# Patient Record
Sex: Female | Born: 1995 | Race: Black or African American | Hispanic: No | Smoking: Never smoker
Health system: Southern US, Community
[De-identification: ages and names within clinical notes are randomized; demographics above are authoritative.]

## PROBLEM LIST (undated history)

## (undated) DIAGNOSIS — D649 Anemia, unspecified: Secondary | ICD-10-CM

## (undated) DIAGNOSIS — K5090 Crohn's disease, unspecified, without complications: Secondary | ICD-10-CM

## (undated) DIAGNOSIS — U071 COVID-19: Secondary | ICD-10-CM

## (undated) DIAGNOSIS — E282 Polycystic ovarian syndrome: Secondary | ICD-10-CM

## (undated) HISTORY — PX: COLOSTOMY: SHX63

## (undated) HISTORY — PX: APPENDECTOMY: SHX54

## (undated) HISTORY — PX: CHOLECYSTECTOMY: SHX55

## (undated) HISTORY — PX: COLECTOMY: SHX59

## (undated) HISTORY — PX: COLOSTOMY REVERSAL: SHX5782

---
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
## 2014-09-30 DIAGNOSIS — J45909 Unspecified asthma, uncomplicated: Secondary | ICD-10-CM | POA: Insufficient documentation

## 2015-07-23 ENCOUNTER — Encounter: Payer: Self-pay | Admitting: *Deleted

## 2015-07-23 ENCOUNTER — Emergency Department
Admission: EM | Admit: 2015-07-23 | Discharge: 2015-07-23 | Disposition: A | Discharge status: Home / Self Care | Payer: Self-pay | Attending: Emergency Medicine | Admitting: Emergency Medicine

## 2015-07-23 DIAGNOSIS — K0889 Other specified disorders of teeth and supporting structures: Secondary | ICD-10-CM | POA: Insufficient documentation

## 2015-07-23 DIAGNOSIS — K029 Dental caries, unspecified: Secondary | ICD-10-CM | POA: Insufficient documentation

## 2015-07-23 HISTORY — DX: Crohn's disease, unspecified, without complications: K50.90

## 2015-07-23 MED ORDER — AMOXICILLIN 500 MG PO CAPS
500.0000 mg | ORAL_CAPSULE | Freq: Once | ORAL | Status: AC
  Administered 2015-07-23: 500 mg via ORAL
  Filled 2015-07-23: qty 1

## 2015-07-23 MED ORDER — TRAMADOL HCL 50 MG PO TABS: 50.0000 mg | ORAL_TABLET | Freq: Four times a day (QID) | ORAL | Status: DC | PRN

## 2015-07-23 MED ORDER — HYDROCODONE-ACETAMINOPHEN 5-325 MG PO TABS
1.0000 | ORAL_TABLET | Freq: Once | ORAL | Status: AC
  Administered 2015-07-23: 1 via ORAL
  Filled 2015-07-23: qty 1

## 2015-07-23 MED ORDER — AMOXICILLIN 500 MG PO TABS: 500.0000 mg | ORAL_TABLET | Freq: Three times a day (TID) | ORAL | Status: DC

## 2015-07-23 NOTE — ED Notes (Signed)
Pt reports dental pain x 4 days. Left side.

## 2015-07-23 NOTE — ED Provider Notes (Signed)
[facility] Emergency Department Provider Note  ____________________________________________  Time seen: Approximately 11:12 PM  I have reviewed the triage vital signs and the nursing notes.   HISTORY  Chief Complaint Dental Pain    HPI Sandra Wilkerson is a 19 y.o. female who presents to the emergency department for evaluation of dental pain. She states that she noticed the area becoming painful 3 days ago and it has then progressively worsening. She states that she feels that her lower jaw is swelling.She denies fever.   Past Medical History  Diagnosis Date  . Crohn disease (HCC)     There are no active problems to display for this patient.   Past Surgical History  Procedure Laterality Date  . Colectomy    . Appendectomy    . Colostomy    . Colostomy reversal      Current Outpatient Rx  Name  Route  Sig  Dispense  Refill  . amoxicillin (AMOXIL) 500 MG tablet   Oral   Take 1 tablet (500 mg total) by mouth 3 (three) times daily.   30 tablet   0   . traMADol (ULTRAM) 50 MG tablet   Oral   Take 1 tablet (50 mg total) by mouth every 6 (six) hours as needed.   9 tablet   0     Allergies Review of patient's allergies indicates no known allergies.  No family history on file.  Social History Social History  Substance Use Topics  . Smoking status: Never Smoker   . Smokeless tobacco: None  . Alcohol Use: No    Review of Systems Constitutional: No fever/chills Eyes: No visual changes. ENT: No sore throat. Cardiovascular: Denies chest pain. Respiratory: Denies shortness of breath. Gastrointestinal: No abdominal pain.  No nausea, no vomiting.  Genitourinary: Negative for dysuria. Musculoskeletal: Negative for back pain. Skin: Negative for rash. Neurological: Negative for headaches, focal weakness or numbness. 10-point ROS otherwise negative.  ____________________________________________   PHYSICAL EXAM:  VITAL SIGNS: ED  Triage Vitals  Enc Vitals Group     BP 07/23/15 2220 130/77 mmHg     Pulse Rate 07/23/15 2220 66     Resp 07/23/15 2220 16     Temp 07/23/15 2220 98.7 F (37.1 C)     Temp Source 07/23/15 2220 Oral     SpO2 07/23/15 2220 97 %     Weight 07/23/15 2220 160 lb (72.576 kg)     Height 07/23/15 2220  (1.499 m)     Head Cir --      Peak Flow --      Pain Score 07/23/15 2218 7     Pain Loc --      Pain Edu? --      Excl. in GC? --     Constitutional: Alert and oriented. Well appearing and in no acute distress. Eyes: Conjunctivae are normal. PERRL. EOMI. Head: Atraumatic. Nose: No congestion/rhinnorhea. Mouth/Throat: Mucous membranes are moist.  Oropharynx non-erythematous. Periodontal Exam    Neck: No stridor.  Hematological/Lymphatic/Immunilogical: No cervical lymphadenopathy. Cardiovascular:   Good peripheral circulation. Respiratory: Normal respiratory effort.  No retractions. Musculoskeletal: No lower extremity tenderness nor edema.  No joint effusions. Neurologic:  Normal speech and language. No gross focal neurologic deficits are appreciated. Speech is normal. No gait instability. Skin:  Skin is warm, dry and intact. No rash noted. Psychiatric: Mood and affect are normal. Speech and behavior are normal.  ____________________________________________   LABS (all labs ordered are listed, but  only abnormal results are displayed)  Labs Reviewed - No data to display ____________________________________________   RADIOLOGY   ____________________________________________   PROCEDURES  Procedure(s) performed: None  Critical Care performed: No  ____________________________________________   INITIAL IMPRESSION / ASSESSMENT AND PLAN / ED COURSE  Pertinent labs & imaging results that were available during my care of the patient were reviewed by me and considered in my medical decision making (see chart for details).  Patient was advised to see the dentist  within 14 days. Also advised to take the antibiotic until finished. Instructed to return to the ER for symptoms that change or worsen if you are unable to schedule an appointment. ____________________________________________   FINAL CLINICAL IMPRESSION(S) / ED DIAGNOSES  Final diagnoses:  Pain, dental       Chinita Pester, FNP 07/23/15 1610  Loleta Rose, MD 07/25/15 [phone]

## 2015-07-23 NOTE — ED Notes (Signed)
Pt was taking alieve 200 mg for pain. Lasted for the past 3 days

## 2015-07-23 NOTE — ED Notes (Signed)
pts left rear gum area is swollen for the last 3 days

## 2015-07-23 NOTE — Discharge Instructions (Signed)
OPTIONS FOR DENTAL FOLLOW UP CARE ° °[place] Department of Health and Human Services - Local Safety Net Dental Clinics °http://www.ncdhhs.gov/dph/oralhealth/services/safetynetclinics.htm °  °[facility] (336-562-3123) ° °[facility] [place] (919-933-9087) ° °[facility] (919-663-1744 ext [phone]) ° °[facility] ([phone]) ° °[facility] ([phone]) °This clinic caters to the indigent population and is on a lottery system. °Location: °[facility], [address], [place] °Clinic Hours: °Wednesdays from 6pm - 9pm, patients seen by a lottery system. °For dates, call or go to www.med.[facility].edu/[facility]/patients/Dental-[facility] °Services: °Cleanings, fillings and simple extractions. °Payment Options: °DENTAL WORK IS FREE OF CHARGE. Bring proof of income or support. °Best way to get seen: °Arrive at 5:15 pm - this is a lottery, NOT first come/first serve, so arriving earlier will not increase your chances of being seen. °  °  °[facility] °[phone] °Select option 1 for emergencies °  °Location: °[facility], [address], [place] °Clinic Hours: °No walk-ins accepted - call the day before to schedule an appointment. °Check in times are 9:30 am and 1:30 pm. °Services: °Simple extractions, temporary fillings, pulpectomy/pulp debridement, uncomplicated abscess drainage. °Payment Options: °PAYMENT IS DUE AT THE TIME OF SERVICE.  Fee is usually $100-200, additional surgical procedures (e.g. abscess drainage) may be extra. °Cash, checks, Visa/MasterCard accepted.  Can file Medicaid if patient is covered for dental - patient should call case worker to check. °No discount for [facility] patients. °Best way to get seen: °MUST call the day before and get onto the schedule. Can usually be seen the next 1-2 days. No walk-ins accepted. °  °  °[facility] °[phone] °   °Location: °[facility], [address], [place] °Clinic Hours: °M, W, Th, F 8am or 1:30pm, Tues 9a or 1:30 - first come/first served. °Services: °Simple extractions, temporary fillings, uncomplicated abscess drainage.  You do not need to be an [place] resident. °Payment Options: °PAYMENT IS DUE AT THE TIME OF SERVICE. °Dental insurance, otherwise sliding scale - bring proof of income or support. °Depending on income and treatment needed, cost is usually $50-200. °Best way to get seen: °Arrive early as it is first come/first served. °  °  °[facility] °[phone] °  °Location: °[address] °Clinic Hours: °Mon-Thu 8a-5p °Services: °Most basic dental services including extractions and fillings. °Payment Options: °PAYMENT IS DUE AT THE TIME OF SERVICE. °Sliding scale, up to 50% off - bring proof if income or support. °Medicaid with dental option accepted. °Best way to get seen: °Call to schedule an appointment, can usually be seen within 2 weeks OR they will try to see walk-ins - show up at 8a or 2p (you may have to wait). °  °  °[facility] °919-245-2435 °[place] RESIDENTS ONLY °  °Location: °[facility], [address], [place], [place] [address] °Clinic Hours: By appointment only. °Monday - Thursday 8am-5pm, Friday 8am-12pm °Services: Cleanings, fillings, extractions. °Payment Options: °PAYMENT IS DUE AT THE TIME OF SERVICE. °Cash, Visa or MasterCard. Sliding scale - $30 minimum per service. °Best way to get seen: °Come in to office, complete packet and make an appointment - need proof of income °or support monies for each household member and proof of [place] residence. °Usually takes about a month to get in. °  °  °[facility] °[phone] °  °Location: °[address]   [place] °Clinic Hours: Walk-in Urgent Care Dental Services are offered Monday-Friday  mornings only. °The numbers of emergencies accepted daily is limited to the number of °providers available. °Maximum 15 - Mondays, Wednesdays & Thursdays °Maximum 10 - Tuesdays & Fridays °Services: °You do not need to be a [place] resident to be seen for a dental emergency. °Emergencies are defined as pain, swelling, abnormal bleeding, or dental trauma. Walkins will receive x-rays if needed. °NOTE: Dental cleaning is not an emergency. °Payment Options: °PAYMENT IS DUE AT THE TIME OF SERVICE. °Minimum co-pay is $40.00 for uninsured patients. °Minimum co-pay is $3.00 for Medicaid with dental coverage. °Dental Insurance is accepted and must be presented at time of visit. °Medicare does not cover dental. °Forms of payment: Cash, credit card, checks. °Best way to get seen: °If not previously registered with the clinic, walk-in dental registration begins at 7:15 am and is on a first come/first serve basis. °If previously registered with the clinic, call to make an appointment. °  °  °[facility] °[phone] °[place] RESIDENTS ONLY °  °Location: °[address], [place], [place] °Clinic Hours: °Mon-Thu 10a-2p °Services: Extractions only! °Payment Options: °FREE (donations accepted) - bring proof of income or support °Best way to get seen: °Call and schedule an appointment OR come at 8am on the 1st Monday of every month (except for holidays) when it is first come/first served. °  °  °[facility] °[phone] °  °Location: °[address], [place] °Clinic Hours: °Friday mornings °Services, Payment Options, Best way to get seen: °Call for info °

## 2017-03-26 DIAGNOSIS — G4489 Other headache syndrome: Secondary | ICD-10-CM | POA: Insufficient documentation

## 2017-03-26 DIAGNOSIS — K219 Gastro-esophageal reflux disease without esophagitis: Secondary | ICD-10-CM | POA: Insufficient documentation

## 2017-09-12 DIAGNOSIS — K5010 Crohn's disease of large intestine without complications: Secondary | ICD-10-CM | POA: Insufficient documentation

## 2017-11-24 ENCOUNTER — Encounter: Payer: Self-pay | Admitting: Gynecology

## 2017-11-24 ENCOUNTER — Ambulatory Visit
Admission: EM | Admit: 2017-11-24 | Discharge: 2017-11-24 | Disposition: A | Discharge status: Home / Self Care | Payer: BLUE CROSS/BLUE SHIELD | Attending: Emergency Medicine | Admitting: Emergency Medicine

## 2017-11-24 ENCOUNTER — Other Ambulatory Visit: Payer: Self-pay

## 2017-11-24 DIAGNOSIS — J029 Acute pharyngitis, unspecified: Secondary | ICD-10-CM

## 2017-11-24 LAB — RAPID INFLUENZA A&B ANTIGENS
Influenza A (ARMC): NEGATIVE
Influenza B (ARMC): NEGATIVE

## 2017-11-24 LAB — RAPID STREP SCREEN (MED CTR MEBANE ONLY): Streptococcus, Group A Screen (Direct): NEGATIVE

## 2017-11-24 MED ORDER — AMOXICILLIN-POT CLAVULANATE 875-125 MG PO TABS: 1.0000 | ORAL_TABLET | Freq: Two times a day (BID) | ORAL | 0 refills | Status: DC

## 2017-11-24 MED ORDER — BENZONATATE 200 MG PO CAPS: ORAL_CAPSULE | ORAL | 0 refills | Status: DC

## 2017-11-24 NOTE — ED Triage Notes (Signed)
Patient c/o sore throat x 4 days. Per patient her sister at home diagnose with the flu and strep throat.

## 2017-11-24 NOTE — ED Provider Notes (Signed)
[facility]    CSN: [id number] Arrival date & time: 11/24/17  4098     History   Chief Complaint Chief Complaint  Patient presents with  . Sore Throat    HPI Sandra Wilkerson is a 22 y.o. female.   HPI  22 year old female who presents with a sore throat that she has had for 4 days.  Has a cough is nonproductive.  Prior to her present symptoms she has had nausea and vomiting that has since abated.  She lives with her sister and her children and states that they have all been diagnosed with flu or strep throat.  She was concerned that she may have also caught illness.  She did have her flu shot earlier this year.  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  Has had some difficulty in swallowing mostly last night.        Past Medical History:  Diagnosis Date  . Crohn disease (HCC)     There are no active problems to display for this patient.   Past Surgical History:  Procedure Laterality Date  . APPENDECTOMY    . COLECTOMY    . COLOSTOMY    . COLOSTOMY REVERSAL      OB History    No data available       Home Medications    Prior to Admission medications   Medication Sig Start Date End Date Taking? Authorizing Provider  abciximab 7.2 mg in sodium chloride 0.9 % 250 mL Inject into the vein continuous.   Yes [provider]  amoxicillin-clavulanate (AUGMENTIN) 875-125 MG tablet Take 1 tablet by mouth every 12 (twelve) hours. 11/24/17   Lutricia Feil, PA-C  benzonatate (TESSALON) 200 MG capsule Take one cap TID PRN cough 11/24/17   Lutricia Feil, PA-C    Family History Family History  Problem Relation Age of Onset  . Asthma Father     Social History Social History   Tobacco Use  . Smoking status: Never Smoker  . Smokeless tobacco: Never Used  Substance Use Topics  . Alcohol use: No  . Drug use: No     Allergies   Patient has no known allergies.   Review of Systems Review of Systems    Constitutional: Positive for activity change and appetite change. Negative for chills, fatigue and fever.  HENT: Positive for congestion and sore throat.   Respiratory: Positive for cough.      Physical Exam Triage Vital Signs ED Triage Vitals  Enc Vitals Group     BP 11/24/17 0927 115/75     Pulse Rate 11/24/17 0927 72     Resp 11/24/17 0927 16     Temp 11/24/17 0927 98.4 F (36.9 C)     Temp Source 11/24/17 0927 Oral     SpO2 11/24/17 0927 100 %     Weight 11/24/17 0929 179 lb (81.2 kg)     Height 11/24/17 0929 4\' 11"  (1.499 m)     Head Circumference --      Peak Flow --      Pain Score 11/24/17 0928 6     Pain Loc --      Pain Edu? --      Excl. in GC? --    No data found.  Updated Vital Signs BP 115/75 (BP Location: Left Arm)   Pulse 72   Temp 98.4 F (36.9 C) (Oral)   Resp 16   Ht 4'  11" (1.499 m)   Wt 179 lb (81.2 kg)   LMP 10/31/2017   SpO2 100%   BMI 36.15 kg/m   Visual Acuity Right Eye Distance:   Left Eye Distance:   Bilateral Distance:    Right Eye Near:   Left Eye Near:    Bilateral Near:     Physical Exam  Constitutional: She is oriented to person, place, and time. She appears well-developed and well-nourished.  Non-toxic appearance. She does not appear ill. No distress.  HENT:  Head: Normocephalic.  Right Ear: Tympanic membrane normal. No drainage, swelling or tenderness.  Left Ear: Tympanic membrane normal. No drainage, swelling or tenderness.  Mouth/Throat: Uvula is midline and mucous membranes are normal. No uvula swelling. Oropharyngeal exudate and posterior oropharyngeal edema present. No posterior oropharyngeal erythema or tonsillar abscesses. Tonsils are 1+ on the right. Tonsils are 3+ on the left. Tonsillar exudate.  Eyes: Pupils are equal, round, and reactive to light.  Neck: Normal range of motion.  Pulmonary/Chest: Effort normal and breath sounds normal.  Neurological: She is alert and oriented to person, place, and time.  Skin:  Skin is warm and dry.  Psychiatric: She has a normal mood and affect. Her behavior is normal.  Nursing note and vitals reviewed.    UC Treatments / Results  Labs (all labs ordered are listed, but only abnormal results are displayed) Labs Reviewed  RAPID STREP SCREEN (NOT AT [facility])  RAPID INFLUENZA A&B ANTIGENS ([facility] ONLY)  CULTURE, GROUP A STREP [facility])    EKG  EKG Interpretation None       Radiology No results found.  Procedures Procedures (including critical care time)  Medications Ordered in UC Medications - No data to display   Initial Impression / Assessment and Plan / UC Course  I have reviewed the triage vital signs and the nursing notes.  Pertinent labs & imaging results that were available during my care of the patient were reviewed by me and considered in my medical decision making (see chart for details).     Plan: 1. Test/x-ray results and diagnosis reviewed with patient 2. rx as per orders; risks, benefits, potential side effects reviewed with patient 3. Recommend supportive treatment with emperic use of Augmentin for the tonsils swelling and posterior to strep.  There is and sensitivities will be available in 48 hours.  She worsens I recommended that she go to the emergency room. 4. F/u prn if symptoms worsen or don't improve   Final Clinical Impressions(s) / UC Diagnoses   Final diagnoses:  Acute pharyngitis, unspecified etiology    ED Discharge Orders        Ordered    amoxicillin-clavulanate (AUGMENTIN) 875-125 MG tablet  Every 12 hours     11/24/17 1010    benzonatate (TESSALON) 200 MG capsule     11/24/17 1010       Controlled Substance Prescriptions [place] Controlled Substance Registry consulted? Not Applicable   Lutricia Feil, PA-C 11/24/17 1018

## 2017-11-27 LAB — CULTURE, GROUP A STREP (THRC)

## 2018-02-26 DIAGNOSIS — N62 Hypertrophy of breast: Secondary | ICD-10-CM | POA: Insufficient documentation

## 2018-02-26 DIAGNOSIS — M546 Pain in thoracic spine: Secondary | ICD-10-CM | POA: Insufficient documentation

## 2018-02-26 DIAGNOSIS — G8929 Other chronic pain: Secondary | ICD-10-CM | POA: Insufficient documentation

## 2018-02-26 DIAGNOSIS — M542 Cervicalgia: Secondary | ICD-10-CM | POA: Insufficient documentation

## 2018-03-04 ENCOUNTER — Other Ambulatory Visit: Payer: Self-pay

## 2018-03-04 ENCOUNTER — Emergency Department: Payer: BLUE CROSS/BLUE SHIELD

## 2018-03-04 ENCOUNTER — Emergency Department
Admission: EM | Admit: 2018-03-04 | Discharge: 2018-03-04 | Disposition: A | Discharge status: Home / Self Care | Payer: BLUE CROSS/BLUE SHIELD | Attending: Emergency Medicine | Admitting: Emergency Medicine

## 2018-03-04 ENCOUNTER — Encounter: Payer: Self-pay | Admitting: Emergency Medicine

## 2018-03-04 DIAGNOSIS — Z79899 Other long term (current) drug therapy: Secondary | ICD-10-CM | POA: Diagnosis not present

## 2018-03-04 DIAGNOSIS — J9801 Acute bronchospasm: Secondary | ICD-10-CM | POA: Insufficient documentation

## 2018-03-04 DIAGNOSIS — R0602 Shortness of breath: Secondary | ICD-10-CM | POA: Diagnosis present

## 2018-03-04 DIAGNOSIS — J069 Acute upper respiratory infection, unspecified: Secondary | ICD-10-CM | POA: Diagnosis not present

## 2018-03-04 LAB — BASIC METABOLIC PANEL
Anion gap: 5 (ref 5–15)
BUN: 13 mg/dL (ref 6–20)
CO2: 23 mmol/L (ref 22–32)
Calcium: 8.6 mg/dL — ABNORMAL LOW (ref 8.9–10.3)
Chloride: 108 mmol/L (ref 101–111)
Creatinine, Ser: 0.71 mg/dL (ref 0.44–1.00)
GFR calc non Af Amer: >60 mL/min (ref >60)
Glucose, Bld: 102 mg/dL — ABNORMAL HIGH (ref 65–99)
Potassium: 3.8 mmol/L (ref 3.5–5.1)
Sodium: 136 mmol/L (ref 135–145)

## 2018-03-04 LAB — POC URINE PREG, ED: Preg Test, Ur: NEGATIVE

## 2018-03-04 LAB — CBC
HCT: 32 % — ABNORMAL LOW (ref 35.0–47.0)
Hemoglobin: 10.2 g/dL — ABNORMAL LOW (ref 12.0–16.0)
MCH: 22.1 pg — ABNORMAL LOW (ref 26.0–34.0)
MCHC: 31.9 g/dL — ABNORMAL LOW (ref 32.0–36.0)
MCV: 69.3 fL — AB (ref 80.0–100.0)
Platelets: 367 10*3/uL (ref 150–440)
RBC: 4.63 MIL/uL (ref 3.80–5.20)
RDW: 15.7 % — AB (ref 11.5–14.5)
WBC: 9.5 10*3/uL (ref 3.6–11.0)

## 2018-03-04 LAB — TROPONIN I: Troponin I: <0.03 ng/mL (ref <0.03)

## 2018-03-04 MED ORDER — ALBUTEROL SULFATE HFA 108 (90 BASE) MCG/ACT IN AERS: 2.0000 | INHALATION_SPRAY | Freq: Four times a day (QID) | RESPIRATORY_TRACT | 2 refills | Status: DC | PRN

## 2018-03-04 MED ORDER — IPRATROPIUM-ALBUTEROL 0.5-2.5 (3) MG/3ML IN SOLN
3.0000 mL | Freq: Once | RESPIRATORY_TRACT | Status: AC
  Administered 2018-03-04: 3 mL via RESPIRATORY_TRACT
  Filled 2018-03-04: qty 3

## 2018-03-04 NOTE — ED Notes (Signed)
ED Provider at bedside. 

## 2018-03-04 NOTE — ED Triage Notes (Signed)
Pt comes into the ED via POV c/o chest pain x 2 days and increased shortness of breath that started last night.  Patient also has a cough present.  Patient has even and unlabored respirations at this time.  Denies any N/V, dizziness, or radiating pain.

## 2018-03-04 NOTE — ED Provider Notes (Signed)
[facility] Emergency Department Provider Note   ____________________________________________    I have reviewed the triage vital signs and the nursing notes.   HISTORY  Chief Complaint Cough, mild shortness of breath    HPI Sandra Wilkerson is a 22 y.o. female who presents with complaints of cough and mild chest tightness with some shortness of breath as well.  Patient reports she has had a cough for several days.  Over the last 24 hours has developed mild chest tightness and has felt somewhat short of breath.  She does have a history of asthma but has not had an attack since she was a teenager.  Denies fevers or chills.  No recent travel.  No calf pain or swelling.  No history of blood clots.  No other medical conditions   Past Medical History:  Diagnosis Date  . Crohn disease (HCC)     There are no active problems to display for this patient.   Past Surgical History:  Procedure Laterality Date  . APPENDECTOMY    . COLECTOMY    . COLOSTOMY    . COLOSTOMY REVERSAL      Prior to Admission medications   Medication Sig Start Date End Date Taking? Authorizing Provider  abciximab 7.2 mg in sodium chloride 0.9 % 250 mL Inject into the vein continuous.    [provider]  albuterol (PROVENTIL HFA;VENTOLIN HFA) 108 (90 Base) MCG/ACT inhaler Inhale 2 puffs into the lungs every 6 (six) hours as needed for wheezing or shortness of breath. 03/04/18   Jene Every, MD  amoxicillin-clavulanate (AUGMENTIN) 875-125 MG tablet Take 1 tablet by mouth every 12 (twelve) hours. 11/24/17   Lutricia Feil, PA-C  benzonatate (TESSALON) 200 MG capsule Take one cap TID PRN cough 11/24/17   Lutricia Feil, PA-C     Allergies Patient has no known allergies.  Family History  Problem Relation Age of Onset  . Asthma Father     Social History Social History   Tobacco Use  . Smoking status: Never Smoker  . Smokeless tobacco: Never Used  Substance  Use Topics  . Alcohol use: No  . Drug use: No    Review of Systems  Constitutional: No fever/chills Eyes: No visual changes.  ENT: No sore throat. Cardiovascular: As above Respiratory: As above Gastrointestinal: No abdominal pain.  No nausea, no vomiting.   Genitourinary: Negative for dysuria. Musculoskeletal: Negative for back pain. Skin: Negative for rash. Neurological: Negative for headache   ____________________________________________   PHYSICAL EXAM:  VITAL SIGNS: ED Triage Vitals  Enc Vitals Group     BP 03/04/18 1113 (!) 96/55     Pulse Rate 03/04/18 1113 82     Resp 03/04/18 1113 16     Temp 03/04/18 1113 98.8 F (37.1 C)     Temp Source 03/04/18 1113 Oral     SpO2 03/04/18 1113 97 %     Weight 03/04/18 1110 81.2 kg (179 lb)     Height 03/04/18 1110 1.499 m (4\' 11" )     Head Circumference --      Peak Flow --      Pain Score 03/04/18 1110 10     Pain Loc --      Pain Edu? --      Excl. in GC? --     Constitutional: Alert and oriented. No acute distress. Pleasant and interactive Eyes: Conjunctivae are normal.   Nose: No congestion/rhinnorhea. Mouth/Throat: Mucous membranes are moist.  Neck:  Painless ROM Cardiovascular: Normal rate, regular rhythm. Grossly normal heart sounds.  Good peripheral circulation. Respiratory: Normal respiratory effort.  No retractions.  Scattered mild wheezes Gastrointestinal: Soft and nontender. No distention.  No CVA tenderness. Genitourinary: deferred Musculoskeletal: No lower extremity tenderness nor edema.  Warm and well perfused Neurologic:  Normal speech and language. No gross focal neurologic deficits are appreciated.  Skin:  Skin is warm, dry and intact. No rash noted. Psychiatric: Mood and affect are normal. Speech and behavior are normal.  ____________________________________________   LABS (all labs ordered are listed, but only abnormal results are displayed)  Labs Reviewed  BASIC METABOLIC PANEL -  Abnormal; Notable for the following components:      Result Value   Glucose, Bld 102 (*)    Calcium 8.6 (*)    All other components within normal limits  CBC - Abnormal; Notable for the following components:   Hemoglobin 10.2 (*)    HCT 32.0 (*)    MCV 69.3 (*)    MCH 22.1 (*)    MCHC 31.9 (*)    RDW 15.7 (*)    All other components within normal limits  TROPONIN I  POC URINE PREG, ED   ____________________________________________  EKG  ED ECG REPORT I, Jene Every, the attending physician, personally viewed and interpreted this ECG.  Date: 03/04/2018  Rhythm: normal sinus rhythm QRS Axis: normal Intervals: normal ST/T Wave abnormalities: normal Narrative Interpretation: no evidence of acute ischemia  ____________________________________________  RADIOLOGY  Chest x-ray normal, no pneumonia ____________________________________________   PROCEDURES  Procedure(s) performed: No  Procedures   Critical Care performed: No ____________________________________________   INITIAL IMPRESSION / ASSESSMENT AND PLAN / ED COURSE  Pertinent labs & imaging results that were available during my care of the patient were reviewed by me and considered in my medical decision making (see chart for details).  Patient well-appearing in no acute distress.  Symptoms most consistent with viral upper respiratory infection likely with mild bronchospasm given wheezing on exam.  Treated with DuoNeb with complete resolution of chest tightness and shortness of breath.  Will Rx albuterol inhaler have the patient follow-up closely with PCP to ensure complete resolution of symptoms.  Return precautions discussed    ____________________________________________   FINAL CLINICAL IMPRESSION(S) / ED DIAGNOSES  Final diagnoses:  Viral upper respiratory tract infection  Bronchospasm        Note:  This document was prepared using Dragon voice recognition software and may include  unintentional dictation errors.    Jene Every, MD 03/04/18 [phone]

## 2018-03-13 DIAGNOSIS — E611 Iron deficiency: Secondary | ICD-10-CM | POA: Insufficient documentation

## 2018-03-20 ENCOUNTER — Other Ambulatory Visit: Payer: Self-pay

## 2018-03-20 ENCOUNTER — Emergency Department
Admission: EM | Admit: 2018-03-20 | Discharge: 2018-03-20 | Disposition: A | Discharge status: Home / Self Care | Payer: BLUE CROSS/BLUE SHIELD | Attending: Emergency Medicine | Admitting: Emergency Medicine

## 2018-03-20 ENCOUNTER — Emergency Department: Payer: BLUE CROSS/BLUE SHIELD

## 2018-03-20 ENCOUNTER — Encounter: Payer: Self-pay | Admitting: Emergency Medicine

## 2018-03-20 DIAGNOSIS — Y939 Activity, unspecified: Secondary | ICD-10-CM | POA: Insufficient documentation

## 2018-03-20 DIAGNOSIS — Y999 Unspecified external cause status: Secondary | ICD-10-CM | POA: Diagnosis not present

## 2018-03-20 DIAGNOSIS — W500XXA Accidental hit or strike by another person, initial encounter: Secondary | ICD-10-CM | POA: Diagnosis not present

## 2018-03-20 DIAGNOSIS — S0512XA Contusion of eyeball and orbital tissues, left eye, initial encounter: Secondary | ICD-10-CM

## 2018-03-20 DIAGNOSIS — H5712 Ocular pain, left eye: Secondary | ICD-10-CM

## 2018-03-20 DIAGNOSIS — Y929 Unspecified place or not applicable: Secondary | ICD-10-CM | POA: Diagnosis not present

## 2018-03-20 DIAGNOSIS — S0012XA Contusion of left eyelid and periocular area, initial encounter: Secondary | ICD-10-CM | POA: Insufficient documentation

## 2018-03-20 DIAGNOSIS — S058X2A Other injuries of left eye and orbit, initial encounter: Secondary | ICD-10-CM | POA: Diagnosis present

## 2018-03-20 MED ORDER — IBUPROFEN 600 MG PO TABS: 600.0000 mg | ORAL_TABLET | Freq: Three times a day (TID) | ORAL | 0 refills | Status: DC | PRN

## 2018-03-20 MED ORDER — OXYCODONE-ACETAMINOPHEN 5-325 MG PO TABS: 1.0000 | ORAL_TABLET | ORAL | 0 refills | Status: DC | PRN

## 2018-03-20 MED ORDER — TETRACAINE HCL 0.5 % OP SOLN
2.0000 [drp] | Freq: Once | OPHTHALMIC | Status: AC
  Administered 2018-03-20: 2 [drp] via OPHTHALMIC
  Filled 2018-03-20: qty 4

## 2018-03-20 MED ORDER — FLUORESCEIN SODIUM 1 MG OP STRP
1.0000 | ORAL_STRIP | Freq: Once | OPHTHALMIC | Status: AC
  Administered 2018-03-20: 1 via OPHTHALMIC
  Filled 2018-03-20: qty 1

## 2018-03-20 MED ORDER — OXYCODONE-ACETAMINOPHEN 5-325 MG PO TABS
1.0000 | ORAL_TABLET | Freq: Once | ORAL | Status: AC
  Administered 2018-03-20: 1 via ORAL
  Filled 2018-03-20: qty 1

## 2018-03-20 MED ORDER — ONDANSETRON 4 MG PO TBDP
4.0000 mg | ORAL_TABLET | Freq: Once | ORAL | Status: AC
  Administered 2018-03-20: 4 mg via ORAL
  Filled 2018-03-20: qty 1

## 2018-03-20 NOTE — ED Notes (Signed)
Pt reports that she was playing with her kids this weekend when her child's head hit her in her left eye. Pt alert and oriented x 4. Family at the bedside.

## 2018-03-20 NOTE — ED Provider Notes (Signed)
[facility] Emergency Department Provider Note   ____________________________________________   First MD Initiated Contact with Patient 03/20/18 [phone]     (approximate)  I have reviewed the triage vital signs and the nursing notes.   HISTORY  Chief Complaint Eye Problem    HPI Sandra Wilkerson is a 22 y.o. female who presents to the ED from home with a chief complaint of left eye pain.  States 4 days ago she was accidentally struck in her left eye by the back of her child's head.  Denies LOC.  Complains of increasing pain and light sensitivity since.  Does not wear corrective lenses.  Denies blurry or double vision.  Denies associated dizziness, nausea or vomiting.  Denies fever, chills, chest pain, shortness of breath.   Past Medical History:  Diagnosis Date  . Crohn disease (HCC)     There are no active problems to display for this patient.   Past Surgical History:  Procedure Laterality Date  . APPENDECTOMY    . COLECTOMY    . COLOSTOMY    . COLOSTOMY REVERSAL      Prior to Admission medications   Medication Sig Start Date End Date Taking? Authorizing Provider  abciximab 7.2 mg in sodium chloride 0.9 % 250 mL Inject into the vein continuous.    [provider]  albuterol (PROVENTIL HFA;VENTOLIN HFA) 108 (90 Base) MCG/ACT inhaler Inhale 2 puffs into the lungs every 6 (six) hours as needed for wheezing or shortness of breath. 03/04/18   Jene Every, MD  amoxicillin-clavulanate (AUGMENTIN) 875-125 MG tablet Take 1 tablet by mouth every 12 (twelve) hours. 11/24/17   Lutricia Feil, PA-C  benzonatate (TESSALON) 200 MG capsule Take one cap TID PRN cough 11/24/17   Lutricia Feil, PA-C    Allergies Patient has no known allergies.  Family History  Problem Relation Age of Onset  . Asthma Father     Social History Social History   Tobacco Use  . Smoking status: Never Smoker  . Smokeless tobacco: Never Used  Substance Use  Topics  . Alcohol use: No  . Drug use: No    Review of Systems  Constitutional: No fever/chills Eyes: Positive for left eye pain and light sensitivity.  No visual changes. ENT: No sore throat. Cardiovascular: Denies chest pain. Respiratory: Denies shortness of breath. Gastrointestinal: No abdominal pain.  No nausea, no vomiting.  No diarrhea.  No constipation. Genitourinary: Negative for dysuria. Musculoskeletal: Negative for back pain. Skin: Negative for rash. Neurological: Negative for headaches, focal weakness or numbness.   ____________________________________________   PHYSICAL EXAM:  VITAL SIGNS: ED Triage Vitals  Enc Vitals Group     BP 03/20/18 0423 (!) 140/96     Pulse Rate 03/20/18 0423 88     Resp 03/20/18 0423 20     Temp 03/20/18 0423 97.9 F (36.6 C)     Temp Source 03/20/18 0423 Oral     SpO2 03/20/18 0423 100 %     Weight 03/20/18 0420 179 lb (81.2 kg)     Height 03/20/18 0420 4\' 11"  (1.499 m)     Head Circumference --      Peak Flow --      Pain Score 03/20/18 0420 10     Pain Loc --      Pain Edu? --      Excl. in GC? --     Constitutional: Alert and oriented. Well appearing and in moderate acute distress.  Tearful. Eyes:  Left conjunctivae reddened.  Patient refused to open left eye for visual acuity in triage.  PERRL. EOMI. Tetracaine drops applied and to left eye was stained with floor seen and examined under Woods lamp.  No corneal abrasion seen. Head: Atraumatic.  Left periorbital and cheek area tender to palpation without crepitus. Nose: No injury noted. Mouth/Throat: Mucous membranes are moist.  No dental malocclusion. Neck: No stridor.  No cervical spine tenderness to palpation. Cardiovascular: Normal rate, regular rhythm. Grossly normal heart sounds.  Good peripheral circulation. Respiratory: Normal respiratory effort.  No retractions. Lungs CTAB. Gastrointestinal: Soft and nontender. No distention. No abdominal bruits. No CVA  tenderness. Musculoskeletal: No lower extremity tenderness nor edema.  No joint effusions. Neurologic:  Normal speech and language. No gross focal neurologic deficits are appreciated. No gait instability. Skin:  Skin is warm, dry and intact. No rash noted. Psychiatric: Mood and affect are normal. Speech and behavior are normal.  ____________________________________________   LABS (all labs ordered are listed, but only abnormal results are displayed)  Labs Reviewed - No data to display ____________________________________________  EKG  None ____________________________________________  RADIOLOGY  ED MD interpretation: Unremarkable CT scan  Official radiology report(s): Ct Maxillofacial Wo Cm  Result Date: 03/20/2018 CLINICAL DATA:  Recent left eye injury.  Photophobia. EXAM: CT MAXILLOFACIAL WITHOUT CONTRAST TECHNIQUE: Multidetector CT imaging of the maxillofacial structures was performed. Multiplanar CT image reconstructions were also generated. COMPARISON:  None. FINDINGS: Osseous: --Complex facial fracture types: No LeFort, zygomaticomaxillary complex or nasoorbitoethmoidal fracture. --Simple fracture types: None. --Mandible, hard palate and teeth: No acute abnormality. Orbits: The globes are intact. Normal appearance of the intra- and extraconal fat. Symmetric extraocular muscles. Sinuses: No fluid levels or advanced mucosal thickening. Soft tissues: Normal visualized extracranial soft tissues. Limited intracranial: Normal. IMPRESSION: Normal appearance of the face and orbits. Electronically Signed   By: Deatra Robinson M.D.   On: 03/20/2018 06:04    ____________________________________________   PROCEDURES  Procedure(s) performed: None  Procedures  Critical Care performed: No  ____________________________________________   INITIAL IMPRESSION / ASSESSMENT AND PLAN / ED COURSE  As part of my medical decision making, I reviewed the following data within the electronic  MEDICAL RECORD NUMBER History obtained from family, Nursing notes reviewed and incorporated, Radiograph reviewed and Notes from prior ED visits   23 year old female who presents with left eye pain and light sensitivity 4 days after being accidentally struck.  Even with tetracaine drops, patient proved to be a difficult exam because she is uncooperative and unwilling to open her left eye.  There is minimal light in the room due to light sensitivity.  Unable to visualize presence or absence of hyphema.  Globe appears to be intact.  Will administer Percocet for pain and reexamine left eye once patient is more comfortable.  Will obtain CT maxillofacial to evaluate for globin facial injuries.  Will reattempt visual acuity once patient is more comfortable.   Clinical Course as of Mar 21 703  Wed Mar 20, 2018  0702 Resting in no acute distress.  No hyphema noted on reexamination.  CT is unremarkable.  Repeat visual acuity equal in both eyes.  Will discharge home on Motrin and Percocet for pain with close ophthalmology follow-up.  Strict return precautions given.  Patient verbalizes understanding and agrees with plan of care.   [JS]    Clinical Course User Index [JS] Irean Hong, MD     ____________________________________________   FINAL CLINICAL IMPRESSION(S) / ED DIAGNOSES  Final diagnoses:  Left  eye pain  Eye contusion, left, initial encounter     ED Discharge Orders    None       Note:  This document was prepared using Dragon voice recognition software and may include unintentional dictation errors.    Irean Hong, MD 03/20/18 [phone]

## 2018-03-20 NOTE — Discharge Instructions (Addendum)
1.  You may take pain medicines as needed (Motrin/Percocet #15). 2.  Please call the number provided to schedule a close follow-up appointment with the eye doctor. 3.  Return to the ER for worsening symptoms, persistent vomiting, vision changes or other concerns.

## 2018-03-20 NOTE — ED Notes (Signed)
Pt ambulatory upon discharge; declined wheel chair. Verbalized understanding of discharge instructions, follow-up care and prescriptions. VSS. Skin warm and dry. A&O x4.  

## 2018-03-20 NOTE — ED Triage Notes (Signed)
Patient ambulatory to triage with steady gait, without difficulty or distress noted; pt st on Saturday child hit her left eye; c/o increasing pain & light sensitivity

## 2019-05-27 ENCOUNTER — Telehealth: Payer: Self-pay | Admitting: Obstetrics and Gynecology

## 2019-05-27 NOTE — Telephone Encounter (Signed)
The patient called and stated that a referral was sent over by [facility], and she has not heard anything from our office to schedule. She was advised to call office. Pt left preferred contact [phone]. Please advise.

## 2019-05-27 NOTE — Telephone Encounter (Signed)
Spoke with patient and scheduled appointment.

## 2019-06-04 ENCOUNTER — Ambulatory Visit (INDEPENDENT_AMBULATORY_CARE_PROVIDER_SITE_OTHER): Payer: BC Managed Care – PPO | Admitting: Obstetrics and Gynecology

## 2019-06-04 ENCOUNTER — Other Ambulatory Visit: Payer: Self-pay

## 2019-06-04 ENCOUNTER — Encounter: Payer: Self-pay | Admitting: Obstetrics and Gynecology

## 2019-06-04 VITALS — BP 134/87 | HR 102 | Ht 59.0 in | Wt 183.4 lb

## 2019-06-04 DIAGNOSIS — N914 Secondary oligomenorrhea: Secondary | ICD-10-CM

## 2019-06-04 NOTE — Progress Notes (Signed)
HPI:      [name] is a 23 y.o. No obstetric history on file. who LMP was Patient's last menstrual period was 05/19/2019.  Subjective:   G0.  She presents today with complaint of irregular menstrual cycles.  She states her cycles have been every 2 to 3 months often skipping cycles.  In addition she has been having unprotected intercourse for greater than 3 years without resultant pregnancy.  She has been actively trying to get pregnant over the last year. Patient had normal cycles starting at age 36 until age 40.  She began OCPs at that time over the next 2 to 3 years took OCPs without incident.  After discontinuation of OCPs her cycles became irregular as described above. Of significant note her partner has an 46-year-old child.    Hx: The following portions of the patient's history were reviewed and updated as appropriate:             She  has a past medical history of Crohn disease ([place]). She does not have a problem list on file. She  has a past surgical history that includes Colectomy; Appendectomy; Colostomy; and Colostomy reversal. Her family history includes Asthma in her father. She  reports that she has never smoked. She has never used smokeless tobacco. She reports that she does not drink alcohol or use drugs. She has a current medication list which includes the following prescription(s): infliximab. She has No Known Allergies.       Review of Systems:  Review of Systems  Constitutional: Denied constitutional symptoms, night sweats, recent illness, fatigue, fever, insomnia and weight loss.  Eyes: Denied eye symptoms, eye pain, photophobia, vision change and visual disturbance.  Ears/Nose/Throat/Neck: Denied ear, nose, throat or neck symptoms, hearing loss, nasal discharge, sinus congestion and sore throat.  Cardiovascular: Denied cardiovascular symptoms, arrhythmia, chest pain/pressure, edema, exercise intolerance, orthopnea and palpitations.  Respiratory: Denied  pulmonary symptoms, asthma, pleuritic pain, productive sputum, cough, dyspnea and wheezing.  Gastrointestinal:  Patient has Crohn's disease currently controlled with medication.  Genitourinary: See HPI for additional information.  Musculoskeletal: Denied musculoskeletal symptoms, stiffness, swelling, muscle weakness and myalgia.  Dermatologic: Denied dermatology symptoms, rash and scar.  Neurologic: Denied neurology symptoms, dizziness, headache, neck pain and syncope.  Psychiatric: Denied psychiatric symptoms, anxiety and depression.  Endocrine: Denied endocrine symptoms including hot flashes and night sweats.   Meds:   Current Outpatient Medications on File Prior to Visit  Medication Sig Dispense Refill  . inFLIXimab (REMICADE) 100 MG injection Inject into the vein.     No current facility-administered medications on file prior to visit.     Objective:     Vitals:   06/04/19 0928  BP: 134/87  Pulse: (!) 102                Assessment:    No obstetric history on file. There are no active problems to display for this patient.    1. Secondary oligomenorrhea     Patient likely not ovulating each month resulting in irregular cycles.  Clinical diagnosis of PCO without lab work at this time.  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   Plan:            1.  PCO labs -we will base management on PCO labs when they return. Orders Orders Placed This Encounter  Procedures  . DHEA-sulfate  . FSH/LH  . Glucose, fasting  . Insulin, random  .  Prolactin  . Testosterone, Free, Total, SHBG  . TSH    No orders of the defined types were placed in this encounter.     F/U  Return for We will contact her with any abnormal test results. I spent 32 minutes involved in the care of this patient of which greater than 50% was spent discussing irregular menses, ovulation, history of menstrual cycles and OCPs, diagnosis of PCO versus  other oligo ovulatory causes, female versus female infertility, timing of intercourse, ovulation prediction using testing.  All questions answered  Elonda Huskyavid J. Japji Kok, M.D. 06/04/2019 9:57 AM

## 2019-06-04 NOTE — Progress Notes (Signed)
Patient comes in today for new patient appointment. She is having heavy bleeding during her cycle and irregular cycles. She would also like to discuss her getting pregnant.

## 2019-06-06 LAB — DHEA-SULFATE: DHEA-SO4: 223 ug/dL (ref 84.8–378.0)

## 2019-06-06 LAB — TESTOSTERONE, FREE, TOTAL, SHBG
Sex Hormone Binding: 27.8 nmol/L (ref 24.6–122.0)
Testosterone, Free: 2.6 pg/mL (ref 0.0–4.2)
Testosterone: 44 ng/dL (ref 8–48)

## 2019-06-06 LAB — PROLACTIN: Prolactin: 11.6 ng/mL (ref 4.8–23.3)

## 2019-06-06 LAB — FSH/LH
FSH: 2.8 m[IU]/mL
LH: 24 m[IU]/mL

## 2019-06-06 LAB — TSH: TSH: 0.95 u[IU]/mL (ref 0.450–4.500)

## 2019-06-06 LAB — INSULIN, RANDOM: INSULIN: 24 u[IU]/mL (ref 2.6–24.9)

## 2019-06-06 LAB — GLUCOSE, FASTING: Glucose, Plasma: 86 mg/dL (ref 65–99)

## 2019-06-19 ENCOUNTER — Telehealth: Payer: Self-pay | Admitting: Obstetrics and Gynecology

## 2019-06-19 NOTE — Telephone Encounter (Signed)
The patient called and stated that she is concerned bc she has not heard anything back yet from her provider or nurse in regards to her results. The patient is requesting a call back today if possible. Please advise.

## 2019-06-19 NOTE — Telephone Encounter (Signed)
Please advise on results

## 2019-06-20 NOTE — Telephone Encounter (Signed)
Scheduled patient to come in.

## 2019-06-24 ENCOUNTER — Encounter: Payer: Self-pay | Admitting: Obstetrics and Gynecology

## 2019-06-24 ENCOUNTER — Ambulatory Visit (INDEPENDENT_AMBULATORY_CARE_PROVIDER_SITE_OTHER): Payer: BC Managed Care – PPO | Admitting: Obstetrics and Gynecology

## 2019-06-24 ENCOUNTER — Other Ambulatory Visit: Payer: Self-pay

## 2019-06-24 VITALS — BP 120/82 | HR 85 | Wt 184.3 lb

## 2019-06-24 DIAGNOSIS — N914 Secondary oligomenorrhea: Secondary | ICD-10-CM | POA: Diagnosis not present

## 2019-06-24 DIAGNOSIS — E282 Polycystic ovarian syndrome: Secondary | ICD-10-CM

## 2019-06-24 DIAGNOSIS — E8881 Metabolic syndrome: Secondary | ICD-10-CM

## 2019-06-24 MED ORDER — METFORMIN HCL 500 MG PO TABS: ORAL_TABLET | ORAL | 5 refills | Status: DC

## 2019-06-24 MED ORDER — DESOGESTREL-ETHINYL ESTRADIOL 0.15-0.02/0.01 MG (21/5) PO TABS: 1.0000 | ORAL_TABLET | Freq: Every day | ORAL | 3 refills | Status: DC

## 2019-06-24 NOTE — Progress Notes (Signed)
HPI:      [name] is a 23 y.o. No obstetric history on file. who LMP was No LMP recorded.  Subjective:   Hx from last visit: G0.  She presents today with complaint of irregular menstrual cycles.  She states her cycles have been every 2 to 3 months often skipping cycles.  In addition she has been having unprotected intercourse for greater than 3 years without resultant pregnancy.  She has been actively trying to get pregnant over the last year. Patient had normal cycles starting at age 19 until age 54.  She began OCPs at that time over the next 2 to 3 years took OCPs without incident.  After discontinuation of OCPs her cycles became irregular as described above. Of significant note her partner has an 41-year-old child.  She presents today for follow-up of her PCO lab work.  She is currently on her menstrual period.    Hx: The following portions of the patient's history were reviewed and updated as appropriate:             She  has a past medical history of Crohn disease ([place]). She does not have a problem list on file. She  has a past surgical history that includes Colectomy; Appendectomy; Colostomy; and Colostomy reversal. Her family history includes Asthma in her father. She  reports that she has never smoked. She has never used smokeless tobacco. She reports that she does not drink alcohol or use drugs. She has a current medication list which includes the following prescription(s): infliximab. She has No Known Allergies.       Review of Systems:  Review of Systems  Constitutional: Denied constitutional symptoms, night sweats, recent illness, fatigue, fever, insomnia and weight loss.  Eyes: Denied eye symptoms, eye pain, photophobia, vision change and visual disturbance.  Ears/Nose/Throat/Neck: Denied ear, nose, throat or neck symptoms, hearing loss, nasal discharge, sinus congestion and sore throat.  Cardiovascular: Denied cardiovascular symptoms, arrhythmia, chest  pain/pressure, edema, exercise intolerance, orthopnea and palpitations.  Respiratory: Denied pulmonary symptoms, asthma, pleuritic pain, productive sputum, cough, dyspnea and wheezing.  Gastrointestinal: Denied, gastro-esophageal reflux, melena, nausea and vomiting.  Genitourinary: Denied genitourinary symptoms including symptomatic vaginal discharge, pelvic relaxation issues, and urinary complaints.  Musculoskeletal: Denied musculoskeletal symptoms, stiffness, swelling, muscle weakness and myalgia.  Dermatologic: Denied dermatology symptoms, rash and scar.  Neurologic: Denied neurology symptoms, dizziness, headache, neck pain and syncope.  Psychiatric: Denied psychiatric symptoms, anxiety and depression.  Endocrine: Denied endocrine symptoms including hot flashes and night sweats.   Meds:   Current Outpatient Medications on File Prior to Visit  Medication Sig Dispense Refill  . inFLIXimab (REMICADE) 100 MG injection Inject into the vein.     No current facility-administered medications on file prior to visit.     Objective:     Vitals:   06/24/19 1107  BP: 120/82  Pulse: 85              PCO labs reviewed individually with the patient.  Assessment:    No obstetric history on file. There are no active problems to display for this patient.    1. Secondary oligomenorrhea   2. PCO (polycystic ovaries)   3. Insulin resistance     PCO based on FSH to LH ratio and insulin resistance as well as clinical findings of oligomenorrhea and 1 year of infertility.   Plan:            1.  We have discussed PCO in detail.  The multi-system/organ nature of PCO discussed and reviewed.  2.  We will begin metformin daily to correct insulin resistance issue  3.  Begin OCPs and will continue for 3 months.  Expect ovulation after 3 months use.  Ovulation predictor kit use discussed prenatal vitamins discussed.  Timing of intercourse.  All questions answered Orders No orders of the defined  types were placed in this encounter.   No orders of the defined types were placed in this encounter.     F/U  Return in about 6 months (around 12/22/2019). I spent 18 minutes involved in the care of this patient of which greater than 50% was spent discussing PCO, pregnancy strategies with PCO, use of OCPs, insulin resistance, use of insulin sensitizer.  All questions answered.  Finis Bud, M.D. 06/24/2019 11:31 AM

## 2019-07-01 ENCOUNTER — Other Ambulatory Visit: Payer: Self-pay

## 2019-07-01 ENCOUNTER — Other Ambulatory Visit: Payer: Self-pay | Admitting: *Deleted

## 2019-07-01 DIAGNOSIS — Z20822 Contact with and (suspected) exposure to covid-19: Secondary | ICD-10-CM

## 2019-07-02 LAB — NOVEL CORONAVIRUS, NAA: SARS-CoV-2, NAA: NOT DETECTED

## 2019-08-14 ENCOUNTER — Other Ambulatory Visit: Payer: Self-pay | Admitting: *Deleted

## 2019-08-14 DIAGNOSIS — Z20822 Contact with and (suspected) exposure to covid-19: Secondary | ICD-10-CM

## 2019-08-16 LAB — NOVEL CORONAVIRUS, NAA: SARS-CoV-2, NAA: NOT DETECTED

## 2019-11-10 DIAGNOSIS — J309 Allergic rhinitis, unspecified: Secondary | ICD-10-CM | POA: Diagnosis not present

## 2019-11-10 DIAGNOSIS — K50919 Crohn's disease, unspecified, with unspecified complications: Secondary | ICD-10-CM | POA: Diagnosis not present

## 2019-11-10 DIAGNOSIS — U071 COVID-19: Secondary | ICD-10-CM | POA: Diagnosis not present

## 2019-11-10 DIAGNOSIS — J45998 Other asthma: Secondary | ICD-10-CM | POA: Diagnosis not present

## 2019-11-17 DIAGNOSIS — J309 Allergic rhinitis, unspecified: Secondary | ICD-10-CM | POA: Diagnosis not present

## 2019-11-17 DIAGNOSIS — U071 COVID-19: Secondary | ICD-10-CM | POA: Diagnosis not present

## 2019-11-17 DIAGNOSIS — K50919 Crohn's disease, unspecified, with unspecified complications: Secondary | ICD-10-CM | POA: Diagnosis not present

## 2019-11-17 DIAGNOSIS — J45998 Other asthma: Secondary | ICD-10-CM | POA: Diagnosis not present

## 2019-11-26 ENCOUNTER — Other Ambulatory Visit: Payer: Self-pay

## 2019-11-26 ENCOUNTER — Ambulatory Visit (INDEPENDENT_AMBULATORY_CARE_PROVIDER_SITE_OTHER): Payer: BC Managed Care – PPO | Admitting: Obstetrics and Gynecology

## 2019-11-26 ENCOUNTER — Encounter: Payer: Self-pay | Admitting: Obstetrics and Gynecology

## 2019-11-26 VITALS — BP 138/76 | HR 86 | Ht 59.0 in | Wt 187.5 lb

## 2019-11-26 DIAGNOSIS — K50918 Crohn's disease, unspecified, with other complication: Secondary | ICD-10-CM | POA: Diagnosis not present

## 2019-11-26 DIAGNOSIS — E282 Polycystic ovarian syndrome: Secondary | ICD-10-CM

## 2019-11-26 NOTE — Progress Notes (Signed)
HPI:      [name] is a 24 y.o. No obstetric history on file. who LMP was Patient's last menstrual period was 11/18/2019.  Subjective:   She presents today for follow-up of infertility because of her PCO.  She took 3 months of OCPs and had regular cycles.  In December she stopped taking OCPs.  She had a menstrual period in December January and February. (This is different for her because prior to this she was having menses every 2 to 3 months).  She has intermittently tried ovulation predictor kits but I am not sure that she has a good kit or is using it correctly.  She says that it may not be showing ovulation. Timing of intercourse reviewed and again it is difficult to tell if intercourse is happening appropriately during her fertility period. She continues to take Metformin.    Hx: The following portions of the patient's history were reviewed and updated as appropriate:             She  has a past medical history of Crohn disease ([place]). She does not have a problem list on file. She  has a past surgical history that includes Colectomy; Appendectomy; Colostomy; and Colostomy reversal. Her family history includes Asthma in her father. She  reports that she has never smoked. She has never used smokeless tobacco. She reports that she does not drink alcohol or use drugs. She has a current medication list which includes the following prescription(s): infliximab, desogestrel-ethinyl estradiol, and metformin. She has No Known Allergies.       Review of Systems:  Review of Systems  Constitutional: Denied constitutional symptoms, night sweats, recent illness, fatigue, fever, insomnia and weight loss.  Eyes: Denied eye symptoms, eye pain, photophobia, vision change and visual disturbance.  Ears/Nose/Throat/Neck: Denied ear, nose, throat or neck symptoms, hearing loss, nasal discharge, sinus congestion and sore throat.  Cardiovascular: Denied cardiovascular symptoms, arrhythmia, chest  pain/pressure, edema, exercise intolerance, orthopnea and palpitations.  Respiratory: Denied pulmonary symptoms, asthma, pleuritic pain, productive sputum, cough, dyspnea and wheezing.  Gastrointestinal: Denied, gastro-esophageal reflux, melena, nausea and vomiting.  Genitourinary: Denied genitourinary symptoms including symptomatic vaginal discharge, pelvic relaxation issues, and urinary complaints.  Musculoskeletal: Denied musculoskeletal symptoms, stiffness, swelling, muscle weakness and myalgia.  Dermatologic: Denied dermatology symptoms, rash and scar.  Neurologic: Denied neurology symptoms, dizziness, headache, neck pain and syncope.  Psychiatric: Denied psychiatric symptoms, anxiety and depression.  Endocrine: Denied endocrine symptoms including hot flashes and night sweats.   Meds:   Current Outpatient Medications on File Prior to Visit  Medication Sig Dispense Refill  . inFLIXimab (REMICADE) 100 MG injection Inject into the vein.    Marland Kitchen desogestrel-ethinyl estradiol (MIRCETTE) 0.15-0.02/0.01 MG (21/5) tablet Take 1 tablet by mouth at bedtime. (Patient not taking: Reported on 11/26/2019) 1 Package 3  . metFORMIN (GLUCOPHAGE) 500 MG tablet Take one tablet by mouth daily for one week. Then increase to one tablet twice a day. (Patient not taking: Reported on 11/26/2019) 60 tablet 5   No current facility-administered medications on file prior to visit.    Objective:     Vitals:   11/26/19 0956  BP: 138/76  Pulse: 86                Assessment:    No obstetric history on file. There are no problems to display for this patient.    1. PCO (polycystic ovaries)     Because of her irregular menses I believe she is  likely ovulatory despite questionable ovulation predictor kits.   Plan:            1.  As long as she has regular monthly cycles I recommended she continue to try to become pregnant.  Timing of intercourse discussed.  Use of different ovulation predictor kits  discussed.  Continue Metformin. If her periods become irregular and no ovulation is noted on ovulation predictor kits I would then consider another 56-monthcourse of OCPs, partner to have a semen analysis during this time, and then follow-up with Serophene use after discontinuing OCPs. I discussed all the above in detail with her all questions were answered.  She [place] become [place]. Orders No orders of the defined types were placed in this encounter.   No orders of the defined types were placed in this encounter.     F/U  Return in about 4 months (around 03/25/2020). I spent 23 minutes involved in the care of this patient preparing to see the patient by obtaining and reviewing her medical history (including labs, imaging tests and prior procedures), documenting clinical information in the electronic health record (EHR), counseling and coordinating care plans, writing and sending prescriptions, ordering tests or procedures and directly communicating with the patient by discussing pertinent items from her history and physical exam as well as detailing my assessment and plan as noted above so that she has an informed understanding.  All of her questions were answered. DFinis Bud M.D. 11/26/2019 10:39 AM

## 2020-01-06 DIAGNOSIS — K50918 Crohn's disease, unspecified, with other complication: Secondary | ICD-10-CM | POA: Diagnosis not present

## 2020-01-06 DIAGNOSIS — Z5112 Encounter for antineoplastic immunotherapy: Secondary | ICD-10-CM | POA: Diagnosis not present

## 2020-01-21 ENCOUNTER — Other Ambulatory Visit: Payer: Self-pay | Admitting: Obstetrics and Gynecology

## 2020-01-21 DIAGNOSIS — E282 Polycystic ovarian syndrome: Secondary | ICD-10-CM

## 2020-01-21 DIAGNOSIS — E8881 Metabolic syndrome: Secondary | ICD-10-CM

## 2020-02-03 DIAGNOSIS — K50918 Crohn's disease, unspecified, with other complication: Secondary | ICD-10-CM | POA: Diagnosis not present

## 2020-02-04 ENCOUNTER — Other Ambulatory Visit: Payer: Self-pay | Admitting: Obstetrics and Gynecology

## 2020-02-04 DIAGNOSIS — E282 Polycystic ovarian syndrome: Secondary | ICD-10-CM

## 2020-02-04 DIAGNOSIS — E8881 Metabolic syndrome: Secondary | ICD-10-CM

## 2020-02-04 NOTE — Telephone Encounter (Signed)
Please advise on refill.

## 2020-03-09 ENCOUNTER — Encounter: Payer: BC Managed Care – PPO | Admitting: Obstetrics and Gynecology

## 2020-03-17 ENCOUNTER — Encounter: Payer: BC Managed Care – PPO | Admitting: Obstetrics and Gynecology

## 2020-03-30 ENCOUNTER — Ambulatory Visit (INDEPENDENT_AMBULATORY_CARE_PROVIDER_SITE_OTHER): Payer: BC Managed Care – PPO | Admitting: Obstetrics and Gynecology

## 2020-03-30 ENCOUNTER — Telehealth: Payer: Self-pay | Admitting: Obstetrics and Gynecology

## 2020-03-30 ENCOUNTER — Other Ambulatory Visit: Payer: Self-pay

## 2020-03-30 ENCOUNTER — Encounter: Payer: Self-pay | Admitting: Obstetrics and Gynecology

## 2020-03-30 VITALS — BP 117/78 | HR 93 | Ht 59.0 in | Wt 183.2 lb

## 2020-03-30 DIAGNOSIS — N914 Secondary oligomenorrhea: Secondary | ICD-10-CM

## 2020-03-30 DIAGNOSIS — K50918 Crohn's disease, unspecified, with other complication: Secondary | ICD-10-CM | POA: Diagnosis not present

## 2020-03-30 DIAGNOSIS — E282 Polycystic ovarian syndrome: Secondary | ICD-10-CM | POA: Diagnosis not present

## 2020-03-30 DIAGNOSIS — Z319 Encounter for procreative management, unspecified: Secondary | ICD-10-CM

## 2020-03-30 MED ORDER — METFORMIN HCL 500 MG PO TABS: 500.0000 mg | ORAL_TABLET | Freq: Every day | ORAL | 1 refills | Status: DC

## 2020-03-30 MED ORDER — MEDROXYPROGESTERONE ACETATE 10 MG PO TABS: 10.0000 mg | ORAL_TABLET | Freq: Every day | ORAL | 3 refills | Status: DC

## 2020-03-30 MED ORDER — CLOMIPHENE CITRATE 50 MG PO TABS: 50.0000 mg | ORAL_TABLET | Freq: Every day | ORAL | 3 refills | Status: DC

## 2020-03-30 NOTE — Patient Instructions (Addendum)
CLOMID PATIENT INSTRUCTIONS  WHY USE IT? Clomid helps your ovaries to release eggs (ovulate).  HOW TO USE IT? Clomid is taken as a pill usually on days 5,6,7,8, & 9 of your cycle.  Day 1 is the first day of your period. The dose or duration may be changed to achieve ovulation.  Provera (progesterone) may first be used to bring on a period for some patients.  If you do not get pregnant this cycle, for your next cycles, take on days 1, 2, 3, 4 and 5.  If you do not get a period, take Provera 10 mg daily for 10 days to bring on a period; the first day you get bleeding is Day 1 of your cycle. The day of ovulation on Clomid is usually between cycle day 14 and 17.  Having sexual intercourse at least every other day between cycle day 12 and 18 will improve your chances of becoming pregnant during the Clomid cycle.  You may monitor your ovulation using basal body temperature charts or with ovulation kits.  If using the ovulation predictor kits, having intercourse the day of the surge and the two days following is recommended. If you get your period, call when it starts for an appointment with your doctor, so that an exam may be done, and another Clomid cycle can be considered if appropriate. If you do not get a period by day 35 of the cycle, please get a blood pregnancy test.  If it is negative, speak to your doctor for instructions to bring on another period and to plan a follow-up appointment.  THINGS TO KNOW: If you get pregnant while using Clomid, your chance of twins is 7% and triplets is less than 1%. Some studies have suggested the use of "fertility drugs" may increase your risk of ovarian cancers in the future.  It is unclear if these drugs increase the risk, or people who have problems with fertility are prone for these cancers.  If there is an actual risk, it is very low.  If you have a history of liver problems or ovarian cancer, it may be wise to avoid this medication.  SIDE EFFECTS:  The most  common side effect is hot flashes (20%).  Breast tenderness, headaches, nausea, bloating may also occur at different times.  Less than 3/1,000 people have dryness or loss of hair.  Persistent ovarian cysts may form from the use of this medication.  Ovarian hyperstimulation syndrome is a rare side effect at low doses.  Visual changes like flashes of light or blurring.     Female Infertility  Female infertility refers to a woman's inability to get pregnant (conceive) after a year of having sex regularly (or after 6 months in women over age 59) without using birth control. Infertility can also mean that a woman is not able to carry a pregnancy to full term. Both women and men can have fertility problems. What are the causes? This condition may be caused by:  Problems with reproductive organs. Infertility can result if a woman: ? Has an abnormally short cervix or a cervix that does not remain closed during a pregnancy. ? Has a blockage or scarring in the fallopian tubes. ? Has an abnormally shaped uterus. ? Has uterine fibroids. This is a benign mass of tissue or muscle (tumor) that can develop in the uterus. ? Is not ovulating in a regular way.  Certain medical conditions. These may include: ? Polycystic ovary syndrome (PCOS). This is a hormonal disorder  that can cause small cysts to grow on the ovaries. This is the most common cause of infertility in women. ? Endometriosis. This is a condition in which the tissue that lines the uterus (endometrium) grows outside of its normal location. ? Cancer and cancer treatments, such as chemotherapy or radiation. ? Premature ovarian failure. This is when ovaries stop producing eggs and hormones before age 28. ? Sexually transmitted diseases, such as chlamydia or gonorrhea. ? Autoimmune disorders. These are disorders in which the body's defense system (immune system) attacks normal, healthy cells. Infertility can be linked to more than one  cause. For some women, the cause of infertility is not known (unexplained infertility). What increases the risk?  Age. A woman's fertility declines with age, especially after her mid-30s.  Being underweight or overweight.  Drinking too much alcohol.  Using drugs such as anabolic steroids, cocaine, and marijuana.  Exercising excessively.  Being exposed to environmental toxins, such as radiation, pesticides, and certain chemicals. What are the signs or symptoms? The main sign of infertility in women is the inability to get pregnant or carry a pregnancy to full term. How is this diagnosed? This condition may be diagnosed by:  Checking whether you are ovulating each month. The tests may include: ? Blood tests to check hormone levels. ? An ultrasound of the ovaries. ? Taking a small tissue that lines the uterus and checking it under a microscope (endometrial biopsy).  Doing additional tests. This is done if ovulation is normal. Tests may include: ? Hysterosalpingography. This X-ray test can show the shape of the uterus and whether the fallopian tubes are open. ? Laparoscopy. This test uses a lighted tube (laparoscope) to look for problems in the fallopian tubes and other organs. ? Transvaginal ultrasound. This imaging test is used to check for abnormalities in the uterus and ovaries. ? Hysteroscopy. This test uses a lighted tube to check for problems in the cervix and the uterus. To be diagnosed with infertility, both partners will have a physical exam. Both partners will also have an extensive medical and sexual history taken. Additional tests may be done. How is this treated? Treatment depends on the cause of infertility. Most cases of infertility in women are treated with medicine or surgery.  Women may take medicine to: ? Correct ovulation problems. ? Treat other health conditions.  Surgery may be done to: ? Repair damage to the ovaries, fallopian tubes, cervix, or  uterus. ? Remove growths from the uterus. ? Remove scar tissue from the uterus, pelvis, or other organs. Assisted reproductive technology (ART) Assisted reproductive technology (ART) refers to all treatments and procedures that combine eggs and sperm outside the body to try to help a couple conceive. ART is often combined with fertility drugs to stimulate ovulation. Sometimes ART is done using eggs retrieved from another woman's body (donor eggs) or from previously frozen fertilized eggs (embryos). There are different types of ART. These include:  Intrauterine insemination (IUI). A long, thin tube is used to place sperm directly into a woman's uterus. This procedure: ? Is effective for infertility caused by sperm problems, including low sperm count and low motility. ? Can be used in combination with fertility drugs.  In vitro fertilization (IVF). This is done when a woman's fallopian tubes are blocked or when a man has low sperm count. In this procedure: ? Fertility drugs are used to stimulate the ovaries to produce multiple eggs. ? Once mature, these eggs are removed from the body and combined with  the sperm to be fertilized. ? The fertilized eggs are then placed into the woman's uterus. Follow these instructions at home:  Take over-the-counter and prescription medicines only as told by your health care provider.  Do not use any products that contain nicotine or tobacco, such as cigarettes and e-cigarettes. If you need help quitting, ask your health care provider.  If you drink alcohol, limit how much you have to 1 drink a day.  Make dietary changes to lose weight or maintain a healthy weight. Work with your health care provider and a dietitian to set a weight-loss goal that is healthy and reasonable for you.  Seek support from a counselor or support group to talk about your concerns related to infertility. Couples counseling may be helpful for you and your partner.  Practice stress  reduction techniques that work well for you, such as regular physical activity, meditation, or deep breathing.  Keep all follow-up visits as told by your health care provider. This is important. Contact a health care provider if you:  Feel that stress is interfering with your life and relationships.  Have side effects from treatments for infertility. Summary  Female infertility refers to a woman's inability to get pregnant (conceive) after a year of having sex regularly (or after 6 months in women over age 62) without using birth control.  To be diagnosed with infertility, both partners will have a physical exam. Both partners will also have an extensive medical and sexual history taken.  Seek support from a counselor or support group to talk about your concerns related to infertility. Couples counseling may be helpful for you and your partner. This information is not intended to replace advice given to you by your health care provider. Make sure you discuss any questions you have with your health care provider. Document Revised: 01/16/2019 Document Reviewed: 08/27/2017 Elsevier Patient Education  2020 [employer].

## 2020-03-30 NOTE — Progress Notes (Signed)
GYNECOLOGY PROGRESS NOTE  Subjective:    Patient ID: Sandra Wilkerson, female    DOB: 24-Mar-1996, 24 y.o.   MRN: [id number]  HPI  Patient is a 24 y.o. G0P0000 female who presents for second opinion regarding infertility.  Previously seen by Dr. Brennan Bailey of [facility].  Patient notes that she has been trying to conceive for over a year without success.  She reports that she was diagnosed with PCOS last year during her workup.  Her cycles are slightly irregular, occurring anywhere from q 30-42 days. She was previously tried on a trial of OCPs x 3 months to regulate her menstrual cycles, however stopping the OCPs her cycles returned to the q 30-42 days. She was also placed on Metformin, but could not tolerate the higher doses so she discontinued use. Sandra Wilkerson is using ovulation kits, but notes that she never has a positive test noting ovulation.   States that she tries to have intercourse at least once or twice weekly.  Her partner is slightly older, age 68, has a 45 year old son.  She denies any history of pelvic infections, surgeries.  She is not a smoker. Her partner does smoke (marijuana, cigars). He also has 1 child from a previous relationship, age 49.    The following portions of the patient's history were reviewed and updated as appropriate: allergies, current medications, past family history, past medical history, past social history, past surgical history and problem list.  Review of Systems Pertinent items noted in HPI and remainder of comprehensive ROS otherwise negative.   Objective:   Blood pressure 117/78, pulse 93, height 4\' 11"  (1.499 m), weight 183 lb 3.2 oz (83.1 kg), last menstrual period 03/05/2020. General appearance: alert and no distress Exam deferred.   Labs:  Results for Sandra Wilkerson, Sandra Wilkerson (MRN Sandra Wilkerson) as of 03/31/2020 20:55  Ref. Range 06/04/2019 10:01  DHEA-SO4 Latest Ref Range: 84.8 - 378.0 ug/dL 06/06/2019  LH Latest Units: mIU/mL 24.0  FSH Latest Units: mIU/mL 2.8    Prolactin Latest Ref Range: 4.8 - 23.3 ng/mL 11.6  Glucose, Plasma Latest Ref Range: 65 - 99 mg/dL 86  Sex Horm Binding Glob, Serum Latest Ref Range: 24.6 - 122.0 nmol/L 27.8  Testosterone Latest Ref Range: 8 - 48 ng/dL 44  Testosterone Free Latest Ref Range: 0.0 - 4.2 pg/mL 2.6  INSULIN Latest Ref Range: 2.6 - 24.9 uIU/mL 24.0  TSH Latest Ref Range: 0.450 - 4.500 uIU/mL 0.950     Assessment:   PCOS Irregular menses Primary infertility  Plan:   - Given that patient has anovulatory cycles, discussed Clomid challenge/therapy.  She was given Rx for Provera 10mg  po qd x 10 days.   She was told to watch for withdrawal bleeding after the course, the first day of bleeding will be Day 1 for her next cycle.  She was also given a Rx for Clomid 50mg  po qd to take on days 5-9 of her cycle.  The risks of Clomid including ovarian hyperstimulation with possible risk of ovarian cancer as well as multiple gestation were discussed with patient.  Patient also advised to continue frequent intercourse especially around Day 14 of her upcoming cycle (qod intercourse around days 9 - 18).  By Day 35, patient was told to call if she had her period.  If patient has bleeding at end of cycle, will increase Clomid by 50mg  for the following cycle; she can have a total of 6 cycles.  However if patient does not have  bleeding, she was told to do a pregnancy test/come in for evaluation. - If no pregnancy after first 3 rounds of COVID, may need to proceed with workup including ultrasound.  - Advised to encourage partner on smoking cessation as substances can affect sperm motility.  - Will have patient resume Metformin to enhance effects of Clomid in light of PCOS diagnosis.  Will remain at one tab daily as increased dosing was intolerableFolow up in 3 months.    A total of20 minutes were spent face-to-face with the patient during this encounter and over half of that time dealt with counseling, review of chart, and  coordination of care.   Rubie Maid, MD [facility] 03/31/2020 9:05 PM

## 2020-03-30 NOTE — Telephone Encounter (Signed)
Pt was seen today, didn't check out. I called the pt lmtrc if pt would like to schedule a f/u.

## 2020-03-30 NOTE — Progress Notes (Signed)
Pt present for fertility issues. Pt stated that her cycles are abnormal and is unable to keep track for pregnancy.

## 2020-03-31 ENCOUNTER — Encounter: Payer: Self-pay | Admitting: Obstetrics and Gynecology

## 2020-04-27 DIAGNOSIS — K50918 Crohn's disease, unspecified, with other complication: Secondary | ICD-10-CM | POA: Diagnosis not present

## 2020-06-01 DIAGNOSIS — K50119 Crohn's disease of large intestine with unspecified complications: Secondary | ICD-10-CM | POA: Diagnosis not present

## 2020-06-01 DIAGNOSIS — K5090 Crohn's disease, unspecified, without complications: Secondary | ICD-10-CM | POA: Diagnosis not present

## 2020-06-01 DIAGNOSIS — K648 Other hemorrhoids: Secondary | ICD-10-CM | POA: Diagnosis not present

## 2020-06-01 DIAGNOSIS — J45909 Unspecified asthma, uncomplicated: Secondary | ICD-10-CM | POA: Diagnosis not present

## 2020-06-01 DIAGNOSIS — Z7982 Long term (current) use of aspirin: Secondary | ICD-10-CM | POA: Diagnosis not present

## 2020-06-01 DIAGNOSIS — R21 Rash and other nonspecific skin eruption: Secondary | ICD-10-CM | POA: Diagnosis not present

## 2020-06-01 DIAGNOSIS — Z8616 Personal history of COVID-19: Secondary | ICD-10-CM | POA: Diagnosis not present

## 2020-06-01 DIAGNOSIS — Z6837 Body mass index (BMI) 37.0-37.9, adult: Secondary | ICD-10-CM | POA: Diagnosis not present

## 2020-06-01 DIAGNOSIS — Z79899 Other long term (current) drug therapy: Secondary | ICD-10-CM | POA: Diagnosis not present

## 2020-06-21 DIAGNOSIS — R1013 Epigastric pain: Secondary | ICD-10-CM | POA: Diagnosis not present

## 2020-06-21 DIAGNOSIS — Z79899 Other long term (current) drug therapy: Secondary | ICD-10-CM | POA: Diagnosis not present

## 2020-06-21 DIAGNOSIS — D649 Anemia, unspecified: Secondary | ICD-10-CM | POA: Diagnosis not present

## 2020-06-21 DIAGNOSIS — R109 Unspecified abdominal pain: Secondary | ICD-10-CM | POA: Diagnosis not present

## 2020-06-21 DIAGNOSIS — J45909 Unspecified asthma, uncomplicated: Secondary | ICD-10-CM | POA: Diagnosis not present

## 2020-06-21 DIAGNOSIS — K5090 Crohn's disease, unspecified, without complications: Secondary | ICD-10-CM | POA: Diagnosis not present

## 2020-06-21 DIAGNOSIS — Z825 Family history of asthma and other chronic lower respiratory diseases: Secondary | ICD-10-CM | POA: Diagnosis not present

## 2020-06-21 DIAGNOSIS — Z6837 Body mass index (BMI) 37.0-37.9, adult: Secondary | ICD-10-CM | POA: Diagnosis not present

## 2020-06-21 DIAGNOSIS — E282 Polycystic ovarian syndrome: Secondary | ICD-10-CM | POA: Diagnosis not present

## 2020-06-29 DIAGNOSIS — K50918 Crohn's disease, unspecified, with other complication: Secondary | ICD-10-CM | POA: Diagnosis not present

## 2020-06-29 NOTE — Progress Notes (Deleted)
Pt present for follow up for secondary oligo, PCO and infertility management.

## 2020-06-30 ENCOUNTER — Encounter: Payer: BC Managed Care – PPO | Admitting: Obstetrics and Gynecology

## 2020-07-27 DIAGNOSIS — K50918 Crohn's disease, unspecified, with other complication: Secondary | ICD-10-CM | POA: Diagnosis not present

## 2020-07-28 DIAGNOSIS — K3189 Other diseases of stomach and duodenum: Secondary | ICD-10-CM | POA: Diagnosis not present

## 2020-07-28 DIAGNOSIS — K5090 Crohn's disease, unspecified, without complications: Secondary | ICD-10-CM | POA: Diagnosis not present

## 2020-07-28 DIAGNOSIS — K529 Noninfective gastroenteritis and colitis, unspecified: Secondary | ICD-10-CM | POA: Diagnosis not present

## 2020-07-28 DIAGNOSIS — J45909 Unspecified asthma, uncomplicated: Secondary | ICD-10-CM | POA: Diagnosis not present

## 2020-07-28 DIAGNOSIS — Z980 Intestinal bypass and anastomosis status: Secondary | ICD-10-CM | POA: Diagnosis not present

## 2020-07-28 DIAGNOSIS — K5080 Crohn's disease of both small and large intestine without complications: Secondary | ICD-10-CM | POA: Diagnosis not present

## 2020-07-28 DIAGNOSIS — K5140 Inflammatory polyps of colon without complications: Secondary | ICD-10-CM | POA: Diagnosis not present

## 2020-08-10 ENCOUNTER — Other Ambulatory Visit: Payer: Self-pay

## 2020-08-10 ENCOUNTER — Ambulatory Visit (INDEPENDENT_AMBULATORY_CARE_PROVIDER_SITE_OTHER): Payer: BC Managed Care – PPO | Admitting: Obstetrics and Gynecology

## 2020-08-10 ENCOUNTER — Encounter: Payer: Self-pay | Admitting: Obstetrics and Gynecology

## 2020-08-10 VITALS — BP 123/82 | HR 86 | Ht 59.0 in | Wt 189.3 lb

## 2020-08-10 DIAGNOSIS — Z319 Encounter for procreative management, unspecified: Secondary | ICD-10-CM

## 2020-08-10 DIAGNOSIS — Z8742 Personal history of other diseases of the female genital tract: Secondary | ICD-10-CM | POA: Diagnosis not present

## 2020-08-10 MED ORDER — CLOMIPHENE CITRATE 50 MG PO TABS: 100.0000 mg | ORAL_TABLET | Freq: Every day | ORAL | 2 refills | Status: DC

## 2020-08-10 NOTE — Progress Notes (Signed)
Pt present for follow up for infertility issues. Pt stated that her cycles are starting to be regular since starting clomid.

## 2020-08-10 NOTE — Progress Notes (Signed)
    GYNECOLOGY PROGRESS NOTE  Subjective:    Patient ID: Sandra Wilkerson, female    DOB: Jun 30, 1996, 24 y.o.   MRN: [id number]  HPI  Patient is a 24 y.o. G0P0000 female who presents for 4 month f/u of fertility.  Reports taking the Clomid as directed for 3 months. Did not have any side effects. Did not take Clomid last month as she had run out and could not get an appointment until this month. Is tracking her cycles with a phone app, and using ovulation kits. Reports that her cycles have become regularly occurring with use of the Clomid.  Has not had to use the Provera.    The following portions of the patient's history were reviewed and updated as appropriate: allergies, current medications, past family history, past medical history, past social history, past surgical history and problem list.  Review of Systems Pertinent items noted in HPI and remainder of comprehensive ROS otherwise negative.   Objective:   Blood pressure 123/82, pulse 86, height 4\' 11"  (1.499 m), weight 189 lb 4.8 oz (85.9 kg), last menstrual period 07/20/2020. General appearance: alert and no distress Abdomen: soft, non-tender; bowel sounds normal; no masses,  no organomegaly Remainder of the exam deferred.    Assessment:   Infertility management  Plan:   - Advised to continue Clomid for ovulation induction.  Will increase dose to 100 mg x 3 months.  Discussed warning sides of ovarian hyperstimulation, side effects of higher dosing of medication. Continue intercourse during fertile period.  - Will also order pelvic ultrasound to rule out other causes of infertility, f/u on h/o possible PCOS.  - Follow up in 3 months for reassessment.    09/19/2020, MD [facility]

## 2020-08-10 NOTE — Patient Instructions (Signed)
CLOMID CHALLENGE  THINGS TO KNOW: If you get pregnant while using Clomid, your chance of twins is 7% and triplets is less than 1%. Some studies have suggested the use of "fertility drugs" may increase your risk of ovarian cancers in the future.  It is unclear if these drugs increase the risk, or people who have problems with fertility are prone for these cancers.  If there is an actual risk, it is very low.  If you have a history of liver problems or ovarian cancer, it may be wise to avoid this medication.  SIDE EFFECTS:  The most common side effect is hot flashes (20%).  Breast tenderness, headaches, nausea, bloating may also occur at different times.  Less than 3/1,000 people have dryness or loss of hair.  Persistent ovarian cysts may form from the use of this medication.  Ovarian hyperstimulation syndrome is a rare side effect at low doses.  Visual changes like flashes of light or blurring.

## 2020-08-11 ENCOUNTER — Telehealth: Payer: Self-pay

## 2020-08-11 NOTE — Telephone Encounter (Signed)
Called patient to get her scheduled for her visit's, was unable to reach patient, LVM informing patient of these appointments that were made for her, and to call the office if she had any questions or concerns. Phone number was provided.

## 2020-08-18 NOTE — Addendum Note (Signed)
Addended by: Fabian November on: 08/18/2020 05:27 PM   Modules accepted: Level of Service

## 2020-08-24 DIAGNOSIS — Z5181 Encounter for therapeutic drug level monitoring: Secondary | ICD-10-CM | POA: Diagnosis not present

## 2020-08-24 DIAGNOSIS — K50918 Crohn's disease, unspecified, with other complication: Secondary | ICD-10-CM | POA: Diagnosis not present

## 2020-09-14 ENCOUNTER — Ambulatory Visit (INDEPENDENT_AMBULATORY_CARE_PROVIDER_SITE_OTHER): Payer: BC Managed Care – PPO

## 2020-09-14 ENCOUNTER — Encounter: Payer: Self-pay | Admitting: Obstetrics and Gynecology

## 2020-09-14 ENCOUNTER — Other Ambulatory Visit: Payer: Self-pay

## 2020-09-14 ENCOUNTER — Ambulatory Visit (INDEPENDENT_AMBULATORY_CARE_PROVIDER_SITE_OTHER): Payer: BC Managed Care – PPO | Admitting: Obstetrics and Gynecology

## 2020-09-14 VITALS — BP 108/73 | HR 77 | Ht 59.0 in | Wt 189.5 lb

## 2020-09-14 DIAGNOSIS — E282 Polycystic ovarian syndrome: Secondary | ICD-10-CM | POA: Diagnosis not present

## 2020-09-14 DIAGNOSIS — Z319 Encounter for procreative management, unspecified: Secondary | ICD-10-CM | POA: Diagnosis not present

## 2020-09-14 DIAGNOSIS — N970 Female infertility associated with anovulation: Secondary | ICD-10-CM

## 2020-09-14 NOTE — Progress Notes (Signed)
Pt present for follow up and ultrasound for infertility issues. Pt stated that she was doing well no problems.

## 2020-09-14 NOTE — Progress Notes (Signed)
    GYNECOLOGY PROGRESS NOTE  Subjective:    Patient ID: Sandra Wilkerson, female    DOB: January 08, 1996, 24 y.o.   MRN: [id number]  HPI  Patient is a 24 y.o. G0P0000 female who presents for follow up of ultrasound results. Has been on Clomid for infertility for 3 months.  Was instructed to start on 100 mg dosing last month, but notes that she did not start the new dosing as she never had a cycle in November. Took a pregnancy test which was negative.  The following portions of the patient's history were reviewed and updated as appropriate: allergies, current medications, past family history, past medical history, past social history, past surgical history and problem list.  Review of Systems Pertinent items noted in HPI and remainder of comprehensive ROS otherwise negative.   Objective:   Blood pressure 108/73, pulse 77, height 4\' 11"  (1.499 m), weight 189 lb 8 oz (86 kg), last menstrual period 07/19/2020. General appearance: alert and no distress Remainder of exam deferred.   Imaging:  Patient Name: Sandra Wilkerson DOB: [birth year] MRN: 1995 ULTRASOUND REPORT  Location: [facility]  Date of Service: 09/14/2020     Indications:PCOS  Findings:  The uterus is anteverted and measures 6.6 x 4.4 x 4.3 cm.. Echo texture is homogenous without evidence of focal masses.  The Endometrium measures 5 mm.  Right Ovary measures 3.3 x 2.4 x 3.1 cm. It is normal in appearance. Multiple small cystic structures throughout ovary measuring 2-3 mm  Left Ovary measures 3.0 x 1.8 x 2.3 cm. It is normal in appearance.Multiple small cystic structures throughout ovary measuring 2-3 mm  Survey of the adnexa demonstrates no adnexal masses. There is no free fluid in the cul de sac.  Impression: 1. Small uniform follicles throughout both ovaries. Suggest PCOS.  Recommendations: 1.Clinical correlation with the patient's History and Physical Exam.   Jenine M. 14/04/2020     RDMS  Assessment:   1. Primary anovulatory infertility   2. PCOS (polycystic ovarian syndrome)     Plan:  1. Reviewed ultrasound results with patient.  Confirmed presence of PCOS.   2. Advised that patient needs to resume Provera as she still has not had a cycle, then start Clomid at higher dosing as prescribed. Continue Metformin.  3. To f/u in 3 months after use of high dose Clomid.      Marciano Sequin, MD [facility]

## 2020-09-14 NOTE — Patient Instructions (Signed)
Polycystic Ovarian Syndrome  Polycystic ovarian syndrome (PCOS) is a common hormonal disorder among women of reproductive age. In most women with PCOS, many small fluid-filled sacs (cysts) grow on the ovaries, and the cysts are not part of a normal menstrual cycle. PCOS can cause problems with your menstrual periods and make it difficult to get pregnant. It can also cause an increased risk of miscarriage with pregnancy. If it is not treated, PCOS can lead to serious health problems, such as diabetes and heart disease. What are the causes? The cause of PCOS is not known, but it may be the result of a combination of certain factors, such as:  Irregular menstrual cycle.  High levels of certain hormones (androgens).  Problems with the hormone that helps to control blood sugar (insulin resistance).  Certain genes. What increases the risk? This condition is more likely to develop in women who have a family history of PCOS. What are the signs or symptoms? Symptoms of PCOS may include:  Multiple ovarian cysts.  Infrequent periods or no periods.  Periods that are too frequent or too heavy.  Unpredictable periods.  Inability to get pregnant (infertility) because of not ovulating.  Increased growth of hair on the face, chest, stomach, back, thumbs, thighs, or toes.  Acne or oily skin. Acne may develop during adulthood, and it may not respond to treatment.  Pelvic pain.  Weight gain or obesity.  Patches of thickened and dark brown or black skin on the neck, arms, breasts, or thighs (acanthosis nigricans).  Excess hair growth on the face, chest, abdomen, or upper thighs (hirsutism). How is this diagnosed? This condition is diagnosed based on:  Your medical history.  A physical exam, including a pelvic exam. Your health care provider may look for areas of increased hair growth on your skin.  Tests, such as: ? Ultrasound. This may be used to examine the ovaries and the lining of the  uterus (endometrium) for cysts. ? Blood tests. These may be used to check levels of sugar (glucose), female hormone (testosterone), and female hormones (estrogen and progesterone) in your blood. How is this treated? There is no cure for PCOS, but treatment can help to manage symptoms and prevent more health problems from developing. Treatment varies depending on:  Your symptoms.  Whether you want to have a baby or whether you need birth control (contraception). Treatment may include nutrition and lifestyle changes along with:  Progesterone hormone to start a menstrual period.  Birth control pills to help you have regular menstrual periods.  Medicines to make you ovulate, if you want to get pregnant.  Medicine to reduce excessive hair growth.  Surgery, in severe cases. This may involve making small holes in one or both of your ovaries. This decreases the amount of testosterone that your body produces. Follow these instructions at home:  Take over-the-counter and prescription medicines only as told by your health care provider.  Follow a healthy meal plan. This can help you reduce the effects of PCOS. ? Eat a healthy diet that includes lean proteins, complex carbohydrates, fresh fruits and vegetables, low-fat dairy products, and healthy fats. Make sure to eat enough fiber.  If you are overweight, lose weight as told by your health care provider. ? Losing 10% of your body weight may improve symptoms. ? Your health care provider can determine how much weight loss is best for you and can help you lose weight safely.  Keep all follow-up visits as told by your health care provider.   This is important. Contact a health care provider if:  Your symptoms do not get better with medicine.  You develop new symptoms. This information is not intended to replace advice given to you by your health care provider. Make sure you discuss any questions you have with your health care provider. Document  Revised: 09/07/2017 Document Reviewed: 03/12/2016 Elsevier Patient Education  2020 [employer]  

## 2020-09-21 DIAGNOSIS — K50918 Crohn's disease, unspecified, with other complication: Secondary | ICD-10-CM | POA: Diagnosis not present

## 2020-11-10 DIAGNOSIS — K50919 Crohn's disease, unspecified, with unspecified complications: Secondary | ICD-10-CM | POA: Diagnosis not present

## 2020-11-11 ENCOUNTER — Encounter: Payer: BC Managed Care – PPO | Admitting: Obstetrics and Gynecology

## 2020-11-11 ENCOUNTER — Other Ambulatory Visit: Payer: BC Managed Care – PPO

## 2020-11-23 ENCOUNTER — Other Ambulatory Visit: Payer: Self-pay

## 2020-11-23 ENCOUNTER — Encounter: Payer: Self-pay | Admitting: Obstetrics and Gynecology

## 2020-11-23 ENCOUNTER — Ambulatory Visit (INDEPENDENT_AMBULATORY_CARE_PROVIDER_SITE_OTHER): Payer: BC Managed Care – PPO | Admitting: Obstetrics and Gynecology

## 2020-11-23 VITALS — BP 105/72 | HR 105 | Ht 59.0 in | Wt 187.2 lb

## 2020-11-23 DIAGNOSIS — K50119 Crohn's disease of large intestine with unspecified complications: Secondary | ICD-10-CM

## 2020-11-23 DIAGNOSIS — N970 Female infertility associated with anovulation: Secondary | ICD-10-CM

## 2020-11-23 DIAGNOSIS — N926 Irregular menstruation, unspecified: Secondary | ICD-10-CM | POA: Diagnosis not present

## 2020-11-23 DIAGNOSIS — E282 Polycystic ovarian syndrome: Secondary | ICD-10-CM | POA: Diagnosis not present

## 2020-11-23 LAB — POCT URINE PREGNANCY: Preg Test, Ur: POSITIVE — AB

## 2020-11-23 NOTE — Patient Instructions (Addendum)
Morning Sickness  Morning sickness is when you feel like you may vomit (feel nauseous) during pregnancy. Sometimes, you may vomit. Morning sickness most often happens in the morning, but it can also happen at any time of the day. Some women may have morning sickness that makes them vomit all the time. This is a more serious problem that needs treatment. What are the causes? The cause of this condition is not known. What increases the risk?  You had vomiting or a feeling like you may vomit before your pregnancy.  You had morning sickness in another pregnancy.  You are pregnant with more than one baby, such as twins. What are the signs or symptoms?  Feeling like you may vomit.  Vomiting. How is this treated? Treatment is usually not needed for this condition. You may only need to change what you eat. In some cases, your doctor may give you some things to take for your condition. These include:  Vitamin B6 supplements.  Medicines to treat the feeling that you may vomit.  Ginger. Follow these instructions at home: Medicines  Take over-the-counter and prescription medicines only as told by your doctor. Do not take any medicines until you talk with your doctor about them first.  Take multivitamins before you get pregnant. These can stop or lessen the symptoms of morning sickness. Eating and drinking  Eat dry toast or crackers before getting out of bed.  Eat 5 or 6 small meals a day.  Eat dry and bland foods like rice and baked potatoes.  Do not eat greasy, fatty, or spicy foods.  Have someone cook for you if the smell of food causes you to vomit or to feel like you may vomit.  If you feel like you may vomit after taking prenatal vitamins, take them at night or with a snack.  Eat protein foods when you need a snack. Nuts, yogurt, and cheese are good choices.  Drink fluids throughout the day.  Try ginger ale made with real ginger, ginger tea made from fresh grated ginger, or  ginger candies. General instructions  Do not smoke or use any products that contain nicotine or tobacco. If you need help quitting, ask your doctor.  Use an air purifier to keep the air in your house free of smells.  Get lots of fresh air.  Try to avoid smells that make you feel sick.  Try wearing an acupressure wristband. This is a wristband that is used to treat seasickness.  Try a treatment called acupuncture. In this treatment, a doctor puts needles into certain areas of your body to make you feel better. Contact a doctor if:  You need medicine to feel better.  You feel dizzy or light-headed.  You are losing weight. Get help right away if:  The feeling that you may vomit will not go away, or you cannot stop vomiting.  You faint.  You have very bad pain in your belly. Summary  Morning sickness is when you feel like you may vomit (feel nauseous) during pregnancy.  You may feel sick in the morning, but you can feel this way at any time of the day.  Making some changes to what you eat may help your symptoms go away. This information is not intended to replace advice given to you by your health care provider. Make sure you discuss any questions you have with your health care provider. Document Revised: 05/10/2020 Document Reviewed: 04/19/2020 Elsevier Patient Education  2021 [employer]. [email or web address].[email or web address]">  First Trimester  of Pregnancy  The first trimester of pregnancy starts on the first day of your last menstrual period until the end of week 12. This is also called months 1 through 3 of pregnancy. Body changes during your first trimester Your body goes through many changes during pregnancy. The changes usually return to normal after your baby is born. Physical changes  You may gain or lose weight.  Your breasts may grow larger and hurt. The area around your nipples may get darker.  Dark spots or blotches may develop on your  face.  You may have changes in your hair. Health changes  You may feel like you might vomit (nauseous), and you may vomit.  You may have heartburn.  You may have headaches.  You may have trouble pooping (constipation).  Your gums may bleed. Other changes  You may get tired easily.  You may pee (urinate) more often.  Your menstrual periods will stop.  You may not feel hungry.  You may want to eat certain kinds of food.  You may have changes in your emotions from day to day.  You may have more dreams. Follow these instructions at home: Medicines  Take over-the-counter and prescription medicines only as told by your doctor. Some medicines are not safe during pregnancy.  Take a prenatal vitamin that contains at least 600 micrograms (mcg) of folic acid. Eating and drinking  Eat healthy meals that include: ? Fresh fruits and vegetables. ? Whole grains. ? Good sources of protein, such as meat, eggs, or tofu. ? Low-fat dairy products.  Avoid raw meat and unpasteurized juice, milk, and cheese.  If you feel like you may vomit, or you vomit: ? Eat 4 or 5 small meals a day instead of 3 large meals. ? Try eating a few soda crackers. ? Drink liquids between meals instead of during meals.  You may need to take these actions to prevent or treat trouble pooping: ? Drink enough fluids to keep your pee (urine) pale yellow. ? Eat foods that are high in fiber. These include beans, whole grains, and fresh fruits and vegetables. ? Limit foods that are high in fat and sugar. These include fried or sweet foods. Activity  Exercise only as told by your doctor. Most people can do their usual exercise routine during pregnancy.  Stop exercising if you have cramps or pain in your lower belly (abdomen) or low back.  Do not exercise if it is too hot or too humid, or if you are in a place of great height (high altitude).  Avoid heavy lifting.  If you choose to, you may have sex unless  your doctor tells you not to. Relieving pain and discomfort  Wear a good support bra if your breasts are sore.  Rest with your legs raised (elevated) if you have leg cramps or low back pain.  If you have bulging veins (varicose veins) in your legs: ? Wear support hose as told by your doctor. ? Raise your feet for 15 minutes, 3-4 times a day. ? Limit salt in your food. Safety  Wear your seat belt at all times when you are in a car.  Talk with your doctor if someone is hurting you or yelling at you.  Talk with your doctor if you are feeling sad or have thoughts of hurting yourself. Lifestyle  Do not use hot tubs, steam rooms, or saunas.  Do not douche. Do not use tampons or scented sanitary pads.  Do not use herbal medicines,  illegal drugs, or medicines that are not approved by your doctor. Do not drink alcohol.  Do not smoke or use any products that contain nicotine or tobacco. If you need help quitting, ask your doctor.  Avoid cat litter boxes and soil that is used by cats. These carry germs that can cause harm to the baby and can cause a loss of your baby by miscarriage or stillbirth. General instructions  Keep all follow-up visits. This is important.  Ask for help if you need counseling or if you need help with nutrition. Your doctor can give you advice or tell you where to go for help.  Visit your dentist. At home, brush your teeth with a soft toothbrush. Floss gently.  Write down your questions. Take them to your prenatal visits. Where to find more information  American Pregnancy Association: americanpregnancy.org  [employer]: www.acog.org  Office on Women's Health: KeywordPortfolios.com.br Contact a doctor if:  You are dizzy.  You have a fever.  You have mild cramps or pressure in your lower belly.  You have a nagging pain in your belly area.  You continue to feel like you may vomit, you vomit, or you have watery  poop (diarrhea) for 24 hours or longer.  You have a bad-smelling fluid coming from your vagina.  You have pain when you pee.  You are exposed to a disease that spreads from person to person, such as chickenpox, measles, Zika virus, HIV, or hepatitis. Get help right away if:  You have spotting or bleeding from your vagina.  You have very bad belly cramping or pain.  You have shortness of breath or chest pain.  You have any kind of injury, such as from a fall or a car crash.  You have new or increased pain, swelling, or redness in an arm or leg. Summary  The first trimester of pregnancy starts on the first day of your last menstrual period until the end of week 12 (months 1 through 3).  Eat 4 or 5 small meals a day instead of 3 large meals.  Do not smoke or use any products that contain nicotine or tobacco. If you need help quitting, ask your doctor.  Keep all follow-up visits. This information is not intended to replace advice given to you by your health care provider. Make sure you discuss any questions you have with your health care provider. Document Revised: 03/03/2020 Document Reviewed: 01/08/2020 Elsevier Patient Education  2021 [employer]. Commonly Asked Questions During Pregnancy  Cats: A parasite can be excreted in cat feces.  To avoid exposure you need to have another person empty the little box.  If you must empty the litter box you will need to wear gloves.  Wash your hands after handling your cat.  This parasite can also be found in raw or undercooked meat so this should also be avoided.  Colds, Sore Throats, Flu: Please check your medication sheet to see what you can take for symptoms.  If your symptoms are unrelieved by these medications please call the office.  Dental Work: Most any dental work Investment banker, corporate recommends is permitted.  X-rays should only be taken during the first trimester if absolutely necessary.  Your abdomen should be shielded with a lead apron  during all x-rays.  Please notify your provider prior to receiving any x-rays.  Novocaine is fine; gas is not recommended.  If your dentist requires a note from [place] prior to dental work please call the  office and we will provide one for you.  Exercise: Exercise is an important part of staying healthy during your pregnancy.  You may continue most exercises you were accustomed to prior to pregnancy.  Later in your pregnancy you will most likely notice you have difficulty with activities requiring balance like riding a bicycle.  It is important that you listen to your body and avoid activities that put you at a higher risk of falling.  Adequate rest and staying well hydrated are a must!  If you have questions about the safety of specific activities ask your provider.    Exposure to Children with illness: Try to avoid obvious exposure; report any symptoms to [place] when noted,  If you have chicken pos, red measles or mumps, you should be immune to these diseases.   Please do not take any vaccines while pregnant unless you have checked with your OB provider.  Fetal Movement: After 28 weeks we recommend you do "kick counts" twice daily.  Lie or sit down in a calm quiet environment and count your baby movements "kicks".  You should feel your baby at least 10 times per hour.  If you have not felt 10 kicks within the first hour get up, walk around and have something sweet to eat or drink then repeat for an additional hour.  If count remains less than 10 per hour notify your provider.  Fumigating: Follow your pest control agent's advice as to how long to stay out of your home.  Ventilate the area well before re-entering.  Hemorrhoids:   Most over-the-counter preparations can be used during pregnancy.  Check your medication to see what is safe to use.  It is important to use a stool softener or fiber in your diet and to drink lots of liquids.  If hemorrhoids seem to be getting worse please call the office.   Hot Tubs:   Hot tubs Jacuzzis and saunas are not recommended while pregnant.  These increase your internal body temperature and should be avoided.  Intercourse:  Sexual intercourse is safe during pregnancy as long as you are comfortable, unless otherwise advised by your provider.  Spotting may occur after intercourse; report any bright red bleeding that is heavier than spotting.  Labor:  If you know that you are in labor, please go to the hospital.  If you are unsure, please call the office and let us help you decide what to do.  Lifting, straining, etc:  If your job requires heavy lifting or straining please check with your provider for any limitations.  Generally, you should not lift items heavier than that you can lift simply with your hands and arms (no back muscles)  Painting:  Paint fumes do not harm your pregnancy, but may make you ill and should be avoided if possible.  Latex or water based paints have less odor than oils.  Use adequate ventilation while painting.  Permanents & Hair Color:  Chemicals in hair dyes are not recommended as they cause increase hair dryness which can increase hair loss during pregnancy.  " Highlighting" and permanents are allowed.  Dye may be absorbed differently and permanents may not hold as well during pregnancy.  Sunbathing:  Use a sunscreen, as skin burns easily during pregnancy.  Drink plenty of fluids; avoid over heating.  Tanning Beds:  Because their possible side effects are still unknown, tanning beds are not recommended.  Ultrasound Scans:  Routine ultrasounds are performed at approximately 20 weeks.  You will be  able to see your baby's general anatomy an if you would like to know the gender this can usually be determined as well.  If it is questionable when you conceived you may also receive an ultrasound early in your pregnancy for dating purposes.  Otherwise ultrasound exams are not routinely performed unless there is a medical necessity.  Although you can  request a scan we ask that you pay for it when conducted because insurance does not cover " patient request" scans.  Work: If your pregnancy proceeds without complications you may work until your due date, unless your physician or employer advises otherwise.  Round Ligament Pain/Pelvic Discomfort:  Sharp, shooting pains not associated with bleeding are fairly common, usually occurring in the second trimester of pregnancy.  They tend to be worse when standing up or when you remain standing for long periods of time.  These are the result of pressure of certain pelvic ligaments called "round ligaments".  Rest, Tylenol and heat seem to be the most effective relief.  As the womb and fetus grow, they rise out of the pelvis and the discomfort improves.  Please notify the office if your pain seems different than that described.  It may represent a more serious condition.  Common Medications Safe in Pregnancy  Acne:      Constipation:  Benzoyl Peroxide     Colace  Clindamycin      Dulcolax Suppository  Topica Erythromycin     Fibercon  Salicylic Acid      Metamucil         Miralax AVOID:        Senakot   Accutane    Cough:  Retin-A       Cough Drops  Tetracycline      Phenergan w/ Codeine if Rx  Minocycline      Robitussin (Plain & DM)  Antibiotics:     Crabs/Lice:  Ceclor       RID  Cephalosporins    AVOID:  E-Mycins      Kwell  Keflex  Macrobid/Macrodantin   Diarrhea:  Penicillin      Kao-Pectate  Zithromax      Imodium AD         PUSH FLUIDS AVOID:       Cipro     Fever:  Tetracycline      Tylenol (Regular or Extra  Minocycline       Strength)  Levaquin      Extra Strength-Do not          Exceed 8 tabs/24 hrs Caffeine:        <2107m/day (equiv. To 1 cup of coffee or  approx. 3 12 oz sodas)         Gas: Cold/Hayfever:       Gas-X  Benadryl      Mylicon  Claritin       Phazyme  **Claritin-D        Chlor-Trimeton    Headaches:  Dimetapp      ASA-Free  Excedrin  Drixoral-Non-Drowsy     Cold Compress  Mucinex (Guaifenasin)     Tylenol (Regular or Extra  Sudafed/Sudafed-12 Hour     Strength)  **Sudafed PE Pseudoephedrine   Tylenol Cold & Sinus     Vicks Vapor Rub  Zyrtec  **AVOID if Problems With Blood Pressure         Heartburn: Avoid lying down for at least 1 hour after meals  Aciphex      Maalox  Rash:  Milk of Magnesia     Benadryl    Mylanta       1% Hydrocortisone Cream  Pepcid  Pepcid Complete   Sleep Aids:  Prevacid      Ambien   Prilosec       Benadryl  Rolaids       Chamomile Tea  Tums (Limit 4/day)     Unisom         Tylenol PM         Warm milk-add vanilla or  Hemorrhoids:       Sugar for taste  Anusol/Anusol H.C.  (RX: Analapram 2.5%)  Sugar Substitutes:  Hydrocortisone OTC     Ok in moderation  Preparation H      Tucks        Vaseline lotion applied to tissue with wiping    Herpes:     Throat:  Acyclovir      Oragel  Famvir  Valtrex     Vaccines:         Flu Shot Leg Cramps:       *Gardasil  Benadryl      Hepatitis A         Hepatitis B Nasal Spray:       Pneumovax  Saline Nasal Spray     Polio Booster         Tetanus Nausea:       Tuberculosis test or PPD  Vitamin B6 25 mg TID   AVOID:    Dramamine      *Gardasil  Emetrol       Live Poliovirus  Ginger Root 250 mg QID    MMR (measles, mumps &  High Complex Carbs @ Bedtime    rebella)  Sea Bands-Accupressure    Varicella (Chickenpox)  Unisom 1/2 tab TID     *No known complications           If received before Pain:         Known pregnancy;   Darvocet       Resume series after  Lortab        Delivery  Percocet    Yeast:   Tramadol      Femstat  Tylenol 3      Gyne-lotrimin  Ultram       Monistat  Vicodin           MISC:         All Sunscreens           Hair Coloring/highlights          Insect Repellant's          (Including DEET)         Mystic Tans  Tests and Screening During Pregnancy Having certain tests and screenings  during pregnancy is an important part of your prenatal care. These tests help your health care provider find problems that might affect your pregnancy. Some tests must be done for all pregnant women, and some are optional. Most of the tests and screenings do not pose any risks for you or your baby. You may need additional testing if any routine tests indicate a problem. Tests and screenings done early in pregnancy Some tests and screenings you can expect to have in early pregnancy include:  Blood tests, such as: ? Complete blood count (CBC). This test is done to check your red and white blood cells. It can help identify a risk for anemia, infection, or bleeding. ? Blood typing. This  test shows your blood type. It also shows whether you have a certain protein in your red blood cells called the Rh factor. It can be dangerous for your baby if you do not have this protein (Rh negative) and your baby has it (Rh positive). ? Tests to check for diseases that can cause birth defects or can be passed to your baby, such as:  [place] measles (rubella) and chicken pox. The test indicates whether you are immune to these diseases.  Hepatitis B and C.  Human Immunodeficiency Virus (HIV).  Syphilis.  Zika virus, if you or your partner has traveled to an area where the virus occurs.  Urine testing. This checks for sugar in your urine and for signs of infection.  Blood pressure. This is to check for high blood pressure and preeclampsia.  Testing for sexually transmitted infections (STIs), such as chlamydia or gonorrhea.  Testing for tuberculosis. You may have this skin test if you are at risk for tuberculosis.  Fetal ultrasound. This is an imaging study of your growing baby. It uses sound waves to create pictures of your baby. This test may be done to help determine your due date and to ensure you do not have an ectopic pregnancy. An ectopic pregnancy is a pregnancy that grows outside of the uterus.   Tests  and screenings done later in pregnancy Certain tests are done for the first time later in the pregnancy. Some of the tests that were done in early pregnancy are repeated at this time. Some common tests you can expect to have later in pregnancy include:  Rh antibody testing. If you are Rh negative, you will have a blood test at about 28 weeks of pregnancy to see if you are producing Rh antibodies. If you have not started to make antibodies, you will be given an injection to prevent you from making antibodies for the rest of your pregnancy.  Glucose screening. This checks your blood sugar. It will show whether you are developing the type of diabetes that occurs during pregnancy (gestational diabetes). You may have this screening earlier if you have risk factors for diabetes.  Screening for group B streptococcus (GBS). GBS is a type of bacteria that may live in your rectum or vagina. GBS can spread to your baby during birth. This is done at 35-37 weeks of pregnancy. If testing is positive for GBS, you may be treated with antibiotic medicine.  Urine and blood tests to monitor for other pregnancy problems, such as preeclampsia or anemia.  Blood pressure to monitor for high blood pressure and preeclampsia.  Fetal ultrasound. This may be repeated at 16-20 weeks to check how your baby is growing and developing.  Non-stress test. This test is done later in pregnancy to check your baby's heart rate. This may be repeated weekly if your pregnancy is high risk.  Biophysical profile. This test includes ultrasound imaging and a non-stress test to ensure your baby is healthy. This test may help decide when your baby should be born.   Screening for birth defects Some birth defects are caused by abnormal genes passed down through families. Early in your pregnancy, tests can be done to find out if your baby is at risk for a genetic disorder. This testing is optional. The type of testing recommended for you will  depend on your family and medical history, your ethnicity, and your age. Testing may include:  Screening tests. These tests may include an ultrasound, blood tests, or a combination of  both. The blood tests are used to check for abnormal genes and the ultrasound is done to look for early birth defects.  Carrier screening. This test involves checking the blood or saliva of both parents to see if they carry abnormal genes that could be passed down to a baby. If genetic screening shows that your baby is at risk for a genetic defect, additional diagnostic testing may be recommended, such as:  Amniocentesis. This involves testing a sample of fluid from your womb (amniotic fluid).  Chorionic villus sampling. In this test, a sample of cells from your placenta is checked for abnormal cells. Unlike other tests done during pregnancy, diagnostic testing does have some risk for your pregnancy. Talk to your health care provider about the risks and benefits of genetic testing. Questions to ask your health care provider  What routine tests are recommended for me?  When and how will these tests be done?  When will I get the results of routine tests?  What do the results of these tests mean for me or my baby?  Do you recommend any genetic screening tests? Which ones?  Should I see a genetic counselor before having genetic screening? Where to find more information  American Pregnancy Association: americanpregnancy.org/prenatal-testing  [employer]ians and Gynecologists: JewelryExec.com.pt  Office on [employer]: KeywordPortfolios.com.br  March of Dimes: marchofdimes.org/pregnancy Summary  Having certain tests and screenings during pregnancy is an important part of your prenatal care. Talk to your health care provider about what tests are right for you and your baby.  In early pregnancy, testing may be done to check your risks for various conditions that can affect you and your  baby.  Later in pregnancy, tests may be done to ensure that your baby is growing normally and that you and your baby are staying healthy during the pregnancy.  Genetic testing is optional. Talk to your health care provider about the risks and benefits of genetic testing. This information is not intended to replace advice given to you by your health care provider. Make sure you discuss any questions you have with your health care provider. Document Revised: 06/15/2020 Document Reviewed: 06/15/2020 Elsevier Patient Education  [place]. WHAT OB PATIENTS CAN EXPECT   Confirmation of pregnancy and ultrasound ordered if medically indicated-[redacted] weeks gestation  New OB (NOB) intake with nurse and New OB (NOB) labs- [redacted] weeks gestation  New OB (NOB) physical examination with provider- 11/[redacted] weeks gestation  Flu vaccine-[redacted] weeks gestation  Anatomy scan-[redacted] weeks gestation  Glucose tolerance test, blood work to test for anemia, T-dap vaccine-[redacted] weeks gestation  Vaginal swabs/cultures-STD/Group B strep-[redacted] weeks gestation  Appointments every 4 weeks until 28 weeks  Every 2 weeks from 28 weeks until 36 weeks  Weekly visits from 36 weeks until delivery

## 2020-11-23 NOTE — Progress Notes (Signed)
   GYNECOLOGY CLINIC PROGRESS NOTE Subjective:    Sandra Wilkerson is a 25 y.o. G21P0000 female who presents for evaluation of amenorrhea. She believes she could be pregnant. Pregnancy is desired. She has been undergoing hormonal fertility treatment with Clomid for the past several months (recently started 100 mg dosing) for history of primary anovulatory infertility and PCOS. Sexual Activity: single partner, contraception: none. Current symptoms also include: breast tenderness, fatigue, morning sickness and positive home pregnancy test. Last period was normal.  Patient's last menstrual period was 10/06/2020.    The following portions of the patient's history were reviewed and updated as appropriate: allergies, current medications, past family history, past medical history, past social history, past surgical history and problem list.  Review of Systems Pertinent items noted in HPI and remainder of comprehensive ROS otherwise negative.     Objective:    BP 105/72   Pulse (!) 105   Ht 4\' 11"  (1.499 m)   Wt 187 lb 3.2 oz (84.9 kg)   LMP 10/06/2020   BMI 37.81 kg/m  General: alert, no distress and no acute distress    Lab Review Urine HCG: positive    Assessment:   Absence of menstruation. History of infertility PCOS H/o Chron's disease   Plan:   - Pregnancy Test: Positive: EDC: . Briefly discussed pre-natal care options. Pregnancy, Childbirth and the Newborn book given. Encouraged well-balanced diet, plenty of rest when needed, pre-natal vitamins daily and walking for exercise. Discussed self-help for nausea, avoiding OTC medications until consulting provider or pharmacist, other than Tylenol as needed, minimal caffeine (1-2 cups daily) and avoiding alcohol. She will schedule her initial OB intake next week along with viability scan, and her NOB physical in 4-5 weeks with her PCP or OB provider. Feel free to call with any questions.  - H/o Chron's disease, currently on Remicade  (Infliximab) therapy (monthly injections). Has had a history of peritoneal abscess and colostomy (with reversal). Reviewed medication, Infliximab has not been well studied for use in pregnancy, but the available data does not suggest a higher chance for birth defects. It apparently is compatible with use during conception and the first two trimesters of pregnancy. Many pregnant women will cease the drug about two months before giving birth to minimize transferring it to the baby. Patient will need to discuss with her GI provider.  - Can discontinue metformin along with her Provera and Comid at this time (used for ovulation induction).   10/08/2020, MD [facility]

## 2020-11-23 NOTE — Progress Notes (Signed)
PT is present today for confirmation of pregnancy. Pt LMP 10/06/2020. UPT done today results were positive. Pt stated that she is doing well no complaints. Safe medication list in pregnancy, first trimester information, and morning sickness information given to pt. Pt is aware that the information is in her mychart and can be printed off during check out.

## 2020-11-30 ENCOUNTER — Other Ambulatory Visit: Payer: BC Managed Care – PPO

## 2020-12-11 DIAGNOSIS — K50919 Crohn's disease, unspecified, with unspecified complications: Secondary | ICD-10-CM | POA: Diagnosis not present

## 2020-12-16 ENCOUNTER — Other Ambulatory Visit: Payer: BC Managed Care – PPO

## 2020-12-17 ENCOUNTER — Ambulatory Visit: Admission: RE | Admit: 2020-12-17 | Payer: BC Managed Care – PPO | Source: Ambulatory Visit

## 2020-12-20 ENCOUNTER — Ambulatory Visit
Admission: RE | Admit: 2020-12-20 | Discharge: 2020-12-20 | Disposition: A | Discharge status: Home / Self Care | Payer: BC Managed Care – PPO | Source: Ambulatory Visit | Attending: Obstetrics and Gynecology | Admitting: Obstetrics and Gynecology

## 2020-12-20 ENCOUNTER — Other Ambulatory Visit: Payer: Self-pay | Admitting: Obstetrics and Gynecology

## 2020-12-20 ENCOUNTER — Other Ambulatory Visit: Payer: Self-pay

## 2020-12-20 DIAGNOSIS — N926 Irregular menstruation, unspecified: Secondary | ICD-10-CM

## 2020-12-20 DIAGNOSIS — Z3A10 10 weeks gestation of pregnancy: Secondary | ICD-10-CM | POA: Diagnosis not present

## 2020-12-20 DIAGNOSIS — O208 Other hemorrhage in early pregnancy: Secondary | ICD-10-CM | POA: Diagnosis not present

## 2020-12-20 DIAGNOSIS — N970 Female infertility associated with anovulation: Secondary | ICD-10-CM

## 2020-12-21 ENCOUNTER — Ambulatory Visit (INDEPENDENT_AMBULATORY_CARE_PROVIDER_SITE_OTHER): Payer: BC Managed Care – PPO

## 2020-12-21 ENCOUNTER — Ambulatory Visit (INDEPENDENT_AMBULATORY_CARE_PROVIDER_SITE_OTHER): Payer: BC Managed Care – PPO | Admitting: Obstetrics and Gynecology

## 2020-12-21 ENCOUNTER — Other Ambulatory Visit: Payer: Self-pay

## 2020-12-21 ENCOUNTER — Encounter: Payer: Self-pay | Admitting: Obstetrics and Gynecology

## 2020-12-21 VITALS — BP 129/83 | HR 90 | Wt 183.6 lb

## 2020-12-21 VITALS — BP 118/77 | HR 103 | Ht 59.0 in | Wt 184.2 lb

## 2020-12-21 DIAGNOSIS — R638 Other symptoms and signs concerning food and fluid intake: Secondary | ICD-10-CM | POA: Diagnosis not present

## 2020-12-21 DIAGNOSIS — Z3401 Encounter for supervision of normal first pregnancy, first trimester: Secondary | ICD-10-CM

## 2020-12-21 DIAGNOSIS — R102 Pelvic and perineal pain: Secondary | ICD-10-CM

## 2020-12-21 DIAGNOSIS — Z113 Encounter for screening for infections with a predominantly sexual mode of transmission: Secondary | ICD-10-CM

## 2020-12-21 DIAGNOSIS — Z0283 Encounter for blood-alcohol and blood-drug test: Secondary | ICD-10-CM | POA: Diagnosis not present

## 2020-12-21 LAB — OB RESULTS CONSOLE VARICELLA ZOSTER ANTIBODY, IGG: Varicella: IMMUNE

## 2020-12-21 MED ORDER — PRENATE PIXIE 10-0.6-0.4-200 MG PO CAPS: 1.0000 | ORAL_CAPSULE | Freq: Every day | ORAL | 11 refills | Status: DC

## 2020-12-21 NOTE — Patient Instructions (Addendum)
WHAT OB PATIENTS CAN EXPECT ° °· Confirmation of pregnancy and ultrasound ordered if medically indicated-[redacted] weeks gestation °· New OB (NOB) intake with nurse and New OB (NOB) labs- [redacted] weeks gestation °· New OB (NOB) physical examination with provider- 11/[redacted] weeks gestation °· Flu vaccine-[redacted] weeks gestation °· Anatomy scan-[redacted] weeks gestation °· Glucose tolerance test, blood work to test for anemia, T-dap vaccine-[redacted] weeks gestation °· Vaginal swabs/cultures-STD/Group B strep-[redacted] weeks gestation °· Appointments every 4 weeks until 28 weeks °· Every 2 weeks from 28 weeks until 36 weeks °· Weekly visits from 36 weeks until delivery ° °Morning Sickness ° °Morning sickness is when you feel like you may vomit (feel nauseous) during pregnancy. Sometimes, you may vomit. Morning sickness most often happens in the morning, but it can also happen at any time of the day. Some women may have morning sickness that makes them vomit all the time. This is a more serious problem that needs treatment. °What are the causes? °The cause of this condition is not known. °What increases the risk? °· You had vomiting or a feeling like you may vomit before your pregnancy. °· You had morning sickness in another pregnancy. °· You are pregnant with more than one baby, such as twins. °What are the signs or symptoms? °· Feeling like you may vomit. °· Vomiting. °How is this treated? °Treatment is usually not needed for this condition. You may only need to change what you eat. In some cases, your doctor may give you some things to take for your condition. These include: °· Vitamin B6 supplements. °· Medicines to treat the feeling that you may vomit. °· Ginger. °Follow these instructions at home: °Medicines °· Take over-the-counter and prescription medicines only as told by your doctor. Do not take any medicines until you talk with your doctor about them first. °· Take multivitamins before you get pregnant. These can stop or lessen the symptoms of morning  sickness. °Eating and drinking °· Eat dry toast or crackers before getting out of bed. °· Eat 5 or 6 small meals a day. °· Eat dry and bland foods like rice and baked potatoes. °· Do not eat greasy, fatty, or spicy foods. °· Have someone cook for you if the smell of food causes you to vomit or to feel like you may vomit. °· If you feel like you may vomit after taking prenatal vitamins, take them at night or with a snack. °· Eat protein foods when you need a snack. Nuts, yogurt, and cheese are good choices. °· Drink fluids throughout the day. °· Try ginger ale made with real ginger, ginger tea made from fresh grated ginger, or ginger candies. °General instructions °· Do not smoke or use any products that contain nicotine or tobacco. If you need help quitting, ask your doctor. °· Use an air purifier to keep the air in your house free of smells. °· Get lots of fresh air. °· Try to avoid smells that make you feel sick. °· Try wearing an acupressure wristband. This is a wristband that is used to treat seasickness. °· Try a treatment called acupuncture. In this treatment, a doctor puts needles into certain areas of your body to make you feel better. °Contact a doctor if: °· You need medicine to feel better. °· You feel dizzy or light-headed. °· You are losing weight. °Get help right away if: °· The feeling that you may vomit will not go away, or you cannot stop vomiting. °· You faint. °· You have   You have very bad pain in your belly. Summary  Morning sickness is when you feel like you may vomit (feel nauseous) during pregnancy.  You may feel sick in the morning, but you can feel this way at any time of the day.  Making some changes to what you eat may help your symptoms go away. This information is not intended to replace advice given to you by your health care provider. Make sure you discuss any questions you have with your health care provider. Document Revised: 05/10/2020 Document Reviewed: 04/19/2020 Elsevier  Patient Education  2021 [place].  https://www.acog.org/womens-health/faqs/prenatal-genetic-screening-tests">  Prenatal Care Prenatal care is health care during pregnancy. It helps you and your unborn baby (fetus) stay as healthy as possible. Prenatal care may be provided by a midwife, a family practice doctor, a IT consultant (nurse practitioner or physician assistant), or a childbirth and pregnancy doctor (obstetrician). How does this affect me? During pregnancy, you will be closely monitored for any new conditions that might develop. To lower your risk of pregnancy complications, you and your health care provider will talk about any underlying conditions you have. How does this affect my baby? Early and consistent prenatal care increases the chance that your baby will be healthy during pregnancy. Prenatal care lowers the risk that your baby will be:  Born early (prematurely).  Smaller than expected at birth (small for gestational age). What can I expect at the first prenatal care visit? Your first prenatal care visit will likely be the longest. You should schedule your first prenatal care visit as soon as you know that you are pregnant. Your first visit is a good time to talk about any questions or concerns you have about pregnancy. Medical history At your visit, you and your health care provider will talk about your medical history, including:  Any past pregnancies.  Your family's medical history.  Medical history of the baby's father.  Any long-term (chronic) health conditions you have and how you manage them.  Any surgeries or procedures you have had.  Any current over-the-counter or prescription medicines, herbs, or supplements that you are taking.  Other factors that could pose a risk to your baby, including: ? Exposure to harmful chemicals or radiation at work or at home. ? Any substance use, including tobacco, alcohol, and drug use.  Your home setting and  your stress levels, including: ? Exposure to abuse or violence. ? Household financial strain.  Your daily health habits, including diet and exercise. Tests and screenings Your health care provider will:  Measure your weight, height, and blood pressure.  Do a physical exam, including a pelvic and breast exam.  Perform blood tests and urine tests to check for: ? Urinary tract infection. ? Sexually transmitted infections (STIs). ? Low iron levels in your blood (anemia). ? Blood type and certain proteins on red blood cells (Rh antibodies). ? Infections and immunity to viruses, such as hepatitis B and rubella. ? HIV (human immunodeficiency virus).  Discuss your options for genetic screening. Tips about staying healthy Your health care provider will also give you information about how to keep yourself and your baby healthy, including:  Nutrition and taking vitamins.  Physical activity.  How to manage pregnancy symptoms such as nausea and vomiting (morning sickness).  Infections and substances that may be harmful to your baby and how to avoid them.  Food safety.  Dental care.  Working.  Travel.  Warning signs to watch for and when to call your health care provider.  How often will I have prenatal care visits? After your first prenatal care visit, you will have regular visits throughout your pregnancy. The visit schedule is often as follows:  Up to week 28 of pregnancy: once every 4 weeks.  28-36 weeks: once every 2 weeks.  After 36 weeks: every week until delivery. Some women may have visits more or less often depending on any underlying health conditions and the health of the baby. Keep all follow-up and prenatal care visits. This is important. What happens during routine prenatal care visits? Your health care provider will:  Measure your weight and blood pressure.  Check for fetal heart sounds.  Measure the height of your uterus in your abdomen (fundal height).  This may be measured starting around week 20 of pregnancy.  Check the position of your baby inside your uterus.  Ask questions about your diet, sleeping patterns, and whether you can feel the baby move.  Review warning signs to watch for and signs of labor.  Ask about any pregnancy symptoms you are having and how you are dealing with them. Symptoms may include: ? Headaches. ? Nausea and vomiting. ? Vaginal discharge. ? Swelling. ? Fatigue. ? Constipation. ? Changes in your vision. ? Feeling persistently sad or anxious. ? Any discomfort, including back or pelvic pain. ? Bleeding or spotting. Make a list of questions to ask your health care provider at your routine visits.   What tests might I have during prenatal care visits? You may have blood, urine, and imaging tests throughout your pregnancy, such as:  Urine tests to check for glucose, protein, or signs of infection.  Glucose tests to check for a form of diabetes that can develop during pregnancy (gestational diabetes mellitus). This is usually done around week 24 of pregnancy.  Ultrasounds to check your baby's growth and development, to check for birth defects, and to check your baby's well-being. These can also help to decide when you should deliver your baby.  A test to check for group B strep (GBS) infection. This is usually done around week 36 of pregnancy.  Genetic testing. This may include blood, fluid, or tissue sampling, or imaging tests, such as an ultrasound. Some genetic tests are done during the first trimester and some are done during the second trimester. What else can I expect during prenatal care visits? Your health care provider may recommend getting certain vaccines during pregnancy. These may include:  A yearly flu shot (annual influenza vaccine). This is especially important if you will be pregnant during flu season.  Tdap (tetanus, diphtheria, pertussis) vaccine. Getting this vaccine during pregnancy can  protect your baby from whooping cough (pertussis) after birth. This vaccine may be recommended between weeks 27 and 36 of pregnancy.  A COVID-19 vaccine. Later in your pregnancy, your health care provider may give you information about:  Childbirth and breastfeeding classes.  Choosing a health care provider for your baby.  Umbilical cord banking.  Breastfeeding.  Birth control after your baby is born.  The hospital labor and delivery unit and how to set up a tour.  Registering at the hospital before you go into labor. Where to find more information  Office on Women's Health: LegalWarrants.gl  American Pregnancy Association: americanpregnancy.org  March of Dimes: marchofdimes.org Summary  Prenatal care helps you and your baby stay as healthy as possible during pregnancy.  Your first prenatal care visit will most likely be the longest.  You will have visits and tests throughout your pregnancy to monitor  your health and your baby's health.  Bring a list of questions to your visits to ask your health care provider.  Make sure to keep all follow-up and prenatal care visits. This information is not intended to replace advice given to you by your health care provider. Make sure you discuss any questions you have with your health care provider. Document Revised: 07/08/2020 Document Reviewed: 07/08/2020 Elsevier Patient Education  2021 [employer]. AboveDiscount.com.cy.html">  First Trimester of Pregnancy  The first trimester of pregnancy starts on the first day of your last menstrual period until the end of week 12. This is also called months 1 through 3 of pregnancy. Body changes during your first trimester Your body goes through many changes during pregnancy. The changes usually return to normal after your baby is born. Physical changes  You may gain or lose weight.  Your breasts may grow larger and hurt. The area around your nipples may get  darker.  Dark spots or blotches may develop on your face.  You may have changes in your hair. Health changes  You may feel like you might vomit (nauseous), and you may vomit.  You may have heartburn.  You may have headaches.  You may have trouble pooping (constipation).  Your gums may bleed. Other changes  You may get tired easily.  You may pee (urinate) more often.  Your menstrual periods will stop.  You may not feel hungry.  You may want to eat certain kinds of food.  You may have changes in your emotions from day to day.  You may have more dreams. Follow these instructions at home: Medicines  Take over-the-counter and prescription medicines only as told by your doctor. Some medicines are not safe during pregnancy.  Take a prenatal vitamin that contains at least 600 micrograms (mcg) of folic acid. Eating and drinking  Eat healthy meals that include: ? Fresh fruits and vegetables. ? Whole grains. ? Good sources of protein, such as meat, eggs, or tofu. ? Low-fat dairy products.  Avoid raw meat and unpasteurized juice, milk, and cheese.  If you feel like you may vomit, or you vomit: ? Eat 4 or 5 small meals a day instead of 3 large meals. ? Try eating a few soda crackers. ? Drink liquids between meals instead of during meals.  You may need to take these actions to prevent or treat trouble pooping: ? Drink enough fluids to keep your pee (urine) pale yellow. ? Eat foods that are high in fiber. These include beans, whole grains, and fresh fruits and vegetables. ? Limit foods that are high in fat and sugar. These include fried or sweet foods. Activity  Exercise only as told by your doctor. Most people can do their usual exercise routine during pregnancy.  Stop exercising if you have cramps or pain in your lower belly (abdomen) or low back.  Do not exercise if it is too hot or too humid, or if you are in a place of great height (high altitude).  Avoid heavy  lifting.  If you choose to, you may have sex unless your doctor tells you not to. Relieving pain and discomfort  Wear a good support bra if your breasts are sore.  Rest with your legs raised (elevated) if you have leg cramps or low back pain.  If you have bulging veins (varicose veins) in your legs: ? Wear support hose as told by your doctor. ? Raise your feet for 15 minutes, 3-4 times a day. ? Limit  salt in your food. Safety  Wear your seat belt at all times when you are in a car.  Talk with your doctor if someone is hurting you or yelling at you.  Talk with your doctor if you are feeling sad or have thoughts of hurting yourself. Lifestyle  Do not use hot tubs, steam rooms, or saunas.  Do not douche. Do not use tampons or scented sanitary pads.  Do not use herbal medicines, illegal drugs, or medicines that are not approved by your doctor. Do not drink alcohol.  Do not smoke or use any products that contain nicotine or tobacco. If you need help quitting, ask your doctor.  Avoid cat litter boxes and soil that is used by cats. These carry germs that can cause harm to the baby and can cause a loss of your baby by miscarriage or stillbirth. General instructions  Keep all follow-up visits. This is important.  Ask for help if you need counseling or if you need help with nutrition. Your doctor can give you advice or tell you where to go for help.  Visit your dentist. At home, brush your teeth with a soft toothbrush. Floss gently.  Write down your questions. Take them to your prenatal visits. Where to find more information  American Pregnancy Association: americanpregnancy.org  [employer] of Obstetricians and Gynecologists: www.acog.org  Office on Women's Health: KeywordPortfolios.com.br Contact a doctor if:  You are dizzy.  You have a fever.  You have mild cramps or pressure in your lower belly.  You have a nagging pain in your belly area.  You continue to  feel like you may vomit, you vomit, or you have watery poop (diarrhea) for 24 hours or longer.  You have a bad-smelling fluid coming from your vagina.  You have pain when you pee.  You are exposed to a disease that spreads from person to person, such as chickenpox, measles, Zika virus, HIV, or hepatitis. Get help right away if:  You have spotting or bleeding from your vagina.  You have very bad belly cramping or pain.  You have shortness of breath or chest pain.  You have any kind of injury, such as from a fall or a car crash.  You have new or increased pain, swelling, or redness in an arm or leg. Summary  The first trimester of pregnancy starts on the first day of your last menstrual period until the end of week 12 (months 1 through 3).  Eat 4 or 5 small meals a day instead of 3 large meals.  Do not smoke or use any products that contain nicotine or tobacco. If you need help quitting, ask your doctor.  Keep all follow-up visits. This information is not intended to replace advice given to you by your health care provider. Make sure you discuss any questions you have with your health care provider. Document Revised: 03/03/2020 Document Reviewed: 01/08/2020 Elsevier Patient Education  2021 [place]. Commonly Asked Questions During Pregnancy  Cats: A parasite can be excreted in cat feces.  To avoid exposure you need to have another person empty the little box.  If you must empty the litter box you will need to wear gloves.  Wash your hands after handling your cat.  This parasite can also be found in raw or undercooked meat so this should also be avoided.  Colds, Sore Throats, Flu: Please check your medication sheet to see what you can take for symptoms.  If your symptoms are unrelieved by these  medications please call the office.  Dental Work: Most any dental work Investment banker, corporate recommends is permitted.  X-rays should only be taken during the first trimester if absolutely  necessary.  Your abdomen should be shielded with a lead apron during all x-rays.  Please notify your provider prior to receiving any x-rays.  Novocaine is fine; gas is not recommended.  If your dentist requires a note from [place] prior to dental work please call the office and we will provide one for you.  Exercise: Exercise is an important part of staying healthy during your pregnancy.  You may continue most exercises you were accustomed to prior to pregnancy.  Later in your pregnancy you will most likely notice you have difficulty with activities requiring balance like riding a bicycle.  It is important that you listen to your body and avoid activities that put you at a higher risk of falling.  Adequate rest and staying well hydrated are a must!  If you have questions about the safety of specific activities ask your provider.    Exposure to Children with illness: Try to avoid obvious exposure; report any symptoms to [place] when noted,  If you have chicken pos, red measles or mumps, you should be immune to these diseases.   Please do not take any vaccines while pregnant unless you have checked with your OB provider.  Fetal Movement: After 28 weeks we recommend you do "kick counts" twice daily.  Lie or sit down in a calm quiet environment and count your baby movements "kicks".  You should feel your baby at least 10 times per hour.  If you have not felt 10 kicks within the first hour get up, walk around and have something sweet to eat or drink then repeat for an additional hour.  If count remains less than 10 per hour notify your provider.  Fumigating: Follow your pest control agent's advice as to how long to stay out of your home.  Ventilate the area well before re-entering.  Hemorrhoids:   Most over-the-counter preparations can be used during pregnancy.  Check your medication to see what is safe to use.  It is important to use a stool softener or fiber in your diet and to drink lots of liquids.  If hemorrhoids  seem to be getting worse please call the office.   Hot Tubs:  Hot tubs Jacuzzis and saunas are not recommended while pregnant.  These increase your internal body temperature and should be avoided.  Intercourse:  Sexual intercourse is safe during pregnancy as long as you are comfortable, unless otherwise advised by your provider.  Spotting may occur after intercourse; report any bright red bleeding that is heavier than spotting.  Labor:  If you know that you are in labor, please go to the hospital.  If you are unsure, please call the office and let us help you decide what to do.  Lifting, straining, etc:  If your job requires heavy lifting or straining please check with your provider for any limitations.  Generally, you should not lift items heavier than that you can lift simply with your hands and arms (no back muscles)  Painting:  Paint fumes do not harm your pregnancy, but may make you ill and should be avoided if possible.  Latex or water based paints have less odor than oils.  Use adequate ventilation while painting.  Permanents & Hair Color:  Chemicals in hair dyes are not recommended as they cause increase hair dryness which can increase hair  loss during pregnancy.  " Highlighting" and permanents are allowed.  Dye may be absorbed differently and permanents may not hold as well during pregnancy.  Sunbathing:  Use a sunscreen, as skin burns easily during pregnancy.  Drink plenty of fluids; avoid over heating.  Tanning Beds:  Because their possible side effects are still unknown, tanning beds are not recommended.  Ultrasound Scans:  Routine ultrasounds are performed at approximately 20 weeks.  You will be able to see your baby's general anatomy an if you would like to know the gender this can usually be determined as well.  If it is questionable when you conceived you may also receive an ultrasound early in your pregnancy for dating purposes.  Otherwise ultrasound exams are not routinely  performed unless there is a medical necessity.  Although you can request a scan we ask that you pay for it when conducted because insurance does not cover " patient request" scans.  Work: If your pregnancy proceeds without complications you may work until your due date, unless your physician or employer advises otherwise.  Round Ligament Pain/Pelvic Discomfort:  Sharp, shooting pains not associated with bleeding are fairly common, usually occurring in the second trimester of pregnancy.  They tend to be worse when standing up or when you remain standing for long periods of time.  These are the result of pressure of certain pelvic ligaments called "round ligaments".  Rest, Tylenol and heat seem to be the most effective relief.  As the womb and fetus grow, they rise out of the pelvis and the discomfort improves.  Please notify the office if your pain seems different than that described.  It may represent a more serious condition.  Common Medications Safe in Pregnancy  Acne:      Constipation:  Benzoyl Peroxide     Colace  Clindamycin      Dulcolax Suppository  Topica Erythromycin     Fibercon  Salicylic Acid      Metamucil         Miralax AVOID:        Senakot   Accutane    Cough:  Retin-A       Cough Drops  Tetracycline      Phenergan w/ Codeine if Rx  Minocycline      Robitussin (Plain & DM)  Antibiotics:     Crabs/Lice:  Ceclor       RID  Cephalosporins    AVOID:  E-Mycins      Kwell  Keflex  Macrobid/Macrodantin   Diarrhea:  Penicillin      Kao-Pectate  Zithromax      Imodium AD         PUSH FLUIDS AVOID:       Cipro     Fever:  Tetracycline      Tylenol (Regular or Extra  Minocycline       Strength)  Levaquin      Extra Strength-Do not          Exceed 8 tabs/24 hrs Caffeine:        '200mg'$ /day (equiv. To 1 cup of coffee or  approx. 3 12 oz  sodas)         Gas: Cold/Hayfever:       Gas-X  Benadryl      Mylicon  Claritin       Phazyme  **Claritin-D        Chlor-Trimeton    Headaches:  Dimetapp      ASA-Free Excedrin  Drixoral-Non-Drowsy  Mucinex (Guaifenasin)     Tylenol (Regular or Extra  Sudafed/Sudafed-12 Hour     Strength)  **Sudafed PE Pseudoephedrine   Tylenol Cold & Sinus     Vicks Vapor Rub  Zyrtec  **AVOID if Problems With Blood Pressure         Heartburn: Avoid lying down for at least 1 hour after meals  Aciphex      Maalox     Rash:  Milk of Magnesia     Benadryl    Mylanta       1% Hydrocortisone Cream  Pepcid  Pepcid Complete   Sleep Aids:  Prevacid      Ambien   Prilosec       Benadryl  Rolaids       Chamomile Tea  Tums (Limit 4/day)     Unisom         Tylenol PM         Warm milk-add vanilla or  Hemorrhoids:       Sugar for taste  Anusol/Anusol H.C.  (RX: Analapram 2.5%)  Sugar Substitutes:  Hydrocortisone OTC     Ok in moderation  Preparation H      Tucks        Vaseline lotion applied to tissue with wiping    Herpes:     Throat:  Acyclovir      Oragel  Famvir  Valtrex     Vaccines:         Flu Shot Leg Cramps:       *Gardasil  Benadryl      Hepatitis A         Hepatitis B Nasal Spray:       Pneumovax  Saline Nasal Spray     Polio Booster         Tetanus Nausea:       Tuberculosis test or PPD  Vitamin B6 25 mg TID   AVOID:    Dramamine      *Gardasil  Emetrol       Live Poliovirus  Ginger Root 250 mg QID    MMR (measles, mumps &  High Complex Carbs @ Bedtime    rebella)  Sea Bands-Accupressure    Varicella (Chickenpox)  Unisom 1/2 tab TID     *No known complications           If received before Pain:         Known pregnancy;   Darvocet       Resume series after  Lortab        Delivery  Percocet    Yeast:   Tramadol      Femstat  Tylenol 3      Gyne-lotrimin  Ultram       Monistat  Vicodin           MISC:         All Sunscreens           Hair  Coloring/highlights          Insect Repellant's          (Including DEET)         Mystic Tans  

## 2020-12-21 NOTE — Progress Notes (Signed)
ROB: Patient called today stating she was having pelvic cramping.  Since her call she has taken some Tums and her cramping has returned resolved.  She had an ultrasound yesterday which reveals a 10-week 5-day intrauterine pregnancy without issue.  Small subchorionic hemorrhage was noted. (Patient has evidence no vaginal bleeding).  We spoke at some length today and she is reassured by her ultrasound and the fact that her cramping has now resolved.  She is scheduled for a nurse visit later today.  I spent 12 minutes involved in the care of this patient preparing to see the patient by obtaining and reviewing her medical history (including labs, imaging tests and prior procedures), documenting clinical information in the electronic health record (EHR), counseling and coordinating care plans, writing and sending prescriptions, ordering tests or procedures and directly communicating with the patient by discussing pertinent items from her history and physical exam as well as detailing my assessment and plan as noted above so that she has an informed understanding.  All of her questions were answered.

## 2020-12-21 NOTE — Progress Notes (Signed)
      Sandra Wilkerson presents for NOB nurse intake visit. Pregnancy confirmation done at [facility], 11/23/2020, with Dr. Hildred Laser, MD.  G 1.  P 0.  LMP 10/06/2020 .  EDD .  [place]. Pregnancy education material explained and given.  0 cats in the home.  NOB labs ordered. BMI greater than 30. TSH/HbgA1c. Sickle cell order due to race. HIV and drug screen explained and ordered. Genetic screening discussed. Genetic testing; Unsure. Pt to discuss genetic testing with provider. PNV encouraged. Pt to follow up with provider in 2 .weeks for NOB physical. FMLA, HIV/Drug Screening form and [facility] Financial Policy reviewed and signed by pt.

## 2020-12-22 LAB — RUBELLA SCREEN: Rubella Antibodies, IGG: 14.4 index (ref >0.99)

## 2020-12-22 LAB — URINALYSIS, ROUTINE W REFLEX MICROSCOPIC
Bilirubin, UA: NEGATIVE
Glucose, UA: NEGATIVE
Leukocytes,UA: NEGATIVE
Nitrite, UA: NEGATIVE
RBC, UA: NEGATIVE
Specific Gravity, UA: 1.029 (ref 1.005–1.030)
Urobilinogen, Ur: 1 mg/dL (ref 0.2–1.0)
pH, UA: 6.5 (ref 5.0–7.5)

## 2020-12-22 LAB — MICROSCOPIC EXAMINATION
Casts: NONE SEEN /lpf
Epithelial Cells (non renal): >10 /hpf — AB (ref 0–10)

## 2020-12-22 LAB — DRUG PROFILE, UR, 9 DRUGS (LABCORP)
Amphetamines, Urine: NEGATIVE ng/mL
Barbiturate Quant, Ur: NEGATIVE ng/mL
Benzodiazepine Quant, Ur: NEGATIVE ng/mL
Cannabinoid Quant, Ur: NEGATIVE ng/mL
Cocaine (Metab.): NEGATIVE ng/mL
Methadone Screen, Urine: NEGATIVE ng/mL
Opiate Quant, Ur: NEGATIVE ng/mL
PCP Quant, Ur: NEGATIVE ng/mL
Propoxyphene: NEGATIVE ng/mL

## 2020-12-22 LAB — HEMOGLOBIN A1C
Est. average glucose Bld gHb Est-mCnc: 108 mg/dL
Hgb A1c MFr Bld: 5.4 % (ref 4.8–5.6)

## 2020-12-22 LAB — TSH: TSH: 0.319 u[IU]/mL — ABNORMAL LOW (ref 0.450–4.500)

## 2020-12-22 LAB — ABO AND RH: Rh Factor: NEGATIVE

## 2020-12-22 LAB — ANTIBODY SCREEN: Antibody Screen: NEGATIVE

## 2020-12-22 LAB — NICOTINE SCREEN, URINE: Cotinine Ql Scrn, Ur: NEGATIVE ng/mL

## 2020-12-22 LAB — VIRAL HEPATITIS HBV, HCV
HCV Ab: <0.1 s/co ratio (ref 0.0–0.9)
Hep B Core Total Ab: NEGATIVE
Hep B Surface Ab, Qual: NONREACTIVE
Hepatitis B Surface Ag: NEGATIVE

## 2020-12-22 LAB — HGB SOLU + RFLX FRAC: Sickle Solubility Test - HGBRFX: NEGATIVE

## 2020-12-22 LAB — RPR: RPR Ser Ql: NONREACTIVE

## 2020-12-22 LAB — HIV ANTIBODY (ROUTINE TESTING W REFLEX): HIV Screen 4th Generation wRfx: NONREACTIVE

## 2020-12-22 LAB — HCV INTERPRETATION

## 2020-12-22 LAB — VARICELLA ZOSTER ANTIBODY, IGG: Varicella zoster IgG: 494 index (ref >165)

## 2020-12-23 LAB — CULTURE, OB URINE

## 2020-12-23 LAB — GC/CHLAMYDIA PROBE AMP
Chlamydia trachomatis, NAA: NEGATIVE
Neisseria Gonorrhoeae by PCR: NEGATIVE

## 2020-12-23 LAB — URINE CULTURE, OB REFLEX

## 2020-12-29 ENCOUNTER — Other Ambulatory Visit (HOSPITAL_COMMUNITY)
Admission: RE | Admit: 2020-12-29 | Discharge: 2020-12-29 | Disposition: A | Discharge status: Home / Self Care | Payer: BC Managed Care – PPO | Source: Ambulatory Visit | Attending: Obstetrics and Gynecology | Admitting: Obstetrics and Gynecology

## 2020-12-29 ENCOUNTER — Other Ambulatory Visit: Payer: Self-pay

## 2020-12-29 ENCOUNTER — Encounter: Payer: Self-pay | Admitting: Obstetrics and Gynecology

## 2020-12-29 ENCOUNTER — Ambulatory Visit (INDEPENDENT_AMBULATORY_CARE_PROVIDER_SITE_OTHER): Payer: BC Managed Care – PPO | Admitting: Obstetrics and Gynecology

## 2020-12-29 VITALS — BP 118/78 | HR 73 | Ht 59.0 in | Wt 184.9 lb

## 2020-12-29 DIAGNOSIS — Z124 Encounter for screening for malignant neoplasm of cervix: Secondary | ICD-10-CM | POA: Diagnosis not present

## 2020-12-29 DIAGNOSIS — O468X1 Other antepartum hemorrhage, first trimester: Secondary | ICD-10-CM

## 2020-12-29 DIAGNOSIS — Z8742 Personal history of other diseases of the female genital tract: Secondary | ICD-10-CM | POA: Insufficient documentation

## 2020-12-29 DIAGNOSIS — E669 Obesity, unspecified: Secondary | ICD-10-CM | POA: Insufficient documentation

## 2020-12-29 DIAGNOSIS — Z6791 Unspecified blood type, Rh negative: Secondary | ICD-10-CM

## 2020-12-29 DIAGNOSIS — Z3401 Encounter for supervision of normal first pregnancy, first trimester: Secondary | ICD-10-CM | POA: Diagnosis not present

## 2020-12-29 DIAGNOSIS — K50119 Crohn's disease of large intestine with unspecified complications: Secondary | ICD-10-CM

## 2020-12-29 DIAGNOSIS — Z3A12 12 weeks gestation of pregnancy: Secondary | ICD-10-CM

## 2020-12-29 DIAGNOSIS — O26899 Other specified pregnancy related conditions, unspecified trimester: Secondary | ICD-10-CM

## 2020-12-29 DIAGNOSIS — Z9049 Acquired absence of other specified parts of digestive tract: Secondary | ICD-10-CM

## 2020-12-29 DIAGNOSIS — O418X10 Other specified disorders of amniotic fluid and membranes, first trimester, not applicable or unspecified: Secondary | ICD-10-CM | POA: Insufficient documentation

## 2020-12-29 DIAGNOSIS — Z3481 Encounter for supervision of other normal pregnancy, first trimester: Secondary | ICD-10-CM | POA: Diagnosis not present

## 2020-12-29 DIAGNOSIS — E282 Polycystic ovarian syndrome: Secondary | ICD-10-CM | POA: Insufficient documentation

## 2020-12-29 DIAGNOSIS — Z87898 Personal history of other specified conditions: Secondary | ICD-10-CM | POA: Insufficient documentation

## 2020-12-29 NOTE — Patient Instructions (Signed)
WHAT OB PATIENTS CAN EXPECT   Confirmation of pregnancy and ultrasound ordered if medically indicated-[redacted] weeks gestation  New OB (NOB) intake with nurse and New OB (NOB) labs- [redacted] weeks gestation  New OB (NOB) physical examination with provider- 11/[redacted] weeks gestation  Flu vaccine-[redacted] weeks gestation  Anatomy scan-[redacted] weeks gestation  Glucose tolerance test, blood work to test for anemia, T-dap vaccine-[redacted] weeks gestation  Vaginal swabs/cultures-STD/Group B strep-[redacted] weeks gestation  Appointments every 4 weeks until 28 weeks  Every 2 weeks from 28 weeks until 36 weeks  Weekly visits from 36 weeks until delivery  Common Medications Safe in Pregnancy  Acne:      Constipation:  Benzoyl Peroxide     Colace  Clindamycin      Dulcolax Suppository  Topica Erythromycin     Fibercon  Salicylic Acid      Metamucil         Miralax AVOID:        Senakot   Accutane    Cough:  Retin-A       Cough Drops  Tetracycline      Phenergan w/ Codeine if Rx  Minocycline      Robitussin (Plain & DM)  Antibiotics:     Crabs/Lice:  Ceclor       RID  Cephalosporins    AVOID:  E-Mycins      Kwell  Keflex  Macrobid/Macrodantin   Diarrhea:  Penicillin      Kao-Pectate  Zithromax      Imodium AD         PUSH FLUIDS AVOID:       Cipro     Fever:  Tetracycline      Tylenol (Regular or Extra  Minocycline       Strength)  Levaquin      Extra Strength-Do not          Exceed 8 tabs/24 hrs Caffeine:        <256m/day (equiv. To 1 cup of coffee or  approx. 3 12 oz sodas)         Gas: Cold/Hayfever:       Gas-X  Benadryl      Mylicon  Claritin       Phazyme  **Claritin-D        Chlor-Trimeton    Headaches:  Dimetapp      ASA-Free Excedrin  Drixoral-Non-Drowsy     Cold Compress  Mucinex (Guaifenasin)     Tylenol (Regular or Extra  Sudafed/Sudafed-12 Hour     Strength)  **Sudafed PE Pseudoephedrine   Tylenol Cold & Sinus     Vicks Vapor Rub  Zyrtec  **AVOID if Problems With Blood  Pressure         Heartburn: Avoid lying down for at least 1 hour after meals  Aciphex      Maalox     Rash:  Milk of Magnesia     Benadryl    Mylanta       1% Hydrocortisone Cream  Pepcid  Pepcid Complete   Sleep Aids:  Prevacid      Ambien   Prilosec       Benadryl  Rolaids       Chamomile Tea  Tums (Limit 4/day)     Unisom         Tylenol PM         Warm milk-add vanilla or  Hemorrhoids:       Sugar for taste  Anusol/Anusol H.C.  (RX: Analapram 2.5%)  Analapram 2.5%)  Sugar Substitutes:  Hydrocortisone OTC     Ok in moderation  Preparation H      Tucks        Vaseline lotion applied to tissue with wiping    Herpes:     Throat:  Acyclovir      Oragel  Famvir  Valtrex     Vaccines:         Flu Shot Leg Cramps:       *Gardasil  Benadryl      Hepatitis A         Hepatitis B Nasal Spray:       Pneumovax  Saline Nasal Spray     Polio Booster         Tetanus Nausea:       Tuberculosis test or PPD  Vitamin B6 25 mg TID   AVOID:    Dramamine      *Gardasil  Emetrol       Live Poliovirus  Ginger Root 250 mg QID    MMR (measles, mumps &  High Complex Carbs @ Bedtime    rebella)  Sea Bands-Accupressure    Varicella (Chickenpox)  Unisom 1/2 tab TID     *No known complications           If received before Pain:         Known pregnancy;   Darvocet       Resume series after  Lortab        Delivery  Percocet    Yeast:   Tramadol      Femstat  Tylenol 3      Gyne-lotrimin  Ultram       Monistat  Vicodin           MISC:         All Sunscreens           Hair Coloring/highlights          Insect Repellant's          (Including DEET)         Mystic Tans   Second Trimester of Pregnancy  The second trimester of pregnancy is from week 13 through week 27. This is also called months 4 through 6 of pregnancy. This is often the time when you feel your best. During the second trimester:  Morning sickness is less or has stopped.  You may have more energy.  You may feel hungry more  often. At this time, your unborn baby (fetus) is growing very fast. At the end of the sixth month, the unborn baby may be up to 12 inches long and weigh about 1 pounds. You will likely start to feel the baby move between 16 and 20 weeks of pregnancy. Body changes during your second trimester Your body continues to go through many changes during this time. The changes vary and generally return to normal after the baby is born. Physical changes  You will gain more weight.  You may start to get stretch marks on your hips, belly (abdomen), and breasts.  Your breasts will grow and may hurt.  Dark spots or blotches may develop on your face.  A dark line from your belly button to the pubic area (linea nigra) may appear.  You may have changes in your hair. Health changes  You may have headaches.  You may have heartburn.  You may have trouble pooping (constipation).  You may have hemorrhoids or swollen, bulging veins (varicose veins).  Your   gums may bleed.  You may pee (urinate) more often.  You may have back pain. Follow these instructions at home: Medicines  Take over-the-counter and prescription medicines only as told by your doctor. Some medicines are not safe during pregnancy.  Take a prenatal vitamin that contains at least 600 micrograms (mcg) of folic acid. Eating and drinking  Eat healthy meals that include: ? Fresh fruits and vegetables. ? Whole grains. ? Good sources of protein, such as meat, eggs, or tofu. ? Low-fat dairy products.  Avoid raw meat and unpasteurized juice, milk, and cheese.  You may need to take these actions to prevent or treat trouble pooping: ? Drink enough fluids to keep your pee (urine) pale yellow. ? Eat foods that are high in fiber. These include beans, whole grains, and fresh fruits and vegetables. ? Limit foods that are high in fat and sugar. These include fried or sweet foods. Activity  Exercise only as told by your doctor. Most  people can do their usual exercise during pregnancy. Try to exercise for 30 minutes at least 5 days a week.  Stop exercising if you have pain or cramps in your belly or lower back.  Do not exercise if it is too hot or too humid, or if you are in a place of great height (high altitude).  Avoid heavy lifting.  If you choose to, you may have sex unless your doctor tells you not to. Relieving pain and discomfort  Wear a good support bra if your breasts are sore.  Take warm water baths (sitz baths) to soothe pain or discomfort caused by hemorrhoids. Use hemorrhoid cream if your doctor approves.  Rest with your legs raised (elevated) if you have leg cramps or low back pain.  If you develop bulging veins in your legs: ? Wear support hose as told by your doctor. ? Raise your feet for 15 minutes, 3-4 times a day. ? Limit salt in your food. Safety  Wear your seat belt at all times when you are in a car.  Talk with your doctor if someone is hurting you or yelling at you a lot. Lifestyle  Do not use hot tubs, steam rooms, or saunas.  Do not douche. Do not use tampons or scented sanitary pads.  Avoid cat litter boxes and soil used by cats. These carry germs that can harm your baby and can cause a loss of your baby by miscarriage or stillbirth.  Do not use herbal medicines, illegal drugs, or medicines that are not approved by your doctor. Do not drink alcohol.  Do not smoke or use any products that contain nicotine or tobacco. If you need help quitting, ask your doctor. General instructions  Keep all follow-up visits. This is important.  Ask your doctor about local prenatal classes.  Ask your doctor about the right foods to eat or for help finding a counselor. Where to find more information  American Pregnancy Association: americanpregnancy.org  American College of Obstetricians and Gynecologists: www.acog.org  Office on Women's Health: womenshealth.gov/pregnancy Contact a doctor  if:  You have a headache that does not go away when you take medicine.  You have changes in how you see, or you see spots in front of your eyes.  You have mild cramps, pressure, or pain in your lower belly.  You continue to feel like you may vomit (nauseous), you vomit, or you have watery poop (diarrhea).  You have bad-smelling fluid coming from your vagina.  You have pain when you   pee or your pee smells bad.  You have very bad swelling of your face, hands, ankles, feet, or legs.  You have a fever. Get help right away if:  You are leaking fluid from your vagina.  You have spotting or bleeding from your vagina.  You have very bad belly cramping or pain.  You have trouble breathing.  You have chest pain.  You faint.  You have not felt your baby move for the time period told by your doctor.  You have new or increased pain, swelling, or redness in an arm or leg. Summary  The second trimester of pregnancy is from week 13 through week 27 (months 4 through 6).  Eat healthy meals.  Exercise as told by your doctor. Most people can do their usual exercise during pregnancy.  Do not use herbal medicines, illegal drugs, or medicines that are not approved by your doctor. Do not drink alcohol.  Call your doctor if you get sick or if you notice anything unusual about your pregnancy. This information is not intended to replace advice given to you by your health care provider. Make sure you discuss any questions you have with your health care provider. Document Revised: 03/03/2020 Document Reviewed: 01/08/2020 Elsevier Patient Education  2021 Elsevier Inc.  

## 2020-12-29 NOTE — Progress Notes (Signed)
OBSTETRIC INITIAL PRENATAL VISIT  Subjective:    Sandra Wilkerson is being seen today for her first obstetrical visit.  This is a planned pregnancy.  She is a 25 y.o. G1P0000 female at [redacted]w[redacted]d gestation, Estimated Date of Delivery: 07/13/21 with Patient's last menstrual period was 10/06/2020.,  consistent with 10 week sono. She has a previous h/o PCOS, primary infertility, conceived this pregnancy with Clomid. She has has a past medical history of obesity and Chron's disease, s/p partial colectomy with appendectomy in 2012.  Daffney is also Rh negative.  Relationship with FOB: significant other, living together. Patient is unsure if she intends to breast feed. Pregnancy history fully reviewed.    OB History  Gravida Para Term Preterm AB Living  1 0 0 0 0 0  SAB IAB Ectopic Multiple Live Births  0 0 0 0 0    # Outcome Date GA Lbr Len/2nd Weight Sex Delivery Anes PTL Lv  1 Current             Gynecologic History:  Last pap smear was: patient cannot recall (possibly 2 or 3 years ago).  Results were normal per patient.  Denies h/o abnormal pap smears in the past.  Denies history of STIs.  Contraception: none   Past Medical History:  Diagnosis Date  . Crohn disease (HCC)     Family History  Problem Relation Age of Onset  . Healthy Mother   . Asthma Father   . Diabetes Maternal Grandmother     Past Surgical History:  Procedure Laterality Date  . APPENDECTOMY    . COLECTOMY    . COLOSTOMY    . COLOSTOMY REVERSAL      Social History   Socioeconomic History  . Marital status: Single    Spouse name: Not on file  . Number of children: Not on file  . Years of education: Not on file  . Highest education level: Not on file  Occupational History  . Not on file  Tobacco Use  . Smoking status: Never Smoker  . Smokeless tobacco: Never Used  Vaping Use  . Vaping Use: Never used  Substance and Sexual Activity  . Alcohol use: No  . Drug use: No  . Sexual activity: Yes     Birth control/protection: None  Other Topics Concern  . Not on file  Social History Narrative  . Not on file   Social Determinants of Health   Financial Resource Strain: Not on file  Food Insecurity: Not on file  Transportation Needs: Not on file  Physical Activity: Not on file  Stress: Not on file  Social Connections: Not on file  Intimate Partner Violence: Not on file    Current Outpatient Medications on File Prior to Visit  Medication Sig Dispense Refill  . inFLIXimab (REMICADE) 100 MG injection Inject into the vein.    . Prenat-FeAsp-Meth-FA-DHA w/o A (PRENATE PIXIE) 10-0.6-0.4-200 MG CAPS Take 1 tablet by mouth daily. 30 capsule 11   No current facility-administered medications on file prior to visit.    No Known Allergies   Review of Systems General: Not Present- Fever, Weight Loss and Weight Gain. Skin: Not Present- Rash. HEENT: Not Present- Blurred Vision, Headache and Bleeding Gums. Respiratory: Not Present- Difficulty Breathing. Breast: Not Present- Breast Mass. Cardiovascular: Not Present- Chest Pain, Elevated Blood Pressure, Fainting / Blacking Out and Shortness of Breath. Gastrointestinal: Not Present- Abdominal Pain, Constipation, Nausea and Vomiting (resolving). Female Genitourinary: Not Present- Frequency, Painful Urination, Pelvic Pain, Vaginal  Bleeding, Vaginal Discharge, Contractions, regular, Fetal Movements Decreased, Urinary Complaints and Vaginal Fluid. Musculoskeletal: Not Present- Back Pain and Leg Cramps. Neurological: Not Present- Dizziness. Psychiatric: Not Present- Depression.     Objective:   Blood pressure 118/78, pulse 73, height 4\' 11"  (1.499 m), weight 184 lb 14.4 oz (83.9 kg), last menstrual period 10/06/2020.  Body mass index is 37.35 kg/m.  General Appearance:    Alert, cooperative, no distress, appears stated age, moderate obesity  Head:    Normocephalic, without obvious abnormality, atraumatic  Eyes:    PERRL, conjunctiva/corneas  clear, EOM's intact, both eyes  Ears:    Normal external ear canals, both ears  Nose:   Nares normal, septum midline, mucosa normal, no drainage or sinus tenderness  Throat:   Lips, mucosa, and tongue normal; teeth and gums normal  Neck:   Supple, symmetrical, trachea midline, no adenopathy; thyroid: no enlargement/tenderness/nodules; no carotid bruit or JVD  Back:     Symmetric, no curvature, ROM normal, no CVA tenderness  Lungs:     Clear to auscultation bilaterally, respirations unlabored  Chest Wall:    No tenderness or deformity   Heart:    Regular rate and rhythm, S1 and S2 normal, no murmur, rub or gallop  Breast Exam:    No tenderness, masses, or nipple abnormality  Abdomen:     Soft, non-tender, bowel sounds active all four quadrants, no masses, no organomegaly.  FHT 160 bpm.  Genitalia:    Pelvic:external genitalia normal, vagina without lesions, discharge, or tenderness, rectovaginal septum  normal. Cervix normal in appearance, no cervical motion tenderness, no adnexal masses or tenderness.  Pregnancy positive findings: uterine enlargement: 12 wk size, nontender.   Rectal:    Normal external sphincter.  No hemorrhoids appreciated. Internal exam not done.   Extremities:   Extremities normal, atraumatic, no cyanosis or edema  Pulses:   2+ and symmetric all extremities  Skin:   Skin color, texture, turgor normal, no rashes or lesions. Well healed (keloid) supraumbilical midline abdominal incision.   Lymph nodes:   Cervical, supraclavicular, and axillary nodes normal  Neurologic:   CNII-XII intact, normal strength, sensation and reflexes throughout      Assessment:   1. Encounter for supervision of normal first pregnancy in first trimester   2. [redacted] weeks gestation of pregnancy   3. Pap smear for cervical cancer screening   4. History of infertility   5. PCO (polycystic ovaries)   6. Crohn's disease of colon with complication (HCC)   7. History of partial colectomy   8.  Subchorionic hematoma in first trimester, single or unspecified fetus   9. Obesity (BMI 30-39.9)   10. Rh negative state in antepartum period     Plan:   1. Supervision of normal pregnancy  - Initial labs reviewed.  - Prenatal vitamins encouraged. - Problem list reviewed and updated. - New OB counseling:  The patient has been given an overview regarding routine prenatal care.  - Prenatal testing, optional genetic testing, and ultrasound use in pregnancy were reviewed.  Cell-free DNA testing desired, ordered Panorama genetic screening. Will perform Horizon carrier screening when new kits arrive.  - Benefits of Breast Feeding were discussed. The patient is encouraged to consider nursing her baby post partum.   2. Cervical cancer screening - Pap smear performed today for cervical cancer screening. Continue routine screens.   3. PCO (polycystic ovaries) with h/o primary infertility - Conceived current pregnancy with ovulation induction medication Clomid.   4. Crohn's  disease of colon with complication [facility]) - History of partial colectomy in 2012 (peformed at [facility], unable to view records from this time in Care Everywhere). No further issues since that time.  Due to nature of surgery, will schedule patient for consultation with General Surgery during pregnancy to establish care, in case operative mode of delivery is indicated in the future.  - Currently receiving q 2 month Remicade infusions, has regular f/u with GI (also through [facility]). This is an FDA Pregnancy Category B medication, no proven risks in humans and not associated with adverse pregnancy outcomes, but does cross the placenta.  However, on literature review, it is recommended of a 6 month waiting period following birth before any live vaccine is administered. To inform Pediatricians of information.   5. Subchorionic hematoma in first trimester, single or unspecified fetus - Answered questions regarding findings of subchorionic  hemorrhage on ultrasound. Given reassurance that this usually resolves by the second trimester. Patient has not had any bleeding during the pregnancy.   6. Obesity (BMI 30-39.9) - Recommendations regarding diet, weight gain, and exercise in pregnancy were given. Advised to gain no more than 15-20 lbs in current pregnancy.   7. Rh negative state in antepartum period - Patient has O negative blood type. Will need Rhogam at [redacted] weeks gestation. To report any episodes of bleeding prior to this time for earlier administration.    Follow up in 4 weeks.  50% of 30 min visit spent on counseling and coordination of care.  The patient has Medicaid.  [facility] Medicaid Risk Screening Form completed today    Hildred Laser, MD [facility]

## 2020-12-29 NOTE — Progress Notes (Signed)
NOB-PE present for initial prenatal care. Pt stated that she was doing well no problems.

## 2020-12-30 LAB — CBC
Hematocrit: 39.2 % (ref 34.0–46.6)
Hemoglobin: 12.8 g/dL (ref 11.1–15.9)
MCH: 26.8 pg (ref 26.6–33.0)
MCHC: 32.7 g/dL (ref 31.5–35.7)
MCV: 82 fL (ref 79–97)
Platelets: 318 10*3/uL (ref 150–450)
RBC: 4.78 x10E6/uL (ref 3.77–5.28)
RDW: 13.7 % (ref 11.7–15.4)
WBC: 10.7 10*3/uL (ref 3.4–10.8)

## 2020-12-30 LAB — CYTOLOGY - PAP: Diagnosis: NEGATIVE

## 2021-01-06 ENCOUNTER — Encounter: Payer: Self-pay | Admitting: Surgery

## 2021-01-06 ENCOUNTER — Other Ambulatory Visit: Payer: Self-pay

## 2021-01-06 ENCOUNTER — Ambulatory Visit: Payer: BC Managed Care – PPO | Admitting: Surgery

## 2021-01-06 VITALS — BP 104/69 | HR 83 | Temp 98.5°F | Ht 59.0 in | Wt 179.0 lb

## 2021-01-06 DIAGNOSIS — O0001 Abdominal pregnancy with intrauterine pregnancy: Secondary | ICD-10-CM | POA: Insufficient documentation

## 2021-01-06 DIAGNOSIS — Z933 Colostomy status: Secondary | ICD-10-CM

## 2021-01-06 DIAGNOSIS — K50119 Crohn's disease of large intestine with unspecified complications: Secondary | ICD-10-CM

## 2021-01-06 NOTE — Progress Notes (Signed)
Patient ID: MARCELYN RUPPE, female   DOB: [birth year], 25 y.o.   MRN: [id number]  Chief Complaint: History of intestinal surgery with ostomy, secondary to Crohn's.  History of Present Illness IRISHA GRANDMAISON is a 25 y.o. female with an intrauterine pregnancy entering the second trimester.  She presents with more recent her fianc.  Dr. Valentino Saxon referred her, anticipating a possibility of a C-section.  Anticipating potential scarring/bowel involvement with approach to the uterus.  At present she appears to have her Crohn's disease well controlled with Remicade, her gastroenterologist is Dr. Jacqulyn Bath in [place].  She had a colostomy in 2012 with an appendectomy, a colostomy takedown with partial colectomy in 2013.  Past Medical History Past Medical History:  Diagnosis Date  . Crohn disease [facility])       Past Surgical History:  Procedure Laterality Date  . APPENDECTOMY    . COLECTOMY    . COLOSTOMY    . COLOSTOMY REVERSAL      No Known Allergies  Current Outpatient Medications  Medication Sig Dispense Refill  . inFLIXimab (REMICADE) 100 MG injection Inject into the vein.    . Prenat-FeAsp-Meth-FA-DHA w/o A (PRENATE PIXIE) 10-0.6-0.4-200 MG CAPS Take 1 tablet by mouth daily. 30 capsule 11   No current facility-administered medications for this visit.    Family History Family History  Problem Relation Age of Onset  . Healthy Mother   . Asthma Father   . Diabetes Maternal Grandmother       Social History Social History   Tobacco Use  . Smoking status: Never Smoker  . Smokeless tobacco: Never Used  Vaping Use  . Vaping Use: Never used  Substance Use Topics  . Alcohol use: No  . Drug use: No        Review of Systems  Constitutional: Negative.   HENT: Negative.   Eyes: Negative.   Cardiovascular: Negative.   Gastrointestinal: Positive for heartburn, nausea and vomiting (Gestational).  Genitourinary: Negative.   Skin: Negative.   Neurological: Negative.    Psychiatric/Behavioral: Negative.       Physical Exam Blood pressure 104/69, pulse 83, temperature 98.5 F (36.9 C), height 4\' 11"  (1.499 m), weight 179 lb (81.2 kg), last menstrual period 10/06/2020, SpO2 98 %. Last Weight  Most recent update: 01/06/2021 11:22 AM   Weight  81.2 kg (179 lb)            CONSTITUTIONAL: Well developed, and nourished, appropriately responsive and aware without distress.   EYES: Sclera non-icteric.   EARS, NOSE, MOUTH AND THROAT: Mask worn.   Hearing is intact to voice.  NECK: Trachea is midline, and there is no jugular venous distension.  LYMPH NODES:  Lymph nodes in the neck are not enlarged. RESPIRATORY:  Lungs are clear, and breath sounds are equal bilaterally. Normal respiratory effort without pathologic use of accessory muscles. CARDIOVASCULAR: Heart is regular in rate and rhythm. GI: The abdomen is gravid, with midline scar from xiphoid to suprapubic area.  There is a right sided ostomy site, no appreciable masses or fascial defects present.  Patient reports never having any particular incisional issues prior to this pregnancy or since her surgery.  Soft, nontender, and nondistended. There were no palpable masses. I did not appreciate hepatosplenomegaly. There were normal bowel sounds. MUSCULOSKELETAL:  Symmetrical muscle tone appreciated in all four extremities.    SKIN: Skin turgor is normal. No pathologic skin lesions appreciated.  NEUROLOGIC:  Motor and sensation appear grossly normal.  Cranial nerves are grossly  without defect. PSYCH:  Alert and oriented to person, place and time. Affect is appropriate for situation.  Data Reviewed I have personally reviewed what is currently available of the patient's imaging, recent labs and medical records.   Labs:  CBC Latest Ref Rng & Units 12/29/2020 03/04/2018  WBC 3.4 - 10.8 x10E3/uL 10.7 9.5  Hemoglobin 11.1 - 15.9 g/dL 31.5 10.2(L)  Hematocrit 34.0 - 46.6 % 39.2 32.0(L)  Platelets 150 - 450 x10E3/uL  318 367   CMP Latest Ref Rng & Units 03/04/2018  Glucose 65 - 99 mg/dL 400(Q)  BUN 6 - 20 mg/dL 13  Creatinine 6.76 - 1.95 mg/dL 0.93  Sodium 267 - 124 mmol/L 136  Potassium 3.5 - 5.1 mmol/L 3.8  Chloride 101 - 111 mmol/L 108  CO2 22 - 32 mmol/L 23  Calcium 8.9 - 10.3 mg/dL 5.8(K)      Imaging:  Within last 24 hrs: No results found.  Assessment    Intrauterine pregnancy, currently entering second trimester. Surgical history as noted, Crohn's disease currently treated with Remicade. Patient Active Problem List   Diagnosis Date Noted  . Subchorionic hematoma in first trimester 12/29/2020  . History of infertility 12/29/2020  . Obesity (BMI 30-39.9) 12/29/2020  . PCO (polycystic ovaries) 12/29/2020  . Iron deficiency 03/13/2018  . 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  . Crohn's colitis (HCC) 09/12/2017  . Other headache syndrome 03/26/2017  . Asthma 09/30/2014  . Keloid scar 09/19/2011  . 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 Peritoneal abscess (HCC) 03/13/2010    Plan    Discussed anticipated potential of C-section, timing of the same and the challenges there with.  We discussed the anticipated possibility of scarring that may potentially complicate and require a general surgeon to assist with dissection/mobilization of bowel or potential bowel repair.  If I myself I am not immediately available, I understand one of my general surgical associates will be gladly available.  Face-to-face time spent with the patient and accompanying care providers(if present) was 30 minutes, with more than 50% of the time spent counseling, educating, and coordinating care of the patient.      Campbell Lerner M.D., FACS 01/06/2021, 11:35 AM

## 2021-01-06 NOTE — Patient Instructions (Signed)
   Follow-up with our office as needed.  Please call and ask to speak with a nurse if you develop questions or concerns.  

## 2021-01-08 DIAGNOSIS — K50919 Crohn's disease, unspecified, with unspecified complications: Secondary | ICD-10-CM | POA: Diagnosis not present

## 2021-01-13 ENCOUNTER — Other Ambulatory Visit: Payer: Self-pay

## 2021-01-13 MED ORDER — ONDANSETRON HCL 4 MG PO TABS: 4.0000 mg | ORAL_TABLET | Freq: Three times a day (TID) | ORAL | 0 refills | Status: DC | PRN

## 2021-01-13 MED ORDER — DOXYLAMINE-PYRIDOXINE 10-10 MG PO TBEC: DELAYED_RELEASE_TABLET | ORAL | 0 refills | Status: DC

## 2021-01-18 DIAGNOSIS — Z6836 Body mass index (BMI) 36.0-36.9, adult: Secondary | ICD-10-CM | POA: Diagnosis not present

## 2021-01-18 DIAGNOSIS — K50918 Crohn's disease, unspecified, with other complication: Secondary | ICD-10-CM | POA: Diagnosis not present

## 2021-01-26 ENCOUNTER — Ambulatory Visit (INDEPENDENT_AMBULATORY_CARE_PROVIDER_SITE_OTHER): Payer: BC Managed Care – PPO | Admitting: Obstetrics and Gynecology

## 2021-01-26 ENCOUNTER — Other Ambulatory Visit: Payer: Self-pay

## 2021-01-26 VITALS — BP 109/77 | HR 99 | Wt 181.8 lb

## 2021-01-26 DIAGNOSIS — Z3A16 16 weeks gestation of pregnancy: Secondary | ICD-10-CM | POA: Diagnosis not present

## 2021-01-26 DIAGNOSIS — Z3402 Encounter for supervision of normal first pregnancy, second trimester: Secondary | ICD-10-CM

## 2021-01-26 LAB — POCT URINALYSIS DIPSTICK OB
Bilirubin, UA: NEGATIVE
Blood, UA: NEGATIVE
Glucose, UA: NEGATIVE
Ketones, UA: NEGATIVE
Leukocytes, UA: NEGATIVE
Nitrite, UA: NEGATIVE
Spec Grav, UA: 1.02 (ref 1.010–1.025)
Urobilinogen, UA: 0.2 E.U./dL
pH, UA: 6 (ref 5.0–8.0)

## 2021-01-26 NOTE — Progress Notes (Signed)
ROB: doing well, no concerns today.

## 2021-01-26 NOTE — Progress Notes (Signed)
ROB: Patient doing well.  Nausea and vomiting has become much better.  aFP today.  Ultrasound for 20 weeks scheduled.

## 2021-01-28 LAB — AFP, SERUM, OPEN SPINA BIFIDA
AFP MoM: 2.16
AFP Value: 73.6 ng/mL
Gest. Age on Collection Date: 16 weeks
Maternal Age At EDD: 27.7 yr
OSBR Risk 1 IN: 1139
Test Results:: NEGATIVE
Weight: 181 [lb_av]

## 2021-02-05 DIAGNOSIS — K50919 Crohn's disease, unspecified, with unspecified complications: Secondary | ICD-10-CM | POA: Diagnosis not present

## 2021-02-15 DIAGNOSIS — K50918 Crohn's disease, unspecified, with other complication: Secondary | ICD-10-CM | POA: Diagnosis not present

## 2021-02-22 ENCOUNTER — Other Ambulatory Visit: Payer: Self-pay

## 2021-02-22 MED ORDER — ONDANSETRON HCL 4 MG PO TABS: 4.0000 mg | ORAL_TABLET | Freq: Three times a day (TID) | ORAL | 0 refills | Status: DC | PRN

## 2021-02-24 ENCOUNTER — Ambulatory Visit (INDEPENDENT_AMBULATORY_CARE_PROVIDER_SITE_OTHER): Payer: BC Managed Care – PPO | Admitting: Obstetrics and Gynecology

## 2021-02-24 ENCOUNTER — Encounter: Payer: Self-pay | Admitting: Obstetrics and Gynecology

## 2021-02-24 ENCOUNTER — Ambulatory Visit
Admission: RE | Admit: 2021-02-24 | Discharge: 2021-02-24 | Disposition: A | Discharge status: Home / Self Care | Payer: BC Managed Care – PPO | Source: Ambulatory Visit | Attending: Obstetrics and Gynecology | Admitting: Obstetrics and Gynecology

## 2021-02-24 ENCOUNTER — Other Ambulatory Visit: Payer: Self-pay

## 2021-02-24 VITALS — BP 124/84 | HR 103 | Wt 183.8 lb

## 2021-02-24 DIAGNOSIS — Z3492 Encounter for supervision of normal pregnancy, unspecified, second trimester: Secondary | ICD-10-CM | POA: Diagnosis not present

## 2021-02-24 DIAGNOSIS — Z3A20 20 weeks gestation of pregnancy: Secondary | ICD-10-CM

## 2021-02-24 DIAGNOSIS — E669 Obesity, unspecified: Secondary | ICD-10-CM

## 2021-02-24 DIAGNOSIS — Z3402 Encounter for supervision of normal first pregnancy, second trimester: Secondary | ICD-10-CM

## 2021-02-24 DIAGNOSIS — K50119 Crohn's disease of large intestine with unspecified complications: Secondary | ICD-10-CM

## 2021-02-24 LAB — POCT URINALYSIS DIPSTICK OB
Bilirubin, UA: NEGATIVE
Blood, UA: NEGATIVE
Glucose, UA: NEGATIVE
Ketones, UA: NEGATIVE
Leukocytes, UA: NEGATIVE
Nitrite, UA: NEGATIVE
POC,PROTEIN,UA: NEGATIVE
Spec Grav, UA: 1.01 (ref 1.010–1.025)
Urobilinogen, UA: 0.2 E.U./dL
pH, UA: 7.5 (ref 5.0–8.0)

## 2021-02-24 NOTE — Progress Notes (Signed)
OB-Pt present for routine prenatal care. Pt stated having pelvic pain.

## 2021-02-24 NOTE — Patient Instructions (Signed)

## 2021-02-24 NOTE — Progress Notes (Signed)
ROB: Doing well, no major complaints.  Did have some issues with stomach pain, discussed concerns with her GI provider due to Crohn's, had lab testing done which was normal. Notes it resolved a few days later. Thinks it may have been stress/anxiety related.  Discussed managing stressors. Scheduled for anatomy scan today. RTC in 4 weeks.   The following were addressed during this visit:  Breastfeeding Education - The importance of exclusive breastfeeding    Comments: Provides antibodies, Lower risk of breast and ovarian cancers, and type-2 diabetes,Helps your body recover, Reduced chance of SIDS.   - Effective positioning and attachment    Comments: Helps my baby to get enough breast milk, helps to produce an adequate milk supply, and helps prevent nipple pain and damage   - Exclusive breastfeeding for the first 6 months    Comments: Builds a healthy milk supply and keeps it up, protects baby from sickness and disease, and breastmilk has everything your baby needs for the first 6 months.

## 2021-03-05 DIAGNOSIS — K50919 Crohn's disease, unspecified, with unspecified complications: Secondary | ICD-10-CM | POA: Diagnosis not present

## 2021-03-08 DIAGNOSIS — R609 Edema, unspecified: Secondary | ICD-10-CM | POA: Diagnosis not present

## 2021-03-24 ENCOUNTER — Other Ambulatory Visit: Payer: Self-pay

## 2021-03-24 ENCOUNTER — Ambulatory Visit (INDEPENDENT_AMBULATORY_CARE_PROVIDER_SITE_OTHER): Payer: BC Managed Care – PPO | Admitting: Obstetrics and Gynecology

## 2021-03-24 VITALS — BP 118/76 | HR 108 | Wt 186.5 lb

## 2021-03-24 DIAGNOSIS — Z3A24 24 weeks gestation of pregnancy: Secondary | ICD-10-CM

## 2021-03-24 DIAGNOSIS — Z3402 Encounter for supervision of normal first pregnancy, second trimester: Secondary | ICD-10-CM

## 2021-03-24 LAB — POCT URINALYSIS DIPSTICK
Bilirubin, UA: NEGATIVE
Blood, UA: NEGATIVE
Glucose, UA: NEGATIVE
Ketones, UA: NEGATIVE
Leukocytes, UA: NEGATIVE
Nitrite, UA: NEGATIVE
Protein, UA: NEGATIVE
Spec Grav, UA: 1.01 (ref 1.010–1.025)
Urobilinogen, UA: 0.2 E.U./dL
pH, UA: 7 (ref 5.0–8.0)

## 2021-03-24 NOTE — Progress Notes (Signed)
ROB: Occasional left thigh numbness-not disabling.  Feels daily fetal movement.  Ultrasound results reviewed.  1 hour GCT next visit.

## 2021-04-02 DIAGNOSIS — K50919 Crohn's disease, unspecified, with unspecified complications: Secondary | ICD-10-CM | POA: Diagnosis not present

## 2021-04-02 DIAGNOSIS — K51919 Ulcerative colitis, unspecified with unspecified complications: Secondary | ICD-10-CM | POA: Diagnosis not present

## 2021-04-19 IMAGING — US US OB COMP LESS 14 WK
1 series · 14 of 28 positions shown · non-contrast
Comparison: None.

CLINICAL DATA: Dating, viability

EXAM:
OBSTETRIC <14 WK ULTRASOUND
TECHNIQUE: Transabdominal ultrasound was performed for evaluation of the
gestation as well as the maternal uterus and adnexal regions.

[Series 1: us ob comp less 14 wk · 0.14mm/px · 14 of 50 slices shown]
[im 2/50]
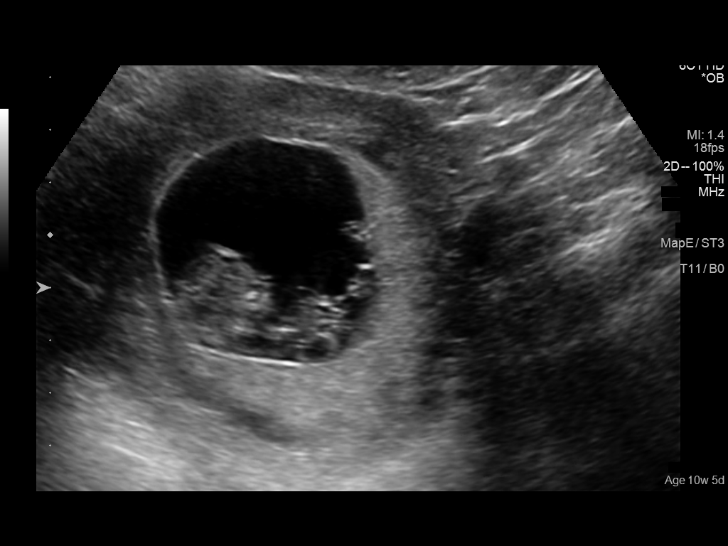
[im 6/50]
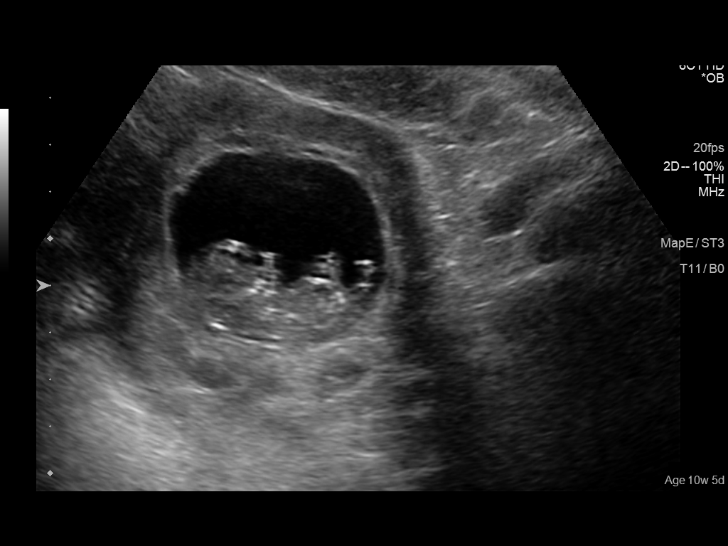
[im 10/50]
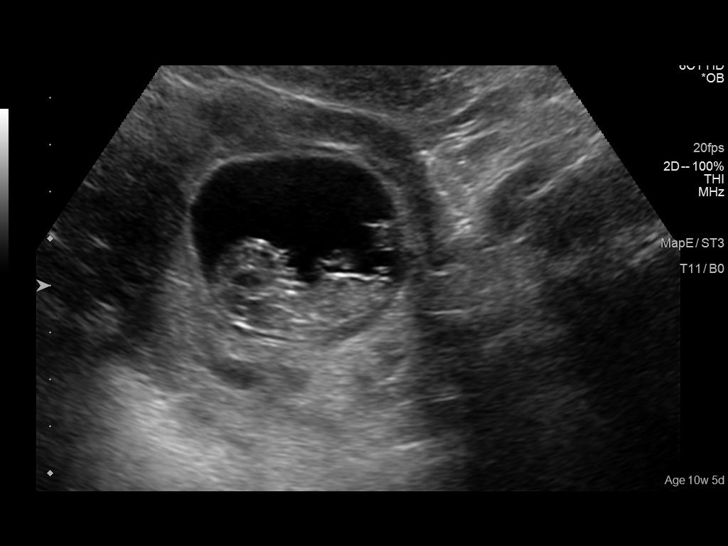
[im 13/50]
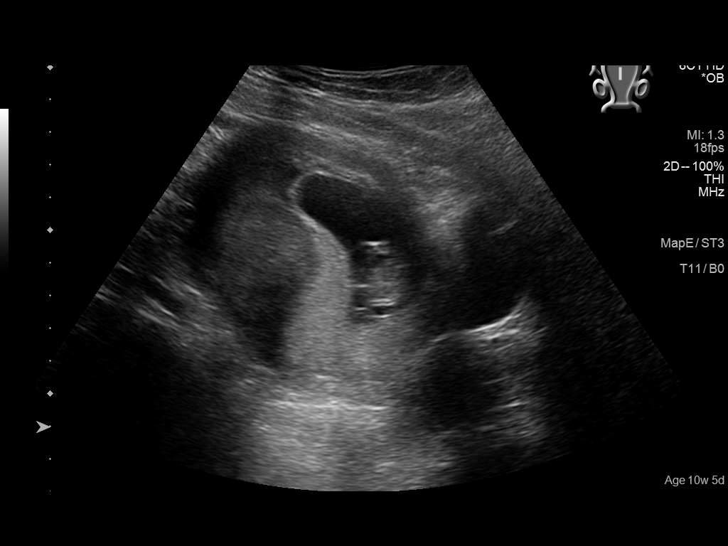
[im 17/50]
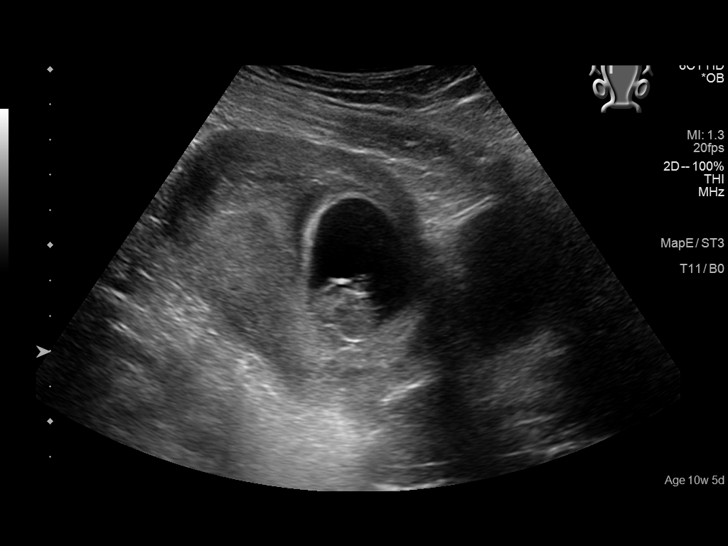
[im 20/50]
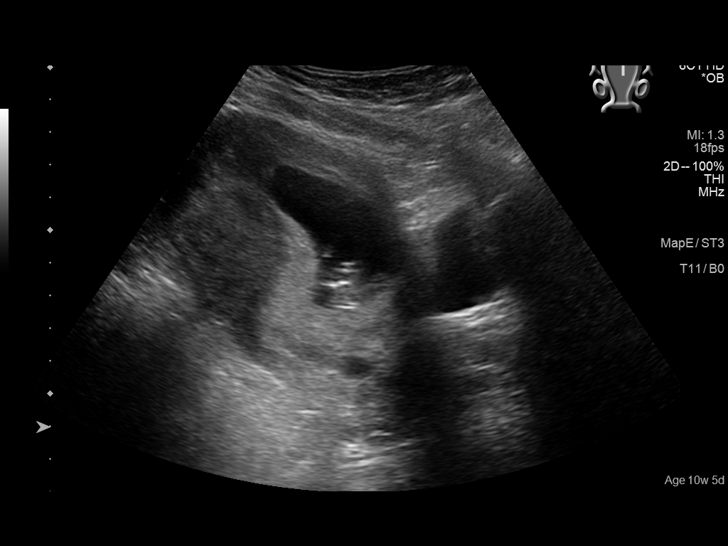
[im 24/50]
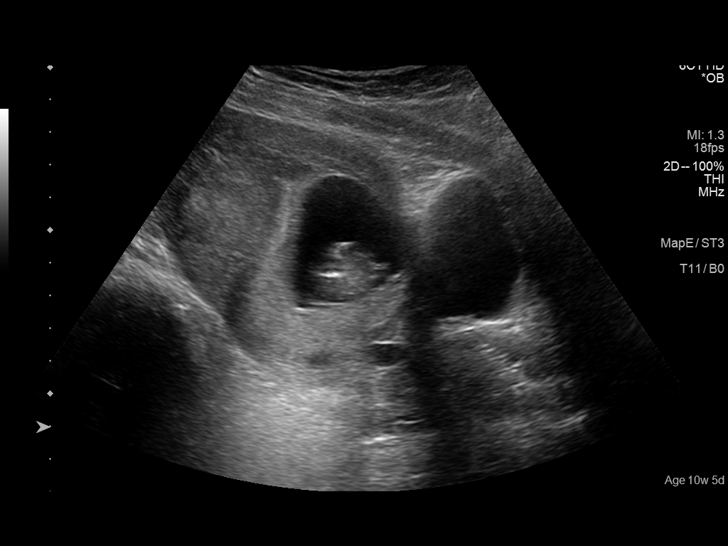
[im 28/50]
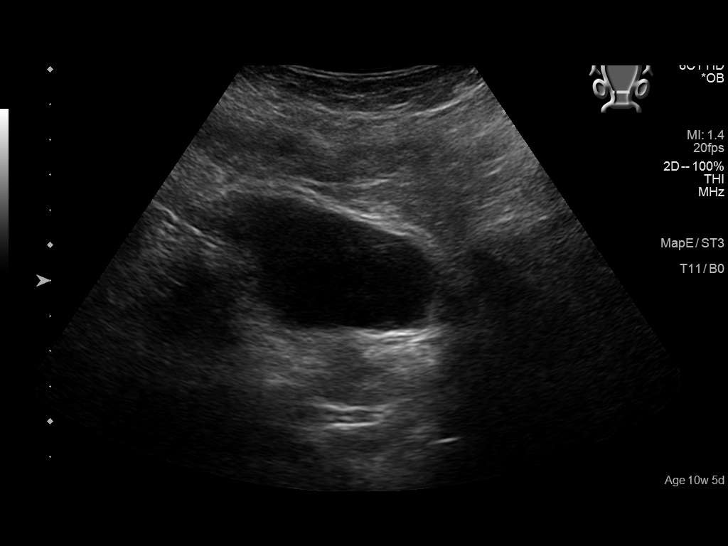
[im 31/50]
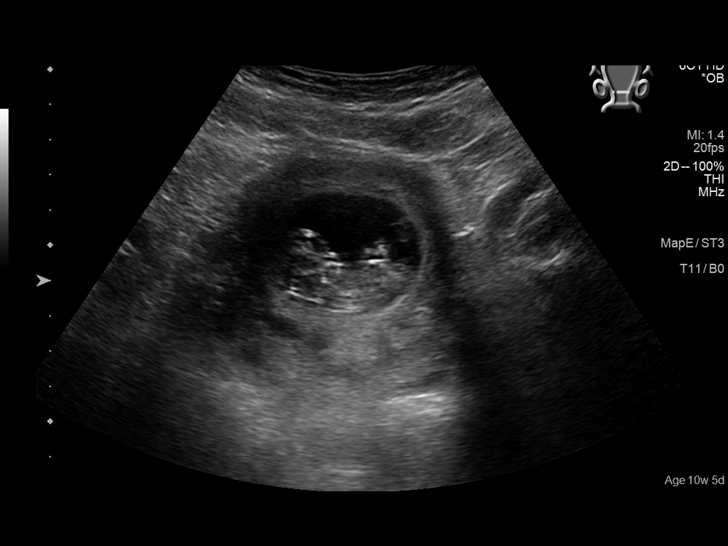
[im 35/50]
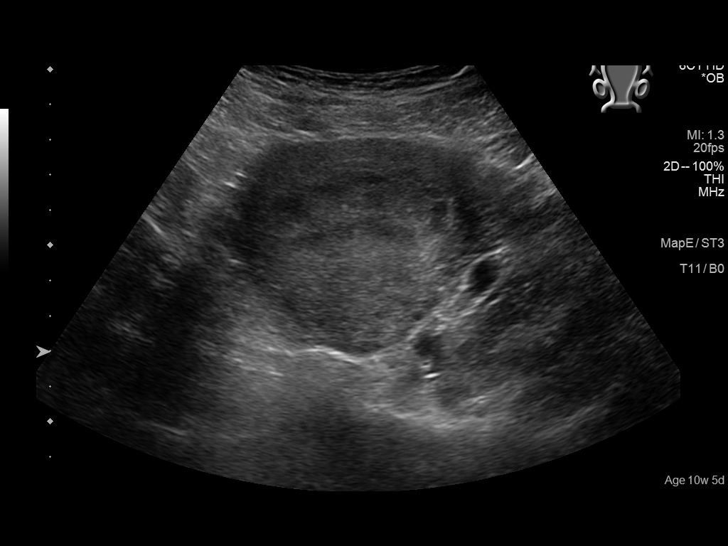
[im 39/50]
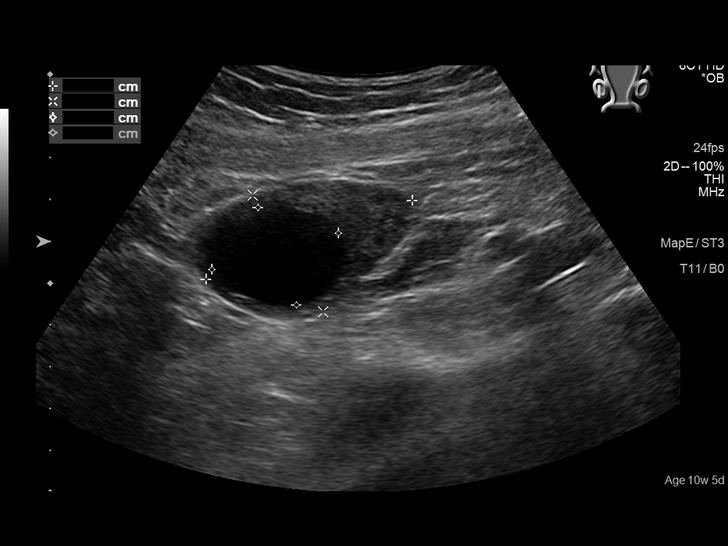
[im 42/50]
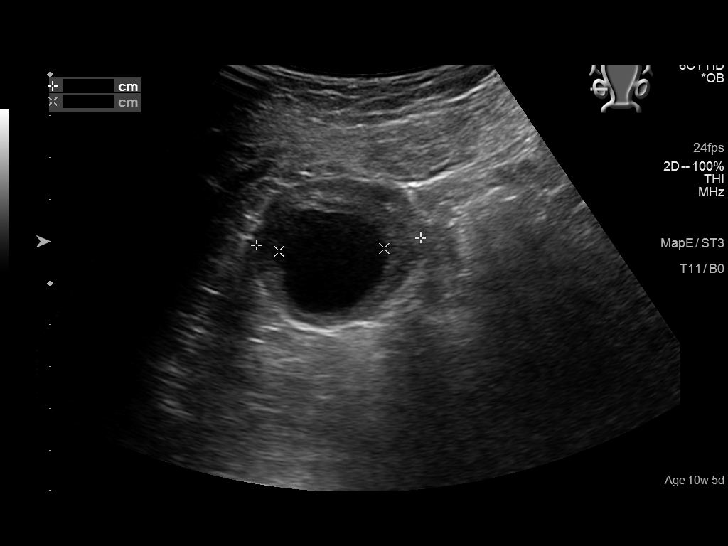
[im 46/50]
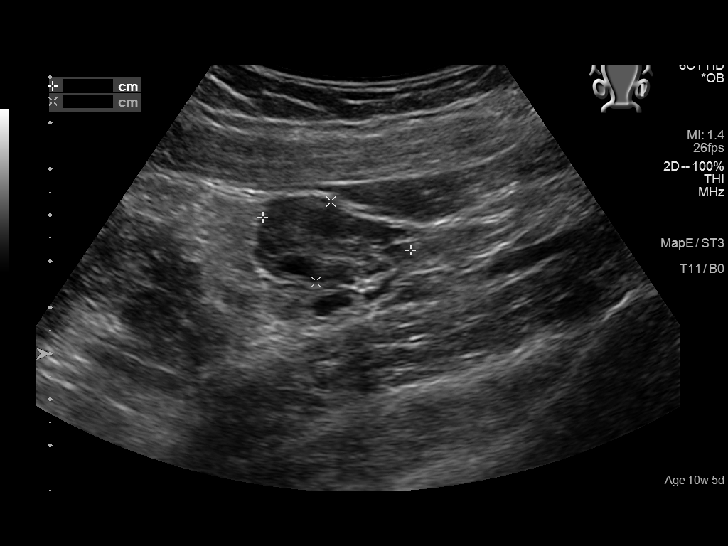
[im 50/50]
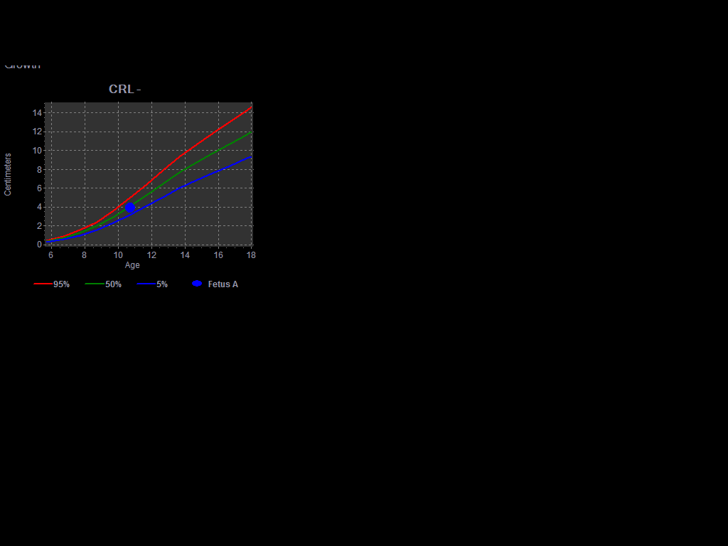

[14 of 28 positions shown; findings below may reference images not displayed]

FINDINGS: Intrauterine gestational sac: Single

Yolk sac:  Visualized

Embryo:  Visualized

Cardiac Activity: Visualized

Heart Rate: 173 bpm

MSD:    mm    w     d

CRL:   38.9 mm   10 w 5 d                  US EDC: 07/23/2021

Subchorionic hemorrhage:  Small subchorionic hemorrhage

Maternal uterus/adnexae: No adnexal mass or free fluid.
IMPRESSION: Ten week 5 day intrauterine pregnancy. Fetal heart rate 173 beats
per minute. Small subchorionic hemorrhage.

## 2021-04-20 ENCOUNTER — Encounter: Payer: Self-pay | Admitting: Obstetrics and Gynecology

## 2021-04-20 ENCOUNTER — Other Ambulatory Visit: Payer: BC Managed Care – PPO

## 2021-04-20 ENCOUNTER — Other Ambulatory Visit: Payer: Self-pay

## 2021-04-20 ENCOUNTER — Ambulatory Visit (INDEPENDENT_AMBULATORY_CARE_PROVIDER_SITE_OTHER): Payer: BC Managed Care – PPO | Admitting: Obstetrics and Gynecology

## 2021-04-20 VITALS — BP 123/85 | HR 101 | Wt 189.4 lb

## 2021-04-20 DIAGNOSIS — Z23 Encounter for immunization: Secondary | ICD-10-CM | POA: Diagnosis not present

## 2021-04-20 DIAGNOSIS — Z3403 Encounter for supervision of normal first pregnancy, third trimester: Secondary | ICD-10-CM | POA: Diagnosis not present

## 2021-04-20 DIAGNOSIS — Z3A24 24 weeks gestation of pregnancy: Secondary | ICD-10-CM | POA: Diagnosis not present

## 2021-04-20 DIAGNOSIS — Z3A28 28 weeks gestation of pregnancy: Secondary | ICD-10-CM

## 2021-04-20 MED ORDER — TETANUS-DIPHTH-ACELL PERTUSSIS 5-2.5-18.5 LF-MCG/0.5 IM SUSY
0.5000 mL | PREFILLED_SYRINGE | Freq: Once | INTRAMUSCULAR | Status: AC
  Administered 2021-04-20: 0.5 mL via INTRAMUSCULAR

## 2021-04-20 NOTE — Progress Notes (Signed)
ROB: Notes concern about recurrence of yeast infections (has had 2 in the past 2 months). Discussed natural remedies to help (probiotics, yogurt, tea-tree oil). For 28 week labs today.  Desires to breastfeed, given handout. Desires unsure method for contraception, given handouts.  Also given info on doula program. For Tdap today, signed blood consent. RTC in 2 weeks.

## 2021-04-20 NOTE — Progress Notes (Signed)
OB-Pt present for routine prenatal care and 28 week labs. BTC and Tdap completed. Pt stated that she was doing good.

## 2021-04-20 NOTE — Patient Instructions (Signed)
Third Trimester of Pregnancy  The third trimester of pregnancy is from week 28 through week 59. This is also called months 7 through 9. This trimester is when your unborn baby (fetus) is growing very fast. At the end of the ninth month, the unborn baby is about20 inches long. It weighs about 6-10 pounds. Body changes during your third trimester Your body continues to go through many changes during this time. The changesvary and generally return to normal after the baby is born. Physical changes Your weight will continue to increase. You may gain 25-35 pounds (11-16 kg) by the end of the pregnancy. If you are underweight, you may gain 28-40 lb (about 13-18 kg). If you are overweight, you may gain 15-25 lb (about 7-11 kg). You may start to get stretch marks on your hips, belly (abdomen), and breasts. Your breasts will continue to grow and may hurt. A yellow fluid (colostrum) may leak from your breasts. This is the first milk you are making for your baby. You may have changes in your hair. Your belly button may stick out. You may have more swelling in your hands, face, or ankles. Health changes You may have heartburn. You may have trouble pooping (constipation). You may get hemorrhoids. These are swollen veins in the butt that can itch or get painful. You may have swollen veins (varicose veins) in your legs. You may have more body aches in the pelvis, back, or thighs. You may have more tingling or numbness in your hands, arms, and legs. The skin on your belly may also feel numb. You may feel short of breath as your womb (uterus) gets bigger. Other changes You may pee (urinate) more often. You may have more problems sleeping. You may notice the unborn baby "dropping," or moving lower in your belly. You may have more discharge coming from your vagina. Your joints may feel loose, and you may have pain around your pelvic bone. Follow these instructions at home: Medicines Take over-the-counter  and prescription medicines only as told by your doctor. Some medicines are not safe during pregnancy. Take a prenatal vitamin that contains at least 600 micrograms (mcg) of folic acid. Eating and drinking Eat healthy meals that include: Fresh fruits and vegetables. Whole grains. Good sources of protein, such as meat, eggs, or tofu. Low-fat dairy products. Avoid raw meat and unpasteurized juice, milk, and cheese. These carry germs that can harm you and your baby. Eat 4 or 5 small meals rather than 3 large meals a day. You may need to take these actions to prevent or treat trouble pooping: Drink enough fluids to keep your pee (urine) pale yellow. Eat foods that are high in fiber. These include beans, whole grains, and fresh fruits and vegetables. Limit foods that are high in fat and sugar. These include fried or sweet foods. Activity Exercise only as told by your doctor. Stop exercising if you start to have cramps in your womb. Avoid heavy lifting. Do not exercise if it is too hot or too humid, or if you are in a place of great height (high altitude). If you choose to, you may have sex unless your doctor tells you not to. Relieving pain and discomfort Take breaks often, and rest with your legs raised (elevated) if you have leg cramps or low back pain. Take warm water baths (sitz baths) to soothe pain or discomfort caused by hemorrhoids. Use hemorrhoid cream if your doctor approves. Wear a good support bra if your breasts are tender. If  you develop bulging, swollen veins in your legs: Wear support hose as told by your doctor. Raise your feet for 15 minutes, 3-4 times a day. Limit salt in your food. Safety Talk to your doctor before traveling far distances. Do not use hot tubs, steam rooms, or saunas. Wear your seat belt at all times when you are in a car. Talk with your doctor if someone is hurting you or yelling at you a lot. Preparing for your baby's arrival To prepare for the arrival  of your baby: Take prenatal classes. Visit the hospital and tour the maternity area. Buy a rear-facing car seat. Learn how to install it in your car. Prepare the baby's room. Take out all pillows and stuffed animals from the baby's crib. General instructions Avoid cat litter boxes and soil used by cats. These carry germs that can cause harm to the baby and can cause a loss of your baby by miscarriage or stillbirth. Do not douche or use tampons. Do not use scented sanitary pads. Do not smoke or use any products that contain nicotine or tobacco. If you need help quitting, ask your doctor. Do not drink alcohol. Do not use herbal medicines, illegal drugs, or medicines that were not approved by your doctor. Chemicals in these products can affect your baby. Keep all follow-up visits. This is important. Where to find more information American Pregnancy Association: americanpregnancy.org [employer]: www.acog.org Office on Women's Health: KeywordPortfolios.com.br Contact a doctor if: You have a fever. You have mild cramps or pressure in your lower belly. You have a nagging pain in your belly area. You vomit, or you have watery poop (diarrhea). You have bad-smelling fluid coming from your vagina. You have pain when you pee, or your pee smells bad. You have a headache that does not go away when you take medicine. You have changes in how you see, or you see spots in front of your eyes. Get help right away if: Your water breaks. You have regular contractions that are less than 5 minutes apart. You are spotting or bleeding from your vagina. You have very bad belly cramps or pain. You have trouble breathing. You have chest pain. You faint. You have not felt the baby move for the amount of time told by your doctor. You have new or increased pain, swelling, or redness in an arm or leg. Summary The third trimester is from week 28 through week 40 (months 7  through 9). This is the time when your unborn baby is growing very fast. During this time, your discomfort may increase as you gain weight and as your baby grows. Get ready for your baby to arrive by taking prenatal classes, buying a rear-facing car seat, and preparing the baby's room. Get help right away if you are bleeding from your vagina, you have chest pain and trouble breathing, or you have not felt the baby move for the amount of time told by your doctor. This information is not intended to replace advice given to you by your health care provider. Make sure you discuss any questions you have with your healthcare provider. Document Revised: 03/03/2020 Document Reviewed: 01/08/2020 Elsevier Patient Education  [place]. Tdap (Tetanus, Diphtheria, Pertussis) Vaccine: What You Need to Know 1. Why get vaccinated? Tdap vaccine can prevent tetanus, diphtheria, and pertussis. Diphtheria and pertussis spread from person to person. Tetanus enters the body through cuts or wounds. TETANUS (T) causes painful stiffening of the muscles. Tetanus can lead to  serious health problems, including being unable to open the mouth, having trouble swallowing and breathing, or death. DIPHTHERIA (D) can lead to difficulty breathing, heart failure, paralysis, or death. PERTUSSIS (aP), also known as "whooping cough," can cause uncontrollable, violent coughing that makes it hard to breathe, eat, or drink. Pertussis can be extremely serious especially in babies and young children, causing pneumonia, convulsions, brain damage, or death. In teens and adults, it can cause weight loss, loss of bladder control, passing out, and rib fractures from severe coughing. 2. Tdap vaccine Tdap is only for children 7 years and older, adolescents, and adults.  Adolescents should receive a single dose of Tdap, preferably at age 27 or 47 years. Pregnant people should get a dose of Tdap during every pregnancy, preferably during the  early part of the third trimester, to help protect the newborn from pertussis. Infants are most at risk for severe, life-threatening complications frompertussis. Adults who have never received Tdap should get a dose of Tdap. Also, adults should receive a booster dose of either Tdap or Td (a different vaccine that protects against tetanus and diphtheria but not pertussis) every 10 years, or after 5 years in the case of a severe or dirty wound or burn. Tdap may be given at the same time as other vaccines. 3. Talk with your health care provider Tell your vaccine provider if the person getting the vaccine: Has had an allergic reaction after a previous dose of any vaccine that protects against tetanus, diphtheria, or pertussis, or has any severe, life-threatening allergies Has had a coma, decreased level of consciousness, or prolonged seizures within 7 days after a previous dose of any pertussis vaccine (DTP, DTaP, or Tdap) Has seizures or another nervous system problem Has ever had Guillain-Barr Syndrome (also called "GBS") Has had severe pain or swelling after a previous dose of any vaccine that protects against tetanus or diphtheria In some cases, your health care provider may decide to postpone Tdapvaccination until a future visit. People with minor illnesses, such as a cold, may be vaccinated. People who are moderately or severely ill should usually wait until they recover beforegetting Tdap vaccine.  Your health care provider can give you more information. 4. Risks of a vaccine reaction Pain, redness, or swelling where the shot was given, mild fever, headache, feeling tired, and nausea, vomiting, diarrhea, or stomachache sometimes happen after Tdap vaccination. People sometimes faint after medical procedures, including vaccination. Tellyour provider if you feel dizzy or have vision changes or ringing in the ears.  As with any medicine, there is a very remote chance of a vaccine causing asevere  allergic reaction, other serious injury, or death. 5. What if there is a serious problem? An allergic reaction could occur after the vaccinated person leaves the clinic. If you see signs of a severe allergic reaction (hives, swelling of the face and throat, difficulty breathing, a fast heartbeat, dizziness, or weakness), call [id number] get the person to the nearest hospital. For other signs that concern you, call your health care provider.  Adverse reactions should be reported to the Vaccine Adverse Event Reporting System (VAERS). Your health care provider will usually file this report, or you can do it yourself. Visit the VAERS website at www.vaers.SamedayNews.es or call (215)648-6266. VAERS is only for reporting reactions, and VAERS staff members do not give medical advice. 6. The National Vaccine Injury Compensation Program The National Vaccine Injury Compensation Program (VICP) is a federal program that was created to compensate people who may have  been injured by certain vaccines. Claims regarding alleged injury or death due to vaccination have a time limit for filing, which may be as short as two years. Visit the VICP website at GoldCloset.com.ee or call 240-182-0513to learn about the program and about filing a claim. 7. How can I learn more? Ask your health care provider. Call your local or state health department. Visit the website of the Food and Drug Administration (FDA) for vaccine package inserts and additional information at TraderRating.uy. Contact the Centers for Disease Control and Prevention (CDC): Call 605-705-3122 (1-800-CDC-INFO) or Visit CDC's website at http://hunter.com/. Vaccine Information Statement Tdap (Tetanus, Diphtheria, Pertussis) Vaccine(05/14/2020) This information is not intended to replace advice given to you by your health care provider. Make sure you discuss any questions you have with your healthcare provider. Document  Revised: 06/09/2020 Document Reviewed: 06/09/2020 Elsevier Patient Education  2022 [place]. Breastfeeding and Breast Care It is normal to have some problems when you start to breastfeed your new baby. But there are things that you can do to take care of yourself and help prevent problems. This includes keeping your breasts healthy and making sure that your baby's mouth attaches (latches) properly to your nipple for feedings. Work with your doctor or breastfeeding specialist to find what works best foryou. How does self-care benefit me? If you keep your breasts healthy and you let your baby attach to your nipples in the right way, you will avoid these problems: Cracked or sore nipples. Breasts becoming overfilled with milk. Plugged milk ducts. Low milk supply. Breast swelling or infection. How does self-care benefit my baby? By preventing problems with your breasts, you will ensure that your baby willfeed well and will gain the right amount of weight. What actions can I take to care for myself during breastfeeding? Best ways to breastfeed Always make sure that your baby latches properly to breastfeed. Make sure that your baby is in a proper position. Try different breastfeeding positions to find one that works best for you and your baby. Breastfeed when you feel like you need to make your breasts less full or when your baby shows signs of hunger. This is called "breastfeeding on demand." Do not delay feedings. Try to relax when it is time to feed your baby. This helps your body release milk from your breast. To help increase milk flow, do these things before feeding: Remove a small amount of milk from your breast. Use a pump or squeeze with your hand. Apply warm, moist heat to your breast. Do this in the shower or use hand towels soaked with warm water. Massage your breasts. Do this when you are breastfeeding as well. Caring for your breasts     To help your breasts stay healthy and  keep them from getting too dry: Avoid using soap on your nipples. Let your nipples air-dry for 3-4 minutes after each feeding. Do not use things like a hair dryer to dry your breasts. This can make the skin dry and will cause irritation and pain. Use only cotton bra pads to soak up breast milk that leaks. Change the pads if they become soaked with milk. If you use bra pads that can be thrown away, change them often. Put some lanolin on your nipples after breastfeeding. Pure lanolin does not need to be washed off your nipple before you feed your baby again. Pure lanolin is not harmful to your baby. Rub some breast milk into your nipples: Use your hand to squeeze out a few  drops of breast milk. Gently massage the milk into your nipples. Let your nipples air-dry. Wear a supportive nursing bra. Avoid wearing: Tight clothing. Underwire bras or bras that put pressure on your breasts. Use ice to help relieve pain or swelling of your breasts: Put ice in a plastic bag. Place a towel between your skin and the bag. Leave the ice on for 20 minutes, 2-3 times a day. Follow these instructions at home: Drink enough fluid to keep your pee (urine) pale yellow. Get plenty of rest. Sleep when your baby sleeps. Talk to your doctor or breastfeeding specialist before taking any herbal supplements. Eat a balanced diet. This includes fruits, vegetables, whole grains, lean proteins, and dairy or dairy alternatives Contact a health care provider if: You have nipple pain. You have cracking or soreness in your nipples that lasts longer than 1 week. Your breasts are overfilled with milk, and this lasts longer than 48 hours. You have a fever. You have pus-like fluid coming from your nipple. You have redness, a rash, swelling, itching, or burning on your breast. Your baby does not gain weight. Your baby loses weight. Your baby is not feeding regularly or is very sleepy and lacks energy. Summary There are things  that you can do to take care of yourself and help prevent many common breastfeeding problems. Always make sure that your baby's mouth attaches (latches) to your nipple properly to breastfeed. Keep your nipples from getting too dry, drink plenty of fluid, and get plenty of rest. Feed on demand. Do not delay feedings. This information is not intended to replace advice given to you by your health care provider. Make sure you discuss any questions you have with your healthcare provider. Document Revised: 03/16/2020 Document Reviewed: 03/16/2020 Elsevier Patient Education  [place]. Common Medications Safe in Pregnancy  Acne:      Constipation:  Benzoyl Peroxide     Colace  Clindamycin      Dulcolax Suppository  Topica Erythromycin     Fibercon  Salicylic Acid      Metamucil         Miralax AVOID:        Senakot   Accutane    Cough:  Retin-A       Cough Drops  Tetracycline      Phenergan w/ Codeine if Rx  Minocycline      Robitussin (Plain & DM)  Antibiotics:     Crabs/Lice:  Ceclor       RID  Cephalosporins    AVOID:  E-Mycins      Kwell  Keflex  Macrobid/Macrodantin   Diarrhea:  Penicillin      Kao-Pectate  Zithromax      Imodium AD         PUSH FLUIDS AVOID:       Cipro     Fever:  Tetracycline      Tylenol (Regular or Extra  Minocycline       Strength)  Levaquin      Extra Strength-Do not          Exceed 8 tabs/24 hrs Caffeine:        <253m/day (equiv. To 1 cup of coffee or  approx. 3 12 oz sodas)         Gas: Cold/Hayfever:       Gas-X  Benadryl      Mylicon  Claritin       Phazyme  **Claritin-D        Chlor-Trimeton  Headaches:  Dimetapp      ASA-Free Excedrin  Drixoral-Non-Drowsy     Cold Compress  Mucinex (Guaifenasin)     Tylenol (Regular or Extra  Sudafed/Sudafed-12 Hour     Strength)  **Sudafed PE Pseudoephedrine   Tylenol Cold & Sinus     Vicks Vapor Rub  Zyrtec  **AVOID if Problems With Blood Pressure         Heartburn: Avoid lying  down for at least 1 hour after meals  Aciphex      Maalox     Rash:  Milk of Magnesia     Benadryl    Mylanta       1% Hydrocortisone Cream  Pepcid  Pepcid Complete   Sleep Aids:  Prevacid      Ambien   Prilosec       Benadryl  Rolaids       Chamomile Tea  Tums (Limit 4/day)     Unisom         Tylenol PM         Warm milk-add vanilla or  Hemorrhoids:       Sugar for taste  Anusol/Anusol H.C.  (RX: Analapram 2.5%)  Sugar Substitutes:  Hydrocortisone OTC     Ok in moderation  Preparation H      Tucks        Vaseline lotion applied to tissue with wiping    Herpes:     Throat:  Acyclovir      Oragel  Famvir  Valtrex     Vaccines:         Flu Shot Leg Cramps:       *Gardasil  Benadryl      Hepatitis A         Hepatitis B Nasal Spray:       Pneumovax  Saline Nasal Spray     Polio Booster         Tetanus Nausea:       Tuberculosis test or PPD  Vitamin B6 25 mg TID   AVOID:    Dramamine      *Gardasil  Emetrol       Live Poliovirus  Ginger Root 250 mg QID    MMR (measles, mumps &  High Complex Carbs @ Bedtime    rebella)  Sea Bands-Accupressure    Varicella (Chickenpox)  Unisom 1/2 tab TID     *No known complications           If received before Pain:         Known pregnancy;   Darvocet       Resume series after  Lortab        Delivery  Percocet    Yeast:   Tramadol      Femstat  Tylenol 3      Gyne-lotrimin  Ultram       Monistat  Vicodin           MISC:         All Sunscreens           Hair Coloring/highlights          Insect Repellant's          (Including DEET)         Mystic Tans

## 2021-04-21 LAB — CBC WITH DIFF/PLATELET
Basophils Absolute: 0.1 10*3/uL (ref 0.0–0.2)
Basos: 0 %
EOS (ABSOLUTE): 0.3 10*3/uL (ref 0.0–0.4)
Eos: 2 %
Hematocrit: 33.1 % — ABNORMAL LOW (ref 34.0–46.6)
Hemoglobin: 10.7 g/dL — ABNORMAL LOW (ref 11.1–15.9)
Immature Grans (Abs): 0 10*3/uL (ref 0.0–0.1)
Immature Granulocytes: 0 %
Lymphocytes Absolute: 2.7 10*3/uL (ref 0.7–3.1)
Lymphs: 23 %
MCH: 25.7 pg — ABNORMAL LOW (ref 26.6–33.0)
MCHC: 32.3 g/dL (ref 31.5–35.7)
MCV: 79 fL (ref 79–97)
Monocytes Absolute: 0.7 10*3/uL (ref 0.1–0.9)
Monocytes: 6 %
Neutrophils Absolute: 7.7 10*3/uL — ABNORMAL HIGH (ref 1.4–7.0)
Neutrophils: 69 %
Platelets: 324 10*3/uL (ref 150–450)
RBC: 4.17 x10E6/uL (ref 3.77–5.28)
RDW: 13 % (ref 11.7–15.4)
WBC: 11.4 10*3/uL — ABNORMAL HIGH (ref 3.4–10.8)

## 2021-04-21 LAB — RPR: RPR Ser Ql: NONREACTIVE

## 2021-04-21 LAB — GLUCOSE TOLERANCE, 1 HOUR: Glucose, 1Hr PP: 100 mg/dL (ref 65–199)

## 2021-04-30 DIAGNOSIS — K50919 Crohn's disease, unspecified, with unspecified complications: Secondary | ICD-10-CM | POA: Diagnosis not present

## 2021-05-04 ENCOUNTER — Encounter: Payer: BC Managed Care – PPO | Admitting: Obstetrics and Gynecology

## 2021-05-05 ENCOUNTER — Encounter: Payer: Self-pay | Admitting: Obstetrics and Gynecology

## 2021-05-05 ENCOUNTER — Ambulatory Visit (INDEPENDENT_AMBULATORY_CARE_PROVIDER_SITE_OTHER): Payer: BC Managed Care – PPO | Admitting: Obstetrics and Gynecology

## 2021-05-05 ENCOUNTER — Other Ambulatory Visit: Payer: Self-pay

## 2021-05-05 VITALS — BP 121/79 | HR 91 | Wt 192.5 lb

## 2021-05-05 DIAGNOSIS — Z3A30 30 weeks gestation of pregnancy: Secondary | ICD-10-CM

## 2021-05-05 DIAGNOSIS — Z3403 Encounter for supervision of normal first pregnancy, third trimester: Secondary | ICD-10-CM

## 2021-05-05 LAB — POCT URINALYSIS DIPSTICK OB
Bilirubin, UA: NEGATIVE
Blood, UA: NEGATIVE
Glucose, UA: NEGATIVE
Ketones, UA: NEGATIVE
Nitrite, UA: NEGATIVE
POC,PROTEIN,UA: NEGATIVE
Spec Grav, UA: 1.01
Urobilinogen, UA: 0.2 U/dL
pH, UA: 8

## 2021-05-05 NOTE — Progress Notes (Signed)
ROB: No complaints.  Reports baby very active.  Reviewed 28-week labs.

## 2021-05-16 ENCOUNTER — Observation Stay
Admission: EM | Admit: 2021-05-16 | Discharge: 2021-05-16 | Disposition: A | Discharge status: Home / Self Care | Payer: BC Managed Care – PPO | Attending: Obstetrics and Gynecology | Admitting: Obstetrics and Gynecology

## 2021-05-16 ENCOUNTER — Encounter: Payer: Self-pay | Admitting: Obstetrics and Gynecology

## 2021-05-16 ENCOUNTER — Other Ambulatory Visit: Payer: Self-pay

## 2021-05-16 DIAGNOSIS — R1084 Generalized abdominal pain: Secondary | ICD-10-CM

## 2021-05-16 DIAGNOSIS — O26893 Other specified pregnancy related conditions, third trimester: Principal | ICD-10-CM | POA: Insufficient documentation

## 2021-05-16 DIAGNOSIS — Z3A31 31 weeks gestation of pregnancy: Secondary | ICD-10-CM | POA: Insufficient documentation

## 2021-05-16 DIAGNOSIS — R1010 Upper abdominal pain, unspecified: Secondary | ICD-10-CM | POA: Diagnosis not present

## 2021-05-16 NOTE — OB Triage Note (Signed)
Upper abdominal pain starting @ about 0630 this am. Reports taking Tylenol with no relief. Currently pain has gone away as of about 0830. Last BM this am, normal. Denies vaginal bleeding. Reports + fetal movement. Elaina Hoops

## 2021-05-16 NOTE — Final Progress Note (Signed)
L&D OB Triage Note  Sandra Wilkerson is a 25 y.o. G1P0000 female at [redacted]w[redacted]d, EDD Estimated Date of Delivery: 07/13/21 who presented to triage for complaints of upper abdominal pain x 1 episode. Denied nausea/vomiting. 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 Patient took Tylenol at home. She was evaluated by the nurses with no significant findings.  Pain had resolved by the time patient arrived to triage. Vital signs stable. An NST was performed and has been reviewed by MD.   NST INTERPRETATION: Indications: rule out uterine contractions  Mode: External Baseline Rate (A): 130 bpm Variability: Moderate Accelerations: 15 x 15 Decelerations: None     Contraction Frequency (min): ctx x 1  Impression: reactive   Plan: NST performed was reviewed and was found to be reactive. She was discharged home. Continue routine prenatal care. Follow up with OB/GYN as previously scheduled.     Hildred Laser, MD [facility]

## 2021-05-16 NOTE — Progress Notes (Signed)
Discharge home. Labor precautions reviewed. Questions answered. Left floor ambulatory with mother. Elaina Hoops

## 2021-05-19 ENCOUNTER — Encounter: Payer: Self-pay | Admitting: Obstetrics and Gynecology

## 2021-05-19 ENCOUNTER — Ambulatory Visit (INDEPENDENT_AMBULATORY_CARE_PROVIDER_SITE_OTHER): Payer: BC Managed Care – PPO | Admitting: Obstetrics and Gynecology

## 2021-05-19 ENCOUNTER — Other Ambulatory Visit: Payer: Self-pay

## 2021-05-19 VITALS — BP 122/81 | HR 99 | Wt 184.8 lb

## 2021-05-19 DIAGNOSIS — O26893 Other specified pregnancy related conditions, third trimester: Secondary | ICD-10-CM | POA: Diagnosis not present

## 2021-05-19 DIAGNOSIS — Z6791 Unspecified blood type, Rh negative: Secondary | ICD-10-CM | POA: Diagnosis not present

## 2021-05-19 DIAGNOSIS — Z3A32 32 weeks gestation of pregnancy: Secondary | ICD-10-CM

## 2021-05-19 DIAGNOSIS — Z3403 Encounter for supervision of normal first pregnancy, third trimester: Secondary | ICD-10-CM

## 2021-05-19 LAB — POCT URINALYSIS DIPSTICK OB
Bilirubin, UA: NEGATIVE
Blood, UA: NEGATIVE
Glucose, UA: NEGATIVE
Ketones, UA: NEGATIVE
Leukocytes, UA: NEGATIVE
Nitrite, UA: 0.2
Spec Grav, UA: 1.015 (ref 1.010–1.025)
Urobilinogen, UA: NEGATIVE E.U./dL — AB
pH, UA: 7.5 (ref 5.0–8.0)

## 2021-05-19 MED ORDER — RHO D IMMUNE GLOBULIN 1500 UNITS IM SOSY
1500.0000 [IU] | PREFILLED_SYRINGE | Freq: Once | INTRAMUSCULAR | Status: AC
  Administered 2021-05-19: 1500 [IU] via INTRAMUSCULAR

## 2021-05-19 NOTE — Progress Notes (Signed)
ROB: No complaints.  No further issues with upper abdominal pain.  RhoGAM given today.

## 2021-05-28 DIAGNOSIS — K50919 Crohn's disease, unspecified, with unspecified complications: Secondary | ICD-10-CM | POA: Diagnosis not present

## 2021-06-06 ENCOUNTER — Ambulatory Visit (INDEPENDENT_AMBULATORY_CARE_PROVIDER_SITE_OTHER): Payer: BC Managed Care – PPO | Admitting: Obstetrics and Gynecology

## 2021-06-06 ENCOUNTER — Other Ambulatory Visit: Payer: Self-pay

## 2021-06-06 ENCOUNTER — Encounter: Payer: Self-pay | Admitting: Obstetrics and Gynecology

## 2021-06-06 VITALS — BP 127/84 | HR 101 | Wt 193.1 lb

## 2021-06-06 DIAGNOSIS — R002 Palpitations: Secondary | ICD-10-CM

## 2021-06-06 DIAGNOSIS — Z3403 Encounter for supervision of normal first pregnancy, third trimester: Secondary | ICD-10-CM

## 2021-06-06 DIAGNOSIS — E669 Obesity, unspecified: Secondary | ICD-10-CM

## 2021-06-06 DIAGNOSIS — Z3A34 34 weeks gestation of pregnancy: Secondary | ICD-10-CM

## 2021-06-06 LAB — POCT URINALYSIS DIPSTICK OB
Bilirubin, UA: NEGATIVE
Blood, UA: NEGATIVE
Glucose, UA: NEGATIVE
Ketones, UA: NEGATIVE
Leukocytes, UA: NEGATIVE
Nitrite, UA: NEGATIVE
Spec Grav, UA: 1.02 (ref 1.010–1.025)
Urobilinogen, UA: 0.2 E.U./dL
pH, UA: 6.5 (ref 5.0–8.0)

## 2021-06-06 NOTE — Progress Notes (Signed)
ROB: She is doing well today, she has no new concerns. 

## 2021-06-06 NOTE — Progress Notes (Signed)
ROB: Notes mild swelling in feet. Also complains of occasional feelings of "heart racing". Discussed occasional episodes of tachycardia normal, more concern if persistent. Discussed local pediatricians.  Discussed pain management in labor, desires epidural.  Breastfeeding discussed. RTC in 2 weeks.   The following were addressed during this visit:  Breastfeeding Education - Early initiation of breastfeeding    Comments: Keeps milk supply adequate, helps contract uterus and slow bleeding, and early milk is the perfect first food and is easy to digest.   - Risks of giving your baby anything other than breast milk if you are breastfeeding    Comments: Make the baby less content with breastfeeds, may make my baby more susceptible to illness, and may reduce my milk supply.   - Nonpharmacological pain relief methods for labor    Comments: Deep breathing, focusing on pleasant things, movement and walking, heating pads or cold compress, massage and relaxation, continuous support from someone you trust, and Doulas   - The importance of early skin-to-skin contact    Comments:  Keeps baby warm and secure, helps keep baby's blood sugar up and breathing steady, easier to bond and breastfeed, and helps calm baby.  - Rooming-in on a 24-hour basis    Comments: Easier to learn baby's feeding cues, easier to bond and get to know each other, and encourages milk production.   - Feeding on demand or baby-led feeding    Comments: Helps prevent breastfeeding complications, helps bring in good milk supply, prevents under or overfeeding, and helps baby feel content and satisfied   - Frequent feeding to help assure optimal milk production    Comments: Making a full supply of milk requires frequent removal of milk from breasts, infant will eat 8-12 times in 24 hours, if separated from infant use breast massage, hand expression and/ or pumping to remove milk from breasts.   - Individualized Education     Comments: Contraindications to breastfeeding and other special medical conditions

## 2021-06-06 NOTE — Patient Instructions (Signed)

## 2021-06-08 ENCOUNTER — Encounter: Payer: BC Managed Care – PPO | Admitting: Obstetrics and Gynecology

## 2021-06-20 NOTE — Patient Instructions (Addendum)
Influenza (Flu) Vaccine (Inactivated or Recombinant): What You Need to Know 1. Why get vaccinated? Influenza vaccine can prevent influenza (flu). Flu is a contagious disease that spreads around the [place] every year, usually between October and May. Anyone can get the flu, but it is more dangerous for some people. Infants and young children, people 42 years and older, pregnant people, and people with certain health conditions or a weakened immune system are at greatest risk of flu complications. Pneumonia, bronchitis, sinus infections, and ear infections are examples of flu-related complications. If you have a medical condition, such as heart disease, cancer, or diabetes, flu can make it worse. Flu can cause fever and chills, sore throat, muscle aches, fatigue, cough, headache, and runny or stuffy nose. Some people may have vomiting and diarrhea, though this is more common in children than adults. In an average year, thousands of people in the [place] die from flu, and many more are hospitalized. Flu vaccine prevents millions of illnesses and flu-related visits to the doctor each year. 2. Influenza vaccines CDC recommends everyone 6 months and older get vaccinated every flu season. Children 6 months through 44 years of age may need 2 doses during a single flu season. Everyone else needs only 1 dose each flu season. It takes about 2 weeks for protection to develop after vaccination. There are many flu viruses, and they are always changing. Each year a new flu vaccine is made to protect against the influenza viruses believed to be likely to cause disease in the upcoming flu season. Even when the vaccine doesn't exactly match these viruses, it may still provide some protection. Influenza vaccine does not cause flu. Influenza vaccine may be given at the same time as other vaccines. 3. Talk with your health care provider Tell your vaccination provider if the person getting the vaccine: Has had  an allergic reaction after a previous dose of influenza vaccine, or has any severe, life-threatening allergies Has ever had Guillain-Barr Syndrome (also called "GBS") In some cases, your health care provider may decide to postpone influenza vaccination until a future visit. Influenza vaccine can be administered at any time during pregnancy. People who are or will be pregnant during influenza season should receive inactivated influenza vaccine. People with minor illnesses, such as a cold, may be vaccinated. People who are moderately or severely ill should usually wait until they recover before getting influenza vaccine. Your health care provider can give you more information. 4. Risks of a vaccine reaction Soreness, redness, and swelling where the shot is given, fever, muscle aches, and headache can happen after influenza vaccination. There may be a very small increased risk of Guillain-Barr Syndrome (GBS) after inactivated influenza vaccine (the flu shot). Young children who get the flu shot along with pneumococcal vaccine (PCV13) and/or DTaP vaccine at the same time might be slightly more likely to have a seizure caused by fever. Tell your health care provider if a child who is getting flu vaccine has ever had a seizure. People sometimes faint after medical procedures, including vaccination. Tell your provider if you feel dizzy or have vision changes or ringing in the ears. As with any medicine, there is a very remote chance of a vaccine causing a severe allergic reaction, other serious injury, or death. 5. What if there is a serious problem? An allergic reaction could occur after the vaccinated person leaves the clinic. If you see signs of a severe allergic reaction (hives, swelling of the face and throat, difficulty breathing,  a fast heartbeat, dizziness, or weakness), call 9-1-1 and get the person to the nearest hospital. For other signs that concern you, call your health care provider. Adverse  reactions should be reported to the Vaccine Adverse Event Reporting System (VAERS). Your health care provider will usually file this report, or you can do it yourself. Visit the VAERS website at www.vaers.SamedayNews.es or call (641)404-1902. VAERS is only for reporting reactions, and VAERS staff members do not give medical advice. 6. The National Vaccine Injury Compensation Program The [employer] Vaccine Injury Compensation Program (VICP) is a federal program that was created to compensate people who may have been injured by certain vaccines. Claims regarding alleged injury or death due to vaccination have a time limit for filing, which may be as short as two years. Visit the VICP website at GoldCloset.com.ee or call (248) 879-5676 to learn about the program and about filing a claim. 7. How can I learn more? Ask your health care provider. Call your local or state health department. Visit the website of the Food and Drug Administration (FDA) for vaccine package inserts and additional information at TraderRating.uy. Contact the Centers for Disease Control and Prevention (CDC): Call 305-723-4239 (1-800-CDC-INFO) or Visit CDC's website at https://gibson.com/. Vaccine Information Statement Inactivated Influenza Vaccine (05/14/2020) This information is not intended to replace advice given to you by your health care provider. Make sure you discuss any questions you have with your health care provider. Document Revised: 07/01/2020 Document Reviewed: 07/01/2020 Elsevier Patient Education  2022 [place] of Pregnancy The third trimester of pregnancy is from week 28 through week 62. This is also called months 7 through 9. This trimester is when your unborn baby (fetus) is growing very fast. At the end of the ninth month, the unborn baby is about 20 inches long. It weighs about 6-10 pounds. Body changes during your third trimester Your body continues to go  through many changes during this time. The changes vary and generally return to normal after the baby is born. Physical changes Your weight will continue to increase. You may gain 25-35 pounds (11-16 kg) by the end of the pregnancy. If you are underweight, you may gain 28-40 lb (about 13-18 kg). If you are overweight, you may gain 15-25 lb (about 7-11 kg). You may start to get stretch marks on your hips, belly (abdomen), and breasts. Your breasts will continue to grow and may hurt. A yellow fluid (colostrum) may leak from your breasts. This is the first milk you are making for your baby. You may have changes in your hair. Your belly button may stick out. You may have more swelling in your hands, face, or ankles. Health changes You may have heartburn. You may have trouble pooping (constipation). You may get hemorrhoids. These are swollen veins in the butt that can itch or get painful. You may have swollen veins (varicose veins) in your legs. You may have more body aches in the pelvis, back, or thighs. You may have more tingling or numbness in your hands, arms, and legs. The skin on your belly may also feel numb. You may feel short of breath as your womb (uterus) gets bigger. Other changes You may pee (urinate) more often. You may have more problems sleeping. You may notice the unborn baby "dropping," or moving lower in your belly. You may have more discharge coming from your vagina. Your joints may feel loose, and you may have pain around your pelvic bone. Follow these instructions at home: Medicines Take over-the-counter  and prescription medicines only as told by your doctor. Some medicines are not safe during pregnancy. Take a prenatal vitamin that contains at least 600 micrograms (mcg) of folic acid. Eating and drinking Eat healthy meals that include: Fresh fruits and vegetables. Whole grains. Good sources of protein, such as meat, eggs, or tofu. Low-fat dairy products. Avoid raw  meat and unpasteurized juice, milk, and cheese. These carry germs that can harm you and your baby. Eat 4 or 5 small meals rather than 3 large meals a day. You may need to take these actions to prevent or treat trouble pooping: Drink enough fluids to keep your pee (urine) pale yellow. Eat foods that are high in fiber. These include beans, whole grains, and fresh fruits and vegetables. Limit foods that are high in fat and sugar. These include fried or sweet foods. Activity Exercise only as told by your doctor. Stop exercising if you start to have cramps in your womb. Avoid heavy lifting. Do not exercise if it is too hot or too humid, or if you are in a place of great height (high altitude). If you choose to, you may have sex unless your doctor tells you not to. Relieving pain and discomfort Take breaks often, and rest with your legs raised (elevated) if you have leg cramps or low back pain. Take warm water baths (sitz baths) to soothe pain or discomfort caused by hemorrhoids. Use hemorrhoid cream if your doctor approves. Wear a good support bra if your breasts are tender. If you develop bulging, swollen veins in your legs: Wear support hose as told by your doctor. Raise your feet for 15 minutes, 3-4 times a day. Limit salt in your food. Safety Talk to your doctor before traveling far distances. Do not use hot tubs, steam rooms, or saunas. Wear your seat belt at all times when you are in a car. Talk with your doctor if someone is hurting you or yelling at you a lot. Preparing for your baby's arrival To prepare for the arrival of your baby: Take prenatal classes. Visit the hospital and tour the maternity area. Buy a rear-facing car seat. Learn how to install it in your car. Prepare the baby's room. Take out all pillows and stuffed animals from the baby's crib. General instructions Avoid cat litter boxes and soil used by cats. These carry germs that can cause harm to the baby and can cause  a loss of your baby by miscarriage or stillbirth. Do not douche or use tampons. Do not use scented sanitary pads. Do not smoke or use any products that contain nicotine or tobacco. If you need help quitting, ask your doctor. Do not drink alcohol. Do not use herbal medicines, illegal drugs, or medicines that were not approved by your doctor. Chemicals in these products can affect your baby. Keep all follow-up visits. This is important. Where to find more information American Pregnancy Association: americanpregnancy.org [employer]: www.acog.org Office on Women's Health: KeywordPortfolios.com.br Contact a doctor if: You have a fever. You have mild cramps or pressure in your lower belly. You have a nagging pain in your belly area. You vomit, or you have watery poop (diarrhea). You have bad-smelling fluid coming from your vagina. You have pain when you pee, or your pee smells bad. You have a headache that does not go away when you take medicine. You have changes in how you see, or you see spots in front of your eyes. Get help right away if: Your  water breaks. You have regular contractions that are less than 5 minutes apart. You are spotting or bleeding from your vagina. You have very bad belly cramps or pain. You have trouble breathing. You have chest pain. You faint. You have not felt the baby move for the amount of time told by your doctor. You have new or increased pain, swelling, or redness in an arm or leg. Summary The third trimester is from week 28 through week 40 (months 7 through 9). This is the time when your unborn baby is growing very fast. During this time, your discomfort may increase as you gain weight and as your baby grows. Get ready for your baby to arrive by taking prenatal classes, buying a rear-facing car seat, and preparing the baby's room. Get help right away if you are bleeding from your vagina, you have chest pain and  trouble breathing, or you have not felt the baby move for the amount of time told by your doctor. This information is not intended to replace advice given to you by your health care provider. Make sure you discuss any questions you have with your health care provider. Document Revised: 03/03/2020 Document Reviewed: 01/08/2020 Elsevier Patient Education  [place]. Common Medications Safe in Pregnancy  Acne:      Constipation:  Benzoyl Peroxide     Colace  Clindamycin      Dulcolax Suppository  Topica Erythromycin     Fibercon  Salicylic Acid      Metamucil         Miralax AVOID:        Senakot   Accutane    Cough:  Retin-A       Cough Drops  Tetracycline      Phenergan w/ Codeine if Rx  Minocycline      Robitussin (Plain & DM)  Antibiotics:     Crabs/Lice:  Ceclor       RID  Cephalosporins    AVOID:  E-Mycins      Kwell  Keflex  Macrobid/Macrodantin   Diarrhea:  Penicillin      Kao-Pectate  Zithromax      Imodium AD         PUSH FLUIDS AVOID:       Cipro     Fever:  Tetracycline      Tylenol (Regular or Extra  Minocycline       Strength)  Levaquin      Extra Strength-Do not          Exceed 8 tabs/24 hrs Caffeine:        <238m/day (equiv. To 1 cup of coffee or  approx. 3 12 oz sodas)         Gas: Cold/Hayfever:       Gas-X  Benadryl      Mylicon  Claritin       Phazyme  **Claritin-D        Chlor-Trimeton    Headaches:  Dimetapp      ASA-Free Excedrin  Drixoral-Non-Drowsy     Cold Compress  Mucinex (Guaifenasin)     Tylenol (Regular or Extra  Sudafed/Sudafed-12 Hour     Strength)  **Sudafed PE Pseudoephedrine   Tylenol Cold & Sinus     Vicks Vapor Rub  Zyrtec  **AVOID if Problems With Blood Pressure         Heartburn: Avoid lying down for at least 1 hour after meals  Aciphex      Maalox     Rash:  Milk of Magnesia     Benadryl    Mylanta       1% Hydrocortisone Cream  Pepcid  Pepcid Complete   Sleep  Aids:  Prevacid      Ambien   Prilosec       Benadryl  Rolaids       Chamomile Tea  Tums (Limit 4/day)     Unisom         Tylenol PM         Warm milk-add vanilla or  Hemorrhoids:       Sugar for taste  Anusol/Anusol H.C.  (RX: Analapram 2.5%)  Sugar Substitutes:  Hydrocortisone OTC     Ok in moderation  Preparation H      Tucks        Vaseline lotion applied to tissue with wiping    Herpes:     Throat:  Acyclovir      Oragel  Famvir  Valtrex     Vaccines:         Flu Shot Leg Cramps:       *Gardasil  Benadryl      Hepatitis A         Hepatitis B Nasal Spray:       Pneumovax  Saline Nasal Spray     Polio Booster         Tetanus Nausea:       Tuberculosis test or PPD  Vitamin B6 25 mg TID   AVOID:    Dramamine      *Gardasil  Emetrol       Live Poliovirus  Ginger Root 250 mg QID    MMR (measles, mumps &  High Complex Carbs @ Bedtime    rebella)  Sea Bands-Accupressure    Varicella (Chickenpox)  Unisom 1/2 tab TID     *No known complications           If received before Pain:         Known pregnancy;   Darvocet       Resume series after  Lortab        Delivery  Percocet    Yeast:   Tramadol      Femstat  Tylenol 3      Gyne-lotrimin  Ultram       Monistat  Vicodin           MISC:         All Sunscreens           Hair Coloring/highlights          Insect Repellant's          (Including DEET)         Mystic Tans

## 2021-06-21 ENCOUNTER — Other Ambulatory Visit: Payer: Self-pay

## 2021-06-21 ENCOUNTER — Encounter: Payer: Self-pay | Admitting: Obstetrics and Gynecology

## 2021-06-21 ENCOUNTER — Ambulatory Visit (INDEPENDENT_AMBULATORY_CARE_PROVIDER_SITE_OTHER): Payer: BC Managed Care – PPO | Admitting: Obstetrics and Gynecology

## 2021-06-21 VITALS — BP 113/82 | HR 101 | Wt 192.5 lb

## 2021-06-21 DIAGNOSIS — Z3403 Encounter for supervision of normal first pregnancy, third trimester: Secondary | ICD-10-CM

## 2021-06-21 DIAGNOSIS — Z3A36 36 weeks gestation of pregnancy: Secondary | ICD-10-CM

## 2021-06-21 LAB — POCT URINALYSIS DIPSTICK OB
Bilirubin, UA: NEGATIVE
Glucose, UA: NEGATIVE
Ketones, UA: NEGATIVE
Nitrite, UA: NEGATIVE
POC,PROTEIN,UA: NEGATIVE
Spec Grav, UA: 1.015 (ref 1.010–1.025)
Urobilinogen, UA: 0.2 E.U./dL
pH, UA: 6.5 (ref 5.0–8.0)

## 2021-06-21 NOTE — Progress Notes (Signed)
ROB: Patient complains of recurrence of yeast infection.  She reports that she has used Monistat during this pregnancy and her symptoms have resolved for several weeks but seem to come back.  Encouraged use of Monistat again.  Patient also complains of some midline pelvic pain and right flank pain.  Urine sent for C&S.  Cultures performed today GC/CT-GBS.  GBS discussed in detail with the patient.  All questions answered.

## 2021-06-21 NOTE — Progress Notes (Signed)
   OB-Pt present for routine prenatal care. Pt stated fetal movement present; no contractions present; no vaginal bleeding and no changes in vaginal discharge.     Pt c/o lower back and abd pain. Pt declined flu vaccine at this visit will get at next visit.

## 2021-06-23 LAB — STREP GP B NAA: Strep Gp B NAA: POSITIVE — AB

## 2021-06-24 LAB — GC/CHLAMYDIA PROBE AMP
Chlamydia trachomatis, NAA: NEGATIVE
Neisseria Gonorrhoeae by PCR: NEGATIVE

## 2021-06-24 IMAGING — US US OB COMP +14 WK
1 series · 15 of 28 positions shown · non-contrast
Comparison: none

CLINICAL DATA: Second trimester pregnancy for fetal anatomy survey.

EXAM:
OBSTETRICAL ULTRASOUND >14 WKS

[Series 1: us ob comp + 14 wk · 15 of 85 slices shown]
[im 1/85]
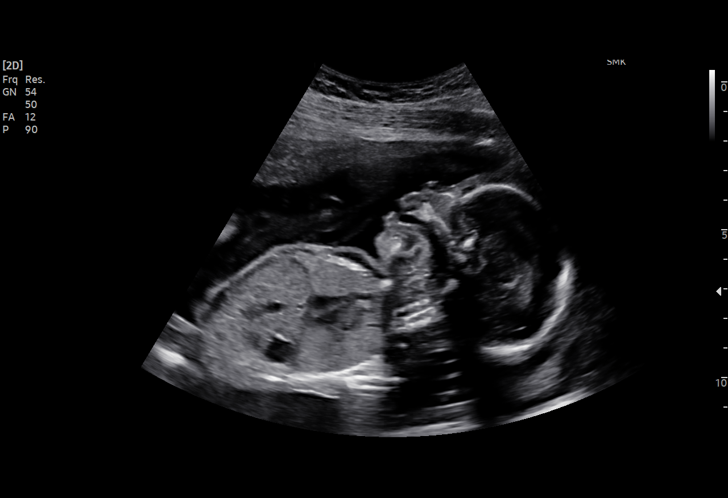
[im 7/85]
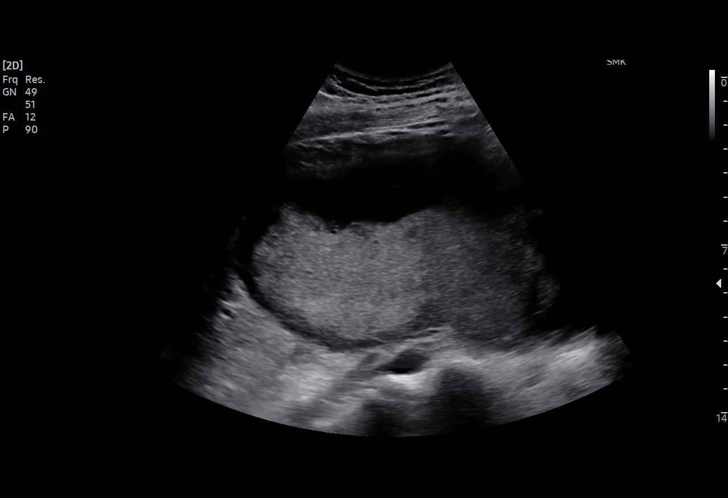
[im 13/85]
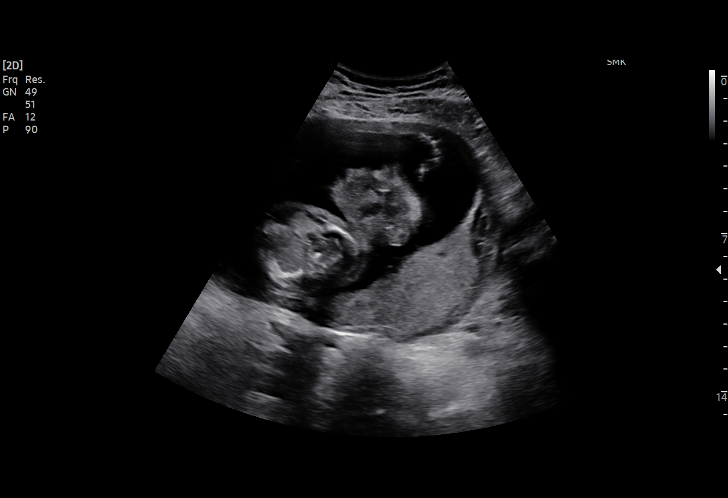
[im 19/85]
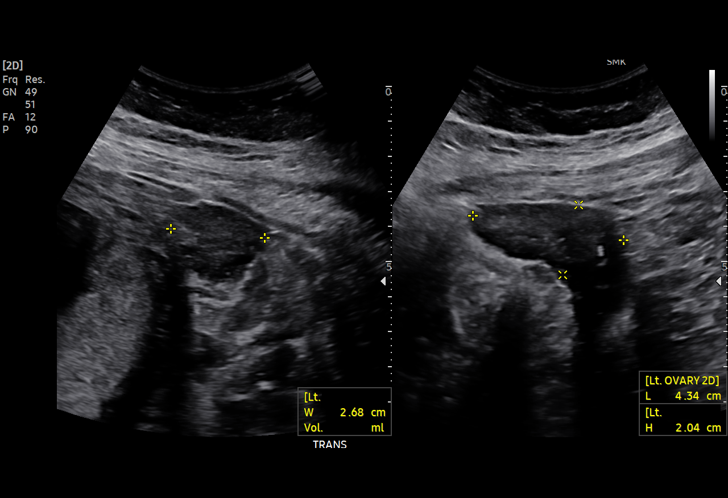
[im 25/85]
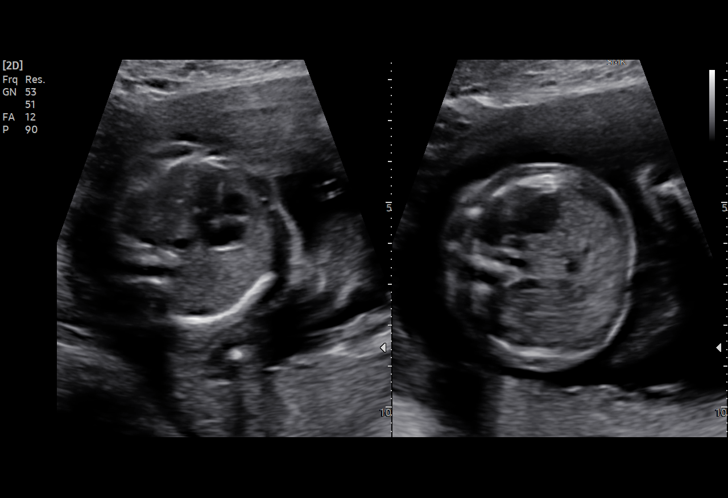
[im 32/85]
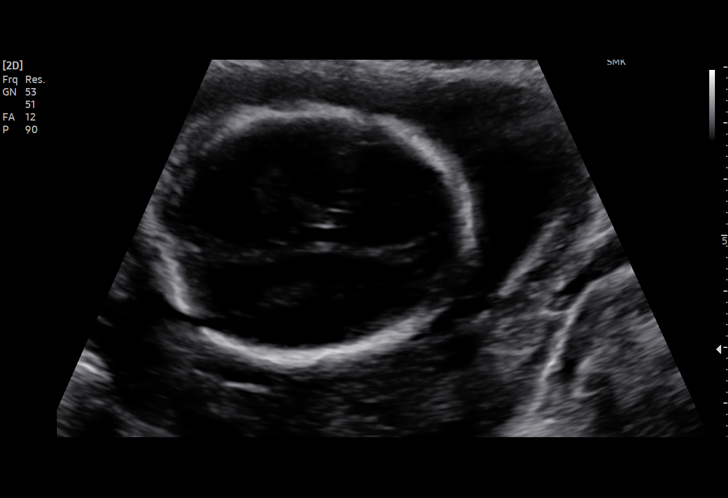
[im 38/85]
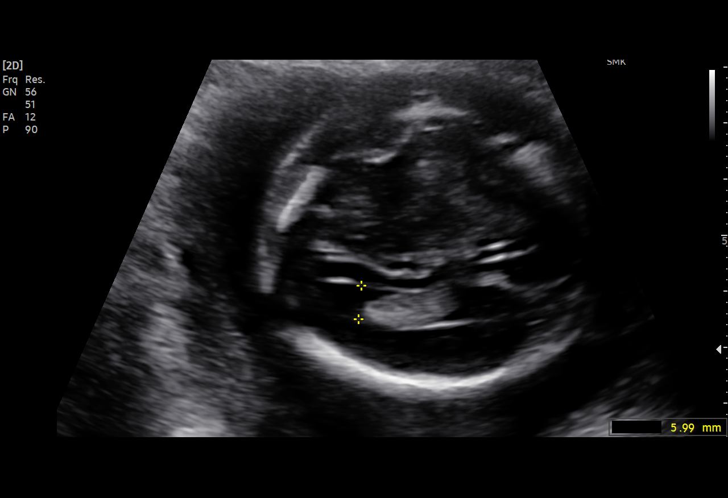
[im 44/85]
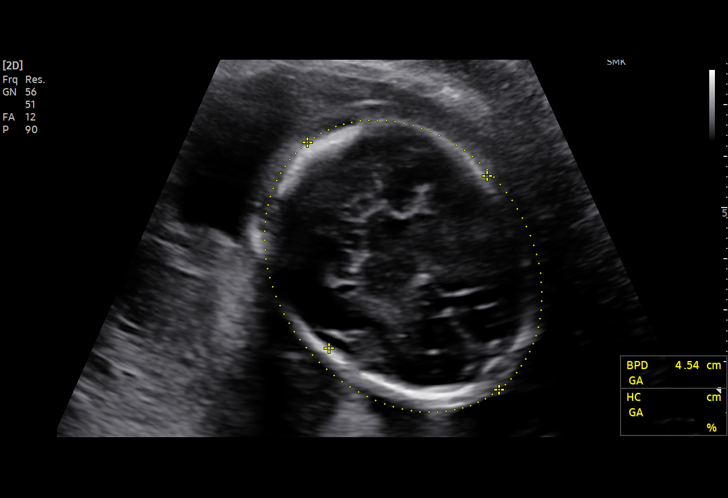
[im 47/85]
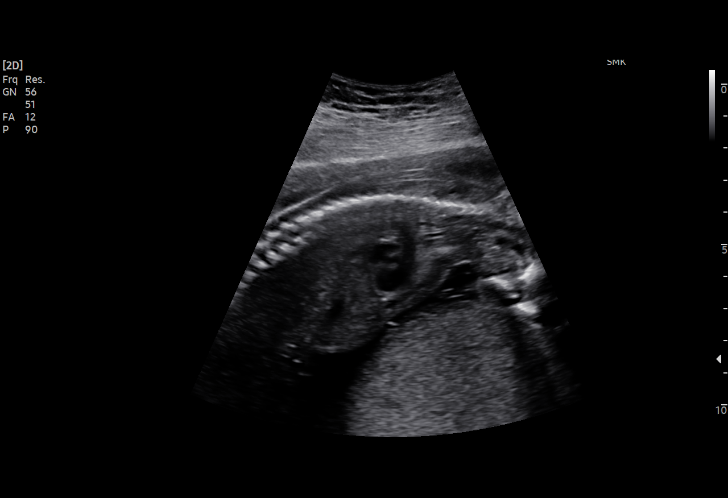
[im 53/85]
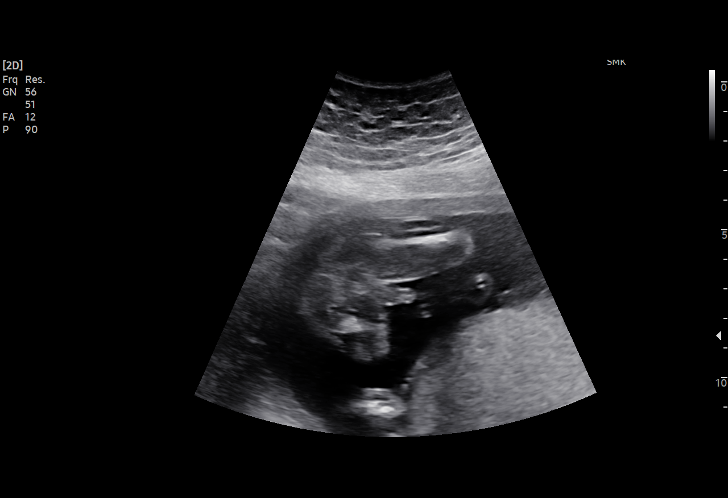
[im 60/85]
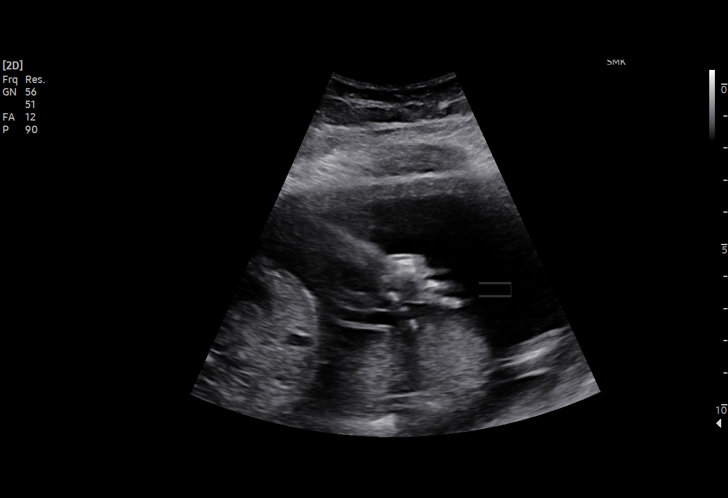
[im 66/85]
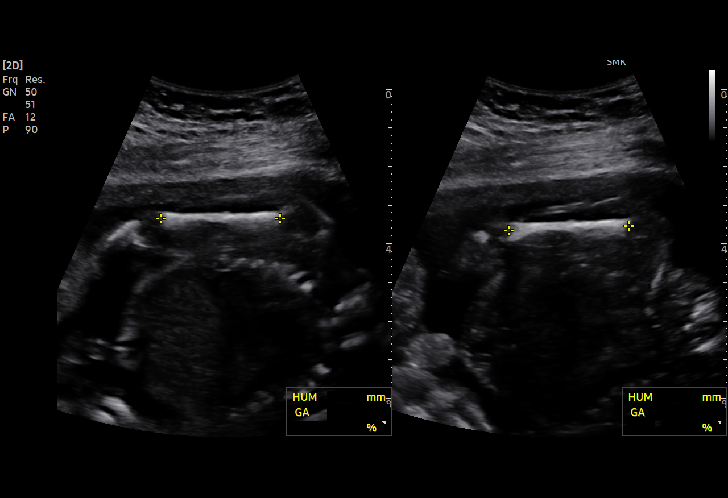
[im 72/85]
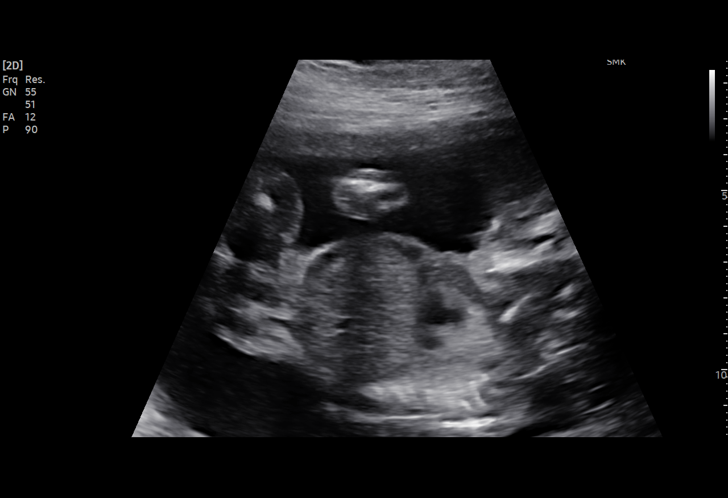
[im 78/85]
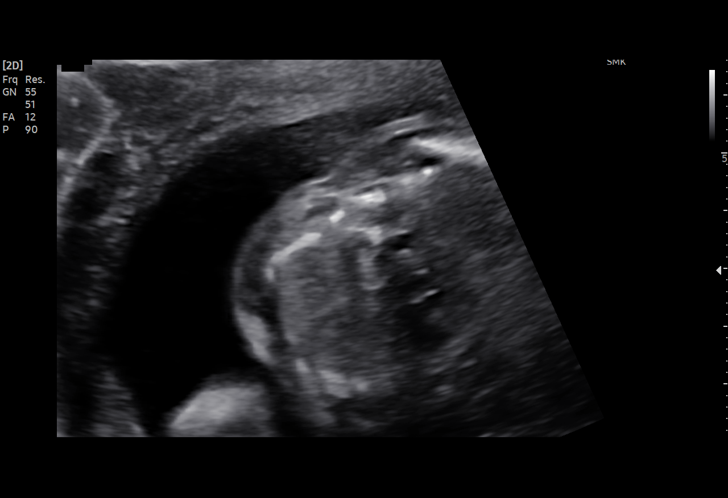
[im 85/85]
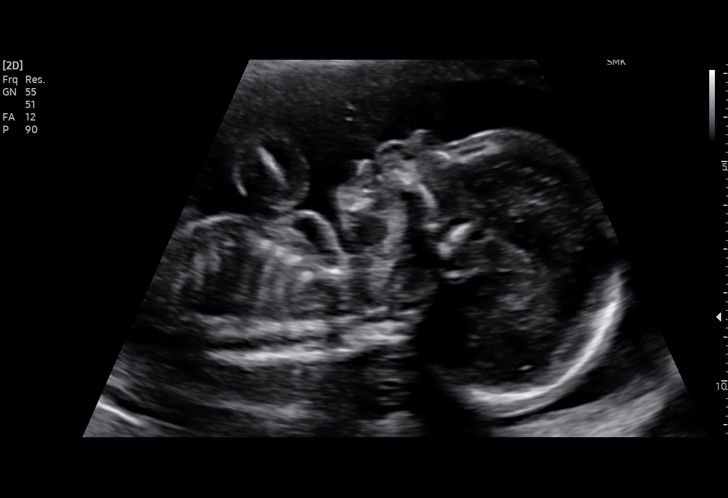

[15 of 28 positions shown; findings below may reference images not displayed]

FINDINGS: Number of Fetuses: 1

Heart Rate: Cardiac activity seen, however heart rate not recorded
by sonographer

Movement: Yes

Presentation: Cephalic

Previa: No

Placental Location: Posterior

Amniotic Fluid (Subjective): Within normal limits

Amniotic Fluid (Objective):

Vertical pocket = 6.5cm

FETAL BIOMETRY

BPD: 4.7cm 20w 2d

HC:   17.7cm 20w 1d

AC:   16.3cm 21w 3d

FL:   3.6cm 21w 3d

Current Mean GA: 20w 5d US EDC: 07/09/2021

Assigned GA:  20w 1d Assigned EDC: 07/13/2021

FETAL ANATOMY

Lateral Ventricles: Appears normal

Thalami/CSP: Appears normal

Posterior Fossa:  Appears normal

Nuchal Region: Appears normal   NFT= N/A > 20 WKS

Upper Lip: Appears normal

Spine: Appears normal

4 Chamber Heart on Left: Appears normal

LVOT: Appears normal

RVOT: Appears normal

Stomach on Left: Appears normal

3 Vessel Cord: Appears normal

Cord Insertion site: Appears normal

Kidneys: Appears normal

Bladder: Appears normal

Extremities: Appears normal

Sex: Male

Technically difficult due to: Fetal position

Maternal Findings:

Cervix:  3.5 cm TA
IMPRESSION: Assigned GA currently 20 weeks 1 day.  Appropriate fetal growth.

Unremarkable fetal anatomic survey.  No fetal anomalies identified.

## 2021-06-25 DIAGNOSIS — K50919 Crohn's disease, unspecified, with unspecified complications: Secondary | ICD-10-CM | POA: Diagnosis not present

## 2021-06-29 ENCOUNTER — Other Ambulatory Visit: Payer: Self-pay

## 2021-06-29 ENCOUNTER — Ambulatory Visit (INDEPENDENT_AMBULATORY_CARE_PROVIDER_SITE_OTHER): Payer: BC Managed Care – PPO | Admitting: Obstetrics and Gynecology

## 2021-06-29 ENCOUNTER — Encounter: Payer: Self-pay | Admitting: Obstetrics and Gynecology

## 2021-06-29 VITALS — BP 114/80 | HR 109 | Wt 195.7 lb

## 2021-06-29 DIAGNOSIS — O99820 Streptococcus B carrier state complicating pregnancy: Secondary | ICD-10-CM | POA: Insufficient documentation

## 2021-06-29 DIAGNOSIS — Z3403 Encounter for supervision of normal first pregnancy, third trimester: Secondary | ICD-10-CM

## 2021-06-29 DIAGNOSIS — Z3A38 38 weeks gestation of pregnancy: Secondary | ICD-10-CM

## 2021-06-29 LAB — POCT URINALYSIS DIPSTICK OB
Bilirubin, UA: NEGATIVE
Blood, UA: NEGATIVE
Glucose, UA: NEGATIVE
Ketones, UA: NEGATIVE
Nitrite, UA: NEGATIVE
Spec Grav, UA: 1.015 (ref 1.010–1.025)
Urobilinogen, UA: 0.2 E.U./dL
pH, UA: 6.5 (ref 5.0–8.0)

## 2021-06-29 NOTE — Patient Instructions (Signed)
Group B Streptococcus Infection During Pregnancy Group B Streptococcus (GBS) is a type of bacteria that is often found in healthy people. It is commonly found in the rectum, vagina, and intestines. In people who are healthy and not pregnant, the bacteria rarely cause serious illness or complications. However, women who test positive for GBS during pregnancy can pass the bacteria to the baby during childbirth. This can cause serious infection in the baby after birth. Women with GBS may also have infections during their pregnancy or soon after childbirth. The infections include urinary tract infections (UTIs) or infections of the uterus. GBS also increases a woman's risk of complications during pregnancy, such as early labor or delivery, miscarriage, or stillbirth. Routine testing for GBS is recommended for all pregnant women. What are the causes? This condition is caused by bacteria called Streptococcus agalactiae. What increases the risk? You may have a higher risk for GBS infection during pregnancy if you had one during a past pregnancy. What are the signs or symptoms? In most cases, GBS infection does not cause symptoms in pregnant women. If symptoms exist, they may include: Labor that starts before the 37th week of pregnancy. A UTI or bladder infection. This may cause a fever, frequent urination, or pain and burning during urination. Fever during labor. There can also be a rapid heartbeat in the mother or baby. Rare but serious symptoms of a GBS infection in women include: Blood infection (septicemia). This may cause fever, chills, or confusion. Lung infection (pneumonia). This may cause fever, chills, cough, rapid breathing, chest pain, or difficulty breathing. Bone, joint, skin, or soft tissue infection. How is this diagnosed? You may be screened for GBS between week 35 and week 37 of pregnancy. If you have symptoms of preterm labor, you may be screened earlier. This condition is diagnosed  based on lab test results from: A swab of fluid from the vagina and rectum. A urine sample. How is this treated? This condition is treated with antibiotic medicine. Antibiotic medicine may be given: To you when you go into labor, or as soon as your water breaks. The medicines will continue until after you give birth. If you are having a cesarean delivery, you do not need antibiotics unless your water has broken. To your baby, if he or she requires treatment. Your health care provider will check your baby to decide if he or she needs antibiotics to prevent a serious infection. Follow these instructions at home: Take over-the-counter and prescription medicines only as told by your health care provider. Take your antibiotic medicine as told by your health care provider. Do not stop taking the antibiotic even if you start to feel better. Keep all pre-birth (prenatal) visits and follow-up visits as told by your health care provider. This is important. Contact a health care provider if: You have pain or burning when you urinate. You have to urinate more often than usual. You have a fever or chills. You develop a bad-smelling vaginal discharge. Get help right away if: Your water breaks. You go into labor. You have severe pain in your abdomen. You have difficulty breathing. You have chest pain. These symptoms may represent a serious problem that is an emergency. Do not wait to see if the symptoms will go away. Get medical help right away. Call your local emergency services (911 in the [place] Do not drive yourself to the hospital. Summary GBS is a type of bacteria that is common in healthy people. During pregnancy, colonization with GBS can cause   serious complications for you or your baby. Your health care provider will screen you between 35 and 37 weeks of pregnancy to determine if you are colonized with GBS. If you are colonized with GBS during pregnancy, your health care provider will recommend  antibiotics through an IV during labor. After delivery, your baby will be evaluated for complications related to potential GBS infection and may require antibiotics to prevent a serious infection. This information is not intended to replace advice given to you by your health care provider. Make sure you discuss any questions you have with your health care provider. Document Revised: 07/27/2020 Document Reviewed: 04/21/2019 Elsevier Patient Education  2022 [employer]    Signs and Symptoms of Labor Labor is the body's natural process of moving the baby and the placenta out of the uterus. The process of labor usually starts when the baby is full-term, between 69 and 40 weeks of pregnancy. Signs and symptoms that you are close to going into labor As your body prepares for labor and the birth of your baby, you may notice the following symptoms in the weeks and days before true labor starts: Passing a small amount of thick, bloody mucus from your vagina. This is called normal bloody show or losing your mucus plug. This may happen more than a week before labor begins, or right before labor begins, as the opening of the cervix starts to widen (dilate). For some women, the entire mucus plug passes at once. For others, pieces of the mucus plug may gradually pass over several days. Your baby moving (dropping) lower in your pelvis to get into position for birth (lightening). When this happens, you may feel more pressure on your bladder and pelvic bone and less pressure on your ribs. This may make it easier to breathe. It may also cause you to need to urinate more often and have problems with bowel movements. Having "practice contractions," also called Braxton Hicks contractions or false labor. These occur at irregular (unevenly spaced) intervals that are more than 10 minutes apart. False labor contractions are common after exercise or sexual activity. They will stop if you change position, rest, or drink  fluids. These contractions are usually mild and do not get stronger over time. They may feel like: A backache or back pain. Mild cramps, similar to menstrual cramps. Tightening or pressure in your abdomen. Other early symptoms include: Nausea or loss of appetite. Diarrhea. Having a sudden burst of energy, or feeling very tired. Mood changes. Having trouble sleeping. Signs and symptoms that labor has begun Signs that you are in labor may include: Having contractions that come at regular (evenly spaced) intervals and increase in intensity. This may feel like more intense tightening or pressure in your abdomen that moves to your back. Contractions may also feel like rhythmic pain in your upper thighs or back that comes and goes at regular intervals. For first-time mothers, this change in intensity of contractions often occurs at a more gradual pace. Women who have given birth before may notice a more rapid progression of contraction changes. Feeling pressure in the vaginal area. Your water breaking (rupture of membranes). This is when the sac of fluid that surrounds your baby breaks. Fluid leaking from your vagina may be clear or blood-tinged. Labor usually starts within 24 hours of your water breaking, but it may take longer to begin. Some women may feel a sudden gush of fluid. Others notice that their underwear repeatedly becomes damp. Follow these instructions at home:  When labor starts, or if your water breaks, call your health care provider or nurse care line. Based on your situation, they will determine when you should go in for an exam. During early labor, you may be able to rest and manage symptoms at home. Some strategies to try at home include: Breathing and relaxation techniques. Taking a warm bath or shower. Listening to music. Using a heating pad on the lower back for pain. If you are directed to use heat: Place a towel between your skin and the heat source. Leave the heat on  for 20-30 minutes. Remove the heat if your skin turns bright red. This is especially important if you are unable to feel pain, heat, or cold. You may have a greater risk of getting burned. Contact a health care provider if: Your labor has started. Your water breaks. Get help right away if: You have painful, regular contractions that are 5 minutes apart or less. Labor starts before you are [redacted] weeks along in your pregnancy. You have a fever. You have bright red blood coming from your vagina. You do not feel your baby moving. You have a severe headache with or without vision problems. You have severe nausea, vomiting, or diarrhea. You have chest pain or shortness of breath. These symptoms may represent a serious problem that is an emergency. Do not wait to see if the symptoms will go away. Get medical help right away. Call your local emergency services (911 in the [place] Do not drive yourself to the hospital. Summary Labor is your body's natural process of moving your baby and the placenta out of your uterus. The process of labor usually starts when your baby is full-term, between 30 and 40 weeks of pregnancy. When labor starts, or if your water breaks, call your health care provider or nurse care line. Based on your situation, they will determine when you should go in for an exam. This information is not intended to replace advice given to you by your health care provider. Make sure you discuss any questions you have with your health care provider. Document Revised: 07/17/2020 Document Reviewed: 07/17/2020 Elsevier Patient Education  2022 [employer].

## 2021-06-29 NOTE — Progress Notes (Signed)
ROROB: Patient doing well, no issues. Discussed GBS status again, answered all questions. Discussed s/s of labor. Declines flu vaccine, may consider at next visit. RTC in 1 week.

## 2021-06-29 NOTE — Progress Notes (Signed)
ROB: She is doing well today, no new concerns. 

## 2021-07-07 ENCOUNTER — Other Ambulatory Visit: Payer: Self-pay

## 2021-07-07 ENCOUNTER — Ambulatory Visit (INDEPENDENT_AMBULATORY_CARE_PROVIDER_SITE_OTHER): Payer: BC Managed Care – PPO | Admitting: Obstetrics and Gynecology

## 2021-07-07 VITALS — BP 115/80 | HR 109 | Wt 194.8 lb

## 2021-07-07 DIAGNOSIS — Z3403 Encounter for supervision of normal first pregnancy, third trimester: Secondary | ICD-10-CM

## 2021-07-07 DIAGNOSIS — Z3A39 39 weeks gestation of pregnancy: Secondary | ICD-10-CM

## 2021-07-07 LAB — POCT URINALYSIS DIPSTICK OB
Bilirubin, UA: NEGATIVE
Blood, UA: NEGATIVE
Glucose, UA: NEGATIVE
Ketones, UA: NEGATIVE
Leukocytes, UA: NEGATIVE
Nitrite, UA: NEGATIVE
POC,PROTEIN,UA: NEGATIVE
Spec Grav, UA: 1.015 (ref 1.010–1.025)
Urobilinogen, UA: 0.2 E.U./dL
pH, UA: 6.5 (ref 5.0–8.0)

## 2021-07-07 NOTE — Progress Notes (Signed)
ROB: Patient denies significant contractions.  She is otherwise well.  Signs and symptoms of labor discussed.  BPP with next visit.  Possible induction approximately 1 week postdates.

## 2021-07-10 ENCOUNTER — Observation Stay
Admission: EM | Admit: 2021-07-10 | Discharge: 2021-07-10 | Disposition: A | Discharge status: Home / Self Care | Payer: BC Managed Care – PPO | Attending: Obstetrics and Gynecology | Admitting: Obstetrics and Gynecology

## 2021-07-10 ENCOUNTER — Encounter: Payer: Self-pay | Admitting: Obstetrics and Gynecology

## 2021-07-10 ENCOUNTER — Other Ambulatory Visit: Payer: Self-pay

## 2021-07-10 DIAGNOSIS — O471 False labor at or after 37 completed weeks of gestation: Secondary | ICD-10-CM | POA: Diagnosis not present

## 2021-07-10 DIAGNOSIS — Z3A39 39 weeks gestation of pregnancy: Secondary | ICD-10-CM | POA: Insufficient documentation

## 2021-07-10 NOTE — Progress Notes (Signed)
Discharged home. Labor precautions reviewed. Left floor ambulatory with fiance'. Elaina Hoops

## 2021-07-10 NOTE — Progress Notes (Signed)
Changed OBS rooms from OBS4 to OBS3 due to difficulty with fetal monitor. Monitor halving FHR. RN was holding monitor on patient's abdomen hearing an audible FHR in the 120s with monitor showing FHR as 60 BPM. Cesar Alf, [place] Bene

## 2021-07-10 NOTE — Final Progress Note (Signed)
L&D OB Triage Note  Sandra Wilkerson is a 25 y.o. G1P0000 female at [redacted]w[redacted]d, EDD Estimated Date of Delivery: 07/13/21 who presented to triage for complaints of contractions, q 3-5 minutes apart since 0345 this morning, pain is between 5-9/10.  Denies recent intercourse, notes good fetal movement  and denies leakage of fluids. She was evaluated by the nurses with no findings significant for abnormal fetal heart tracing. Vital signs stable. An NST was performed and has been reviewed by MD. She was treated with PO hydration.    Physical Exam:  Blood pressure 130/84, pulse 91, temperature 98 F (36.7 C), temperature source Oral, resp. rate 18, height 4\' 11"  (1.499 m), weight 88 kg, last menstrual period 10/06/2020. Dilation: Fingertip Effacement (%): Thick Cervical Position: Middle, Anterior Station: Ballotable Presentation: Vertex Exam by:: LSE  NST INTERPRETATION: Indications: rule out uterine contractions  Mode: External Baseline Rate (A):  (unable to determine baseline) Variability: Moderate Accelerations: 15 x 15 Decelerations: Variable     Contraction Frequency (min): 4-5  Impression: reactive   Plan: Extended monitoring performed, was reviewed and was found to be reactive. No further cervical change noted. She was discharged home with bleeding/labor precautions.  Continue routine prenatal care. Follow up with OB/GYN as previously scheduled.     [phone], MD [facility]

## 2021-07-10 NOTE — OB Triage Note (Signed)
Pt is a G1P0 and [redacted]w[redacted]d presenting to L&D with c/o ctx that started at 0345. Pt reports pain 5/10-9/10 and states it cramping in her lower abdomen. Pt denies recent intercourse, LOF or VB and confirms positive fetal movement. VSS. Monitors applied and assessing. Cherry MD made aware of pts arrival.

## 2021-07-12 ENCOUNTER — Other Ambulatory Visit: Payer: Self-pay

## 2021-07-12 ENCOUNTER — Encounter: Payer: Self-pay | Admitting: Obstetrics and Gynecology

## 2021-07-12 ENCOUNTER — Observation Stay (HOSPITAL_BASED_OUTPATIENT_CLINIC_OR_DEPARTMENT_OTHER)
Admission: EM | Admit: 2021-07-12 | Discharge: 2021-07-12 | Disposition: A | Discharge status: Home / Self Care | Payer: BC Managed Care – PPO | Source: Home / Self Care | Admitting: Obstetrics and Gynecology

## 2021-07-12 DIAGNOSIS — O99824 Streptococcus B carrier state complicating childbirth: Secondary | ICD-10-CM | POA: Diagnosis not present

## 2021-07-12 DIAGNOSIS — Z3A39 39 weeks gestation of pregnancy: Secondary | ICD-10-CM | POA: Diagnosis not present

## 2021-07-12 DIAGNOSIS — O98813 Other maternal infectious and parasitic diseases complicating pregnancy, third trimester: Secondary | ICD-10-CM | POA: Diagnosis not present

## 2021-07-12 DIAGNOSIS — G8918 Other acute postprocedural pain: Secondary | ICD-10-CM | POA: Diagnosis not present

## 2021-07-12 DIAGNOSIS — O480 Post-term pregnancy: Secondary | ICD-10-CM | POA: Diagnosis not present

## 2021-07-12 DIAGNOSIS — O339 Maternal care for disproportion, unspecified: Secondary | ICD-10-CM | POA: Diagnosis not present

## 2021-07-12 DIAGNOSIS — Z3490 Encounter for supervision of normal pregnancy, unspecified, unspecified trimester: Secondary | ICD-10-CM

## 2021-07-12 DIAGNOSIS — Z3A40 40 weeks gestation of pregnancy: Secondary | ICD-10-CM | POA: Diagnosis not present

## 2021-07-12 DIAGNOSIS — O9952 Diseases of the respiratory system complicating childbirth: Secondary | ICD-10-CM | POA: Diagnosis not present

## 2021-07-12 DIAGNOSIS — Z23 Encounter for immunization: Secondary | ICD-10-CM | POA: Diagnosis not present

## 2021-07-12 DIAGNOSIS — O2693 Pregnancy related conditions, unspecified, third trimester: Secondary | ICD-10-CM

## 2021-07-12 DIAGNOSIS — J45909 Unspecified asthma, uncomplicated: Secondary | ICD-10-CM | POA: Diagnosis not present

## 2021-07-12 DIAGNOSIS — Z3483 Encounter for supervision of other normal pregnancy, third trimester: Secondary | ICD-10-CM | POA: Diagnosis not present

## 2021-07-12 DIAGNOSIS — B951 Streptococcus, group B, as the cause of diseases classified elsewhere: Secondary | ICD-10-CM | POA: Diagnosis not present

## 2021-07-12 DIAGNOSIS — O26893 Other specified pregnancy related conditions, third trimester: Secondary | ICD-10-CM | POA: Insufficient documentation

## 2021-07-12 DIAGNOSIS — O99214 Obesity complicating childbirth: Secondary | ICD-10-CM | POA: Diagnosis not present

## 2021-07-12 DIAGNOSIS — R1084 Generalized abdominal pain: Secondary | ICD-10-CM | POA: Diagnosis not present

## 2021-07-12 DIAGNOSIS — K5010 Crohn's disease of large intestine without complications: Secondary | ICD-10-CM | POA: Diagnosis not present

## 2021-07-12 DIAGNOSIS — R109 Unspecified abdominal pain: Secondary | ICD-10-CM | POA: Diagnosis not present

## 2021-07-12 DIAGNOSIS — Z20822 Contact with and (suspected) exposure to covid-19: Secondary | ICD-10-CM | POA: Diagnosis not present

## 2021-07-12 DIAGNOSIS — O9962 Diseases of the digestive system complicating childbirth: Secondary | ICD-10-CM | POA: Diagnosis not present

## 2021-07-12 DIAGNOSIS — Z79899 Other long term (current) drug therapy: Secondary | ICD-10-CM | POA: Diagnosis not present

## 2021-07-12 DIAGNOSIS — E282 Polycystic ovarian syndrome: Secondary | ICD-10-CM | POA: Diagnosis not present

## 2021-07-12 DIAGNOSIS — D509 Iron deficiency anemia, unspecified: Secondary | ICD-10-CM | POA: Diagnosis not present

## 2021-07-12 DIAGNOSIS — E669 Obesity, unspecified: Secondary | ICD-10-CM | POA: Diagnosis not present

## 2021-07-12 DIAGNOSIS — O9902 Anemia complicating childbirth: Secondary | ICD-10-CM | POA: Diagnosis not present

## 2021-07-12 NOTE — OB Triage Note (Signed)
Pt presented to L/D triage with reported contractions that began at 0830 last night. Pt reports pain is intermittent, varying from mild to moderated and at times 10/10. Pt reports no bleeding or LOF and positive fetal movement. Pt reports no urinary pain or changes in discharge. Pt past intercourse was last night.  Monitors applied and assessing- VSS.

## 2021-07-12 NOTE — Discharge Summary (Signed)
    L&D OB Triage Note  SUBJECTIVE Sandra Wilkerson is a 26 y.o. G1P0000 female at [redacted]w[redacted]d, EDD Estimated Date of Delivery: 07/13/21 who presented to triage with complaints of abdominal pains consistent with contractions.  Reports normal fetal movement.  Denies leakage of fluid  OB History  Gravida Para Term Preterm AB Living  1 0 0 0 0 0  SAB IAB Ectopic Multiple Live Births  0 0 0 0 0    # Outcome Date GA Lbr Len/2nd Weight Sex Delivery Anes PTL Lv  1 Current             No medications prior to admission.     OBJECTIVE  Nursing Evaluation:   BP 125/77 (BP Location: Right Arm)   Pulse 87   Temp 97.6 F (36.4 C) (Oral)   Resp 20   Ht 4\' 11"  (1.499 m)   Wt 88 kg   LMP 10/06/2020   BMI 39.18 kg/m    Findings:        Patient possibly in latent labor -no cervical change after 2 hours.      NST was performed and has been reviewed by me.  NST INTERPRETATION: Category I  Mode: External Baseline Rate (A): 115 bpm Variability: Moderate, Marked Accelerations: 15 x 15 Decelerations:  (none noted)     Contraction Frequency (min): 3-9, UI  ASSESSMENT Impression:  1.  Pregnancy:  G1P0000 at [redacted]w[redacted]d , EDD Estimated Date of Delivery: 07/13/21 2.  Reassuring fetal and maternal status   PLAN 1. Current condition and above findings reviewed.  Reassuring fetal and maternal condition. 2. Discharge home with standard labor precautions given to return to L&D or call the office for problems. 3. Continue routine prenatal care.

## 2021-07-12 NOTE — Progress Notes (Signed)
at bedside-noted HR sounded intermittently irregular with no audible decels. MD reviewed strip. RN given verbal order for pt to be discharged home. Pt scheduled for appt in OB office tomorrow. Labor precautions given and reviewed-pt has no questions or concerns at this time.  Pt discharged home in stable condition.

## 2021-07-13 ENCOUNTER — Encounter: Payer: Self-pay | Admitting: Obstetrics and Gynecology

## 2021-07-13 ENCOUNTER — Ambulatory Visit (INDEPENDENT_AMBULATORY_CARE_PROVIDER_SITE_OTHER): Payer: BC Managed Care – PPO

## 2021-07-13 ENCOUNTER — Ambulatory Visit (INDEPENDENT_AMBULATORY_CARE_PROVIDER_SITE_OTHER): Payer: BC Managed Care – PPO | Admitting: Obstetrics and Gynecology

## 2021-07-13 VITALS — BP 126/83 | HR 97 | Wt 193.9 lb

## 2021-07-13 DIAGNOSIS — Z3A39 39 weeks gestation of pregnancy: Secondary | ICD-10-CM

## 2021-07-13 DIAGNOSIS — Z3A40 40 weeks gestation of pregnancy: Secondary | ICD-10-CM | POA: Diagnosis not present

## 2021-07-13 DIAGNOSIS — O480 Post-term pregnancy: Secondary | ICD-10-CM

## 2021-07-13 DIAGNOSIS — Z3483 Encounter for supervision of other normal pregnancy, third trimester: Secondary | ICD-10-CM | POA: Diagnosis not present

## 2021-07-13 DIAGNOSIS — Z3403 Encounter for supervision of normal first pregnancy, third trimester: Secondary | ICD-10-CM

## 2021-07-13 LAB — POCT URINALYSIS DIPSTICK OB
Bilirubin, UA: NEGATIVE
Blood, UA: NEGATIVE
Glucose, UA: NEGATIVE
Ketones, UA: NEGATIVE
Leukocytes, UA: NEGATIVE
Nitrite, UA: NEGATIVE
POC,PROTEIN,UA: NEGATIVE
Spec Grav, UA: 1.01 (ref 1.010–1.025)
Urobilinogen, UA: 0.2 E.U./dL
pH, UA: 6.5 (ref 5.0–8.0)

## 2021-07-13 NOTE — Progress Notes (Signed)
ROB: Patient notes irregular contractions, pelvic pressure.  She has been seen in triage twice for contractions earlier this week. Reiterated labor precautions. Scheduled for [place] today for AFI check/BPP. Scheduled for IOL on 01/16/2021.

## 2021-07-13 NOTE — Progress Notes (Signed)
OB-present for routine prenatal care. Pt stated having a lot of pain, pressure and contractions.

## 2021-07-13 NOTE — Patient Instructions (Signed)
COVID 19 Instructions for Scheduled Procedure (Inductions/C-sections and GYN surgeries)   Thank you for choosing [facility] for your services.  You have been scheduled for a procedure called _______Induction of Labor_______________.    Your procedure is scheduled on ___Monday, July 18, 2021 at 5:00 A.M._____ [place] are required to have COVID-19 testing performed 2 days prior to your scheduled procedure date.  Testing is performed between 9 AM and 1 PM Monday through Friday.  Please present for testing on __Friday, Octber 7, 2022____ during this hour. Testing is performed in [facility] (this is next to the [facility]).    Upon your scheduled procedure date, you will need to arrive at the [facility] entrance. (There is a statue at the front of this entrance.)   Please arrive on time if you are scheduled for an induction of labor.   If you are scheduled for a Cesarean delivery or for Gyn Surgery, arrive 2 hours prior to your procedure time.   If you are an Obstetric patient and your arrival time falls between 11 PM and 6 AM call L&D [phone]) when you arrive.  A staff member will meet you at the [facility] entrance.  At this time, patients are allowed 1 support person to accompany them. Face masks are required for you and your support person. Your support person is now allowed to be there with you during the entire time of your admission.   Please contact the office if you have any questions regarding this information.  The [facility] office number is [phone].     Thank you,    Your [facility] Providers      Labor Induction Labor induction is when steps are taken to cause a pregnant woman to begin the labor process. Most women go into labor on their own between 37 weeks and 42 weeks of pregnancy. When this does not happen, or when there is a medical need for labor to begin, steps may be taken to induce, or bring on, labor. Labor induction  causes a pregnant woman's uterus to contract. It also causes the cervix to soften (ripen), open (dilate), and thin out. Usually, labor is not induced before 39 weeks of pregnancy unless there is a medical reason to do so. When is labor induction considered? Labor induction may be right for you if: Your pregnancy lasts longer than 41 to 42 weeks. Your placenta is separating from your uterus (placental abruption). You have a rupture of membranes and your labor does not begin. You have health problems, like diabetes or high blood pressure (preeclampsia) during your pregnancy. Your baby has stopped growing or does not have enough amniotic fluid. Before labor induction begins, your health care provider will consider the following factors: Your medical condition and the baby's condition. How many weeks you have been pregnant. How mature the baby's lungs are. The condition of your cervix. The position of the baby. The size of your birth canal. Tell a health care provider about: Any allergies you have. All medicines you are taking, including vitamins, herbs, eye drops, creams, and over-the-counter medicines. Any problems you or your family members have had with anesthetic medicines. Any surgeries you have had. Any blood disorders you have. Any medical conditions you have. What are the risks? Generally, this is a safe procedure. However, problems may occur, including: Failed induction. Changes in fetal heart rate, such as being too high, too low, or irregular (erratic). Infection in the mother or  the baby. Increased risk of having a cesarean delivery. Breaking off (abruption) of the placenta from the uterus. This is rare. Rupture of the uterus. This is very rare. Your baby could fail to get enough blood flow or oxygen. This can be life-threatening. When induction is needed for medical reasons, the benefits generally outweigh the risks. What happens during the procedure? During the procedure,  your health care provider will use one of these methods to induce labor: Stripping the membranes. In this method, the amniotic sac tissue is gently separated from the cervix. This causes the following to happen: Your cervix stretches, which in turn causes the release of prostaglandins. Prostaglandins induce labor and cause the uterus to contract. This procedure is often done in an office visit. You will be sent home to wait for contractions to begin. Prostaglandin medicine. This medicine starts contractions and causes the cervix to dilate and ripen. This can be taken by mouth (orally) or by being inserted into the vagina (suppository). Inserting a small, thin tube (catheter) with a balloon into the vagina and then expanding the balloon with water to dilate the cervix. Breaking the water. In this method, a small instrument is used to make a small hole in the amniotic sac. This eventually causes the amniotic sac to break. Contractions should begin within a few hours. Medicine to trigger or strengthen contractions. This medicine is given through an IV that is inserted into a vein in your arm. This procedure may vary among health care providers and hospitals. Where to find more information [employer]: www.marchofdimes.org [employer]: www.acog.org Summary Labor induction causes a pregnant woman's uterus to contract. It also causes the cervix to soften (ripen), open (dilate), and thin out. Labor is usually not induced before 39 weeks of pregnancy unless there is a medical reason to do so. When induction is needed for medical reasons, the benefits generally outweigh the risks. Talk with your health care provider about which methods of labor induction are right for you. This information is not intended to replace advice given to you by your health care provider. Make sure you discuss any questions you have with your health care provider. Document Revised:  07/08/2020 Document Reviewed: 07/08/2020 Elsevier Patient Education  2022 [employer].

## 2021-07-15 ENCOUNTER — Encounter
Admission: EM | Disposition: A | Discharge status: Home / Self Care | Payer: Self-pay | Source: Home / Self Care | Attending: Obstetrics and Gynecology

## 2021-07-15 ENCOUNTER — Inpatient Hospital Stay
Admission: EM | Admit: 2021-07-15 | Discharge: 2021-07-15 | DRG: 787 | Disposition: A | Discharge status: Home / Self Care | Payer: BC Managed Care – PPO | Attending: Obstetrics and Gynecology | Admitting: Obstetrics and Gynecology

## 2021-07-15 ENCOUNTER — Inpatient Hospital Stay: Payer: BC Managed Care – PPO | Admitting: Anesthesiology

## 2021-07-15 ENCOUNTER — Encounter: Payer: Self-pay | Admitting: Obstetrics and Gynecology

## 2021-07-15 ENCOUNTER — Inpatient Hospital Stay (HOSPITAL_COMMUNITY)
Admission: EM | Admit: 2021-07-15 | Discharge: 2021-07-18 | Disposition: A | Discharge status: Home / Self Care | Payer: BC Managed Care – PPO | Source: Home / Self Care | Attending: Obstetrics and Gynecology | Admitting: Obstetrics and Gynecology

## 2021-07-15 ENCOUNTER — Other Ambulatory Visit: Payer: Self-pay

## 2021-07-15 DIAGNOSIS — K5010 Crohn's disease of large intestine without complications: Secondary | ICD-10-CM | POA: Diagnosis present

## 2021-07-15 DIAGNOSIS — O339 Maternal care for disproportion, unspecified: Secondary | ICD-10-CM | POA: Diagnosis present

## 2021-07-15 DIAGNOSIS — O99830 Other infection carrier state complicating pregnancy: Secondary | ICD-10-CM

## 2021-07-15 DIAGNOSIS — E282 Polycystic ovarian syndrome: Secondary | ICD-10-CM | POA: Diagnosis present

## 2021-07-15 DIAGNOSIS — O99824 Streptococcus B carrier state complicating childbirth: Secondary | ICD-10-CM | POA: Diagnosis present

## 2021-07-15 DIAGNOSIS — J45909 Unspecified asthma, uncomplicated: Secondary | ICD-10-CM | POA: Diagnosis present

## 2021-07-15 DIAGNOSIS — O480 Post-term pregnancy: Secondary | ICD-10-CM | POA: Diagnosis present

## 2021-07-15 DIAGNOSIS — D509 Iron deficiency anemia, unspecified: Secondary | ICD-10-CM

## 2021-07-15 DIAGNOSIS — E669 Obesity, unspecified: Secondary | ICD-10-CM

## 2021-07-15 DIAGNOSIS — O98813 Other maternal infectious and parasitic diseases complicating pregnancy, third trimester: Secondary | ICD-10-CM | POA: Diagnosis not present

## 2021-07-15 DIAGNOSIS — Z79899 Other long term (current) drug therapy: Secondary | ICD-10-CM

## 2021-07-15 DIAGNOSIS — Z3A40 40 weeks gestation of pregnancy: Secondary | ICD-10-CM

## 2021-07-15 DIAGNOSIS — O9952 Diseases of the respiratory system complicating childbirth: Secondary | ICD-10-CM | POA: Diagnosis present

## 2021-07-15 DIAGNOSIS — O9962 Diseases of the digestive system complicating childbirth: Secondary | ICD-10-CM | POA: Diagnosis present

## 2021-07-15 DIAGNOSIS — O99214 Obesity complicating childbirth: Secondary | ICD-10-CM | POA: Diagnosis not present

## 2021-07-15 DIAGNOSIS — B951 Streptococcus, group B, as the cause of diseases classified elsewhere: Secondary | ICD-10-CM

## 2021-07-15 DIAGNOSIS — O99213 Obesity complicating pregnancy, third trimester: Secondary | ICD-10-CM

## 2021-07-15 DIAGNOSIS — O479 False labor, unspecified: Secondary | ICD-10-CM

## 2021-07-15 DIAGNOSIS — O9902 Anemia complicating childbirth: Secondary | ICD-10-CM | POA: Diagnosis not present

## 2021-07-15 DIAGNOSIS — Z20822 Contact with and (suspected) exposure to covid-19: Secondary | ICD-10-CM | POA: Diagnosis present

## 2021-07-15 LAB — CBC
HCT: 32.7 % — ABNORMAL LOW (ref 36.0–46.0)
HCT: 33.7 % — ABNORMAL LOW (ref 36.0–46.0)
Hemoglobin: 10.2 g/dL — ABNORMAL LOW (ref 12.0–15.0)
Hemoglobin: 10.7 g/dL — ABNORMAL LOW (ref 12.0–15.0)
MCH: 22.2 pg — ABNORMAL LOW (ref 26.0–34.0)
MCH: 23.1 pg — ABNORMAL LOW (ref 26.0–34.0)
MCHC: 31.2 g/dL (ref 30.0–36.0)
MCHC: 31.8 g/dL (ref 30.0–36.0)
MCV: 71.1 fL — ABNORMAL LOW (ref 80.0–100.0)
MCV: 72.8 fL — ABNORMAL LOW (ref 80.0–100.0)
Platelets: 357 10*3/uL (ref 150–400)
Platelets: 334 10*3/uL (ref 150–400)
RBC: 4.6 MIL/uL (ref 3.87–5.11)
RBC: 4.63 MIL/uL (ref 3.87–5.11)
RDW: 15.3 % (ref 11.5–15.5)
RDW: 15.4 % (ref 11.5–15.5)
WBC: 12.6 10*3/uL — ABNORMAL HIGH (ref 4.0–10.5)
WBC: 12.6 10*3/uL — ABNORMAL HIGH (ref 4.0–10.5)
nRBC: 0 % (ref 0.0–0.2)
nRBC: 0 % (ref 0.0–0.2)

## 2021-07-15 LAB — RESP PANEL BY RT-PCR (FLU A&B, COVID) ARPGX2
Influenza A by PCR: NEGATIVE
Influenza B by PCR: NEGATIVE
SARS Coronavirus 2 by RT PCR: NEGATIVE

## 2021-07-15 LAB — TYPE AND SCREEN
ABO/RH(D): O NEG
Antibody Screen: POSITIVE

## 2021-07-15 LAB — ABO/RH: ABO/RH(D): O NEG

## 2021-07-15 LAB — RPR: RPR Ser Ql: NONREACTIVE

## 2021-07-15 SURGERY — Surgical Case
Anesthesia: Epidural

## 2021-07-15 MED ORDER — LIDOCAINE HCL (PF) 1 % IJ SOLN: 30.0000 mL | INTRAMUSCULAR | Status: DC | PRN

## 2021-07-15 MED ORDER — SOD CITRATE-CITRIC ACID 500-334 MG/5ML PO SOLN: 30.0000 mL | ORAL | Status: DC | PRN

## 2021-07-15 MED ORDER — ONDANSETRON HCL 4 MG/2ML IJ SOLN
INTRAMUSCULAR | Status: DC | PRN
  Administered 2021-07-15: 4 mg via INTRAVENOUS

## 2021-07-15 MED ORDER — TERBUTALINE SULFATE 1 MG/ML IJ SOLN: 0.2500 mg | Freq: Once | INTRAMUSCULAR | Status: DC | PRN

## 2021-07-15 MED ORDER — LIDOCAINE 5 % EX PTCH
MEDICATED_PATCH | CUTANEOUS | Status: DC | PRN
  Administered 2021-07-15: 1 via TRANSDERMAL

## 2021-07-15 MED ORDER — FENTANYL CITRATE (PF) 100 MCG/2ML IJ SOLN
INTRAMUSCULAR | Status: DC | PRN
  Administered 2021-07-15: 100 ug via EPIDURAL

## 2021-07-15 MED ORDER — OXYCODONE-ACETAMINOPHEN 5-325 MG PO TABS
1.0000 | ORAL_TABLET | ORAL | Status: DC | PRN
Start: 2021-07-15 — End: 2021-07-15

## 2021-07-15 MED ORDER — LIDOCAINE HCL (PF) 1 % IJ SOLN
INTRAMUSCULAR | Status: AC
  Filled 2021-07-15: qty 30

## 2021-07-15 MED ORDER — PHENYLEPHRINE 40 MCG/ML (10ML) SYRINGE FOR IV PUSH (FOR BLOOD PRESSURE SUPPORT): 80.0000 ug | PREFILLED_SYRINGE | INTRAVENOUS | Status: DC | PRN

## 2021-07-15 MED ORDER — LIDOCAINE HCL (PF) 2 % IJ SOLN
INTRAMUSCULAR | Status: AC
  Filled 2021-07-15: qty 5

## 2021-07-15 MED ORDER — OXYTOCIN 10 UNIT/ML IJ SOLN
INTRAMUSCULAR | Status: AC
  Filled 2021-07-15: qty 2

## 2021-07-15 MED ORDER — OXYTOCIN-SODIUM CHLORIDE 30-0.9 UT/500ML-% IV SOLN
INTRAVENOUS | Status: DC | PRN
  Administered 2021-07-15: 250 mL/h via INTRAVENOUS

## 2021-07-15 MED ORDER — SOD CITRATE-CITRIC ACID 500-334 MG/5ML PO SOLN
30.0000 mL | ORAL | Status: DC | PRN
  Administered 2021-07-15: 30 mL via ORAL

## 2021-07-15 MED ORDER — SOD CITRATE-CITRIC ACID 500-334 MG/5ML PO SOLN
ORAL | Status: AC
  Filled 2021-07-15: qty 15

## 2021-07-15 MED ORDER — ONDANSETRON HCL 4 MG/2ML IJ SOLN: 4.0000 mg | Freq: Four times a day (QID) | INTRAMUSCULAR | Status: DC | PRN

## 2021-07-15 MED ORDER — LIDOCAINE HCL (PF) 1 % IJ SOLN
INTRAMUSCULAR | Status: DC | PRN
  Administered 2021-07-15 (×3): 2 mL

## 2021-07-15 MED ORDER — LACTATED RINGERS IV SOLN: INTRAVENOUS | Status: DC

## 2021-07-15 MED ORDER — LACTATED RINGERS IV SOLN: 500.0000 mL | INTRAVENOUS | Status: DC | PRN

## 2021-07-15 MED ORDER — LACTATED RINGERS IV SOLN
500.0000 mL | Freq: Once | INTRAVENOUS | Status: AC
  Administered 2021-07-15: 500 mL via INTRAVENOUS

## 2021-07-15 MED ORDER — SODIUM CHLORIDE 0.9 % IV SOLN
500.0000 mg | Freq: Once | INTRAVENOUS | Status: AC
  Administered 2021-07-15: 500 mg via INTRAVENOUS

## 2021-07-15 MED ORDER — SODIUM CHLORIDE 0.9 % IV SOLN: 1.0000 g | INTRAVENOUS | Status: DC

## 2021-07-15 MED ORDER — AMPICILLIN SODIUM 2 G IJ SOLR
2.0000 g | Freq: Once | INTRAMUSCULAR | Status: AC
Start: 2021-07-15 — End: 2021-07-15
  Administered 2021-07-15: 2 g via INTRAVENOUS
  Filled 2021-07-15: qty 2000

## 2021-07-15 MED ORDER — OXYTOCIN-SODIUM CHLORIDE 30-0.9 UT/500ML-% IV SOLN
INTRAVENOUS | Status: AC
  Administered 2021-07-15: 2 m[IU]/min via INTRAVENOUS
  Filled 2021-07-15: qty 500

## 2021-07-15 MED ORDER — FENTANYL-BUPIVACAINE-NACL 0.5-0.125-0.9 MG/250ML-% EP SOLN
EPIDURAL | Status: AC
  Filled 2021-07-15: qty 250

## 2021-07-15 MED ORDER — OXYCODONE-ACETAMINOPHEN 5-325 MG PO TABS
2.0000 | ORAL_TABLET | ORAL | Status: DC | PRN
  Administered 2021-07-15: 2 via ORAL
  Filled 2021-07-15: qty 2

## 2021-07-15 MED ORDER — MISOPROSTOL 50MCG HALF TABLET: 50.0000 ug | ORAL_TABLET | ORAL | Status: DC | PRN

## 2021-07-15 MED ORDER — LACTATED RINGERS IV BOLUS
1000.0000 mL | Freq: Once | INTRAVENOUS | Status: AC
  Administered 2021-07-15: 1000 mL via INTRAVENOUS

## 2021-07-15 MED ORDER — SODIUM CHLORIDE 0.9 % IV SOLN
1.0000 g | INTRAVENOUS | Status: DC
  Administered 2021-07-15 (×2): 1 g via INTRAVENOUS
  Filled 2021-07-15 (×2): qty 1000

## 2021-07-15 MED ORDER — FENTANYL CITRATE (PF) 100 MCG/2ML IJ SOLN
INTRAMUSCULAR | Status: AC
  Filled 2021-07-15: qty 2

## 2021-07-15 MED ORDER — ACETAMINOPHEN 325 MG PO TABS: 650.0000 mg | ORAL_TABLET | ORAL | Status: DC | PRN

## 2021-07-15 MED ORDER — AMMONIA AROMATIC IN INHA
RESPIRATORY_TRACT | Status: AC
  Filled 2021-07-15: qty 10

## 2021-07-15 MED ORDER — KETOROLAC TROMETHAMINE 30 MG/ML IJ SOLN
INTRAMUSCULAR | Status: DC | PRN
  Administered 2021-07-15: 30 mg via INTRAVENOUS

## 2021-07-15 MED ORDER — LIDOCAINE HCL (PF) 2 % IJ SOLN
INTRAMUSCULAR | Status: DC | PRN
  Administered 2021-07-15: 100 mg via EPIDURAL
  Administered 2021-07-15: 40 mg via EPIDURAL
  Administered 2021-07-15: 160 mg via EPIDURAL

## 2021-07-15 MED ORDER — DIPHENHYDRAMINE HCL 50 MG/ML IJ SOLN: 12.5000 mg | INTRAMUSCULAR | Status: DC | PRN

## 2021-07-15 MED ORDER — SODIUM CHLORIDE 0.9 % IV SOLN
INTRAVENOUS | Status: AC
  Filled 2021-07-15: qty 500

## 2021-07-15 MED ORDER — LACTATED RINGERS IV SOLN: 125.0000 mL/h | INTRAVENOUS | Status: DC

## 2021-07-15 MED ORDER — SODIUM CHLORIDE 0.9 % IV SOLN: 2.0000 g | Freq: Once | INTRAVENOUS | Status: DC

## 2021-07-15 MED ORDER — OXYTOCIN BOLUS FROM INFUSION: 333.0000 mL | Freq: Once | INTRAVENOUS | Status: DC

## 2021-07-15 MED ORDER — OXYCODONE-ACETAMINOPHEN 5-325 MG PO TABS: 2.0000 | ORAL_TABLET | ORAL | Status: DC | PRN

## 2021-07-15 MED ORDER — OXYTOCIN-SODIUM CHLORIDE 30-0.9 UT/500ML-% IV SOLN
1.0000 m[IU]/min | INTRAVENOUS | Status: DC
  Filled 2021-07-15: qty 500

## 2021-07-15 MED ORDER — KETOROLAC TROMETHAMINE 30 MG/ML IJ SOLN
INTRAMUSCULAR | Status: AC
  Filled 2021-07-15: qty 1

## 2021-07-15 MED ORDER — PHENYLEPHRINE HCL (PRESSORS) 10 MG/ML IV SOLN
INTRAVENOUS | Status: DC | PRN
  Administered 2021-07-15 (×3): 100 ug via INTRAVENOUS

## 2021-07-15 MED ORDER — OXYTOCIN BOLUS FROM INFUSION
333.0000 mL | Freq: Once | INTRAVENOUS | Status: DC
Start: 2021-07-15 — End: 2021-07-15

## 2021-07-15 MED ORDER — MISOPROSTOL 50MCG HALF TABLET
50.0000 ug | ORAL_TABLET | ORAL | Status: DC | PRN
  Administered 2021-07-15: 50 ug via ORAL
  Filled 2021-07-15: qty 1

## 2021-07-15 MED ORDER — OXYTOCIN-SODIUM CHLORIDE 30-0.9 UT/500ML-% IV SOLN: 2.5000 [IU]/h | INTRAVENOUS | Status: DC

## 2021-07-15 MED ORDER — FLEET ENEMA 7-19 GM/118ML RE ENEM: 1.0000 | ENEMA | RECTAL | Status: DC | PRN

## 2021-07-15 MED ORDER — OXYCODONE-ACETAMINOPHEN 5-325 MG PO TABS: 1.0000 | ORAL_TABLET | ORAL | Status: DC | PRN

## 2021-07-15 MED ORDER — LIDOCAINE 5 % EX PTCH
MEDICATED_PATCH | CUTANEOUS | Status: AC
  Filled 2021-07-15: qty 1

## 2021-07-15 MED ORDER — HYDROMORPHONE HCL 1 MG/ML IJ SOLN
INTRAMUSCULAR | Status: AC
  Filled 2021-07-15: qty 1

## 2021-07-15 MED ORDER — CEFAZOLIN SODIUM-DEXTROSE 2-4 GM/100ML-% IV SOLN
2.0000 g | Freq: Once | INTRAVENOUS | Status: AC
  Administered 2021-07-15: 2 g via INTRAVENOUS
  Filled 2021-07-15: qty 100

## 2021-07-15 MED ORDER — ONDANSETRON HCL 4 MG/2ML IJ SOLN
INTRAMUSCULAR | Status: AC
  Filled 2021-07-15: qty 2

## 2021-07-15 MED ORDER — BUPIVACAINE HCL (PF) 0.25 % IJ SOLN
INTRAMUSCULAR | Status: DC | PRN
  Administered 2021-07-15: 5 mL via EPIDURAL
  Administered 2021-07-15: 3 mL via EPIDURAL

## 2021-07-15 MED ORDER — FENTANYL-BUPIVACAINE-NACL 0.5-0.125-0.9 MG/250ML-% EP SOLN
12.0000 mL/h | EPIDURAL | Status: DC | PRN
Start: 2021-07-15 — End: 2021-07-15
  Administered 2021-07-15: 12 mL/h via EPIDURAL

## 2021-07-15 MED ORDER — BUTORPHANOL TARTRATE 1 MG/ML IJ SOLN
1.0000 mg | INTRAMUSCULAR | Status: DC | PRN
  Administered 2021-07-15: 1 mg via INTRAVENOUS
  Filled 2021-07-15: qty 1

## 2021-07-15 MED ORDER — EPHEDRINE 5 MG/ML INJ: 10.0000 mg | INTRAVENOUS | Status: DC | PRN

## 2021-07-15 MED ORDER — HYDROMORPHONE HCL 1 MG/ML IJ SOLN
INTRAMUSCULAR | Status: DC | PRN
  Administered 2021-07-15: .5 mg via INTRAVENOUS

## 2021-07-15 MED ORDER — MORPHINE SULFATE (PF) 10 MG/ML IV SOLN
10.0000 mg | Freq: Once | INTRAVENOUS | Status: AC
  Administered 2021-07-15: 10 mg via INTRAMUSCULAR
  Filled 2021-07-15: qty 1

## 2021-07-15 MED ORDER — MISOPROSTOL 200 MCG PO TABS
ORAL_TABLET | ORAL | Status: AC
  Filled 2021-07-15: qty 4

## 2021-07-15 MED ORDER — PROMETHAZINE HCL 25 MG/ML IJ SOLN
25.0000 mg | Freq: Once | INTRAMUSCULAR | Status: AC
  Administered 2021-07-15: 25 mg via INTRAMUSCULAR
  Filled 2021-07-15: qty 1

## 2021-07-15 SURGICAL SUPPLY — 27 items
ADHESIVE MASTISOL STRL (MISCELLANEOUS) ×2 IMPLANT
BAG COUNTER SPONGE SURGICOUNT (BAG) ×2 IMPLANT
CELL SAVER LIPIGURD (MISCELLANEOUS) ×1 IMPLANT
CHLORAPREP W/TINT 26 (MISCELLANEOUS) ×4 IMPLANT
DRSG TELFA 3X8 NADH (GAUZE/BANDAGES/DRESSINGS) ×2 IMPLANT
EXTRT SYSTEM ALEXIS 14CM (MISCELLANEOUS) ×2
GAUZE SPONGE 4X4 12PLY STRL (GAUZE/BANDAGES/DRESSINGS) ×2 IMPLANT
GLOVE SURG POLY ORTHO LF SZ7.5 (GLOVE) ×2 IMPLANT
GOWN STRL REUS W/ TWL LRG LVL3 (GOWN DISPOSABLE) ×2 IMPLANT
GOWN STRL REUS W/TWL LRG LVL3 (GOWN DISPOSABLE) ×2
KIT TURNOVER KIT A (KITS) ×2 IMPLANT
MANIFOLD NEPTUNE II (INSTRUMENTS) ×2 IMPLANT
MAT PREVALON FULL STRYKER (MISCELLANEOUS) ×2 IMPLANT
NS IRRIG 1000ML POUR BTL (IV SOLUTION) ×2 IMPLANT
PACK C SECTION AR (MISCELLANEOUS) ×2 IMPLANT
PAD OB MATERNITY 4.3X12.25 (PERSONAL CARE ITEMS) ×2 IMPLANT
PAD PREP 24X41 OB/GYN DISP (PERSONAL CARE ITEMS) ×2 IMPLANT
PENCIL SMOKE EVACUATOR (MISCELLANEOUS) ×2 IMPLANT
RETRACTOR WND ALEXIS-O 25 LRG (MISCELLANEOUS) ×1 IMPLANT
RTRCTR WOUND ALEXIS O 25CM LRG (MISCELLANEOUS) ×2
SCRUB EXIDINE 4% CHG 4OZ (MISCELLANEOUS) ×2 IMPLANT
SPONGE T-LAP 18X18 ~~LOC~~+RFID (SPONGE) ×2 IMPLANT
SUT VIC AB 0 CTX 36 (SUTURE) ×2
SUT VIC AB 0 CTX36XBRD ANBCTRL (SUTURE) ×2 IMPLANT
SUT VIC AB 1 CT1 36 (SUTURE) ×4 IMPLANT
SUT VICRYL+ 3-0 36IN CT-1 (SUTURE) ×4 IMPLANT
WATER STERILE IRR 500ML POUR (IV SOLUTION) ×2 IMPLANT

## 2021-07-15 NOTE — OB Triage Note (Signed)
Sandra Wilkerson is a 25yo G1P0, 40w 2d. She arrived to the unit with complaints of  contractions every 3-60mins. She denies vaginal bleeding, reports positive fetal movement. VS stable, monitors applied and assessing.   Initial FHT 110 at 0135

## 2021-07-15 NOTE — Anesthesia Preprocedure Evaluation (Addendum)
Anesthesia Evaluation  Patient identified by MRN, date of birth, ID band Patient awake    Reviewed: Allergy & Precautions, H&P , NPO status   History of Anesthesia Complications Negative for: history of anesthetic complications  Airway Mallampati: II       Dental  (+) Dental Advidsory Given, Teeth Intact, Chipped   Pulmonary neg shortness of breath, asthma , neg recent URI,           Cardiovascular (-) hypertensionnegative cardio ROS       Neuro/Psych  Headaches,    GI/Hepatic Neg liver ROS, GERD  ,  Endo/Other  negative endocrine ROS  Renal/GU negative Renal ROS     Musculoskeletal   Abdominal   Peds  Hematology negative hematology ROS (+)   Anesthesia Other Findings Past Medical History: No date: Crohn disease (HCC)   Reproductive/Obstetrics (+) Pregnancy                            Anesthesia Physical Anesthesia Plan  ASA: 3  Anesthesia Plan: Epidural   Post-op Pain Management:    Induction:   PONV Risk Score and Plan:   Airway Management Planned: Natural Airway and Simple Face Mask  Additional Equipment:   Intra-op Plan:   Post-operative Plan:   Informed Consent:   Plan Discussed with: CRNA  Anesthesia Plan Comments:        Anesthesia Quick Evaluation

## 2021-07-15 NOTE — H&P (Signed)
History and Physical   HPI  Sandra Wilkerson is a 25 y.o. G1P0000 at [redacted]w[redacted]d Estimated Date of Delivery: 07/13/21 who was seen in L&D early this morning in triage and showed no cervical and went home.  She then had SROM while at home 0630 and came back to L&D.  Throughout the day we augmented her contractions with both misoprostol and Pitocin.  She has shown minimal cervical change.    She is comfortable with her epidural.   OB History  OB History  Gravida Para Term Preterm AB Living  1 0 0 0 0 0  SAB IAB Ectopic Multiple Live Births  0 0 0 0 0    # Outcome Date GA Lbr Len/2nd Weight Sex Delivery Anes PTL Lv  1 Current             PROBLEM LIST  Pregnancy complications or risks: Patient Active Problem List   Diagnosis Date Noted   Post-dates pregnancy 07/15/2021   Uterine contractions during pregnancy    Pregnancy 07/12/2021   Group B streptococcal carriage complicating pregnancy 06/29/2021   Indication for care in labor or delivery 05/16/2021   Abdominal pregnancy with intrauterine pregnancy 01/06/2021   Subchorionic hematoma in first trimester 12/29/2020   History of infertility 12/29/2020   Obesity (BMI 30-39.9) 12/29/2020   PCO (polycystic ovaries) 12/29/2020   Iron deficiency 03/13/2018   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   Crohn's colitis (HCC) 09/12/2017   Other headache syndrome 03/26/2017   Asthma 09/30/2014   Keloid scar 09/19/2011   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   Peritoneal abscess (HCC) 03/13/2010    Prenatal labs and studies: ABO, Rh: --/--/O NEG (10/07 0802) Antibody: POS (10/07 0802) Rubella: 14.40 (03/15 1417) RPR: NON REACTIVE (10/07 0209)  HBsAg: Negative (03/15 1417)  HIV: Non Reactive (03/15 1417)  XLK:GMWNUUVO/-- (09/13 1352)   Past Medical History:  Diagnosis Date   Crohn disease (HCC)      Past Surgical History:  Procedure Laterality Date   APPENDECTOMY     COLECTOMY     COLOSTOMY     COLOSTOMY  REVERSAL       Medications    Current Discharge Medication List     CONTINUE these medications which have NOT CHANGED   Details  inFLIXimab (REMICADE) 100 MG injection Inject into the vein.   Associated Diagnoses: Encounter for supervision of normal first pregnancy in first trimester    Prenat-FeAsp-Meth-FA-DHA w/o A (PRENATE PIXIE) 10-0.6-0.4-200 MG CAPS Take 1 tablet by mouth daily. Qty: 30 capsule, Refills: 11   Associated Diagnoses: Encounter for supervision of normal first pregnancy in first trimester         Allergies  Patient has no known allergies.  Review of Systems  Pertinent items noted in HPI and remainder of comprehensive ROS otherwise negative.  Physical Exam  BP (!) 109/56   Pulse 99   Temp 99.8 F (37.7 C) (Oral)   Resp 16   Wt 87.5 kg   LMP 10/06/2020   SpO2 100%   BMI 38.98 kg/m   Lungs:  CTA B Cardio: RRR without M/R/G Abd: Soft, gravid, NT Presentation: cephalic    Caput molding present EXT: No C/C/ 1+ Edema DTRs: 2+ B CERVIX: Dilation: 3.5 (swelling and caput noted) Effacement (%): 90 Cervical Position: Anterior Station: -1 Presentation: Vertex Exam by:: LSE  See Prenatal records for more detailed PE.   FHR:  Variability: Fair (1-6 bpm)  Test Results  Results for  orders placed or performed during the hospital encounter of 07/15/21 (from the past 24 hour(s))  Resp Panel by RT-PCR (Flu A&B, Covid) Nasopharyngeal Swab     Status: None   Collection Time: 07/15/21  7:39 AM   Specimen: Nasopharyngeal Swab; Nasopharyngeal(NP) swabs in vial transport medium  Result Value Ref Range   SARS Coronavirus 2 by RT PCR NEGATIVE NEGATIVE   Influenza A by PCR NEGATIVE NEGATIVE   Influenza B by PCR NEGATIVE NEGATIVE  CBC     Status: Abnormal   Collection Time: 07/15/21  8:02 AM  Result Value Ref Range   WBC 12.6 (H) 4.0 - 10.5 K/uL   RBC 4.63 3.87 - 5.11 MIL/uL   Hemoglobin 10.7 (L) 12.0 - 15.0 g/dL   HCT 48.1 (L) 85.6 - 31.4 %   MCV  72.8 (L) 80.0 - 100.0 fL   MCH 23.1 (L) 26.0 - 34.0 pg   MCHC 31.8 30.0 - 36.0 g/dL   RDW 97.0 26.3 - 78.5 %   Platelets 334 150 - 400 K/uL   nRBC 0.0 0.0 - 0.2 %  Type and screen [facility]     Status: None   Collection Time: 07/15/21  8:02 AM  Result Value Ref Range   ABO/RH(D) O NEG    Antibody Screen POS    Sample Expiration 07/18/2021,2359    Antibody Identification      PASSIVELY ACQUIRED ANTI-D Performed at [facility], [address] [place], [place] [address]      Assessment   G1P0000 at [redacted]w[redacted]d Estimated Date of Delivery: 07/13/21  The fetus is reassuring.  Minimal cervical change despite adequate contractions Maternal low grade fever Fetal head high - caput/molding  Patient Active Problem List   Diagnosis Date Noted   Post-dates pregnancy 07/15/2021   Uterine contractions during pregnancy    Pregnancy 07/12/2021   Group B streptococcal carriage complicating pregnancy 06/29/2021   Indication for care in labor or delivery 05/16/2021   Abdominal pregnancy with intrauterine pregnancy 01/06/2021   Subchorionic hematoma in first trimester 12/29/2020   History of infertility 12/29/2020   Obesity (BMI 30-39.9) 12/29/2020   PCO (polycystic ovaries) 12/29/2020   Iron deficiency 03/13/2018   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   Crohn's colitis (HCC) 09/12/2017   Other headache syndrome 03/26/2017   Asthma 09/30/2014   Keloid scar 09/19/2011   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 Discussed CD in detail with patient and family - all questions answered.  Rationale for CD discusssed.   Primary CD   Elonda Husky, M.D. 07/15/2021 8:16 PM

## 2021-07-15 NOTE — Anesthesia Procedure Notes (Signed)
Epidural Patient location during procedure: OB Start time: 07/15/2021 1:33 PM End time: 07/15/2021 1:45 PM  Staffing Anesthesiologist: Piscitello, Cleda Mccreedy, MD Resident/CRNA: Lynden Oxford, CRNA Performed: resident/CRNA   Preanesthetic Checklist Completed: patient identified, IV checked, site marked, risks and benefits discussed, monitors and equipment checked, pre-op evaluation and timeout performed  Epidural Patient position: sitting Prep: Betadine Patient monitoring: heart rate, continuous pulse ox and blood pressure Approach: midline Location: L4-L5 Injection technique: LOR saline  Needle:  Needle type: Tuohy  Needle gauge: 18 G Needle length: 9 cm and 9 Needle insertion depth: 5.5 cm Catheter type: closed end flexible Catheter size: 20 Guage Test dose: negative and Other (0.25% bupivicaine)  Assessment Events: blood not aspirated, injection not painful, no injection resistance, no paresthesia and negative IV test  Additional Notes   Patient tolerated the insertion well without complications.Reason for block:procedure for pain

## 2021-07-15 NOTE — Final Progress Note (Signed)
L&D OB Triage Note  HPI:  Sandra Wilkerson is a 25 y.o. G1P0000 female at [redacted]w[redacted]d. Estimated Date of Delivery: 07/13/21 who presents to triage for complaints of regular contractions, q 2-3 minutes apart, 9/10 in intensity.  Of note, patient has been to triage several times this week for labor evaluation. She denies vaginal bleeding, rupture of membranes, and endorses good fetal movement.    OB History  Gravida Para Term Preterm AB Living  1 0 0 0 0 0  SAB IAB Ectopic Multiple Live Births  0 0 0 0 0    # Outcome Date GA Lbr Len/2nd Weight Sex Delivery Anes PTL Lv  1 Current             Patient Active Problem List   Diagnosis Date Noted   Post-dates pregnancy 07/15/2021   Pregnancy 07/12/2021   Group B streptococcal carriage complicating pregnancy 06/29/2021   Indication for care in labor or delivery 05/16/2021   Abdominal pregnancy with intrauterine pregnancy 01/06/2021   Subchorionic hematoma in first trimester 12/29/2020   History of infertility 12/29/2020   Obesity (BMI 30-39.9) 12/29/2020   PCO (polycystic ovaries) 12/29/2020   Iron deficiency 03/13/2018   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   Crohn's colitis (HCC) 09/12/2017   Other headache syndrome 03/26/2017   Asthma 09/30/2014   Keloid scar 09/19/2011   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   Current Outpatient Medications on File Prior to Encounter  Medication Sig Dispense Refill   inFLIXimab (REMICADE) 100 MG injection Inject into the vein.     Prenat-FeAsp-Meth-FA-DHA w/o A (PRENATE PIXIE) 10-0.6-0.4-200 MG CAPS Take 1 tablet by mouth daily. 30 capsule 11    No Known Allergies   ROS:  Review of Systems - Negative except what is noted in HPI.    Physical Exam:  Blood pressure 125/86, pulse 80, temperature 98.8 F  (37.1 C), temperature source Oral, resp. rate 20, height 4\' 11"  (1.499 m), last menstrual period 10/06/2020, SpO2 99 %. General appearance: alert and no distress Abdomen: soft, non-tender; bowel sounds normal; no masses,  no organomegaly. Gravid. Pelvic: Dilation: 1.5 Effacement (%): 60 Cervical Position: Middle Station: -1 Exam by:: A Franks Extremities: extremities normal, atraumatic, no cyanosis or edema   NST INTERPRETATION: Indications: rule out uterine contractions  Mode: External Baseline Rate (A): 115 bpm Variability: Moderate Accelerations: 15 x 15 Decelerations: None     Contraction Frequency (min): 2-4  Impression: reactive   Assessment:  25 y.o. G1P0000 at [redacted]w[redacted]d with:  1.  Prodromal labor    Plan:  1. NST performed was reviewed and was found to be reactive. Patient was evaluated for a total of 4 hours with minimal cervical change (after ambulating and utilizing birthing ball). She was treated with morphine rest (10 mg of IM Morphine and 25 mg of Phenergan). She was discharged home with bleeding/labor precautions.  Continue routine prenatal care. Follow up with OB/GYN as previously scheduled.   [redacted]w[redacted]d, MD [facility]

## 2021-07-15 NOTE — Transfer of Care (Signed)
Immediate Anesthesia Transfer of Care Note  Patient: Sandra Wilkerson  Procedure(s) Performed: CESAREAN SECTION  Patient Location: Mother/Baby  Anesthesia Type:Epidural  Level of Consciousness: drowsy  Airway & Oxygen Therapy: Patient Spontanous Breathing  Post-op Assessment: Report given to RN and Post -op Vital signs reviewed and stable  Post vital signs: Reviewed and stable  Last Vitals:  Vitals Value Taken Time  BP 118/66   Temp 99.39f   Pulse 113   Resp 18   SpO2 100     Last Pain:  Vitals:   07/15/21 1920  TempSrc: Oral  PainSc:          Complications: No notable events documented.

## 2021-07-16 ENCOUNTER — Encounter: Payer: Self-pay | Admitting: Obstetrics and Gynecology

## 2021-07-16 LAB — CBC WITH DIFFERENTIAL/PLATELET
Abs Immature Granulocytes: 0.09 10*3/uL — ABNORMAL HIGH (ref 0.00–0.07)
Basophils Absolute: 0.1 10*3/uL (ref 0.0–0.1)
Basophils Relative: 0 %
Eosinophils Absolute: 0 10*3/uL (ref 0.0–0.5)
Eosinophils Relative: 0 %
HCT: 22.6 % — ABNORMAL LOW (ref 36.0–46.0)
Hemoglobin: 7.2 g/dL — ABNORMAL LOW (ref 12.0–15.0)
Immature Granulocytes: 1 %
Lymphocytes Relative: 19 %
Lymphs Abs: 3.7 10*3/uL (ref 0.7–4.0)
MCH: 22.9 pg — ABNORMAL LOW (ref 26.0–34.0)
MCHC: 31.9 g/dL (ref 30.0–36.0)
MCV: 72 fL — ABNORMAL LOW (ref 80.0–100.0)
Monocytes Absolute: 1 10*3/uL (ref 0.1–1.0)
Monocytes Relative: 5 %
Neutro Abs: 14.6 10*3/uL — ABNORMAL HIGH (ref 1.7–7.7)
Neutrophils Relative %: 75 %
Platelets: 285 10*3/uL (ref 150–400)
RBC: 3.14 MIL/uL — ABNORMAL LOW (ref 3.87–5.11)
RDW: 15.5 % (ref 11.5–15.5)
WBC: 19.4 10*3/uL — ABNORMAL HIGH (ref 4.0–10.5)
nRBC: 0 % (ref 0.0–0.2)

## 2021-07-16 LAB — HEMOGLOBIN AND HEMATOCRIT, BLOOD
HCT: 23.2 % — ABNORMAL LOW (ref 36.0–46.0)
Hemoglobin: 7.5 g/dL — ABNORMAL LOW (ref 12.0–15.0)

## 2021-07-16 LAB — PREPARE RBC (CROSSMATCH)

## 2021-07-16 LAB — RPR: RPR Ser Ql: NONREACTIVE

## 2021-07-16 LAB — FETAL SCREEN: Fetal Screen: NEGATIVE

## 2021-07-16 MED ORDER — DIPHENHYDRAMINE HCL 50 MG/ML IJ SOLN: 12.5000 mg | INTRAMUSCULAR | Status: DC | PRN

## 2021-07-16 MED ORDER — DIPHENHYDRAMINE HCL 25 MG PO CAPS: 25.0000 mg | ORAL_CAPSULE | Freq: Four times a day (QID) | ORAL | Status: DC | PRN

## 2021-07-16 MED ORDER — OXYCODONE-ACETAMINOPHEN 5-325 MG PO TABS
1.0000 | ORAL_TABLET | ORAL | Status: DC | PRN
  Administered 2021-07-16 – 2021-07-18 (×8): 1 via ORAL
  Filled 2021-07-16 (×8): qty 1

## 2021-07-16 MED ORDER — NALOXONE HCL 0.4 MG/ML IJ SOLN: 0.4000 mg | INTRAMUSCULAR | Status: DC | PRN

## 2021-07-16 MED ORDER — KETOROLAC TROMETHAMINE 30 MG/ML IJ SOLN: 30.0000 mg | Freq: Four times a day (QID) | INTRAMUSCULAR | Status: AC | PRN

## 2021-07-16 MED ORDER — TRANEXAMIC ACID-NACL 1000-0.7 MG/100ML-% IV SOLN: 1000.0000 mg | Freq: Once | INTRAVENOUS | Status: AC

## 2021-07-16 MED ORDER — NALBUPHINE HCL 10 MG/ML IJ SOLN: 5.0000 mg | INTRAMUSCULAR | Status: DC | PRN

## 2021-07-16 MED ORDER — CARBOPROST TROMETHAMINE 250 MCG/ML IM SOLN: 250.0000 ug | INTRAMUSCULAR | Status: DC | PRN

## 2021-07-16 MED ORDER — ZOLPIDEM TARTRATE 5 MG PO TABS: 5.0000 mg | ORAL_TABLET | Freq: Every evening | ORAL | Status: DC | PRN

## 2021-07-16 MED ORDER — MENTHOL 3 MG MT LOZG
1.0000 | LOZENGE | OROMUCOSAL | Status: DC | PRN
  Filled 2021-07-16: qty 9

## 2021-07-16 MED ORDER — TRANEXAMIC ACID-NACL 1000-0.7 MG/100ML-% IV SOLN
INTRAVENOUS | Status: AC
  Administered 2021-07-16: 1000 mg
  Filled 2021-07-16: qty 100

## 2021-07-16 MED ORDER — SODIUM CHLORIDE 0.9% IV SOLUTION: Freq: Once | INTRAVENOUS | Status: AC

## 2021-07-16 MED ORDER — SODIUM CHLORIDE 0.9% FLUSH: 3.0000 mL | INTRAVENOUS | Status: DC | PRN

## 2021-07-16 MED ORDER — OXYCODONE HCL 5 MG PO TABS: 5.0000 mg | ORAL_TABLET | ORAL | Status: DC | PRN

## 2021-07-16 MED ORDER — NALOXONE HCL 4 MG/10ML IJ SOLN
1.0000 ug/kg/h | INTRAVENOUS | Status: DC | PRN
  Filled 2021-07-16: qty 5

## 2021-07-16 MED ORDER — NALBUPHINE HCL 10 MG/ML IJ SOLN: 5.0000 mg | Freq: Once | INTRAMUSCULAR | Status: DC | PRN

## 2021-07-16 MED ORDER — OXYTOCIN-SODIUM CHLORIDE 30-0.9 UT/500ML-% IV SOLN: 2.5000 [IU]/h | INTRAVENOUS | Status: AC

## 2021-07-16 MED ORDER — METHYLERGONOVINE MALEATE 0.2 MG/ML IJ SOLN
0.2000 mg | Freq: Once | INTRAMUSCULAR | Status: DC
Start: 2021-07-16 — End: 2021-07-18

## 2021-07-16 MED ORDER — ONDANSETRON HCL 4 MG/2ML IJ SOLN: 4.0000 mg | Freq: Three times a day (TID) | INTRAMUSCULAR | Status: DC | PRN

## 2021-07-16 MED ORDER — SENNOSIDES-DOCUSATE SODIUM 8.6-50 MG PO TABS
2.0000 | ORAL_TABLET | ORAL | Status: DC
  Administered 2021-07-16 – 2021-07-17 (×2): 2 via ORAL
  Filled 2021-07-16 (×3): qty 2

## 2021-07-16 MED ORDER — RHO D IMMUNE GLOBULIN 1500 UNIT/2ML IJ SOSY
300.0000 ug | PREFILLED_SYRINGE | Freq: Once | INTRAMUSCULAR | Status: AC
  Administered 2021-07-16: 300 ug via INTRAVENOUS
  Filled 2021-07-16: qty 2

## 2021-07-16 MED ORDER — IBUPROFEN 600 MG PO TABS
600.0000 mg | ORAL_TABLET | Freq: Four times a day (QID) | ORAL | Status: DC
  Administered 2021-07-16 – 2021-07-17 (×7): 600 mg via ORAL
  Filled 2021-07-16 (×8): qty 1

## 2021-07-16 MED ORDER — SODIUM CHLORIDE 0.9% IV SOLUTION: Freq: Once | INTRAVENOUS | Status: DC

## 2021-07-16 MED ORDER — PRENATAL MULTIVITAMIN CH
1.0000 | ORAL_TABLET | Freq: Every day | ORAL | Status: DC
  Administered 2021-07-16 – 2021-07-17 (×2): 1 via ORAL
  Filled 2021-07-16 (×2): qty 1

## 2021-07-16 MED ORDER — SIMETHICONE 80 MG PO CHEW
80.0000 mg | CHEWABLE_TABLET | Freq: Four times a day (QID) | ORAL | Status: DC
  Administered 2021-07-16 – 2021-07-18 (×9): 80 mg via ORAL
  Filled 2021-07-16 (×9): qty 1

## 2021-07-16 MED ORDER — MISOPROSTOL 200 MCG PO TABS
800.0000 ug | ORAL_TABLET | Freq: Once | ORAL | Status: DC
Start: 2021-07-16 — End: 2021-07-18

## 2021-07-16 MED ORDER — KETOROLAC TROMETHAMINE 30 MG/ML IJ SOLN
30.0000 mg | Freq: Four times a day (QID) | INTRAMUSCULAR | Status: AC | PRN
Start: 2021-07-16 — End: 2021-07-17
  Administered 2021-07-16: 30 mg via INTRAVENOUS
  Filled 2021-07-16: qty 1

## 2021-07-16 MED ORDER — DIPHENHYDRAMINE HCL 25 MG PO CAPS: 25.0000 mg | ORAL_CAPSULE | ORAL | Status: DC | PRN

## 2021-07-16 MED ORDER — LACTATED RINGERS IV SOLN: INTRAVENOUS | Status: DC

## 2021-07-16 NOTE — Progress Notes (Signed)
Progress Note - Cesarean Delivery  Sandra Wilkerson is a 25 y.o. G1P1001 now PP day 1 s/p C-Section, Low Transverse .   Subjective:  Patient reports no problems with eating, bowel movements, voiding, or their wound  Not yet OOB today. Is breastfeeding Pain controlled  Objective:  Vital signs in last 24 hours: Temp:  [98 F (36.7 C)-99.8 F (37.7 C)] 98.3 F (36.8 C) (10/08 0820) Pulse Rate:  [80-158] 96 (10/08 0820) Resp:  [12-36] 20 (10/08 0820) BP: (64-138)/(47-87) 109/72 (10/08 0820) SpO2:  [94 %-100 %] 97 % (10/08 0820)  Physical Exam:  General: alert, cooperative, fatigued, and no distress Lochia: appropriate  Uterine Fundus: firm Incision: Dressing intact - dry    Data Review Recent Labs    07/15/21 0802 07/16/21 0526  HGB 10.7* 7.2*  HCT 33.7* 22.6*    Assessment:  Active Problems:   Post-dates pregnancy   Status post Cesarean section.    No further significant bleeding.  However, Hgb is decreased and pt is symptomatic.  As pt further hydrates - expect Hgb to decline.  Urine output decreased - likely from blood loss.  Plan:       Discussed RBCs with patient.  Will give 1 U and re-check Hgb.  Likely will need a second.  OOB - May shower  - Dressing removal  Elonda Husky, M.D. 07/16/2021 9:46 AM

## 2021-07-16 NOTE — Op Note (Signed)
      OP NOTE  Date: 07/16/2021   12:32 AM Name Sandra Wilkerson MR# [id number]  Preoperative Diagnosis: 1. Intrauterine pregnancy at [redacted]w[redacted]d Active Problems:   Post-dates pregnancy  2.  Failure to dilated 3.  True CPD 4.  Maternal low grade temperature - Possible early chorio  Postoperative Diagnosis: 1. Intrauterine pregnancy at [redacted]w[redacted]d, delivered 2. Viable infant 3.  Bleeding placental site   Procedure: 1. Primary Low-Transverse Cesarean Section  Surgeon: Elonda Husky, MD  Assistant:  Janee Morn, CNM.  No other capable assistant was available for this surgery which requires an experienced, high level assistant.  She provided exposure, dissection, suctioning, retraction, and general support and assistance during the procedure.   Anesthesia:     EBL:     Findings: 1) female infant, Apgar scores of 7   at 1 minute and 9   at 5 minutes and a birthweight of 126.63  ounces.    2) Normal uterus, tubes and ovaries.    Procedure:  The patient was prepped and draped in the supine position and placed under spinal anesthesia.  A transverse incision was made across the abdomen in a Pfannenstiel manner. If indicated the old scar was systematically removed with sharp dissection.  We carried the dissection down to the level of the fascia.  The fascia was incised in a curvilinear manner.  The fascia was then elevated from the rectus muscles with blunt and sharp dissection.  The rectus muscles were separated laterally exposing the peritoneum.  The peritoneum was carefully entered with care being taken to avoid bowel and bladder.  A self-retaining retractor was placed.  The visceral peritoneum was incised in a curvilinear fashion across the lower uterine segment creating a bladder flap. A transverse incision was made across the lower uterine segment and extended laterally and superiorly using the bandage scissors.  Artificial rupture membranes was performed and thick purulent fluid was  noted.  The infant was delivered from the cephalic position.  A nuchal cord was present. After an appropriate time interval, the cord was doubly clamped and cut. Cord blood was obtained if required.  The infant was handed to the pediatric personnel  who then placed the infant under heat lamps where it was cleaned dried and suctioned as needed. The placenta was delivered. After removal of the membranes and cleaning of the endometrial cavity with a wet sponge, there was very brisk bleeding from the placental site . I held pressure for 2 minutes, but the bleeding continued.  I then used 000 vicryl to oversew the base with multiple locking sutures. Hemostasis was obtained. The hysterotomy incision was then identified on ring forceps.  The uterine cavity was cleaned with a moist lap sponge.  The hysterotomy incision was closed with a running interlocking suture of Vicryl.  Hemostasis was excellent.  Pitocin was run in the IV and the uterus was found to be firm. The posterior cul-de-sac and gutters were cleaned and inspected.  Hemostasis was noted.  The fascia was then closed with a running suture of #1 Vicryl.  Hemostasis of the subcutaneous tissues was obtained using the Bovie.  The subcutaneous tissues were closed with a running suture of 000 Vicryl.  A subcuticular suture was placed.  Derma bond was applied in the usual manner.  A pressure dressing was placed.  The patient went to the recovery room in stable condition.   Elonda Husky, M.D. 07/16/2021 12:32 AM

## 2021-07-16 NOTE — Anesthesia Postprocedure Evaluation (Signed)
Anesthesia Post Note  Patient: Sandra Wilkerson  Procedure(s) Performed: CESAREAN SECTION  Patient location during evaluation: Mother Baby Anesthesia Type: Epidural Level of consciousness: awake and alert Pain management: pain level controlled Vital Signs Assessment: post-procedure vital signs reviewed and stable Respiratory status: spontaneous breathing, nonlabored ventilation and respiratory function stable Cardiovascular status: stable Postop Assessment: no headache, no backache and epidural receding Anesthetic complications: no   No notable events documented.   Last Vitals:  Vitals:   07/16/21 0329 07/16/21 0449  BP: 108/69 99/68  Pulse: (!) 110 (!) 102  Resp: 18 18  Temp: 36.8 C 36.7 C  SpO2: 98% 97%    Last Pain:  Vitals:   07/16/21 0449  TempSrc: Oral  PainSc:                  Rica Mast

## 2021-07-16 NOTE — Anesthesia Post-op Follow-up Note (Signed)
  Anesthesia Pain Follow-up Note  Patient: Sandra Wilkerson  Day #: 1  Date of Follow-up: 07/16/2021 Time: 7:56 AM  Last Vitals:  Vitals:   07/16/21 0329 07/16/21 0449  BP: 108/69 99/68  Pulse: (!) 110 (!) 102  Resp: 18 18  Temp: 36.8 C 36.7 C  SpO2: 98% 97%    Level of Consciousness: alert  Pain: mild   Side Effects:None  Catheter Site Exam:clean, dry     Plan: D/C from anesthesia care at surgeon's request  Rica Mast

## 2021-07-16 NOTE — Progress Notes (Addendum)
Pt received 1 Unit of RBC's and stated she's feeling much better. No complaints of dizziness or feeling lightheaded upon standing. Placed repeat H&H for 1700.

## 2021-07-17 ENCOUNTER — Encounter: Payer: Self-pay | Admitting: Obstetrics and Gynecology

## 2021-07-17 LAB — RHOGAM INJECTION: Unit division: 0

## 2021-07-17 LAB — HEMOGLOBIN AND HEMATOCRIT, BLOOD
HCT: 25.1 % — ABNORMAL LOW (ref 36.0–46.0)
Hemoglobin: 8.4 g/dL — ABNORMAL LOW (ref 12.0–15.0)

## 2021-07-17 MED ORDER — TRANEXAMIC ACID-NACL 1000-0.7 MG/100ML-% IV SOLN
1000.0000 mg | Freq: Once | INTRAVENOUS | Status: AC
  Administered 2021-07-17: 1000 mg via INTRAVENOUS
  Filled 2021-07-17 (×2): qty 100

## 2021-07-17 MED ORDER — SODIUM CHLORIDE 0.9% IV SOLUTION: Freq: Once | INTRAVENOUS | Status: AC

## 2021-07-17 MED ORDER — TRANEXAMIC ACID 650 MG PO TABS
1300.0000 mg | ORAL_TABLET | Freq: Three times a day (TID) | ORAL | Status: DC
  Administered 2021-07-17 – 2021-07-18 (×2): 1300 mg via ORAL
  Filled 2021-07-17 (×4): qty 2

## 2021-07-17 NOTE — Progress Notes (Signed)
Patient ID: Sandra Wilkerson, female   DOB: 1996/06/15, 25 y.o.   MRN: [id number]   Progress Note - Cesarean Delivery  Sandra Wilkerson is a 25 y.o. G1P1001 now PP day 2 s/p C-Section, Low Transverse .   Subjective:  Patient reports no problems with eating, bowel movements, voiding, or their wound  Pain controlled.  Objective:  Vital signs in last 24 hours: Temp:  [98 F (36.7 C)-98.8 F (37.1 C)] 98 F (36.7 C) (10/09 0800) Pulse Rate:  [84-101] 99 (10/09 0800) Resp:  [16-18] 16 (10/09 0800) BP: (118-128)/(67-82) 121/82 (10/09 0800) SpO2:  [99 %-100 %] 99 % (10/09 0800)  Physical Exam:  General: alert, cooperative, and no distress Lochia:  Pt has passed 2 large clots/per vagina over the last 4 hours.  (778mL+) Uterine Fundus: firm - no further clots produced with vigorous fundal massage Incision: healing well - honeycomb in place    Data Review Recent Labs    07/16/21 0526 07/16/21 1706  HGB 7.2* 7.5*  HCT 22.6* 23.2*    Assessment:  Active Problems:   Post-dates pregnancy   Status post Cesarean section.    Increased vaginal bleeding likely secondary to placental site issue   Plan:       TXA - IV  (worked last time)   TXA PO at discharge for 3 days  1uPRBCs possibly 2 as needed.  Hold discharge until tomorrow  Elonda Husky, M.D. 07/17/2021 11:40 AM

## 2021-07-17 NOTE — Progress Notes (Signed)
1 unit of RBC's complete. Repeat H&H placed for 1745.

## 2021-07-17 NOTE — Significant Event (Signed)
Patient called out stating they needed immediate assistance.  Upon entering room patient standing next to the bed in a large pool of blood with a large clot present. This RN assisted patient back to bed, VSS. Complaints of moderate cramping. SCC, charge RN, and student nurse present at the bedside. Bleeding controlled, firm fundus. Dr. Logan Bores arrived at bedside. QBL 702. New orders for TXA and 1 unit of RBC's.

## 2021-07-18 LAB — BPAM RBC
Blood Product Expiration Date: 202211092359
Blood Product Expiration Date: 202211102359
ISSUE DATE / TIME: 202210081034
ISSUE DATE / TIME: 202210091237
Unit Type and Rh: 9500
Unit Type and Rh: 9500

## 2021-07-18 LAB — TYPE AND SCREEN
ABO/RH(D): O NEG
Antibody Screen: POSITIVE
Unit division: 0
Unit division: 0

## 2021-07-18 MED ORDER — TRANEXAMIC ACID 650 MG PO TABS: 1300.0000 mg | ORAL_TABLET | Freq: Three times a day (TID) | ORAL | 0 refills | Status: DC

## 2021-07-18 MED ORDER — IBUPROFEN 600 MG PO TABS: 600.0000 mg | ORAL_TABLET | Freq: Four times a day (QID) | ORAL | 0 refills | Status: DC

## 2021-07-18 MED ORDER — HYDROCODONE-ACETAMINOPHEN 5-325 MG PO TABS: 1.0000 | ORAL_TABLET | Freq: Four times a day (QID) | ORAL | 0 refills | Status: DC | PRN

## 2021-07-18 NOTE — Discharge Summary (Signed)
Physician Obstetric Discharge Summary  Patient Name: Sandra Wilkerson DOB: 07-30-1996 MRN: [id number]                            Discharge Summary  Date of Admission: 07/15/2021 Date of Discharge: 07/18/2021 Delivering Provider: Linzie Collin   Admitting Diagnosis: Post-dates pregnancy [O48.0] at 106w2d Secondary diagnosis:  Active Problems:   Post-dates pregnancy   CPD - failure to dilate, did not descend past pelvic inlet   Post partum hemorrhage - bleeding from placental bed  Mode of Delivery:       low uterine, transverse     Discharge diagnosis: Term Pregnancy Delivered      Intrapartum Procedures: epidural, pitocin augmentation, and placement of fetal scalp electrode   Post partum procedures: blood transfusion  Complications: hemorrhage                     Discharge Day SOAP Note:  Subjective:  The patient has no complaints.  She is ambulating well. She is taking PO well. She has had no further significant bleeding episodes in 24 hours. No lightheadedness Pain is well controlled with current medications. Patient is urinating without difficulty.   She is passing flatus.    Objective  Vital signs: BP 115/74 (BP Location: Left Arm)   Pulse (!) 116   Temp 98.3 F (36.8 C) (Oral)   Resp 15   Wt 87.5 kg   LMP 10/06/2020   SpO2 97%   Breastfeeding Yes   BMI 38.98 kg/m   Physical Exam: Gen: NAD Abdomen:  Honeycomb dressing in place Fundus Fundal Tone: Firm  Lochia Amount: Scant     Data Review Labs: Lab Results  Component Value Date   WBC 19.4 (H) 07/16/2021   HGB 8.4 (L) 07/17/2021   HCT 25.1 (L) 07/17/2021   MCV 72.0 (L) 07/16/2021   PLT 285 07/16/2021   CBC Latest Ref Rng & Units 07/17/2021 07/16/2021 07/16/2021  WBC 4.0 - 10.5 K/uL - - 19.4(H)  Hemoglobin 12.0 - 15.0 g/dL 4.0(J) 7.5(L) 7.2(L)  Hematocrit 36.0 - 46.0 % 25.1(L) 23.2(L) 22.6(L)  Platelets 150 - 400 K/uL - - 285   O NEG  Edinburgh Score: Edinburgh Postnatal Depression  Scale Screening Tool 07/17/2021  I have been able to laugh and see the funny side of things. 0  I have looked forward with enjoyment to things. 0  I have blamed myself unnecessarily when things went wrong. 1  I have been anxious or worried for no good reason. 0  I have felt scared or panicky for no good reason. 0  Things have been getting on top of me. 0  I have been so unhappy that I have had difficulty sleeping. 0  I have felt sad or miserable. 0  I have been so unhappy that I have been crying. 0  The thought of harming myself has occurred to me. 0  Edinburgh Postnatal Depression Scale Total 1    Assessment:  Active Problems:   Post-dates pregnancy   Doing well.  Normal progress as expected. No further bleeding-patient on TXA. Plan:  Discharge to home  Modified rest as directed - may slowly resume normal activities with restrictions  as discussed.  Medications as written. Advised to begin iron twice daily with meals-patient has iron at home and has previously received iron infusions because of her Crohn's disease.  See below for additional.  Discharge Instructions: Per After Visit Summary. Activity: Advance as tolerated. Pelvic rest for 6 weeks.  Also refer to After Visit Summary.  Wound care discussed. Diet: Regular Medications: Allergies as of 07/18/2021   No Known Allergies      Medication List     STOP taking these medications    inFLIXimab 100 MG injection Commonly known as: REMICADE       TAKE these medications    HYDROcodone-acetaminophen 5-325 MG tablet Commonly known as: NORCO/VICODIN Take 1-2 tablets by mouth every 6 (six) hours as needed for moderate pain.   HYDROcodone-acetaminophen 5-325 MG tablet Commonly known as: NORCO/VICODIN Take 1-2 tablets by mouth every 6 (six) hours as needed for moderate pain.   ibuprofen 600 MG tablet Commonly known as: ADVIL Take 1 tablet (600 mg total) by mouth every 6 (six) hours.   Prenate Pixie  10-0.6-0.4-200 MG Caps Take 1 tablet by mouth daily.   tranexamic acid 650 MG Tabs tablet Commonly known as: LYSTEDA Take 2 tablets (1,300 mg total) by mouth 3 (three) times daily for 3 days.               Discharge Care Instructions  (From admission, onward)           Start     Ordered   07/18/21 0000  No dressing needed       Comments: Keep wound area clean and dry   07/18/21 0854   07/18/21 0000  No dressing needed       Comments: Keep wound area clean and dry   07/18/21 0858           Outpatient follow up:   Follow-up Information     Linzie Collin, MD Follow up in 1 week(s).   Specialties: Obstetrics and Gynecology, Radiology Contact information: [address] [place] [address] [phone]                Postpartum contraception: Will discuss at first post-partum visit.  Discharged Condition: good  Discharged to: home  Newborn Data: Disposition:home with mother  Apgars: APGAR (1 MIN): 7   APGAR (5 MINS): 9   APGAR (10 MINS):    Baby Feeding: Breast  Elonda Husky, M.D. 07/18/2021 9:00 AM

## 2021-07-18 NOTE — Progress Notes (Signed)
Patient discharged with infant. Discharge instructions, prescriptions, and follow up appointments given to and reviewed with patient. Patient verbalized understanding. Will be escorted out by axillary.  °

## 2021-07-18 NOTE — Lactation Note (Signed)
This note was copied from a baby's chart. Lactation Consultation Note  Patient Name: Boy Kida Digiulio YFVCB'S Date: 07/18/2021 Reason for consult: Initial assessment;Primapara;Term;RN request;Other (Comment) (c-section) Age:27 hours  Initial lactation visit for P1 mom who delivered via c-section 60hrs ago. Mom has large breasts with bilateral flat/inverted nipples. Mom has been given a size 25mm NS, and baby appears to be doing well with this.  LC able to see a feeding attempt this morning, parents work well together with pillow placement and support and bringing baby to the breast in good position/alignment. Baby latches easily, has rhythmic sucking pattern. When baby does come off the breast milk is noticeable in the shield. LC praised parents for their team work.  LC reviews newborn feeding patterns and behaviors, early cues, acceptance tips of both breasts, and output expectations. Signs of adequate intake reviewed as well as cluster feeding and growth spurts.  Mom voices a desire to pump/bottle feed with time. LC talked mom through plan of bottle introduction, pumping routine/consistency, and continuation of baby at the breast as desired. Encouraged mom to work with baby at the breast for the first 3 weeks to help ensure an adequate milk production establishment, and resources to look at for paced-bottle feeding for when pump is introduced.   LC reviewed with mom anticipated breast changes, management of breast fullness and engorgement, and nipple care. Information given for outpatient lactation services and community BF support. Encouraged to call for ongoing BF support as needed.  Maternal Data Has patient been taught Hand Expression?: Yes Does the patient have breastfeeding experience prior to this delivery?: No  Feeding Mother's Current Feeding Choice: Breast Milk  LATCH Score Latch: Grasps breast easily, tongue down, lips flanged, rhythmical sucking.  Audible Swallowing: A few  with stimulation  Type of Nipple: Flat  Comfort (Breast/Nipple): Soft / non-tender  Hold (Positioning): No assistance needed to correctly position infant at breast.  LATCH Score: 8   Lactation Tools Discussed/Used Tools: Nipple Dorris Carnes;Pump Nipple shield size: 24 Breast pump type: Double-Electric Breast Pump Reason for Pumping: flat nipples/nipple shield Pumping frequency: as needed  Interventions Interventions: Breast feeding basics reviewed;Hand express;Pre-pump if needed;Reverse pressure;Support pillows;Position options;DEBP;Hand pump;Education  Discharge Discharge Education: Engorgement and breast care;Warning signs for feeding baby;Outpatient recommendation Pump: Personal  Consult Status Consult Status: Complete    Danford Bad 07/18/2021, 9:57 AM

## 2021-07-19 LAB — PREPARE RBC (CROSSMATCH)

## 2021-07-20 ENCOUNTER — Ambulatory Visit (INDEPENDENT_AMBULATORY_CARE_PROVIDER_SITE_OTHER): Payer: BC Managed Care – PPO | Admitting: Obstetrics and Gynecology

## 2021-07-20 ENCOUNTER — Encounter: Payer: Self-pay | Admitting: Obstetrics and Gynecology

## 2021-07-20 ENCOUNTER — Other Ambulatory Visit: Payer: Self-pay

## 2021-07-20 VITALS — BP 127/86 | HR 103 | Ht 59.0 in | Wt 195.2 lb

## 2021-07-20 DIAGNOSIS — Z9889 Other specified postprocedural states: Secondary | ICD-10-CM

## 2021-07-20 NOTE — Progress Notes (Signed)
HPI:      [name] is a 25 y.o. G1P1001 who LMP was No LMP recorded.  Subjective:   She presents today 5 days postop from cesarean delivery.  Her delivery was complicated by postpartum hemorrhage blood transfusion and use of TXA.  The patient is no longer taking TXA and reports minimal bleeding without any clots while at home.  I have continued to encourage her to use oral iron. She continues to breast-feed. She says her pain is controlled.    Hx: The following portions of the patient's history were reviewed and updated as appropriate:             She  has a past medical history of Crohn disease (HCC). She does not have any pertinent problems on file. She  has a past surgical history that includes Colectomy; Appendectomy; Colostomy; Colostomy reversal; and Cesarean section (07/15/2021). Her family history includes Asthma in her father; Diabetes in her maternal grandmother; Healthy in her mother. She  reports that she has never smoked. She has never used smokeless tobacco. She reports that she does not drink alcohol and does not use drugs. She has a current medication list which includes the following prescription(s): hydrocodone-acetaminophen, ibuprofen, and prenate pixie. She has No Known Allergies.       Review of Systems:  Review of Systems  Constitutional: Denied constitutional symptoms, night sweats, recent illness, fatigue, fever, insomnia and weight loss.  Eyes: Denied eye symptoms, eye pain, photophobia, vision change and visual disturbance.  Ears/Nose/Throat/Neck: Denied ear, nose, throat or neck symptoms, hearing loss, nasal discharge, sinus congestion and sore throat.  Cardiovascular: Denied cardiovascular symptoms, arrhythmia, chest pain/pressure, edema, exercise intolerance, orthopnea and palpitations.  Respiratory: Denied pulmonary symptoms, asthma, pleuritic pain, productive sputum, cough, dyspnea and wheezing.  Gastrointestinal: Denied, gastro-esophageal reflux,  melena, nausea and vomiting.  Genitourinary: Denied genitourinary symptoms including symptomatic vaginal discharge, pelvic relaxation issues, and urinary complaints.  Musculoskeletal: Denied musculoskeletal symptoms, stiffness, swelling, muscle weakness and myalgia.  Dermatologic: Denied dermatology symptoms, rash and scar.  Neurologic: Denied neurology symptoms, dizziness, headache, neck pain and syncope.  Psychiatric: Denied psychiatric symptoms, anxiety and depression.  Endocrine: Denied endocrine symptoms including hot flashes and night sweats.   Meds:   Current Outpatient Medications on File Prior to Visit  Medication Sig Dispense Refill   HYDROcodone-acetaminophen (NORCO/VICODIN) 5-325 MG tablet Take 1-2 tablets by mouth every 6 (six) hours as needed for moderate pain. 30 tablet 0   ibuprofen (ADVIL) 600 MG tablet Take 1 tablet (600 mg total) by mouth every 6 (six) hours. 30 tablet 0   Prenat-FeAsp-Meth-FA-DHA w/o A (PRENATE PIXIE) 10-0.6-0.4-200 MG CAPS Take 1 tablet by mouth daily. 30 capsule 11   No current facility-administered medications on file prior to visit.      Objective:     Vitals:   07/20/21 1404  BP: 127/86  Pulse: (!) 103   Filed Weights   07/20/21 1404  Weight: 195 lb 3.2 oz (88.5 kg)               Abdomen: Soft.  Non-tender.  No masses.  No HSM.  Incision/s: Intact.  Healing well.  No erythema.  No drainage.  Honeycomb dressing remains in place             Assessment:    G1P1001 Patient Active Problem List   Diagnosis Date Noted   Post-dates pregnancy 07/15/2021   Uterine contractions during pregnancy    Pregnancy 07/12/2021   Group B  streptococcal carriage complicating pregnancy 06/29/2021   Indication for care in labor or delivery 05/16/2021   Abdominal pregnancy with intrauterine pregnancy 01/06/2021   Subchorionic hematoma in first trimester 12/29/2020   History of infertility 12/29/2020   Obesity (BMI 30-39.9) 12/29/2020   PCO  (polycystic ovaries) 12/29/2020   Iron deficiency 03/13/2018   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 Post-operative state     Excellent recovery.  No further issues with significant postpartum bleeding.   Plan:            1.  May remove honeycomb dressing in the next 2 to 4 days.  2.  Advised use of oral iron as previously discussed Orders No orders of the defined types were placed in this encounter.   No orders of the defined types were placed in this encounter.     F/U  Return in about 5 weeks (around 08/24/2021).  Elonda Husky, M.D. 07/20/2021 2:14 PM

## 2021-08-02 DIAGNOSIS — K50919 Crohn's disease, unspecified, with unspecified complications: Secondary | ICD-10-CM | POA: Diagnosis not present

## 2021-08-04 ENCOUNTER — Emergency Department: Payer: BC Managed Care – PPO

## 2021-08-04 ENCOUNTER — Encounter: Payer: Self-pay | Admitting: Intensive Care

## 2021-08-04 ENCOUNTER — Telehealth: Payer: Self-pay | Admitting: Obstetrics and Gynecology

## 2021-08-04 ENCOUNTER — Emergency Department
Admission: EM | Admit: 2021-08-04 | Discharge: 2021-08-04 | Disposition: A | Discharge status: Home / Self Care | Payer: BC Managed Care – PPO | Attending: Emergency Medicine | Admitting: Emergency Medicine

## 2021-08-04 ENCOUNTER — Other Ambulatory Visit: Payer: Self-pay

## 2021-08-04 DIAGNOSIS — R109 Unspecified abdominal pain: Secondary | ICD-10-CM

## 2021-08-04 DIAGNOSIS — R1011 Right upper quadrant pain: Secondary | ICD-10-CM | POA: Diagnosis not present

## 2021-08-04 DIAGNOSIS — R1013 Epigastric pain: Secondary | ICD-10-CM | POA: Insufficient documentation

## 2021-08-04 DIAGNOSIS — Z8719 Personal history of other diseases of the digestive system: Secondary | ICD-10-CM | POA: Diagnosis not present

## 2021-08-04 HISTORY — DX: Polycystic ovarian syndrome: E28.2

## 2021-08-04 LAB — CBC WITH DIFFERENTIAL/PLATELET
Abs Immature Granulocytes: 0.01 10*3/uL (ref 0.00–0.07)
Basophils Absolute: 0.1 10*3/uL (ref 0.0–0.1)
Basophils Relative: 1 %
Eosinophils Absolute: 0.3 10*3/uL (ref 0.0–0.5)
Eosinophils Relative: 4 %
HCT: 34.7 % — ABNORMAL LOW (ref 36.0–46.0)
Hemoglobin: 11.2 g/dL — ABNORMAL LOW (ref 12.0–15.0)
Immature Granulocytes: 0 %
Lymphocytes Relative: 45 %
Lymphs Abs: 3.4 10*3/uL (ref 0.7–4.0)
MCH: 24.2 pg — ABNORMAL LOW (ref 26.0–34.0)
MCHC: 32.3 g/dL (ref 30.0–36.0)
MCV: 74.9 fL — ABNORMAL LOW (ref 80.0–100.0)
Monocytes Absolute: 0.3 10*3/uL (ref 0.1–1.0)
Monocytes Relative: 4 %
Neutro Abs: 3.5 10*3/uL (ref 1.7–7.7)
Neutrophils Relative %: 46 %
Platelets: 483 10*3/uL — ABNORMAL HIGH (ref 150–400)
RBC: 4.63 MIL/uL (ref 3.87–5.11)
RDW: 16.7 % — ABNORMAL HIGH (ref 11.5–15.5)
WBC: 7.5 10*3/uL (ref 4.0–10.5)
nRBC: 0 % (ref 0.0–0.2)

## 2021-08-04 LAB — URINALYSIS, ROUTINE W REFLEX MICROSCOPIC
Bilirubin Urine: NEGATIVE
Glucose, UA: NEGATIVE mg/dL
Ketones, ur: NEGATIVE mg/dL
Nitrite: NEGATIVE
Protein, ur: 30 mg/dL — AB
RBC / HPF: >50 RBC/hpf — ABNORMAL HIGH (ref 0–5)
Specific Gravity, Urine: 1.021 (ref 1.005–1.030)
WBC, UA: >50 WBC/hpf — ABNORMAL HIGH (ref 0–5)
pH: 5 (ref 5.0–8.0)

## 2021-08-04 LAB — COMPREHENSIVE METABOLIC PANEL
ALT: 15 U/L (ref 0–44)
AST: 19 U/L (ref 15–41)
Albumin: 3.5 g/dL (ref 3.5–5.0)
Alkaline Phosphatase: 95 U/L (ref 38–126)
Anion gap: 7 (ref 5–15)
BUN: 16 mg/dL (ref 6–20)
CO2: 23 mmol/L (ref 22–32)
Calcium: 8.8 mg/dL — ABNORMAL LOW (ref 8.9–10.3)
Chloride: 107 mmol/L (ref 98–111)
Creatinine, Ser: 0.67 mg/dL (ref 0.44–1.00)
GFR, Estimated: >60 mL/min (ref >60)
Glucose, Bld: 93 mg/dL (ref 70–99)
Potassium: 3.6 mmol/L (ref 3.5–5.1)
Sodium: 137 mmol/L (ref 135–145)
Total Bilirubin: 0.6 mg/dL (ref 0.3–1.2)
Total Protein: 7.6 g/dL (ref 6.5–8.1)

## 2021-08-04 LAB — LIPASE, BLOOD: Lipase: 31 U/L (ref 11–51)

## 2021-08-04 MED ORDER — HYDROCODONE-ACETAMINOPHEN 5-325 MG PO TABS
1.0000 | ORAL_TABLET | Freq: Once | ORAL | Status: AC
  Administered 2021-08-04: 1 via ORAL
  Filled 2021-08-04: qty 1

## 2021-08-04 MED ORDER — IOHEXOL 300 MG/ML  SOLN
100.0000 mL | Freq: Once | INTRAMUSCULAR | Status: AC | PRN
  Administered 2021-08-04: 100 mL via INTRAVENOUS

## 2021-08-04 MED ORDER — PANTOPRAZOLE SODIUM 20 MG PO TBEC: 20.0000 mg | DELAYED_RELEASE_TABLET | Freq: Every day | ORAL | 1 refills | Status: DC

## 2021-08-04 MED ORDER — SUCRALFATE 1 G PO TABS: 1.0000 g | ORAL_TABLET | Freq: Four times a day (QID) | ORAL | 0 refills | Status: DC

## 2021-08-04 NOTE — ED Provider Notes (Signed)
[facility] Emergency Department Provider Note  ____________________________________________   I have reviewed the triage vital signs and the nursing notes.   HISTORY  Chief Complaint Abdominal Pain   History limited by: Not Limited   HPI Sandra Wilkerson is a 25 y.o. female who presents to the emergency department today because of concerns for abdominal pain.  The pain is located in the epigastric and right upper quadrant.  The patient states that the pain started yesterday.  The pain will come and go.  She has not noticed anything she does makes the pain come on.  She does have history of Crohn's disease although this does not remind her of her Crohn's disease.  The patient did recently have a C-section.  She is still having some soreness around the site of the incision although this is not where her pain is.  Patient denies any fevers.  No nausea or vomiting.   Records reviewed. Per medical record review patient has a history of crohn's disease.  Past Medical History:  Diagnosis Date   Crohn disease (HCC)    PCOS (polycystic ovarian syndrome)     Patient Active Problem List   Diagnosis Date Noted   Post-dates pregnancy 07/15/2021   Uterine contractions during pregnancy    Pregnancy 07/12/2021   Group B streptococcal carriage complicating pregnancy 06/29/2021   Indication for care in labor or delivery 05/16/2021   Abdominal pregnancy with intrauterine pregnancy 01/06/2021   Subchorionic hematoma in first trimester 12/29/2020   History of infertility 12/29/2020   Obesity (BMI 30-39.9) 12/29/2020   PCO (polycystic ovaries) 12/29/2020   Iron deficiency 03/13/2018   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   Crohn's colitis (HCC) 09/12/2017   Other headache syndrome 03/26/2017   Asthma 09/30/2014   Keloid scar 09/19/2011   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 Authorizing Provider  HYDROcodone-acetaminophen (NORCO/VICODIN) 5-325 MG tablet Take 1-2 tablets by mouth every 6 (six) hours as needed for moderate pain. 07/18/21   Linzie Collin, MD  ibuprofen (ADVIL) 600 MG tablet Take 1 tablet (600 mg total) by mouth every 6 (six) hours. 07/18/21   Linzie Collin, MD  Prenat-FeAsp-Meth-FA-DHA w/o A (PRENATE PIXIE) 10-0.6-0.4-200 MG CAPS Take 1 tablet by mouth daily. 12/21/20   Hildred Laser, MD    Allergies Patient has no known allergies.  Family History  Problem Relation Age of Onset   Healthy Mother    Asthma Father    Diabetes Maternal Grandmother     Social History Social History   Tobacco Use   Smoking status: Never   Smokeless tobacco: Never  Vaping Use   Vaping Use: Never used  Substance Use Topics   Alcohol use: No   Drug use: No    Review of Systems Constitutional: No fever/chills Eyes: No visual changes. ENT: No sore throat. Cardiovascular: Denies chest pain. Respiratory: Denies shortness of breath. Gastrointestinal: Positive for abdominal pain. Genitourinary: Negative for dysuria. Musculoskeletal: Negative for back pain. Skin: Negative for rash. Neurological: Negative for headaches, focal weakness or numbness.  ____________________________________________   PHYSICAL EXAM:  VITAL SIGNS: ED Triage Vitals [08/04/21 1344]  Enc Vitals Group     BP 137/86     Pulse Rate 87     Resp 16     Temp 98.9 F (37.2 C)     Temp Source Oral     SpO2 98 %     Weight 171 lb (77.6 kg)     Height 4\' 11"  (1.499 m)     Head Circumference      Peak Flow      Pain Score 10   Constitutional: Alert and oriented.   Eyes: Conjunctivae are normal.  ENT      Head: Normocephalic and atraumatic.      Nose: No congestion/rhinnorhea.      Mouth/Throat: Mucous membranes are moist.      Neck: No stridor. Hematological/Lymphatic/Immunilogical: No cervical lymphadenopathy. Cardiovascular: Normal rate, regular rhythm.  No murmurs, rubs, or gallops.  Respiratory: Normal respiratory effort without tachypnea nor retractions. Breath sounds are clear and equal bilaterally. No wheezes/rales/rhonchi. Gastrointestinal: Soft and non tender. No rebound. No guarding.  Genitourinary: Deferred Musculoskeletal: Normal range of motion in all extremities. No lower extremity edema. Neurologic:  Normal speech and language. No gross focal neurologic deficits are appreciated.  Skin:  Skin is warm, dry and intact. No rash noted. Psychiatric: Mood and affect are normal. Speech and behavior are normal. Patient exhibits appropriate insight and judgment.  ____________________________________________    LABS (pertinent positives/negatives)  Lipase 31 CMP wnl except ca 8.8 CBC wbc 7.5, hgb 11.2, plt 483  ____________________________________________   EKG  None  ____________________________________________    RADIOLOGY  CT abd/pel No acute abnormality  ____________________________________________   PROCEDURES  Procedures  ____________________________________________   INITIAL IMPRESSION / ASSESSMENT AND PLAN / ED COURSE  Pertinent labs & imaging results that were available during my care of the patient were reviewed by me and considered in my medical decision making (see chart for details).   Patient presented to the emergency department today because of concerns for abdominal pain.  Located in the epigastric and right upper quadrant.  On exam abdomen was benign.  Patient does have history of Crohn's disease status post colectomy.  Additionally patient recently had C-section delivery.  Because of this CT  abdomen pelvis was obtained.  Likely this did not show any acute intra-abdominal abnormalities.  Given location of pain and negative CT findings I do wonder if patient is suffering from gastroenteritis/duodenitis.  I discussed this with the patient.  Will start patient on medication to help with these conditions.  Encouraged primary care follow-up. ____________________________________________   FINAL CLINICAL IMPRESSION(S) / ED DIAGNOSES  Final diagnoses:  Abdominal pain, unspecified abdominal location     Note: This dictation was prepared with Dragon dictation. Any transcriptional errors that result from this process are unintentional     , MD 08/04/21 1907

## 2021-08-04 NOTE — ED Triage Notes (Signed)
Patient c/o abdominal pain with some diarrhea. Denies N/V. HX chrohns. 3 weeks post partum. Reports light vaginal bleeding with post partum.Reports pain is not at C-section incision. Pain started yesterday and has progressively gotten worse and now sharp pains.

## 2021-08-04 NOTE — Telephone Encounter (Signed)
Sandra Wilkerson called in and states she has been experiencing headaches and soreness in her abdomen.  Sandra Wilkerson states her headaches started about a week ago and her abdominal soreness started this Monday.  Sandra Wilkerson states she has also lost 17 pounds in a week and wants to know if this is normal.  Sandra Wilkerson denies fever, bleeding, or cramping.  Please advise.

## 2021-08-04 NOTE — ED Provider Notes (Signed)
Emergency Medicine Provider Triage Evaluation Note  Sandra Wilkerson , a 25 y.o. female  was evaluated in triage.  Pt complains of upper abdominal pain and 3 weeks postpartum. History of crohn's and c-section. Pain started yesterday, but worse since noon today. No suspected fever. No relief with tylenol.  Pain is sharp. Post c-section she required blood transfusion and TXA.  Review of Systems  Positive: Abdominal pain. Light vaginal bleeding. Negative: fever  Physical Exam  BP 137/86 (BP Location: Left Arm)   Pulse 87   Temp 98.9 F (37.2 C) (Oral)   Resp 16   Ht 4\' 11"  (1.499 m)   Wt 77.6 kg   SpO2 98%   Breastfeeding No   BMI 34.54 kg/m  Gen:   Awake, no distress   Resp:  Normal effort  MSK:   Moves extremities without difficulty  Other:    Medical Decision Making  Medically screening exam initiated at 1:47 PM.  Appropriate orders placed.  Sandra Wilkerson was informed that the remainder of the evaluation will be completed by another provider, this initial triage assessment does not replace that evaluation, and the importance of remaining in the ED until their evaluation is complete.   , FNP 08/04/21 1347    08/06/21, MD 08/04/21 1732

## 2021-08-04 NOTE — Discharge Instructions (Signed)
Please seek medical attention for any high fevers, chest pain, shortness of breath, change in behavior, persistent vomiting, bloody stool or any other new or concerning symptoms.  

## 2021-08-05 LAB — URINE CULTURE

## 2021-08-08 NOTE — Telephone Encounter (Signed)
I called patient and lvm in regards to her message and advise from Dr. Logan Bores, I also sent message via mychart.

## 2021-08-09 DIAGNOSIS — Z6834 Body mass index (BMI) 34.0-34.9, adult: Secondary | ICD-10-CM | POA: Diagnosis not present

## 2021-08-09 DIAGNOSIS — K5010 Crohn's disease of large intestine without complications: Secondary | ICD-10-CM | POA: Diagnosis not present

## 2021-08-09 DIAGNOSIS — R1013 Epigastric pain: Secondary | ICD-10-CM | POA: Diagnosis not present

## 2021-08-23 ENCOUNTER — Ambulatory Visit (INDEPENDENT_AMBULATORY_CARE_PROVIDER_SITE_OTHER): Payer: BC Managed Care – PPO | Admitting: Obstetrics and Gynecology

## 2021-08-23 ENCOUNTER — Other Ambulatory Visit: Payer: Self-pay

## 2021-08-23 ENCOUNTER — Telehealth: Payer: Self-pay

## 2021-08-23 ENCOUNTER — Encounter: Payer: Self-pay | Admitting: Obstetrics and Gynecology

## 2021-08-23 VITALS — BP 128/87 | HR 72 | Resp 16 | Ht 59.0 in | Wt 169.9 lb

## 2021-08-23 DIAGNOSIS — Z30011 Encounter for initial prescription of contraceptive pills: Secondary | ICD-10-CM

## 2021-08-23 DIAGNOSIS — Z3009 Encounter for other general counseling and advice on contraception: Secondary | ICD-10-CM

## 2021-08-23 MED ORDER — NORETHINDRONE 0.35 MG PO TABS: 1.0000 | ORAL_TABLET | Freq: Every day | ORAL | 4 refills | Status: DC

## 2021-08-23 NOTE — Telephone Encounter (Signed)
Pt is s/p c/s approx 6 weeks ago. Delivery was complicated by PP hemorrhage and blood transfusion x 2.  Pt states she started bleeding 3 days ago with cramps. No clots. She has been changing every 30 minutes to an hour for the last 48 hours. NO h/a or dizziness.  She is waking up at night to change her clothes. Tylenol for cramps is helping.   6 Week PPV move to today at 3pm.  DJE aware.

## 2021-08-23 NOTE — Progress Notes (Signed)
HPI:      [name] is a 25 y.o. G1P1001 who LMP was No LMP recorded (lmp unknown).  Subjective:   She presents today for her 6-week postpartum visit.  She states that her bleeding was light for the last several weeks but on Saturday she began having heavy bleeding with significant clotting.  She says today it has started to taper off.  She would like to discuss birth control methods today.  She reports no other postpartum issues.     Hx: The following portions of the patient's history were reviewed and updated as appropriate:             She  has a past medical history of Crohn disease (HCC) and PCOS (polycystic ovarian syndrome). She does not have any pertinent problems on file. She  has a past surgical history that includes Colectomy; Appendectomy; Colostomy; Colostomy reversal; and Cesarean section (07/15/2021). Her family history includes Asthma in her father; Diabetes in her maternal grandmother; Healthy in her mother. She  reports that she has never smoked. She has never used smokeless tobacco. She reports that she does not drink alcohol and does not use drugs. She has a current medication list which includes the following prescription(s): pantoprazole, prenate pixie, sucralfate, and norethindrone. She has No Known Allergies.       Review of Systems:  Review of Systems  Constitutional: Denied constitutional symptoms, night sweats, recent illness, fatigue, fever, insomnia and weight loss.  Eyes: Denied eye symptoms, eye pain, photophobia, vision change and visual disturbance.  Ears/Nose/Throat/Neck: Denied ear, nose, throat or neck symptoms, hearing loss, nasal discharge, sinus congestion and sore throat.  Cardiovascular: Denied cardiovascular symptoms, arrhythmia, chest pain/pressure, edema, exercise intolerance, orthopnea and palpitations.  Respiratory: Denied pulmonary symptoms, asthma, pleuritic pain, productive sputum, cough, dyspnea and wheezing.  Gastrointestinal:  Denied, gastro-esophageal reflux, melena, nausea and vomiting.  Genitourinary: See HPI for additional information.  Musculoskeletal: Denied musculoskeletal symptoms, stiffness, swelling, muscle weakness and myalgia.  Dermatologic: Denied dermatology symptoms, rash and scar.  Neurologic: Denied neurology symptoms, dizziness, headache, neck pain and syncope.  Psychiatric: Denied psychiatric symptoms, anxiety and depression.  Endocrine: Denied endocrine symptoms including hot flashes and night sweats.   Meds:   Current Outpatient Medications on File Prior to Visit  Medication Sig Dispense Refill   pantoprazole (PROTONIX) 20 MG tablet Take 1 tablet (20 mg total) by mouth daily. 30 tablet 1   Prenat-FeAsp-Meth-FA-DHA w/o A (PRENATE PIXIE) 10-0.6-0.4-200 MG CAPS Take 1 tablet by mouth daily. 30 capsule 11   sucralfate (CARAFATE) 1 g tablet Take 1 tablet (1 g total) by mouth 4 (four) times daily. 60 tablet 0   No current facility-administered medications on file prior to visit.      Objective:     Vitals:   08/23/21 1516  BP: 128/87  Pulse: 72  Resp: 16   Filed Weights   08/23/21 1516  Weight: 169 lb 14.4 oz (77.1 kg)              Because of her moderately menstrual bleeding patient declined examination today.          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 Birth control counseling   2. Postpartum care and examination immediately after delivery   3. Initiation of OCP (BCP)     This is her likely her first menstrual period after delivery.  She is experiencing more bleeding and cramping than usual. Of significant note patient has a past history of PCO.   Plan:            1.  We have discussed her bleeding and she has decided to begin Micronor at this time both for birth control and for cycle control.  She is not sure if she would like to stay on Micronor long-term. We have also discussed use of IUD and regular OCPs.  The advantage of regular OCPs after discontinuation of breast-feeding and in light of her PCO was discussed.  She is not yet sure which of these birth control/cycle control methods she would prefer.  Orders No orders of the defined types were placed in this encounter.    Meds ordered this encounter  Medications   norethindrone (MICRONOR) 0.35 MG tablet    Sig: Take 1 tablet (0.35 mg total) by mouth daily.    Dispense:  28 tablet    Refill:  4       F/U  Return in about 4 months (around 12/21/2021) for Pt to contact us if symptoms worsen, Annual Physical.  Finis Bud, M.D. 08/23/2021 3:41 PM

## 2021-08-25 ENCOUNTER — Encounter: Payer: BC Managed Care – PPO | Admitting: Obstetrics and Gynecology

## 2021-08-26 ENCOUNTER — Encounter: Payer: BC Managed Care – PPO | Admitting: Obstetrics and Gynecology

## 2021-09-17 DIAGNOSIS — K50919 Crohn's disease, unspecified, with unspecified complications: Secondary | ICD-10-CM | POA: Diagnosis not present

## 2021-11-05 ENCOUNTER — Encounter: Payer: Self-pay | Admitting: Emergency Medicine

## 2021-11-05 ENCOUNTER — Other Ambulatory Visit: Payer: Self-pay

## 2021-11-05 DIAGNOSIS — Z79899 Other long term (current) drug therapy: Secondary | ICD-10-CM

## 2021-11-05 DIAGNOSIS — E611 Iron deficiency: Secondary | ICD-10-CM | POA: Diagnosis not present

## 2021-11-05 DIAGNOSIS — Z6834 Body mass index (BMI) 34.0-34.9, adult: Secondary | ICD-10-CM | POA: Diagnosis not present

## 2021-11-05 DIAGNOSIS — Z9049 Acquired absence of other specified parts of digestive tract: Secondary | ICD-10-CM | POA: Diagnosis not present

## 2021-11-05 DIAGNOSIS — K5090 Crohn's disease, unspecified, without complications: Secondary | ICD-10-CM | POA: Diagnosis not present

## 2021-11-05 DIAGNOSIS — D649 Anemia, unspecified: Secondary | ICD-10-CM | POA: Diagnosis not present

## 2021-11-05 DIAGNOSIS — K219 Gastro-esophageal reflux disease without esophagitis: Secondary | ICD-10-CM | POA: Diagnosis not present

## 2021-11-05 DIAGNOSIS — Z79620 Long term (current) use of immunosuppressive biologic: Secondary | ICD-10-CM

## 2021-11-05 DIAGNOSIS — K8010 Calculus of gallbladder with chronic cholecystitis without obstruction: Secondary | ICD-10-CM | POA: Diagnosis not present

## 2021-11-05 DIAGNOSIS — K8020 Calculus of gallbladder without cholecystitis without obstruction: Secondary | ICD-10-CM | POA: Diagnosis not present

## 2021-11-05 DIAGNOSIS — K8051 Calculus of bile duct without cholangitis or cholecystitis with obstruction: Secondary | ICD-10-CM | POA: Diagnosis not present

## 2021-11-05 DIAGNOSIS — E669 Obesity, unspecified: Secondary | ICD-10-CM | POA: Diagnosis not present

## 2021-11-05 DIAGNOSIS — E871 Hypo-osmolality and hyponatremia: Secondary | ICD-10-CM | POA: Diagnosis not present

## 2021-11-05 DIAGNOSIS — R1011 Right upper quadrant pain: Secondary | ICD-10-CM | POA: Diagnosis not present

## 2021-11-05 DIAGNOSIS — K8044 Calculus of bile duct with chronic cholecystitis without obstruction: Secondary | ICD-10-CM | POA: Diagnosis not present

## 2021-11-05 DIAGNOSIS — K8021 Calculus of gallbladder without cholecystitis with obstruction: Secondary | ICD-10-CM | POA: Diagnosis not present

## 2021-11-05 DIAGNOSIS — R7401 Elevation of levels of liver transaminase levels: Secondary | ICD-10-CM | POA: Diagnosis present

## 2021-11-05 DIAGNOSIS — E876 Hypokalemia: Secondary | ICD-10-CM | POA: Diagnosis present

## 2021-11-05 DIAGNOSIS — E282 Polycystic ovarian syndrome: Secondary | ICD-10-CM | POA: Diagnosis present

## 2021-11-05 DIAGNOSIS — K8064 Calculus of gallbladder and bile duct with chronic cholecystitis without obstruction: Secondary | ICD-10-CM | POA: Diagnosis not present

## 2021-11-05 DIAGNOSIS — K8050 Calculus of bile duct without cholangitis or cholecystitis without obstruction: Secondary | ICD-10-CM | POA: Diagnosis not present

## 2021-11-05 DIAGNOSIS — Z20822 Contact with and (suspected) exposure to covid-19: Secondary | ICD-10-CM | POA: Diagnosis not present

## 2021-11-05 DIAGNOSIS — J4520 Mild intermittent asthma, uncomplicated: Secondary | ICD-10-CM | POA: Diagnosis not present

## 2021-11-05 DIAGNOSIS — R9431 Abnormal electrocardiogram [ECG] [EKG]: Secondary | ICD-10-CM | POA: Diagnosis not present

## 2021-11-05 LAB — URINALYSIS, ROUTINE W REFLEX MICROSCOPIC
Glucose, UA: NEGATIVE mg/dL
Hgb urine dipstick: NEGATIVE
Ketones, ur: NEGATIVE mg/dL
Leukocytes,Ua: NEGATIVE
Nitrite: NEGATIVE
Specific Gravity, Urine: >1.03 — ABNORMAL HIGH (ref 1.005–1.030)
pH: 5.5 (ref 5.0–8.0)

## 2021-11-05 LAB — COMPREHENSIVE METABOLIC PANEL
ALT: 363 U/L — ABNORMAL HIGH (ref 0–44)
AST: 451 U/L — ABNORMAL HIGH (ref 15–41)
Albumin: 3.5 g/dL (ref 3.5–5.0)
Alkaline Phosphatase: 160 U/L — ABNORMAL HIGH (ref 38–126)
Anion gap: 6 (ref 5–15)
BUN: 13 mg/dL (ref 6–20)
CO2: 23 mmol/L (ref 22–32)
Calcium: 8.6 mg/dL — ABNORMAL LOW (ref 8.9–10.3)
Chloride: 104 mmol/L (ref 98–111)
Creatinine, Ser: 0.68 mg/dL (ref 0.44–1.00)
GFR, Estimated: >60 mL/min (ref >60)
Glucose, Bld: 108 mg/dL — ABNORMAL HIGH (ref 70–99)
Potassium: 3.4 mmol/L — ABNORMAL LOW (ref 3.5–5.1)
Sodium: 133 mmol/L — ABNORMAL LOW (ref 135–145)
Total Bilirubin: 1.4 mg/dL — ABNORMAL HIGH (ref 0.3–1.2)
Total Protein: 8 g/dL (ref 6.5–8.1)

## 2021-11-05 LAB — CBC
HCT: 33.7 % — ABNORMAL LOW (ref 36.0–46.0)
Hemoglobin: 10.1 g/dL — ABNORMAL LOW (ref 12.0–15.0)
MCH: 21 pg — ABNORMAL LOW (ref 26.0–34.0)
MCHC: 30 g/dL (ref 30.0–36.0)
MCV: 70.1 fL — ABNORMAL LOW (ref 80.0–100.0)
Platelets: 351 10*3/uL (ref 150–400)
RBC: 4.81 MIL/uL (ref 3.87–5.11)
RDW: 16.5 % — ABNORMAL HIGH (ref 11.5–15.5)
WBC: 7.4 10*3/uL (ref 4.0–10.5)
nRBC: 0 % (ref 0.0–0.2)

## 2021-11-05 LAB — URINALYSIS, MICROSCOPIC (REFLEX): Bacteria, UA: NONE SEEN

## 2021-11-05 LAB — LIPASE, BLOOD: Lipase: 49 U/L (ref 11–51)

## 2021-11-05 LAB — POC URINE PREG, ED: Preg Test, Ur: NEGATIVE

## 2021-11-05 MED ORDER — ONDANSETRON 4 MG PO TBDP
4.0000 mg | ORAL_TABLET | Freq: Once | ORAL | Status: AC | PRN
  Administered 2021-11-05: 4 mg via ORAL
  Filled 2021-11-05: qty 1

## 2021-11-05 MED ORDER — OXYCODONE-ACETAMINOPHEN 5-325 MG PO TABS
1.0000 | ORAL_TABLET | ORAL | Status: AC | PRN
  Administered 2021-11-05 – 2021-11-06 (×2): 1 via ORAL
  Filled 2021-11-05 (×2): qty 1

## 2021-11-05 NOTE — ED Triage Notes (Signed)
Pt states upper abd pain that radiates to back today. Pt states emesis x1 today, denies known diarrhea or fever.

## 2021-11-06 ENCOUNTER — Encounter: Payer: Self-pay | Admitting: Family Medicine

## 2021-11-06 ENCOUNTER — Inpatient Hospital Stay
Admission: EM | Admit: 2021-11-06 | Discharge: 2021-11-09 | DRG: 418 | Disposition: A | Discharge status: Home / Self Care | Payer: Medicaid Other | Attending: Internal Medicine | Admitting: Internal Medicine

## 2021-11-06 ENCOUNTER — Emergency Department: Payer: Medicaid Other

## 2021-11-06 DIAGNOSIS — Z79620 Long term (current) use of immunosuppressive biologic: Secondary | ICD-10-CM | POA: Diagnosis not present

## 2021-11-06 DIAGNOSIS — E611 Iron deficiency: Secondary | ICD-10-CM | POA: Diagnosis present

## 2021-11-06 DIAGNOSIS — E876 Hypokalemia: Secondary | ICD-10-CM | POA: Diagnosis present

## 2021-11-06 DIAGNOSIS — K8044 Calculus of bile duct with chronic cholecystitis without obstruction: Secondary | ICD-10-CM | POA: Diagnosis present

## 2021-11-06 DIAGNOSIS — K8051 Calculus of bile duct without cholangitis or cholecystitis with obstruction: Secondary | ICD-10-CM | POA: Diagnosis not present

## 2021-11-06 DIAGNOSIS — K8021 Calculus of gallbladder without cholecystitis with obstruction: Secondary | ICD-10-CM

## 2021-11-06 DIAGNOSIS — K5090 Crohn's disease, unspecified, without complications: Secondary | ICD-10-CM | POA: Diagnosis present

## 2021-11-06 DIAGNOSIS — Z6834 Body mass index (BMI) 34.0-34.9, adult: Secondary | ICD-10-CM | POA: Diagnosis not present

## 2021-11-06 DIAGNOSIS — K219 Gastro-esophageal reflux disease without esophagitis: Secondary | ICD-10-CM

## 2021-11-06 DIAGNOSIS — Z9049 Acquired absence of other specified parts of digestive tract: Secondary | ICD-10-CM | POA: Diagnosis not present

## 2021-11-06 DIAGNOSIS — R9431 Abnormal electrocardiogram [ECG] [EKG]: Secondary | ICD-10-CM | POA: Diagnosis not present

## 2021-11-06 DIAGNOSIS — D649 Anemia, unspecified: Secondary | ICD-10-CM | POA: Diagnosis present

## 2021-11-06 DIAGNOSIS — Z79899 Other long term (current) drug therapy: Secondary | ICD-10-CM | POA: Diagnosis not present

## 2021-11-06 DIAGNOSIS — K8020 Calculus of gallbladder without cholecystitis without obstruction: Secondary | ICD-10-CM | POA: Diagnosis not present

## 2021-11-06 DIAGNOSIS — E871 Hypo-osmolality and hyponatremia: Secondary | ICD-10-CM | POA: Diagnosis present

## 2021-11-06 DIAGNOSIS — J4520 Mild intermittent asthma, uncomplicated: Secondary | ICD-10-CM

## 2021-11-06 DIAGNOSIS — R7401 Elevation of levels of liver transaminase levels: Secondary | ICD-10-CM | POA: Diagnosis present

## 2021-11-06 DIAGNOSIS — K8064 Calculus of gallbladder and bile duct with chronic cholecystitis without obstruction: Secondary | ICD-10-CM | POA: Diagnosis not present

## 2021-11-06 DIAGNOSIS — R1011 Right upper quadrant pain: Secondary | ICD-10-CM | POA: Diagnosis not present

## 2021-11-06 DIAGNOSIS — K5010 Crohn's disease of large intestine without complications: Secondary | ICD-10-CM | POA: Diagnosis present

## 2021-11-06 DIAGNOSIS — K8050 Calculus of bile duct without cholangitis or cholecystitis without obstruction: Secondary | ICD-10-CM | POA: Diagnosis not present

## 2021-11-06 DIAGNOSIS — Z20822 Contact with and (suspected) exposure to covid-19: Secondary | ICD-10-CM | POA: Diagnosis present

## 2021-11-06 DIAGNOSIS — E282 Polycystic ovarian syndrome: Secondary | ICD-10-CM | POA: Diagnosis present

## 2021-11-06 DIAGNOSIS — E669 Obesity, unspecified: Secondary | ICD-10-CM | POA: Diagnosis present

## 2021-11-06 DIAGNOSIS — R1010 Upper abdominal pain, unspecified: Secondary | ICD-10-CM

## 2021-11-06 LAB — RESP PANEL BY RT-PCR (FLU A&B, COVID) ARPGX2
Influenza A by PCR: NEGATIVE
Influenza B by PCR: NEGATIVE
SARS Coronavirus 2 by RT PCR: NEGATIVE

## 2021-11-06 MED ORDER — ACETAMINOPHEN 650 MG RE SUPP: 650.0000 mg | Freq: Four times a day (QID) | RECTAL | Status: DC | PRN

## 2021-11-06 MED ORDER — ACETAMINOPHEN 325 MG PO TABS
650.0000 mg | ORAL_TABLET | Freq: Four times a day (QID) | ORAL | Status: DC | PRN
  Administered 2021-11-07: 650 mg via ORAL
  Filled 2021-11-06: qty 2

## 2021-11-06 MED ORDER — ENOXAPARIN SODIUM 40 MG/0.4ML IJ SOSY
40.0000 mg | PREFILLED_SYRINGE | INTRAMUSCULAR | Status: DC
  Administered 2021-11-06 – 2021-11-09 (×2): 40 mg via SUBCUTANEOUS
  Filled 2021-11-06 (×2): qty 0.4

## 2021-11-06 MED ORDER — CEFAZOLIN SODIUM-DEXTROSE 2-4 GM/100ML-% IV SOLN: 2.0000 g | INTRAVENOUS | Status: DC

## 2021-11-06 MED ORDER — LACTATED RINGERS IV BOLUS
1000.0000 mL | Freq: Once | INTRAVENOUS | Status: AC
Start: 2021-11-06 — End: 2021-11-06
  Administered 2021-11-06: 1000 mL via INTRAVENOUS

## 2021-11-06 MED ORDER — POTASSIUM CHLORIDE IN NACL 20-0.9 MEQ/L-% IV SOLN
INTRAVENOUS | Status: DC
  Filled 2021-11-06 (×11): qty 1000

## 2021-11-06 MED ORDER — ONDANSETRON HCL 4 MG PO TABS: 4.0000 mg | ORAL_TABLET | Freq: Four times a day (QID) | ORAL | Status: DC | PRN

## 2021-11-06 MED ORDER — PRENATAL MULTIVITAMIN CH
1.0000 | ORAL_TABLET | Freq: Every day | ORAL | Status: DC
  Administered 2021-11-06 – 2021-11-09 (×3): 1 via ORAL
  Filled 2021-11-06 (×4): qty 1

## 2021-11-06 MED ORDER — TRAZODONE HCL 50 MG PO TABS: 25.0000 mg | ORAL_TABLET | Freq: Every evening | ORAL | Status: DC | PRN

## 2021-11-06 MED ORDER — CEFAZOLIN (ANCEF) 1 G IV SOLR: 2.0000 g | INTRAVENOUS | Status: DC

## 2021-11-06 MED ORDER — GADOBUTROL 1 MMOL/ML IV SOLN
7.0000 mL | Freq: Once | INTRAVENOUS | Status: AC | PRN
  Administered 2021-11-06: 7 mL via INTRAVENOUS
  Filled 2021-11-06: qty 7.5

## 2021-11-06 MED ORDER — SODIUM CHLORIDE 0.9 % IV SOLN
Freq: Once | INTRAVENOUS | Status: AC
Start: 2021-11-06 — End: 2021-11-06

## 2021-11-06 MED ORDER — MORPHINE SULFATE (PF) 4 MG/ML IV SOLN
4.0000 mg | Freq: Once | INTRAVENOUS | Status: AC
  Administered 2021-11-06: 4 mg via INTRAVENOUS
  Filled 2021-11-06: qty 1

## 2021-11-06 MED ORDER — CEFAZOLIN SODIUM-DEXTROSE 2-4 GM/100ML-% IV SOLN
2.0000 g | INTRAVENOUS | Status: AC
  Filled 2021-11-06 (×2): qty 100

## 2021-11-06 MED ORDER — PANTOPRAZOLE SODIUM 20 MG PO TBEC
20.0000 mg | DELAYED_RELEASE_TABLET | Freq: Every day | ORAL | Status: DC
  Administered 2021-11-06 – 2021-11-09 (×4): 20 mg via ORAL
  Filled 2021-11-06 (×4): qty 1

## 2021-11-06 MED ORDER — SUCRALFATE 1 G PO TABS
1.0000 g | ORAL_TABLET | Freq: Four times a day (QID) | ORAL | Status: DC
  Administered 2021-11-06 – 2021-11-09 (×9): 1 g via ORAL
  Filled 2021-11-06 (×10): qty 1

## 2021-11-06 MED ORDER — ONDANSETRON HCL 4 MG/2ML IJ SOLN
4.0000 mg | INTRAMUSCULAR | Status: AC
  Administered 2021-11-06: 4 mg via INTRAVENOUS
  Filled 2021-11-06: qty 2

## 2021-11-06 MED ORDER — KETOROLAC TROMETHAMINE 30 MG/ML IJ SOLN
15.0000 mg | Freq: Once | INTRAMUSCULAR | Status: AC
Start: 2021-11-06 — End: 2021-11-06
  Administered 2021-11-06: 15 mg via INTRAVENOUS
  Filled 2021-11-06: qty 1

## 2021-11-06 MED ORDER — NORETHINDRONE 0.35 MG PO TABS: 1.0000 | ORAL_TABLET | Freq: Every day | ORAL | Status: DC

## 2021-11-06 MED ORDER — INDOCYANINE GREEN 25 MG IV SOLR
2.5000 mg | Freq: Once | INTRAVENOUS | Status: AC
  Administered 2021-11-08: 2.5 mg via INTRAVENOUS
  Filled 2021-11-06: qty 1

## 2021-11-06 MED ORDER — ONDANSETRON HCL 4 MG/2ML IJ SOLN
4.0000 mg | Freq: Four times a day (QID) | INTRAMUSCULAR | Status: DC | PRN
  Administered 2021-11-06: 4 mg via INTRAVENOUS
  Filled 2021-11-06: qty 2

## 2021-11-06 MED ORDER — MORPHINE SULFATE (PF) 4 MG/ML IV SOLN
4.0000 mg | Freq: Once | INTRAVENOUS | Status: AC
  Administered 2021-11-07: 4 mg via INTRAVENOUS
  Filled 2021-11-06: qty 1

## 2021-11-06 MED ORDER — POLYETHYLENE GLYCOL 3350 17 G PO PACK: 17.0000 g | PACK | Freq: Every day | ORAL | Status: DC | PRN

## 2021-11-06 NOTE — H&P (Addendum)
[place]   PATIENT NAME: Sandra Wilkerson    MR#:  [id number]  DATE OF BIRTH:  [birth year]  DATE OF ADMISSION:  11/06/2021  PRIMARY CARE PHYSICIAN: Danelle Berry, NP   Patient is coming from: Home  REQUESTING/REFERRING PHYSICIAN: Hinda Kehr, MD  CHIEF COMPLAINT:   Chief Complaint  Patient presents with   Abdominal Pain    HISTORY OF PRESENT ILLNESS:  Sandra Wilkerson is a 26 y.o. obese African-American female with medical history significant for Crohn's disease, GERD, asthma and polycystic ovarian disease, who presented to the ER with acute onset of severe upper abdominal pain with radiation to her back.  She admitted to nausea and vomited once.  She had diarrhea for a few bowel movements that were loose.  No bilious vomitus or hematemesis.  No melena or bright red bleeding per rectum.  This was exacerbated by food and has happened to her before after she ate at night.  She denies any fever but she had cold chills.  No diarrhea or constipation.  No dysuria, oliguria or hematuria or flank pain.  No cough or wheezing or dyspnea.  No chest pain or palpitations.  ED Course: When she came to the ER vital signs were within normal.  Labs revealed mild hyponatremia 133 and hypokalemia of 3.4 with a calcium of 8.6.  Alk phos was 160 AST 451 and ALT 363 with total bili of 1.4.  CBC showed anemia with hemoglobin of 10.9 hematocrit 33.7 slightly lower than previous levels with low RBC indicis.  Urine pregnancy test was negative. EKG as reviewed by me : Showed normal sinus rhythm with a rate of 79 with sinus arrhythmia and T wave inversion inferiorly Imaging: Right upper quadrant ultrasound revealed cholelithiasis with positive sonographic Murphy sign.  Common bile duct was dilated to 8 mm with suspected mild intrahepatic ductal dilatation. MRCP revealed cholelithiasis and choledocholithiasis with no evidence of cholecystitis at this time.  There was very mild intra and extrahepatic  biliary dilatation including obstruction at the level of distal common bile duct from choledocholithiasis.  The patient was given 4 mg of IV Zofran twice 4 mg of IV morphine sulfate as well as 1 L bolus of IV lactated Ringer and 50 mg of IV Toradol.  She will be admitted to a medical bed for further evaluation and management. PAST MEDICAL HISTORY:   Past Medical History:  Diagnosis Date   Crohn disease ([place])    PCOS (polycystic ovarian syndrome)   -Asthma  PAST SURGICAL HISTORY:   Past Surgical History:  Procedure Laterality Date   APPENDECTOMY     CESAREAN SECTION  07/15/2021   Procedure: CESAREAN SECTION;  Surgeon: Harlin Heys, MD;  Location: [facility] ORS;  Service: Obstetrics;;   COLECTOMY     COLOSTOMY     COLOSTOMY REVERSAL      SOCIAL HISTORY:   Social History   Tobacco Use   Smoking status: Never   Smokeless tobacco: Never  Substance Use Topics   Alcohol use: No    FAMILY HISTORY:   Family History  Problem Relation Age of Onset   Healthy Mother    Asthma Father    Diabetes Maternal Grandmother     DRUG ALLERGIES:  No Known Allergies  REVIEW OF SYSTEMS:   ROS As per history of present illness. All pertinent systems were reviewed above. Constitutional, HEENT, cardiovascular, respiratory, GI, GU, musculoskeletal, neuro, psychiatric, endocrine, integumentary and hematologic systems were reviewed and are otherwise  negative/unremarkable except for positive findings mentioned above in the HPI.   MEDICATIONS AT HOME:   Prior to Admission medications   Medication Sig Start Date End Date Taking? Authorizing Provider  norethindrone (MICRONOR) 0.35 MG tablet Take 1 tablet (0.35 mg total) by mouth daily. 08/23/21   Harlin Heys, MD  pantoprazole (PROTONIX) 20 MG tablet Take 1 tablet (20 mg total) by mouth daily. 08/04/21 08/04/22  Nance Pear, MD  Prenat-FeAsp-Meth-FA-DHA w/o A (PRENATE PIXIE) 10-0.6-0.4-200 MG CAPS Take 1 tablet by mouth daily.  12/21/20   Rubie Maid, MD  sucralfate (CARAFATE) 1 g tablet Take 1 tablet (1 g total) by mouth 4 (four) times daily. 08/04/21   Nance Pear, MD      VITAL SIGNS:  Blood pressure 118/63, pulse 100, temperature 98 F (36.7 C), temperature source Oral, resp. rate 16, height $RemoveBe'4\' 11"'wukupzGif$  (1.499 m), weight 77.1 kg, last menstrual period 10/08/2021, SpO2 100 %, not currently breastfeeding.  PHYSICAL EXAMINATION:  Physical Exam  GENERAL:  26 y.o.-year-old African-American female patient lying in the bed with no acute distress.  EYES: Pupils equal, round, reactive to light and accommodation. No scleral icterus. Extraocular muscles intact.  HEENT: Head atraumatic, normocephalic. Oropharynx and nasopharynx clear.  NECK:  Supple, no jugular venous distention. No thyroid enlargement, no tenderness.  LUNGS: Normal breath sounds bilaterally, no wheezing, rales,rhonchi or crepitation. No use of accessory muscles of respiration.  CARDIOVASCULAR: Regular rate and rhythm, S1, S2 normal. No murmurs, rubs, or gallops.  ABDOMEN: Soft, nondistended, with right upper quadrant tenderness without rebound tenderness guarding or rigidity.  Bowel sounds present. No organomegaly or mass.  EXTREMITIES: No pedal edema, cyanosis, or clubbing.  NEUROLOGIC: Cranial nerves II through XII are intact. Muscle strength 5/5 in all extremities. Sensation intact. Gait not checked.  PSYCHIATRIC: The patient is alert and oriented x 3.  Normal affect and good eye contact. SKIN: No obvious rash, lesion, or ulcer.   LABORATORY PANEL:   CBC Recent Labs  Lab 11/05/21 2253  WBC 7.4  HGB 10.1*  HCT 33.7*  PLT 351   ------------------------------------------------------------------------------------------------------------------  Chemistries  Recent Labs  Lab 11/05/21 2253  NA 133*  K 3.4*  CL 104  CO2 23  GLUCOSE 108*  BUN 13  CREATININE 0.68  CALCIUM 8.6*  AST 451*  ALT 363*  ALKPHOS 160*  BILITOT 1.4*    ------------------------------------------------------------------------------------------------------------------  Cardiac Enzymes No results for input(s): TROPONINI in the last 168 hours. ------------------------------------------------------------------------------------------------------------------  RADIOLOGY:  MR ABDOMEN MRCP W WO CONTAST  Result Date: 11/06/2021 CLINICAL DATA:  26 year old female with history of right upper quadrant pain with cholelithiasis and biliary ductal dilatation noted on recent ultrasound examination. Follow-up study to evaluate for potential choledocholithiasis. EXAM: MRI ABDOMEN WITHOUT AND WITH CONTRAST (INCLUDING MRCP) TECHNIQUE: Multiplanar multisequence MR imaging of the abdomen was performed both before and after the administration of intravenous contrast. Heavily T2-weighted images of the biliary and pancreatic ducts were obtained, and three-dimensional MRCP images were rendered by post processing. CONTRAST:  79mL GADAVIST GADOBUTROL 1 MMOL/ML IV SOLN COMPARISON:  No prior abdominal MRI. Abdominal ultrasound 11/06/2021. CT of the abdomen and pelvis 08/04/2021. FINDINGS: Lower chest: Unremarkable. Hepatobiliary: No suspicious cystic or solid hepatic lesions. Mild intra and extrahepatic biliary ductal dilatation noted on MRCP images. Common bile duct measures up to 8 mm in the porta hepatis. There is a tiny filling defect in the distal common bile duct (axial image 21 of series 4 and MRCP image 61 of series 13), compatible with choledocholithiasis,  immediately before the level of the ampulla. Numerous tiny filling defects are also noted within the gallbladder, compatible with gallstones. Gallbladder is only moderately distended. Gallbladder wall thickness is normal. No significant volume of pericholecystic fluid or surrounding inflammatory changes. Pancreas: No pancreatic mass. No pancreatic ductal dilatation. No pancreatic or peripancreatic fluid collections or  inflammatory changes. Spleen:  Unremarkable. Adrenals/Urinary Tract: Bilateral kidneys and adrenal glands are normal in appearance. No hydroureteronephrosis in the visualized portions of the abdomen. Bilateral adrenal glands are normal in appearance. Stomach/Bowel: Unremarkable. Vascular/Lymphatic: No aneurysm identified in the visualized abdominal vasculature. No lymphadenopathy noted in the abdomen. Other: No significant volume of ascites noted in the visualized portions of the peritoneal cavity. Musculoskeletal: No aggressive appearing osseous lesions are noted in the visualized portions of the skeleton. IMPRESSION: 1. Study is positive for cholelithiasis and choledocholithiasis. No evidence of cholecystitis at this time. However, there is very mild intra and extrahepatic biliary ductal dilatation indicating obstruction at the level of the distal common bile duct from choledocholithiasis. Electronically Signed   By: Vinnie Langton M.D.   On: 11/06/2021 05:12   US ABDOMEN LIMITED RUQ (LIVER/GB)  Result Date: 11/06/2021 CLINICAL DATA:  Right upper quadrant abdominal pain EXAM: ULTRASOUND ABDOMEN LIMITED RIGHT UPPER QUADRANT COMPARISON:  None. FINDINGS: Gallbladder: Layering gallstones. No gallbladder wall thickening or pericholecystic fluid. Positive sonographic Murphy's sign. Common bile duct: Diameter: 8 mm Liver: No focal lesion identified. Within normal limits in parenchymal echogenicity. Suspected mild intrahepatic ductal dilatation. Portal vein is patent on color Doppler imaging with normal direction of blood flow towards the liver. Other: None. IMPRESSION: Cholelithiasis with positive sonographic Murphy's sign. However, there are no additional grayscale sonographic findings to suggest acute cholecystitis. Dilated common duct, measuring 8 mm. Suspected mild intrahepatic ductal dilatation. This appearance raises the possibility of choledocholithiasis. Correlate with LFTs and consider MRCP or ERCP as  clinically warranted. Electronically Signed   By: Julian Hy M.D.   On: 11/06/2021 01:49      IMPRESSION AND PLAN:  Principal Problem:   Choledocholithiasis  1.  Obstructive choledocholithiasis  with associated cholelithiasis. - The patient will be admitted to a medical bed. - Pain management will be provided. - We will follow LFTs with hydration. - We will place on for now quit liquids. - GI consult will be obtained. - Dr. Marius Ditch was notified about the patient and accepted the admission knowing that  Dr.  Allen Norris will be available tomorrow for ERCP. - We will obtain a general surgery consult. - I notified Dr. Duanne Limerick.  2.  GERD. - We will continue PPI therapy and Carafate.  3.  Asthma, currently controlled. - She will be placed on as needed DuoNebs.  4.  Crohn's disease in remission.  DVT prophylaxis: Lovenox. Code Status: full code. Family Communication:  The plan of care was discussed in details with the patient (and family). I answered all questions. The patient agreed to proceed with the above mentioned plan. Further management will depend upon hospital course. Disposition Plan: Back to previous home environment Consults called: Gastroenterology all the records are reviewed and case discussed with ED provider.  Status is: Inpatient  At the time of the admission, it appears that the appropriate admission status for this patient is inpatient.  This is judged to be reasonable and necessary in order to provide the required intensity of service to ensure the patient's safety given the presenting symptoms, physical exam findings and initial radiographic and laboratory data in the context of comorbid conditions.  The patient requires inpatient status due to high intensity of service, high risk of further deterioration and high frequency of surveillance required.  I certify that at the time of admission, it is my clinical judgment that the patient will require inpatient  hospital care extending more than 2 midnights.                            Dispo: The patient is from: Home              Anticipated d/c is to: Home              Patient currently is not medically stable to d/c.              Difficult to place patient: No    Christel Mormon M.D on 11/06/2021 at 5:55 AM  [facility]   From 7 PM-7 AM, contact night-coverage [email or web address]  CC: Primary care physician; Danelle Berry, NP

## 2021-11-06 NOTE — ED Notes (Signed)
Pt to mri 

## 2021-11-06 NOTE — Consult Note (Signed)
Cephas Darby, MD [address]  [place]  [place], [place] [address]  Main: [phone]  Fax: [phone] Pager: [phone]   Consultation  Referring Provider:     No ref. provider found Primary Care Physician:  Danelle Berry, NP Primary Gastroenterologist: Dr. Darrel Hoover at [facility]     Reason for Consultation:     Choledocholithiasis  Date of Admission:  11/06/2021 Date of Consultation:  11/06/2021         HPI:   Sandra Wilkerson is a 26 y.o. 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 Patient presented to ER yesterday secondary to acute onset of severe upper abdominal pain radiating to the back, 1 episode of vomiting.  Patient denies any fever, chills labs in the ER revealed mild hyponatremia and hypokalemia.  She had elevated LFTs alkaline phosphatase 160, AST 51, ALT 363, total bilirubin 1.4, her LFTs were normal 3 months ago.  Lipase is normal.  Mild anemia, no leukocytosis.  She underwent right upper ultrasound which revealed cholelithiasis with positive sonographic Murphy sign.  CBD was dilated to 8 mm with suspected mild intrahepatic ductal dilation.  She underwent MRCP revealed cholelithiasis and confirmed choledocholithiasis without any evidence of cholecystitis.  There was very mild intra and extrahepatic biliary dilatation including obstruction at the level of distal common bile duct from choledocholithiasis.  When I interviewed the patient, she complained of mild pain in the right upper quadrant only.  She is able to tolerate clear liquids.  Patient does not smoke or drink alcohol  GI is consulted for ERCP  NSAIDs: None  Antiplts/Anticoagulants/Anti thrombotics: None  GI Procedures: Upper endoscopy and colonoscopy 07/28/2020 Impression:            - Normal esophagus.                          - Normal stomach.                         - Flattened mucosa was found in the duodenum, rule out                         celiac disease/Crohn's disease. Biopsied.    - Patent end-to-end ileo-colonic anastomosis,                          characterized by healthy appearing mucosa.                         - The examined portion of the ileum was normal.                         - Pseudopolyps in the recto-sigmoid colon. Biopsied.                         - A single erosion in the rectum.   Diagnosis   [facility]  A: Small bowel, duodenum, biopsy - Duodenal mucosa with focal moderate villous blunting, patchy foveolar metaplasia, and focally minimally increased intraepithelial lymphocytes - No CMV viral cytopathic effect, granuloma, or dysplasia identified - See comment   B: Rectum, biopsy - Patchy  minimally active chronic colitis - Separate fragments of colorectal mucosa with mild crypt architectural disorder suggestive of chronic quiescent colitis - No CMV viral cytopathic effect, granuloma, or dysplasia identified    Past Medical History:  Diagnosis Date   Crohn disease ([place])    PCOS (polycystic ovarian syndrome)     Past Surgical History:  Procedure Laterality Date   APPENDECTOMY     CESAREAN SECTION  07/15/2021   Procedure: CESAREAN SECTION;  Surgeon: Harlin Heys, MD;  Location: [facility] ORS;  Service: Obstetrics;;   COLECTOMY     COLOSTOMY     COLOSTOMY REVERSAL      Prior to Admission medications   Medication Sig Start Date End Date Taking? Authorizing Provider  norethindrone (MICRONOR) 0.35 MG tablet Take 1 tablet (0.35 mg total) by mouth daily. Patient not taking: Reported on 11/06/2021 08/23/21   Harlin Heys, MD  pantoprazole (PROTONIX) 20 MG tablet Take 1 tablet (20 mg total) by mouth daily. Patient not taking: Reported on 11/06/2021 08/04/21 08/04/22  Nance Pear, MD  Prenat-FeAsp-Meth-FA-DHA w/o A (PRENATE PIXIE)  10-0.6-0.4-200 MG CAPS Take 1 tablet by mouth daily. Patient not taking: Reported on 11/06/2021 12/21/20   Rubie Maid, MD  sucralfate (CARAFATE) 1 g tablet Take 1 tablet (1 g total) by mouth 4 (four) times daily. Patient not taking: Reported on 11/06/2021 08/04/21   Nance Pear, MD    Current Facility-Administered Medications:    0.9 % NaCl with KCl 20 mEq/ L  infusion, , Intravenous, Continuous, Mansy, Arvella Merles, MD, Last Rate: 100 mL/hr at 11/06/21 0614, New Bag at 11/06/21 1610   acetaminophen (TYLENOL) tablet 650 mg, 650 mg, Oral, Q6H PRN **OR** acetaminophen (TYLENOL) suppository 650 mg, 650 mg, Rectal, Q6H PRN, Mansy, Jan A, MD   enoxaparin (LOVENOX) injection 40 mg, 40 mg, Subcutaneous, Q24H, Mansy, Jan A, MD, 40 mg at 11/06/21 1042   norethindrone (MICRONOR) 0.35 MG tablet 0.35 mg, 1 tablet, Oral, Daily, Mansy, Jan A, MD   ondansetron (ZOFRAN) tablet 4 mg, 4 mg, Oral, Q6H PRN **OR** ondansetron (ZOFRAN) injection 4 mg, 4 mg, Intravenous, Q6H PRN, Mansy, Jan A, MD   oxyCODONE-acetaminophen (PERCOCET/ROXICET) 5-325 MG per tablet 1 tablet, 1 tablet, Oral, Q30 min PRN, Hinda Kehr, MD, 1 tablet at 11/05/21 2309   pantoprazole (PROTONIX) EC tablet 20 mg, 20 mg, Oral, Daily, Mansy, Jan A, MD, 20 mg at 11/06/21 1042   polyethylene glycol (MIRALAX / GLYCOLAX) packet 17 g, 17 g, Oral, Daily PRN, Mansy, Jan A, MD   prenatal multivitamin tablet 1 tablet, 1 tablet, Oral, Daily, Mansy, Jan A, MD, 1 tablet at 11/06/21 1042   sucralfate (CARAFATE) tablet 1 g, 1 g, Oral, QID, Mansy, Jan A, MD, 1 g at 11/06/21 1042   traZODone (DESYREL) tablet 25 mg, 25 mg, Oral, QHS PRN, Mansy, Jan A, MD  Current Outpatient Medications:    norethindrone (MICRONOR) 0.35 MG tablet, Take 1 tablet (0.35 mg total) by mouth daily. (Patient not taking: Reported on 11/06/2021), Disp: 28 tablet, Rfl: 4   pantoprazole (PROTONIX) 20 MG tablet, Take 1 tablet (20 mg total) by mouth daily. (Patient not taking: Reported on  11/06/2021), Disp: 30 tablet, Rfl: 1   Prenat-FeAsp-Meth-FA-DHA w/o A (PRENATE PIXIE) 10-0.6-0.4-200 MG CAPS, Take 1 tablet by mouth daily. (Patient not taking: Reported on 11/06/2021), Disp: 30 capsule, Rfl: 11   sucralfate (CARAFATE) 1 g tablet, Take 1 tablet (1 g total) by mouth 4 (four) times daily. (Patient not taking: Reported on 11/06/2021), Disp:  60 tablet, Rfl: 0  Family History  Problem Relation Age of Onset   Healthy Mother    Asthma Father    Diabetes Maternal Grandmother      Social History   Tobacco Use   Smoking status: Never   Smokeless tobacco: Never  Vaping Use   Vaping Use: Never used  Substance Use Topics   Alcohol use: No   Drug use: No    Allergies as of 11/05/2021   (No Known Allergies)    Review of Systems:    All systems reviewed and negative except where noted in HPI.   Physical Exam:  Vital signs in last 24 hours: Temp:  [98 F (36.7 C)] 98 F (36.7 C) (01/28 2252) Pulse Rate:  [63-100] 77 (01/29 1200) Resp:  [16-18] 18 (01/29 1200) BP: (100-129)/(59-88) 127/81 (01/29 1200) SpO2:  [97 %-100 %] 100 % (01/29 1200) Weight:  [77.1 kg] 77.1 kg (01/28 2251)   General:   Pleasant, cooperative in NAD Head:  Normocephalic and atraumatic. Eyes:   No icterus.   Conjunctiva pink. PERRLA. Ears:  Normal auditory acuity. Neck:  Supple; no masses or thyroidomegaly Lungs: Respirations even and unlabored. Lungs clear to auscultation bilaterally.   No wheezes, crackles, or rhonchi.  Heart:  Regular rate and rhythm;  Without murmur, clicks, rubs or gallops Abdomen:  Soft, nondistended, mild right upper quadrant tenderness. Normal bowel sounds. No appreciable masses or hepatomegaly.  No rebound or guarding.  Rectal:  Not performed. Msk:  Symmetrical without gross deformities.  Strength normal Extremities:  Without edema, cyanosis or clubbing. Neurologic:  Alert and oriented x3;  grossly normal neurologically. Skin:  Intact without significant lesions or  rashes. Psych:  Alert and cooperative. Normal affect.  LAB RESULTS: CBC Latest Ref Rng & Units 11/05/2021 08/04/2021 07/17/2021  WBC 4.0 - 10.5 K/uL 7.4 7.5 -  Hemoglobin 12.0 - 15.0 g/dL 10.1(L) 11.2(L) 8.4(L)  Hematocrit 36.0 - 46.0 % 33.7(L) 34.7(L) 25.1(L)  Platelets 150 - 400 K/uL 351 483(H) -    BMET BMP Latest Ref Rng & Units 11/05/2021 08/04/2021 03/04/2018  Glucose 70 - 99 mg/dL 108(H) 93 102(H)  BUN 6 - 20 mg/dL $Remove'13 16 13  'maYSPlk$ Creatinine 0.44 - 1.00 mg/dL 0.68 0.67 0.71  Sodium 135 - 145 mmol/L 133(L) 137 136  Potassium 3.5 - 5.1 mmol/L 3.4(L) 3.6 3.8  Chloride 98 - 111 mmol/L 104 107 108  CO2 22 - 32 mmol/L $RemoveB'23 23 23  'vkMsmwsE$ Calcium 8.9 - 10.3 mg/dL 8.6(L) 8.8(L) 8.6(L)    LFT Hepatic Function Latest Ref Rng & Units 11/05/2021 08/04/2021  Total Protein 6.5 - 8.1 g/dL 8.0 7.6  Albumin 3.5 - 5.0 g/dL 3.5 3.5  AST 15 - 41 U/L 451(H) 19  ALT 0 - 44 U/L 363(H) 15  Alk Phosphatase 38 - 126 U/L 160(H) 95  Total Bilirubin 0.3 - 1.2 mg/dL 1.4(H) 0.6     STUDIES: MR ABDOMEN MRCP W WO CONTAST  Result Date: 11/06/2021 CLINICAL DATA:  26 year old female with history of right upper quadrant pain with cholelithiasis and biliary ductal dilatation noted on recent ultrasound examination. Follow-up study to evaluate for potential choledocholithiasis. EXAM: MRI ABDOMEN WITHOUT AND WITH CONTRAST (INCLUDING MRCP) TECHNIQUE: Multiplanar multisequence MR imaging of the abdomen was performed both before and after the administration of intravenous contrast. Heavily T2-weighted images of the biliary and pancreatic ducts were obtained, and three-dimensional MRCP images were rendered by post processing. CONTRAST:  61mL GADAVIST GADOBUTROL 1 MMOL/ML IV SOLN COMPARISON:  No prior  abdominal MRI. Abdominal ultrasound 11/06/2021. CT of the abdomen and pelvis 08/04/2021. FINDINGS: Lower chest: Unremarkable. Hepatobiliary: No suspicious cystic or solid hepatic lesions. Mild intra and extrahepatic biliary ductal dilatation  noted on MRCP images. Common bile duct measures up to 8 mm in the porta hepatis. There is a tiny filling defect in the distal common bile duct (axial image 21 of series 4 and MRCP image 61 of series 13), compatible with choledocholithiasis, immediately before the level of the ampulla. Numerous tiny filling defects are also noted within the gallbladder, compatible with gallstones. Gallbladder is only moderately distended. Gallbladder wall thickness is normal. No significant volume of pericholecystic fluid or surrounding inflammatory changes. Pancreas: No pancreatic mass. No pancreatic ductal dilatation. No pancreatic or peripancreatic fluid collections or inflammatory changes. Spleen:  Unremarkable. Adrenals/Urinary Tract: Bilateral kidneys and adrenal glands are normal in appearance. No hydroureteronephrosis in the visualized portions of the abdomen. Bilateral adrenal glands are normal in appearance. Stomach/Bowel: Unremarkable. Vascular/Lymphatic: No aneurysm identified in the visualized abdominal vasculature. No lymphadenopathy noted in the abdomen. Other: No significant volume of ascites noted in the visualized portions of the peritoneal cavity. Musculoskeletal: No aggressive appearing osseous lesions are noted in the visualized portions of the skeleton. IMPRESSION: 1. Study is positive for cholelithiasis and choledocholithiasis. No evidence of cholecystitis at this time. However, there is very mild intra and extrahepatic biliary ductal dilatation indicating obstruction at the level of the distal common bile duct from choledocholithiasis. Electronically Signed   By: Vinnie Langton M.D.   On: 11/06/2021 05:12   US ABDOMEN LIMITED RUQ (LIVER/GB)  Result Date: 11/06/2021 CLINICAL DATA:  Right upper quadrant abdominal pain EXAM: ULTRASOUND ABDOMEN LIMITED RIGHT UPPER QUADRANT COMPARISON:  None. FINDINGS: Gallbladder: Layering gallstones. No gallbladder wall thickening or pericholecystic fluid. Positive  sonographic Murphy's sign. Common bile duct: Diameter: 8 mm Liver: No focal lesion identified. Within normal limits in parenchymal echogenicity. Suspected mild intrahepatic ductal dilatation. Portal vein is patent on color Doppler imaging with normal direction of blood flow towards the liver. Other: None. IMPRESSION: Cholelithiasis with positive sonographic Murphy's sign. However, there are no additional grayscale sonographic findings to suggest acute cholecystitis. Dilated common duct, measuring 8 mm. Suspected mild intrahepatic ductal dilatation. This appearance raises the possibility of choledocholithiasis. Correlate with LFTs and consider MRCP or ERCP as clinically warranted. Electronically Signed   By: Julian Hy M.D.   On: 11/06/2021 01:49      Impression / Plan:   Sandra Wilkerson is a 26 y.o. female with history of Crohn's disease of the colon, left-sided colonic perforation, ileocolectomy with ileorectal anastomosis currently in clinical and histologic remission, maintained on infliximab 10 mg/kg body weight every 4 weeks is admitted with cholelithiasis and choledocholithiasis  Cholelithiasis and choledocholithiasis No evidence of gallstone pancreatitis or ascending cholangitis Okay with clear liquid diet Monitor for signs of infection N.p.o. effective 5 AM ERCP tomorrow by Dr. Allen Norris Hold Monday a.m. Lovenox dose Timing of cholecystectomy per general surgery  I have discussed alternative options, risks & benefits,  which include, but are not limited to, bleeding, infection, perforation,respiratory complication & drug reaction.  The patient agrees with this plan & written consent will be obtained.     Thank you for involving me in the care of this patient.  Dr. Allen Norris will cover from tomorrow    LOS: 0 days   Sherri Sear, MD  11/06/2021, 1:20 PM    Note: This dictation was prepared with Dragon dictation along with smaller phrase technology. Any  transcriptional errors that  result from this process are unintentional.

## 2021-11-06 NOTE — Progress Notes (Signed)
Hospital day 0 Saw and examined patient - no concerns, she reports doing better with treatment since arriving in hospital. ERCP planned for tomorrow. GI and Surgery following. No concerns at this time. Reviewed labs/imaging as below.     Results for orders placed or performed during the hospital encounter of 11/06/21 (from the past 48 hour(s))  Lipase, blood     Status: None   Collection Time: 11/05/21 10:53 PM  Result Value Ref Range   Lipase 49 11 - 51 U/L    Comment: Performed at [facility], [address] [place], [place] [address]  Comprehensive metabolic panel     Status: Abnormal   Collection Time: 11/05/21 10:53 PM  Result Value Ref Range   Sodium 133 (L) 135 - 145 mmol/L   Potassium 3.4 (L) 3.5 - 5.1 mmol/L   Chloride 104 98 - 111 mmol/L   CO2 23 22 - 32 mmol/L   Glucose, Bld 108 (H) 70 - 99 mg/dL    Comment: Glucose reference range applies only to samples taken after fasting for at least 8 hours.   BUN 13 6 - 20 mg/dL   Creatinine, Ser 7.35 0.44 - 1.00 mg/dL   Calcium 8.6 (L) 8.9 - 10.3 mg/dL   Total Protein 8.0 6.5 - 8.1 g/dL   Albumin 3.5 3.5 - 5.0 g/dL   AST 329 (H) 15 - 41 U/L   ALT 363 (H) 0 - 44 U/L   Alkaline Phosphatase 160 (H) 38 - 126 U/L   Total Bilirubin 1.4 (H) 0.3 - 1.2 mg/dL   GFR, Estimated >92 >42 mL/min    Comment: (NOTE) Calculated using the CKD-EPI Creatinine Equation (2021)    Anion gap 6 5 - 15    Comment: Performed at [facility], [address] [place], [place] [address]  CBC     Status: Abnormal   Collection Time: 11/05/21 10:53 PM  Result Value Ref Range   WBC 7.4 4.0 - 10.5 K/uL   RBC 4.81 3.87 - 5.11 MIL/uL   Hemoglobin 10.1 (L) 12.0 - 15.0 g/dL   HCT 96.2 (L) 22.9 - 79.8 %   MCV 70.1 (L) 80.0 - 100.0 fL   MCH 21.0 (L) 26.0 - 34.0 pg   MCHC 30.0 30.0 - 36.0 g/dL   RDW 92.1 (H) 19.4 - 17.4 %   Platelets 351 150 - 400 K/uL   nRBC 0.0 0.0 - 0.2 %    Comment: Performed at [facility], [address] [place], [place] [address]  Urinalysis, Routine w reflex microscopic     Status: Abnormal   Collection Time: 11/05/21 10:53 PM  Result Value Ref Range   Color, Urine YELLOW YELLOW   APPearance CLEAR CLEAR   Specific Gravity, Urine >1.030 (H) 1.005 - 1.030   pH 5.5 5.0 - 8.0   Glucose, UA NEGATIVE NEGATIVE mg/dL   Hgb urine dipstick NEGATIVE NEGATIVE   Bilirubin Urine MODERATE (A) NEGATIVE   Ketones, ur NEGATIVE NEGATIVE mg/dL   Protein, ur TRACE (A) NEGATIVE mg/dL   Nitrite NEGATIVE NEGATIVE   Leukocytes,Ua NEGATIVE NEGATIVE    Comment: Performed at [facility], [address] [place], [place] [address]  Urinalysis, Microscopic (reflex)     Status: None   Collection Time: 11/05/21 10:53 PM  Result Value Ref Range   RBC / HPF 0-5 0 - 5 RBC/hpf   WBC, UA 0-5 0 - 5 WBC/hpf   Bacteria, UA NONE SEEN NONE SEEN  Squamous Epithelial / LPF 11-20 0 - 5   Mucus PRESENT     Comment: Performed at [facility], [address] [place], [place] [address]  POC urine preg, ED (not at [facility])     Status: None   Collection Time: 11/05/21 10:59 PM  Result Value Ref Range   Preg Test, Ur NEGATIVE NEGATIVE    Comment:        THE SENSITIVITY OF THIS METHODOLOGY IS >24 mIU/mL   Resp Panel by RT-PCR (Flu A&B, Covid) Nasopharyngeal Swab     Status: None   Collection Time: 11/06/21  5:56 AM   Specimen: Nasopharyngeal Swab; Nasopharyngeal(NP) swabs in vial transport medium  Result Value Ref Range   SARS Coronavirus 2 by RT PCR NEGATIVE NEGATIVE    Comment: (NOTE) SARS-CoV-2 target nucleic acids are NOT DETECTED.  The SARS-CoV-2 RNA is generally detectable in upper respiratory specimens during the acute phase of infection. The lowest concentration of SARS-CoV-2 viral copies this assay can detect is 138 copies/mL. A negative result does not preclude SARS-Cov-2 infection and should not be used as the sole basis for treatment or other patient management decisions. A negative  result may occur with  improper specimen collection/handling, submission of specimen other than nasopharyngeal swab, presence of viral mutation(s) within the areas targeted by this assay, and inadequate number of viral copies(<138 copies/mL). A negative result must be combined with clinical observations, patient history, and epidemiological information. The expected result is Negative.  Fact Sheet for Patients:  BloggerCourse.com  Fact Sheet for Healthcare Providers:  SeriousBroker.it  This test is no t yet approved or cleared by the [place] FDA and  has been authorized for detection and/or diagnosis of SARS-CoV-2 by FDA under an Emergency Use Authorization (EUA). This EUA will remain  in effect (meaning this test can be used) for the duration of the COVID-19 declaration under Section 564(b)(1) of the Act, 21 U.S.C.section 360bbb-3(b)(1), unless the authorization is terminated  or revoked sooner.       Influenza A by PCR NEGATIVE NEGATIVE   Influenza B by PCR NEGATIVE NEGATIVE    Comment: (NOTE) The Xpert Xpress SARS-CoV-2/FLU/RSV plus assay is intended as an aid in the diagnosis of influenza from Nasopharyngeal swab specimens and should not be used as a sole basis for treatment. Nasal washings and aspirates are unacceptable for Xpert Xpress SARS-CoV-2/FLU/RSV testing.  Fact Sheet for Patients: BloggerCourse.com  Fact Sheet for Healthcare Providers: SeriousBroker.it  This test is not yet approved or cleared by the [place] FDA and has been authorized for detection and/or diagnosis of SARS-CoV-2 by FDA under an Emergency Use Authorization (EUA). This EUA will remain in effect (meaning this test can be used) for the duration of the COVID-19 declaration under Section 564(b)(1) of the Act, 21 U.S.C. section 360bbb-3(b)(1), unless the authorization is terminated  or revoked.  Performed at [facility], [address] [place], [place] [address]    MR ABDOMEN MRCP W WO CONTAST  Result Date: 11/06/2021 CLINICAL DATA:  26 year old female with history of right upper quadrant pain with cholelithiasis and biliary ductal dilatation noted on recent ultrasound examination. Follow-up study to evaluate for potential choledocholithiasis. EXAM: MRI ABDOMEN WITHOUT AND WITH CONTRAST (INCLUDING MRCP) TECHNIQUE: Multiplanar multisequence MR imaging of the abdomen was performed both before and after the administration of intravenous contrast. Heavily T2-weighted images of the biliary and pancreatic ducts were obtained, and three-dimensional MRCP images were rendered by post processing. CONTRAST:  79mL GADAVIST GADOBUTROL 1  MMOL/ML IV SOLN COMPARISON:  No prior abdominal MRI. Abdominal ultrasound 11/06/2021. CT of the abdomen and pelvis 08/04/2021. FINDINGS: Lower chest: Unremarkable. Hepatobiliary: No suspicious cystic or solid hepatic lesions. Mild intra and extrahepatic biliary ductal dilatation noted on MRCP images. Common bile duct measures up to 8 mm in the porta hepatis. There is a tiny filling defect in the distal common bile duct (axial image 21 of series 4 and MRCP image 61 of series 13), compatible with choledocholithiasis, immediately before the level of the ampulla. Numerous tiny filling defects are also noted within the gallbladder, compatible with gallstones. Gallbladder is only moderately distended. Gallbladder wall thickness is normal. No significant volume of pericholecystic fluid or surrounding inflammatory changes. Pancreas: No pancreatic mass. No pancreatic ductal dilatation. No pancreatic or peripancreatic fluid collections or inflammatory changes. Spleen:  Unremarkable. Adrenals/Urinary Tract: Bilateral kidneys and adrenal glands are normal in appearance. No hydroureteronephrosis in the visualized portions of the abdomen. Bilateral adrenal glands are  normal in appearance. Stomach/Bowel: Unremarkable. Vascular/Lymphatic: No aneurysm identified in the visualized abdominal vasculature. No lymphadenopathy noted in the abdomen. Other: No significant volume of ascites noted in the visualized portions of the peritoneal cavity. Musculoskeletal: No aggressive appearing osseous lesions are noted in the visualized portions of the skeleton. IMPRESSION: 1. Study is positive for cholelithiasis and choledocholithiasis. No evidence of cholecystitis at this time. However, there is very mild intra and extrahepatic biliary ductal dilatation indicating obstruction at the level of the distal common bile duct from choledocholithiasis. Electronically Signed   By: Trudie Reed M.D.   On: 11/06/2021 05:12   US ABDOMEN LIMITED RUQ (LIVER/GB)  Result Date: 11/06/2021 CLINICAL DATA:  Right upper quadrant abdominal pain EXAM: ULTRASOUND ABDOMEN LIMITED RIGHT UPPER QUADRANT COMPARISON:  None. FINDINGS: Gallbladder: Layering gallstones. No gallbladder wall thickening or pericholecystic fluid. Positive sonographic Murphy's sign. Common bile duct: Diameter: 8 mm Liver: No focal lesion identified. Within normal limits in parenchymal echogenicity. Suspected mild intrahepatic ductal dilatation. Portal vein is patent on color Doppler imaging with normal direction of blood flow towards the liver. Other: None. IMPRESSION: Cholelithiasis with positive sonographic Murphy's sign. However, there are no additional grayscale sonographic findings to suggest acute cholecystitis. Dilated common duct, measuring 8 mm. Suspected mild intrahepatic ductal dilatation. This appearance raises the possibility of choledocholithiasis. Correlate with LFTs and consider MRCP or ERCP as clinically warranted. Electronically Signed   By: Charline Bills M.D.   On: 11/06/2021 01:49

## 2021-11-06 NOTE — ED Provider Notes (Signed)
[facility] Provider Note    Event Date/Time   First MD Initiated Contact with Patient 11/06/21 0131     (approximate)   History   Abdominal Pain   HPI  Sandra Wilkerson is a 26 y.o. female who reports no chronic medical conditions and presents for evaluation of severe abdominal pain that is mostly in the upper abdomen and radiates through to her back.  She has nausea and has thrown up once.  She has had a few loose stools.  She said it is similar to multiple episodes that she has had in the past that typically occur at night or after she eats.  However this 1 is much worse and has been going on for hours.  She has had no fever.  She denies chest pain or shortness of breath.  Nothing in particular seems to make it better or worse.     Physical Exam   Triage Vital Signs: ED Triage Vitals  Enc Vitals Group     BP 11/05/21 2252 129/79     Pulse Rate 11/05/21 2252 90     Resp 11/05/21 2252 18     Temp 11/05/21 2252 98 F (36.7 C)     Temp Source 11/05/21 2252 Oral     SpO2 11/05/21 2252 97 %     Weight 11/05/21 2251 77.1 kg (170 lb)     Height 11/05/21 2251 1.499 m (4\' 11" )     Head Circumference --      Peak Flow --      Pain Score 11/05/21 2305 10     Pain Loc --      Pain Edu? --      Excl. in GC? --     Most recent vital signs: Vitals:   11/05/21 2252  BP: 129/79  Pulse: 90  Resp: 18  Temp: 98 F (36.7 C)  SpO2: 97%     General: Awake, appears to be in substantial pain. CV:  Good peripheral perfusion.  No tachycardia. Resp:  Normal effort.  Abd:  Patient has severe tenderness to palpation of the right upper quadrant with positive Murphy sign.  Mild to moderate epigastric tenderness.  No lower abdominal tenderness to palpation although the patient reports some referred pain in her upper abdomen.   ED Results / Procedures / Treatments   Labs (all labs ordered are listed, but only abnormal results are displayed) Labs Reviewed   COMPREHENSIVE METABOLIC PANEL - Abnormal; Notable for the following components:      Result Value   Sodium 133 (*)    Potassium 3.4 (*)    Glucose, Bld 108 (*)    Calcium 8.6 (*)    AST 451 (*)    ALT 363 (*)    Alkaline Phosphatase 160 (*)    Total Bilirubin 1.4 (*)    All other components within normal limits  CBC - Abnormal; Notable for the following components:   Hemoglobin 10.1 (*)    HCT 33.7 (*)    MCV 70.1 (*)    MCH 21.0 (*)    RDW 16.5 (*)    All other components within normal limits  URINALYSIS, ROUTINE W REFLEX MICROSCOPIC - Abnormal; Notable for the following components:   Specific Gravity, Urine >1.030 (*)    Bilirubin Urine MODERATE (*)    Protein, ur TRACE (*)    All other components within normal limits  LIPASE, BLOOD  URINALYSIS, MICROSCOPIC (REFLEX)  POC URINE PREG, ED  POC URINE PREG, ED     EKG  ED ECG REPORT I, Hinda Kehr, the attending physician, personally viewed and interpreted this ECG.  Date: 11/05/2021 EKG Time: 23:16 Rate: 79 Rhythm: normal sinus rhythm with sinus arrhythmia QRS Axis: normal Intervals: normal ST/T Wave abnormalities: No significant ST or T wave changes. Narrative Interpretation: no definitive evidence of acute ischemia; does not meet STEMI criteria.    RADIOLOGY I personally reviewed the ultrasound and I can see gallstones but I do not see any pericholecystic fluid or gallbladder wall thickening.  The radiologist agrees that there is no obvious cholecystitis, but also comments that there is ductal dilatation suggestive of choledocholithiasis.  MRCP reviewed personally by me.  I did not see any obvious or acute abnormalities, but the radiology report did identify choledocholithiasis without evidence of cholecystitis.    PROCEDURES:  Critical Care performed: No  Procedures   MEDICATIONS ORDERED IN ED: Medications  oxyCODONE-acetaminophen (PERCOCET/ROXICET) 5-325 MG per tablet 1 tablet (1 tablet Oral Given  11/05/21 2309)  lactated ringers bolus 1,000 mL (has no administration in time range)  ondansetron (ZOFRAN) injection 4 mg (has no administration in time range)  morphine 4 MG/ML injection 4 mg (has no administration in time range)  ketorolac (TORADOL) 30 MG/ML injection 15 mg (has no administration in time range)  ondansetron (ZOFRAN-ODT) disintegrating tablet 4 mg (4 mg Oral Given 11/05/21 2309)     IMPRESSION / MDM / ASSESSMENT AND PLAN / ED COURSE  I reviewed the triage vital signs and the nursing notes.                              Differential diagnosis includes, but is not limited to, biliary colic, cholecystitis, choledocholithiasis, acid reflux, pancreatitis.  Patient's vital signs are stable and within normal limits.  She is in quite a bit of discomfort and labs were ordered in triage including CBC, CMP, lipase, urinalysis, and urine pregnancy test.  An ultrasound of the right upper quadrant was also ordered.  I reviewed the lab results and they are notable for an essentially normal CBC, a urinalysis notable for bilirubin, otherwise normal with no evidence of infection.  Lipase is within normal limits.  Urine pregnancy test is negative.  However her CMP is notable for transaminitis with an AST of 451 and ALT of 363, as well as an alk phos of 160 and a T bili of 1.4.  These are all suggestive of choledocholithiasis.  I also reviewed the patient's EKG which is unremarkable with no evidence of ischemia.  As documented above, I reviewed the ultrasound and there is no evidence of cholecystitis but suggestive of choledocholithiasis.  I updated the patient for the need for an MRCP.  I ordered LR 1 L bolus, morphine 4 mg IV, Zofran 4 mg IV, and Toradol 15 mg IV.  Patient will remain n.p.o. Disposition pending MRCP results.     Clinical Course as of 11/06/21 0716  Sun Nov 06, 2021  0526 MR ABDOMEN MRCP W WO CONTAST I personally reviewed the MRCP and did not appreciate any obvious  abnormalities.  However the radiologist reviewed it and did identify and diagnosed with choledocholithiasis.  No evidence of cholecystitis.  I called and consulted by phone to Dr. Marius Ditch with gastroenterology.  She advised that it is appropriate to admit the patient to [facility] and she will see the patient later today after being consulted by the hospitalist team.  She confirmed that Dr. Allen Norris will be available on Monday for ERCP if needed. [CF]  0449 I have dated and reassessed the patient.  She is feeling better at this time, but she understands her diagnosis and agrees with the plan for admission.  She is too high risk for sent home given that she is already showing transaminitis with confirmed choledocholithiasis and I believe that she will get significantly worse if she tries to go home. [CF]  0532 Consulted hospitalist for admission. [CF]  0600 I consulted Dr. Sidney Ace with the hospitalist service for admission.  We discussed the case by phone and he will admit the patient.  He will also put in the official consult for Dr. Marius Ditch with gastroenterology. [CF]    Clinical Course User Index [CF] Hinda Kehr, MD     FINAL CLINICAL IMPRESSION(S) / ED DIAGNOSES   Final diagnoses:  Upper abdominal pain  Calculus of bile duct without cholecystitis with obstruction     Rx / DC Orders   ED Discharge Orders     None        Note:  This document was prepared using Dragon voice recognition software and may include unintentional dictation errors.   Hinda Kehr, MD 11/06/21 [phone]

## 2021-11-06 NOTE — ED Notes (Addendum)
Pt resting in room with lights off- this RN offered to come back to give her meds at another time if she would like to continue to rest- pt agreeable to take morning medications later

## 2021-11-06 NOTE — ED Notes (Signed)
Report to caitlin, rn.  °

## 2021-11-06 NOTE — Consult Note (Signed)
[facility] SURGICAL CONSULTATION NOTE (initial) - [id number])   HISTORY OF PRESENT ILLNESS (HPI):  26 y.o. female presented to [facility] ED yesterday for evaluation of epigastric pain. Patient reports epigastric pain which radiates to the back, with associated vomiting.  History of colonic Crohn's disease, on long-term treatment.  History of ilio colectomy with ileorectal anastomosis.  She is noted to have abnormal LFTs in the ER today, with normal lipase.  CBD was noted to be dilated to 8 mm on ultrasound with associated sonographic Murphy sign.  MRCP confirmed choledocholithiasis.  Surgery is consulted by hospitalist physician Dr. Sidney Ace in this context for evaluation and management of choledocholithiasis.  GI has been consulted, and ERCP is planned for tomorrow.   PAST MEDICAL HISTORY (PMH):  Past Medical History:  Diagnosis Date   Crohn disease ([place])    PCOS (polycystic ovarian syndrome)      PAST SURGICAL HISTORY ([place]):  Past Surgical History:  Procedure Laterality Date   APPENDECTOMY     CESAREAN SECTION  07/15/2021   Procedure: CESAREAN SECTION;  Surgeon: Harlin Heys, MD;  Location: [facility] ORS;  Service: Obstetrics;;   COLECTOMY     COLOSTOMY     COLOSTOMY REVERSAL       MEDICATIONS:  Prior to Admission medications   Medication Sig Start Date End Date Taking? Authorizing Provider  norethindrone (MICRONOR) 0.35 MG tablet Take 1 tablet (0.35 mg total) by mouth daily. Patient not taking: Reported on 11/06/2021 08/23/21   Harlin Heys, MD  pantoprazole (PROTONIX) 20 MG tablet Take 1 tablet (20 mg total) by mouth daily. Patient not taking: Reported on 11/06/2021 08/04/21 08/04/22  Nance Pear, MD  Prenat-FeAsp-Meth-FA-DHA w/o A (PRENATE PIXIE) 10-0.6-0.4-200 MG CAPS Take 1 tablet by mouth daily. Patient not taking: Reported on 11/06/2021 12/21/20   Rubie Maid, MD  sucralfate (CARAFATE) 1 g tablet Take 1 tablet (1 g total) by mouth 4 (four) times  daily. Patient not taking: Reported on 11/06/2021 08/04/21   Nance Pear, MD     ALLERGIES:  No Known Allergies   SOCIAL HISTORY:  Social History   Socioeconomic History   Marital status: Single    Spouse name: Not on file   Number of children: Not on file   Years of education: Not on file   Highest education level: Not on file  Occupational History   Not on file  Tobacco Use   Smoking status: Never   Smokeless tobacco: Never  Vaping Use   Vaping Use: Never used  Substance and Sexual Activity   Alcohol use: No   Drug use: No   Sexual activity: Yes    Birth control/protection: None  Other Topics Concern   Not on file  Social History Narrative   Not on file   Social Determinants of Health   Financial Resource Strain: Not on file  Food Insecurity: Not on file  Transportation Needs: Not on file  Physical Activity: Not on file  Stress: Not on file  Social Connections: Not on file  Intimate Partner Violence: Not on file     FAMILY HISTORY:  Family History  Problem Relation Age of Onset   Healthy Mother    Asthma Father    Diabetes Maternal Grandmother       REVIEW OF SYSTEMS:  Review of Systems  All other systems reviewed and are negative.  VITAL SIGNS:  Temp:  [98 F (36.7 C)] 98 F (36.7 C) (01/28 2252) Pulse Rate:  [63-100] 77 (01/29  1200) Resp:  [16-18] 18 (01/29 1200) BP: (100-129)/(59-88) 127/81 (01/29 1200) SpO2:  [97 %-100 %] 100 % (01/29 1200) Weight:  [77.1 kg] 77.1 kg (01/28 2251)     Height: 4\' 11"  (149.9 cm) Weight: 77.1 kg BMI (Calculated): 34.32   INTAKE/OUTPUT:  01/28 0701 - 01/29 0700 In: 1000 [IV Piggyback:1000] Out: -   PHYSICAL EXAM:  Physical Exam Blood pressure 127/81, pulse 77, temperature 98 F (36.7 C), temperature source Oral, resp. rate 18, height 4\' 11"  (1.499 m), weight 77.1 kg, last menstrual period 10/08/2021, SpO2 100 %, not currently breastfeeding. Last Weight  Most recent update: 11/05/2021 10:51 PM    Weight   77.1 kg (170 lb)             CONSTITUTIONAL: Well developed, and nourished, appropriately responsive and aware without distress.   EYES: Sclera non-icteric.   EARS, NOSE, MOUTH AND THROAT: Mask worn.   The oropharynx is clear. Oral mucosa is pink and moist.    Hearing is intact to voice.  NECK: Trachea is midline, and there is no jugular venous distension.  LYMPH NODES:  Lymph nodes in the neck are not enlarged. RESPIRATORY:  Lungs are clear, and breath sounds are equal bilaterally. Normal respiratory effort without pathologic use of accessory muscles. CARDIOVASCULAR: Heart is regular in rate and rhythm. GI: The abdomen is has a large midline and right lower quadrant scar, tender in the right upper quadrant/epigastrium, otherwise soft, nontender, and nondistended. There were no palpable masses. I did not appreciate hepatosplenomegaly. There were normal bowel sounds. MUSCULOSKELETAL:  Symmetrical muscle tone appreciated in all four extremities.    SKIN: Skin turgor is normal. No pathologic skin lesions appreciated.  NEUROLOGIC:  Motor and sensation appear grossly normal.  Cranial nerves are grossly without defect. PSYCH:  Alert and oriented to person, place and time. Affect is appropriate for situation.  Data Reviewed I have personally reviewed what is currently available of the patient's imaging, recent labs and medical records.    Labs:  CBC Latest Ref Rng & Units 11/05/2021 08/04/2021 07/17/2021  WBC 4.0 - 10.5 K/uL 7.4 7.5 -  Hemoglobin 12.0 - 15.0 g/dL 10.1(L) 11.2(L) 8.4(L)  Hematocrit 36.0 - 46.0 % 33.7(L) 34.7(L) 25.1(L)  Platelets 150 - 400 K/uL 351 483(H) -   CMP Latest Ref Rng & Units 11/05/2021 08/04/2021 03/04/2018  Glucose 70 - 99 mg/dL 108(H) 93 102(H)  BUN 6 - 20 mg/dL 13 16 13   Creatinine 0.44 - 1.00 mg/dL 0.68 0.67 0.71  Sodium 135 - 145 mmol/L 133(L) 137 136  Potassium 3.5 - 5.1 mmol/L 3.4(L) 3.6 3.8  Chloride 98 - 111 mmol/L 104 107 108  CO2 22 - 32 mmol/L 23 23  23   Calcium 8.9 - 10.3 mg/dL 8.6(L) 8.8(L) 8.6(L)  Total Protein 6.5 - 8.1 g/dL 8.0 7.6 -  Total Bilirubin 0.3 - 1.2 mg/dL 1.4(H) 0.6 -  Alkaline Phos 38 - 126 U/L 160(H) 95 -  AST 15 - 41 U/L 451(H) 19 -  ALT 0 - 44 U/L 363(H) 15 -     Imaging studies:   Last 24 hrs: MR ABDOMEN MRCP W WO CONTAST  Result Date: 11/06/2021 CLINICAL DATA:  26 year old female with history of right upper quadrant pain with cholelithiasis and biliary ductal dilatation noted on recent ultrasound examination. Follow-up study to evaluate for potential choledocholithiasis. EXAM: MRI ABDOMEN WITHOUT AND WITH CONTRAST (INCLUDING MRCP) TECHNIQUE: Multiplanar multisequence MR imaging of the abdomen was performed both before and after the administration of  intravenous contrast. Heavily T2-weighted images of the biliary and pancreatic ducts were obtained, and three-dimensional MRCP images were rendered by post processing. CONTRAST:  46mL GADAVIST GADOBUTROL 1 MMOL/ML IV SOLN COMPARISON:  No prior abdominal MRI. Abdominal ultrasound 11/06/2021. CT of the abdomen and pelvis 08/04/2021. FINDINGS: Lower chest: Unremarkable. Hepatobiliary: No suspicious cystic or solid hepatic lesions. Mild intra and extrahepatic biliary ductal dilatation noted on MRCP images. Common bile duct measures up to 8 mm in the porta hepatis. There is a tiny filling defect in the distal common bile duct (axial image 21 of series 4 and MRCP image 61 of series 13), compatible with choledocholithiasis, immediately before the level of the ampulla. Numerous tiny filling defects are also noted within the gallbladder, compatible with gallstones. Gallbladder is only moderately distended. Gallbladder wall thickness is normal. No significant volume of pericholecystic fluid or surrounding inflammatory changes. Pancreas: No pancreatic mass. No pancreatic ductal dilatation. No pancreatic or peripancreatic fluid collections or inflammatory changes. Spleen:  Unremarkable.  Adrenals/Urinary Tract: Bilateral kidneys and adrenal glands are normal in appearance. No hydroureteronephrosis in the visualized portions of the abdomen. Bilateral adrenal glands are normal in appearance. Stomach/Bowel: Unremarkable. Vascular/Lymphatic: No aneurysm identified in the visualized abdominal vasculature. No lymphadenopathy noted in the abdomen. Other: No significant volume of ascites noted in the visualized portions of the peritoneal cavity. Musculoskeletal: No aggressive appearing osseous lesions are noted in the visualized portions of the skeleton. IMPRESSION: 1. Study is positive for cholelithiasis and choledocholithiasis. No evidence of cholecystitis at this time. However, there is very mild intra and extrahepatic biliary ductal dilatation indicating obstruction at the level of the distal common bile duct from choledocholithiasis. Electronically Signed   By: Vinnie Langton M.D.   On: 11/06/2021 05:12   US ABDOMEN LIMITED RUQ (LIVER/GB)  Result Date: 11/06/2021 CLINICAL DATA:  Right upper quadrant abdominal pain EXAM: ULTRASOUND ABDOMEN LIMITED RIGHT UPPER QUADRANT COMPARISON:  None. FINDINGS: Gallbladder: Layering gallstones. No gallbladder wall thickening or pericholecystic fluid. Positive sonographic Murphy's sign. Common bile duct: Diameter: 8 mm Liver: No focal lesion identified. Within normal limits in parenchymal echogenicity. Suspected mild intrahepatic ductal dilatation. Portal vein is patent on color Doppler imaging with normal direction of blood flow towards the liver. Other: None. IMPRESSION: Cholelithiasis with positive sonographic Murphy's sign. However, there are no additional grayscale sonographic findings to suggest acute cholecystitis. Dilated common duct, measuring 8 mm. Suspected mild intrahepatic ductal dilatation. This appearance raises the possibility of choledocholithiasis. Correlate with LFTs and consider MRCP or ERCP as clinically warranted. Electronically Signed   By:  Julian Hy M.D.   On: 11/06/2021 01:49     Assessment/Plan:  26 y.o. female with choledocholithiasis, complicated by pertinent comorbidities including :  Patient Active Problem List   Diagnosis Date Noted   Choledocholithiasis 11/06/2021   Post-dates pregnancy 07/15/2021   Uterine contractions during pregnancy    Pregnancy 07/12/2021   Group B streptococcal carriage complicating pregnancy 0000000   Indication for care in labor or delivery 05/16/2021   Abdominal pregnancy with intrauterine pregnancy 01/06/2021   Subchorionic hematoma in first trimester 12/29/2020   History of infertility 12/29/2020   Obesity (BMI 30-39.9) 12/29/2020   PCO (polycystic ovaries) 12/29/2020   Iron deficiency 03/13/2018   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     All of the above findings and recommendations were discussed with the patient and family(if present), and all of patient's questions were answered to their expressed satisfaction.  Thank you for the opportunity to participate in this patient's care.   -- Ronny Bacon, M.D., FACS 11/06/2021, 12:49 PM

## 2021-11-07 ENCOUNTER — Encounter
Admission: EM | Disposition: A | Discharge status: Home / Self Care | Payer: Self-pay | Source: Home / Self Care | Attending: Osteopathic Medicine

## 2021-11-07 ENCOUNTER — Inpatient Hospital Stay: Payer: Medicaid Other | Admitting: Anesthesiology

## 2021-11-07 ENCOUNTER — Encounter: Payer: Self-pay | Admitting: Gastroenterology

## 2021-11-07 ENCOUNTER — Inpatient Hospital Stay: Payer: Medicaid Other

## 2021-11-07 DIAGNOSIS — K8050 Calculus of bile duct without cholangitis or cholecystitis without obstruction: Secondary | ICD-10-CM | POA: Diagnosis not present

## 2021-11-07 HISTORY — PX: ERCP: SHX5425

## 2021-11-07 LAB — CBC
HCT: 30.3 % — ABNORMAL LOW (ref 36.0–46.0)
Hemoglobin: 8.9 g/dL — ABNORMAL LOW (ref 12.0–15.0)
MCH: 20.7 pg — ABNORMAL LOW (ref 26.0–34.0)
MCHC: 29.4 g/dL — ABNORMAL LOW (ref 30.0–36.0)
MCV: 70.6 fL — ABNORMAL LOW (ref 80.0–100.0)
Platelets: 295 10*3/uL (ref 150–400)
RBC: 4.29 MIL/uL (ref 3.87–5.11)
RDW: 16.6 % — ABNORMAL HIGH (ref 11.5–15.5)
WBC: 6.1 10*3/uL (ref 4.0–10.5)
nRBC: 0 % (ref 0.0–0.2)

## 2021-11-07 SURGERY — ERCP, WITH INTERVENTION IF INDICATED
Anesthesia: General

## 2021-11-07 MED ORDER — EPHEDRINE SULFATE (PRESSORS) 50 MG/ML IJ SOLN
INTRAMUSCULAR | Status: DC | PRN
  Administered 2021-11-07: 10 mg via INTRAVENOUS

## 2021-11-07 MED ORDER — ONDANSETRON HCL 4 MG/2ML IJ SOLN
INTRAMUSCULAR | Status: DC | PRN
  Administered 2021-11-07: 4 mg via INTRAVENOUS

## 2021-11-07 MED ORDER — ROCURONIUM BROMIDE 100 MG/10ML IV SOLN
INTRAVENOUS | Status: DC | PRN
  Administered 2021-11-07: 5 mg via INTRAVENOUS

## 2021-11-07 MED ORDER — SODIUM CHLORIDE 0.9 % IV SOLN
1.5000 g | Freq: Once | INTRAVENOUS | Status: DC
  Filled 2021-11-07: qty 4

## 2021-11-07 MED ORDER — FENTANYL CITRATE (PF) 100 MCG/2ML IJ SOLN
INTRAMUSCULAR | Status: DC | PRN
  Administered 2021-11-07: 25 ug via INTRAVENOUS
  Administered 2021-11-07: 50 ug via INTRAVENOUS

## 2021-11-07 MED ORDER — LACTATED RINGERS IV SOLN
INTRAVENOUS | Status: DC
  Administered 2021-11-07: 1000 mL via INTRAVENOUS

## 2021-11-07 MED ORDER — SODIUM CHLORIDE 0.9 % IV SOLN: INTRAVENOUS | Status: DC

## 2021-11-07 MED ORDER — FENTANYL CITRATE (PF) 100 MCG/2ML IJ SOLN
INTRAMUSCULAR | Status: AC
  Filled 2021-11-07: qty 2

## 2021-11-07 MED ORDER — DEXAMETHASONE SODIUM PHOSPHATE 10 MG/ML IJ SOLN
INTRAMUSCULAR | Status: AC
  Filled 2021-11-07: qty 1

## 2021-11-07 MED ORDER — EPHEDRINE 5 MG/ML INJ
INTRAVENOUS | Status: AC
  Filled 2021-11-07: qty 5

## 2021-11-07 MED ORDER — MIDAZOLAM HCL 2 MG/2ML IJ SOLN
INTRAMUSCULAR | Status: DC | PRN
  Administered 2021-11-07: 2 mg via INTRAVENOUS

## 2021-11-07 MED ORDER — SUCCINYLCHOLINE CHLORIDE 200 MG/10ML IV SOSY
PREFILLED_SYRINGE | INTRAVENOUS | Status: DC | PRN
  Administered 2021-11-07: 100 mg via INTRAVENOUS

## 2021-11-07 MED ORDER — SUCCINYLCHOLINE CHLORIDE 200 MG/10ML IV SOSY
PREFILLED_SYRINGE | INTRAVENOUS | Status: AC
  Filled 2021-11-07: qty 10

## 2021-11-07 MED ORDER — INDOMETHACIN 50 MG RE SUPP
RECTAL | Status: AC
  Filled 2021-11-07: qty 2

## 2021-11-07 MED ORDER — LIDOCAINE HCL (CARDIAC) PF 100 MG/5ML IV SOSY
PREFILLED_SYRINGE | INTRAVENOUS | Status: DC | PRN
  Administered 2021-11-07: 100 mg via INTRAVENOUS

## 2021-11-07 MED ORDER — ONDANSETRON HCL 4 MG/2ML IJ SOLN
INTRAMUSCULAR | Status: AC
  Filled 2021-11-07: qty 2

## 2021-11-07 MED ORDER — DEXAMETHASONE SODIUM PHOSPHATE 10 MG/ML IJ SOLN
INTRAMUSCULAR | Status: DC | PRN
Start: 2021-11-07 — End: 2021-11-07
  Administered 2021-11-07: 5 mg via INTRAVENOUS

## 2021-11-07 MED ORDER — MIDAZOLAM HCL 2 MG/2ML IJ SOLN
INTRAMUSCULAR | Status: AC
  Filled 2021-11-07: qty 2

## 2021-11-07 MED ORDER — MORPHINE SULFATE (PF) 2 MG/ML IV SOLN
1.0000 mg | INTRAVENOUS | Status: DC | PRN
  Administered 2021-11-07 – 2021-11-08 (×5): 2 mg via INTRAVENOUS
  Filled 2021-11-07 (×6): qty 1

## 2021-11-07 MED ORDER — PHENYLEPHRINE HCL (PRESSORS) 10 MG/ML IV SOLN
INTRAVENOUS | Status: DC | PRN
  Administered 2021-11-07: 200 ug via INTRAVENOUS

## 2021-11-07 MED ORDER — INDOMETHACIN 50 MG RE SUPP
100.0000 mg | Freq: Once | RECTAL | Status: AC
  Administered 2021-11-07: 100 mg via RECTAL

## 2021-11-07 MED ORDER — ROCURONIUM BROMIDE 10 MG/ML (PF) SYRINGE
PREFILLED_SYRINGE | INTRAVENOUS | Status: AC
  Filled 2021-11-07: qty 10

## 2021-11-07 MED ORDER — PROPOFOL 10 MG/ML IV BOLUS
INTRAVENOUS | Status: DC | PRN
Start: 2021-11-07 — End: 2021-11-07
  Administered 2021-11-07: 50 mg via INTRAVENOUS
  Administered 2021-11-07: 150 mg via INTRAVENOUS

## 2021-11-07 NOTE — Final Progress Note (Signed)
Patient awake/alert x4. Tolerated sips of water without event, diet clear liquids for 24hrs. Afebrile, vitals stable/baseline. Used bedpan for urine and small amt loose stool.

## 2021-11-07 NOTE — Anesthesia Preprocedure Evaluation (Signed)
Anesthesia Evaluation  Patient identified by MRN, date of birth, ID band Patient awake  General Assessment Comment:  Vomiting last night, none today. Does have RUQ pain today  Reviewed: Allergy & Precautions, NPO status , Patient's Chart, lab work & pertinent test results  History of Anesthesia Complications Negative for: history of anesthetic complications  Airway Mallampati: III  TM Distance: >3 FB Neck ROM: Full    Dental no notable dental hx. (+) Teeth Intact   Pulmonary asthma , neg sleep apnea, neg COPD, Patient abstained from smoking.Not current smoker,    Symptoms consistent with OSA   Pulmonary exam normal breath sounds clear to auscultation       Cardiovascular Exercise Tolerance: Good METS(-) hypertension(-) CAD and (-) Past MI negative cardio ROS  (-) dysrhythmias  Rhythm:Regular Rate:Normal - Systolic murmurs    Neuro/Psych  Headaches, negative psych ROS   GI/Hepatic neg GERD  ,(+)     (-) substance abuse  ,   Endo/Other  neg diabetes  Renal/GU negative Renal ROS     Musculoskeletal   Abdominal (+)  Abdomen: tender.    Peds  Hematology   Anesthesia Other Findings Past Medical History: No date: Crohn disease ([place]) No date: PCOS (polycystic ovarian syndrome)  Reproductive/Obstetrics                             Anesthesia Physical Anesthesia Plan  ASA: 2  Anesthesia Plan: General   Post-op Pain Management: Toradol IV (intra-op)   Induction: Intravenous  PONV Risk Score and Plan: 3 and Ondansetron, Dexamethasone and Midazolam  Airway Management Planned: Oral ETT  Additional Equipment: None  Intra-op Plan:   Post-operative Plan: Extubation in OR  Informed Consent: I have reviewed the patients History and Physical, chart, labs and discussed the procedure including the risks, benefits and alternatives for the proposed anesthesia with the patient or  authorized representative who has indicated his/her understanding and acceptance.     Dental advisory given  Plan Discussed with: CRNA and Surgeon  Anesthesia Plan Comments: (Discussed risks of anesthesia with patient, including PONV, sore throat, lip/dental/eye damage. Rare risks discussed as well, such as cardiorespiratory and neurological sequelae, and allergic reactions. Discussed the role of CRNA in patient's perioperative care. Patient understands.)        Anesthesia Quick Evaluation

## 2021-11-07 NOTE — Transfer of Care (Signed)
Immediate Anesthesia Transfer of Care Note  Patient: Sandra Wilkerson  Procedure(s) Performed: ENDOSCOPIC RETROGRADE CHOLANGIOPANCREATOGRAPHY (ERCP)  Patient Location: PACU  Anesthesia Type:General  Level of Consciousness: drowsy  Airway & Oxygen Therapy: Patient Spontanous Breathing  Post-op Assessment: Report given to RN and Post -op Vital signs reviewed and stable  Post vital signs: Reviewed and stable  Last Vitals:  Vitals Value Taken Time  BP 124/86 11/07/21 1243  Temp    Pulse 76 11/07/21 1245  Resp 23 11/07/21 1245  SpO2 100 % 11/07/21 1245  Vitals shown include unvalidated device data.  Last Pain:  Vitals:   11/07/21 1000  TempSrc:   PainSc: 8       Patients Stated Pain Goal: 0 ([id number])  Complications: No notable events documented.

## 2021-11-07 NOTE — Anesthesia Postprocedure Evaluation (Signed)
Anesthesia Post Note  Patient: Sandra Wilkerson  Procedure(s) Performed: ENDOSCOPIC RETROGRADE CHOLANGIOPANCREATOGRAPHY (ERCP)  Patient location during evaluation: PACU Anesthesia Type: General Level of consciousness: awake and alert Pain management: pain level controlled Vital Signs Assessment: post-procedure vital signs reviewed and stable Respiratory status: spontaneous breathing, nonlabored ventilation, respiratory function stable and patient connected to nasal cannula oxygen Cardiovascular status: blood pressure returned to baseline and stable Postop Assessment: no apparent nausea or vomiting Anesthetic complications: no   No notable events documented.   Last Vitals:  Vitals:   11/07/21 1245 11/07/21 1259  BP: 115/82 113/73  Pulse: 79 87  Resp: 17 16  Temp: (!) 36.4 C (!) 36.3 C  SpO2: 99% 100%    Last Pain:  Vitals:   11/07/21 1259  TempSrc:   PainSc: 0-No pain                 Corinda Gubler

## 2021-11-07 NOTE — Progress Notes (Signed)
PROGRESS NOTE    Sandra Wilkerson  [id number] DOB: 1996-05-24 DOA: 11/06/2021 PCP: Danelle Berry, NP     Brief Narrative:   Sandra Wilkerson is a 26 year old female with medical history significant for Crohn's, GERD, asthma, PCOS.  She presented to the ED yesterday, 11/06/2021, with acute onset severe upper abdominal pain and radiation to the back.  Nausea/vomiting/.  No bilious vomitus or hematemesis, no melena or BRBPR.  Normal vital signs in ED, hyponatremia 133, hypokalemia 3.4, elevated AST 451 and ALT 363 anemia with hemoglobin 10.9 slightly lower than previous levels, no concerns on EKG, right upper quadrant ultrasound demonstrated cholelithiasis, common bile duct was dilated to 8 mm with suspected mild intrahepatic ductal dilatation, MRCP revealed cholelithiasis and choledocholithiasis.  Patient was treated with Zofran, morphine, IV fluids.  Seen by gastroenterology and surgery.  Gastroenterology planning for ERCP today, 11/07/2021, surgery planning for cholecystectomy day after that, day 11/08/2021  Today 11/07/21: Complaints this morning of some worsening abdominal pain, no pain meds this was corrected.  Nausea medications are on board.  Patient taken for ERCP,  removal of calculi/debris from biliary/pancreatic ducts, see gastroenterology note for full details.   Assessment & Plan:   Principal Problem:   Choledocholithiasis Active Problems:   Crohn's colitis ([place])   Iron deficiency  Choledocholithiasis 11/07/2021: ERCP with removal of calculi/debris from biliary/pancreatic ducts Planning for cholecystectomy tomorrow Pain controlled  Crohn's colitis Stable, GI following  Iron deficiency Stable, following serial CBC    DVT prophylaxis: Lovenox Code Status: FULL Family Communication: partner at bedside this AM Disposition Plan: home pending surgery tomorrow    Consultants:  Gastroenterology General surgery  Procedures: 11/07/2021: ERCP with removal of  calculi/debris from biliary/pancreatic ducts   Antimicrobials:  Anti-infectives (From admission, onward)    Start     Dose/Rate Route Frequency Ordered Stop   11/08/21 1000  ceFAZolin (ANCEF) IVPB 2g/100 mL premix  Status:  Discontinued        2 g 200 mL/hr over 30 Minutes Intravenous On call to O.R. 11/06/21 1427 11/06/21 1431   11/08/21 0000  ceFAZolin (ANCEF) powder 2 g  Status:  Discontinued        2 g Other To Surgery 11/06/21 1419 11/06/21 1424   11/07/21 1145  [MAR Hold]  ampicillin-sulbactam (UNASYN) 1.5 g in sodium chloride 0.9 % 100 mL IVPB        (MAR Hold since Mon 11/07/2021 at 1110.Hold Reason: Transfer to a Procedural area)   1.5 g 200 mL/hr over 30 Minutes Intravenous  Once 11/07/21 1045     11/07/21 0600  [MAR Hold]  ceFAZolin (ANCEF) IVPB 2g/100 mL premix        (MAR Hold since Mon 11/07/2021 at 1110.Hold Reason: Transfer to a Procedural area)   2 g 200 mL/hr over 30 Minutes Intravenous On call to O.R. 11/06/21 1431 11/08/21 0559        Subjective: Patient reports doing well this AM compared to yesterday but some abdominal pain.  No fever, chills, nausea, vomiting, diarrhea or other concerns.  Objective: Vitals:   11/07/21 0521 11/07/21 0851 11/07/21 1245 11/07/21 1259  BP: 105/68 116/90 115/82 113/73  Pulse: 77 74 79 87  Resp: 18 18 17 16   Temp: 98.3 F (36.8 C) 98.3 F (36.8 C) (!) 97.5 F (36.4 C) (!) 97.3 F (36.3 C)  TempSrc: Oral     SpO2: 100% 100% 99% 100%  Weight:      Height:  Intake/Output Summary (Last 24 hours) at 11/07/2021 1337 Last data filed at 11/07/2021 1300 Gross per 24 hour  Intake 3145.55 ml  Output --  Net 3145.55 ml   Filed Weights   11/05/21 2251 11/06/21 1716  Weight: 77.1 kg 75.8 kg    Examination:  General exam: Appears calm and comfortable  Respiratory system: Clear to auscultation. Respiratory effort normal. Cardiovascular system: S1 & S2 heard, RRR. No JVD, murmurs, rubs, gallops or clicks. No pedal  edema. Gastrointestinal system: Abdomen is nondistended, mild epigastric tenderness Central nervous system: Alert and oriented. No focal neurological deficits Skin: No rashes, lesions or ulcers Psychiatry: Judgement and insight appear normal. Mood & affect appropriate.     Data Reviewed: I have personally reviewed following labs and imaging studies  CBC: Recent Labs  Lab 11/05/21 2253 11/07/21 0507  WBC 7.4 6.1  HGB 10.1* 8.9*  HCT 33.7* 30.3*  MCV 70.1* 70.6*  PLT 351 [id number]   Basic Metabolic Panel: Recent Labs  Lab 11/05/21 2253  NA 133*  K 3.4*  CL 104  CO2 23  GLUCOSE 108*  BUN 13  CREATININE 0.68  CALCIUM 8.6*   GFR: Estimated Creatinine Clearance: 92.9 mL/min (by C-G formula based on SCr of 0.68 mg/dL). Liver Function Tests: Recent Labs  Lab 11/05/21 2253  AST 451*  ALT 363*  ALKPHOS 160*  BILITOT 1.4*  PROT 8.0  ALBUMIN 3.5   Recent Labs  Lab 11/05/21 2253  LIPASE 49   No results for input(s): AMMONIA in the last 168 hours. Coagulation Profile: No results for input(s): INR, PROTIME in the last 168 hours. Cardiac Enzymes: No results for input(s): CKTOTAL, CKMB, CKMBINDEX, TROPONINI in the last 168 hours. BNP (last 3 results) No results for input(s): PROBNP in the last 8760 hours. HbA1C: No results for input(s): HGBA1C in the last 72 hours. CBG: No results for input(s): GLUCAP in the last 168 hours. Lipid Profile: No results for input(s): CHOL, HDL, LDLCALC, TRIG, CHOLHDL, LDLDIRECT in the last 72 hours. Thyroid Function Tests: No results for input(s): TSH, T4TOTAL, FREET4, T3FREE, THYROIDAB in the last 72 hours. Anemia Panel: No results for input(s): VITAMINB12, FOLATE, FERRITIN, TIBC, IRON, RETICCTPCT in the last 72 hours. Urine analysis:    Component Value Date/Time   COLORURINE YELLOW 11/05/2021 2253   APPEARANCEUR CLEAR 11/05/2021 2253   APPEARANCEUR Cloudy (A) 12/21/2020 1440   LABSPEC >1.030 (H) 11/05/2021 2253   PHURINE 5.5  11/05/2021 2253   GLUCOSEU NEGATIVE 11/05/2021 2253   HGBUR NEGATIVE 11/05/2021 2253   BILIRUBINUR MODERATE (A) 11/05/2021 2253   BILIRUBINUR neg 07/13/2021 1330   BILIRUBINUR Negative 12/21/2020 1440   KETONESUR NEGATIVE 11/05/2021 2253   PROTEINUR TRACE (A) 11/05/2021 2253   UROBILINOGEN 0.2 07/13/2021 1330   NITRITE NEGATIVE 11/05/2021 2253   LEUKOCYTESUR NEGATIVE 11/05/2021 2253   Sepsis Labs: @LABRCNTIP (procalcitonin:4,lacticidven:4)  Recent Results (from the past 240 hour(s))  Resp Panel by RT-PCR (Flu A&B, Covid) Nasopharyngeal Swab     Status: None   Collection Time: 11/06/21  5:56 AM   Specimen: Nasopharyngeal Swab; Nasopharyngeal(NP) swabs in vial transport medium  Result Value Ref Range Status   SARS Coronavirus 2 by RT PCR NEGATIVE NEGATIVE Final    Comment: (NOTE) SARS-CoV-2 target nucleic acids are NOT DETECTED.  The SARS-CoV-2 RNA is generally detectable in upper respiratory specimens during the acute phase of infection. The lowest concentration of SARS-CoV-2 viral copies this assay can detect is 138 copies/mL. A negative result does not preclude SARS-Cov-2 infection and should  not be used as the sole basis for treatment or other patient management decisions. A negative result may occur with  improper specimen collection/handling, submission of specimen other than nasopharyngeal swab, presence of viral mutation(s) within the areas targeted by this assay, and inadequate number of viral copies(<138 copies/mL). A negative result must be combined with clinical observations, patient history, and epidemiological information. The expected result is Negative.  Fact Sheet for Patients:  EntrepreneurPulse.com.au  Fact Sheet for Healthcare Providers:  IncredibleEmployment.be  This test is no t yet approved or cleared by the [place] FDA and  has been authorized for detection and/or diagnosis of SARS-CoV-2 by FDA under an  Emergency Use Authorization (EUA). This EUA will remain  in effect (meaning this test can be used) for the duration of the COVID-19 declaration under Section 564(b)(1) of the Act, 21 U.S.C.section 360bbb-3(b)(1), unless the authorization is terminated  or revoked sooner.       Influenza A by PCR NEGATIVE NEGATIVE Final   Influenza B by PCR NEGATIVE NEGATIVE Final    Comment: (NOTE) The Xpert Xpress SARS-CoV-2/FLU/RSV plus assay is intended as an aid in the diagnosis of influenza from Nasopharyngeal swab specimens and should not be used as a sole basis for treatment. Nasal washings and aspirates are unacceptable for Xpert Xpress SARS-CoV-2/FLU/RSV testing.  Fact Sheet for Patients: EntrepreneurPulse.com.au  Fact Sheet for Healthcare Providers: IncredibleEmployment.be  This test is not yet approved or cleared by the [place] FDA and has been authorized for detection and/or diagnosis of SARS-CoV-2 by FDA under an Emergency Use Authorization (EUA). This EUA will remain in effect (meaning this test can be used) for the duration of the COVID-19 declaration under Section 564(b)(1) of the Act, 21 U.S.C. section 360bbb-3(b)(1), unless the authorization is terminated or revoked.  Performed at [facility], [address] [place], [place] [address]          Radiology Studies last 96 hours: DG C-Arm 1-60 Min-No Report  Result Date: 11/07/2021 Fluoroscopy was utilized by the requesting physician.  No radiographic interpretation.   MR ABDOMEN MRCP W WO CONTAST  Result Date: 11/06/2021 CLINICAL DATA:  26 year old female with history of right upper quadrant pain with cholelithiasis and biliary ductal dilatation noted on recent ultrasound examination. Follow-up study to evaluate for potential choledocholithiasis. EXAM: MRI ABDOMEN WITHOUT AND WITH CONTRAST (INCLUDING MRCP) TECHNIQUE: Multiplanar multisequence MR imaging of the abdomen  was performed both before and after the administration of intravenous contrast. Heavily T2-weighted images of the biliary and pancreatic ducts were obtained, and three-dimensional MRCP images were rendered by post processing. CONTRAST:  52mL GADAVIST GADOBUTROL 1 MMOL/ML IV SOLN COMPARISON:  No prior abdominal MRI. Abdominal ultrasound 11/06/2021. CT of the abdomen and pelvis 08/04/2021. FINDINGS: Lower chest: Unremarkable. Hepatobiliary: No suspicious cystic or solid hepatic lesions. Mild intra and extrahepatic biliary ductal dilatation noted on MRCP images. Common bile duct measures up to 8 mm in the porta hepatis. There is a tiny filling defect in the distal common bile duct (axial image 21 of series 4 and MRCP image 61 of series 13), compatible with choledocholithiasis, immediately before the level of the ampulla. Numerous tiny filling defects are also noted within the gallbladder, compatible with gallstones. Gallbladder is only moderately distended. Gallbladder wall thickness is normal. No significant volume of pericholecystic fluid or surrounding inflammatory changes. Pancreas: No pancreatic mass. No pancreatic ductal dilatation. No pancreatic or peripancreatic fluid collections or inflammatory changes. Spleen:  Unremarkable. Adrenals/Urinary Tract: Bilateral kidneys and adrenal glands are  normal in appearance. No hydroureteronephrosis in the visualized portions of the abdomen. Bilateral adrenal glands are normal in appearance. Stomach/Bowel: Unremarkable. Vascular/Lymphatic: No aneurysm identified in the visualized abdominal vasculature. No lymphadenopathy noted in the abdomen. Other: No significant volume of ascites noted in the visualized portions of the peritoneal cavity. Musculoskeletal: No aggressive appearing osseous lesions are noted in the visualized portions of the skeleton. IMPRESSION: 1. Study is positive for cholelithiasis and choledocholithiasis. No evidence of cholecystitis at this time. However,  there is very mild intra and extrahepatic biliary ductal dilatation indicating obstruction at the level of the distal common bile duct from choledocholithiasis. Electronically Signed   By: Vinnie Langton M.D.   On: 11/06/2021 05:12   US ABDOMEN LIMITED RUQ (LIVER/GB)  Result Date: 11/06/2021 CLINICAL DATA:  Right upper quadrant abdominal pain EXAM: ULTRASOUND ABDOMEN LIMITED RIGHT UPPER QUADRANT COMPARISON:  None. FINDINGS: Gallbladder: Layering gallstones. No gallbladder wall thickening or pericholecystic fluid. Positive sonographic Murphy's sign. Common bile duct: Diameter: 8 mm Liver: No focal lesion identified. Within normal limits in parenchymal echogenicity. Suspected mild intrahepatic ductal dilatation. Portal vein is patent on color Doppler imaging with normal direction of blood flow towards the liver. Other: None. IMPRESSION: Cholelithiasis with positive sonographic Murphy's sign. However, there are no additional grayscale sonographic findings to suggest acute cholecystitis. Dilated common duct, measuring 8 mm. Suspected mild intrahepatic ductal dilatation. This appearance raises the possibility of choledocholithiasis. Correlate with LFTs and consider MRCP or ERCP as clinically warranted. Electronically Signed   By: Julian Hy M.D.   On: 11/06/2021 01:49         Scheduled Meds:  [MAR Hold] enoxaparin (LOVENOX) injection  40 mg Subcutaneous Q24H   [START ON 11/08/2021] indocyanine green  2.5 mg Intravenous Once   indomethacin       [MAR Hold] pantoprazole  20 mg Oral Daily   [MAR Hold] prenatal multivitamin  1 tablet Oral Daily   [MAR Hold] sucralfate  1 g Oral QID   Continuous Infusions:  sodium chloride     0.9 % NaCl with KCl 20 mEq / L 100 mL/hr at 11/07/21 0526   [MAR Hold] ampicillin-sulbactam (UNASYN) 1.5 g IVPB (Mini-Bag Plus)     [MAR Hold]  ceFAZolin (ANCEF) IV     lactated ringers 125 mL/hr at 11/07/21 1140     LOS: 1 day    Time spent: 30 min    Emeterio Reeve, DO [facility]  If 7PM-7AM, please contact night-coverage [email or web address] 11/07/2021, 1:37 PM

## 2021-11-07 NOTE — Op Note (Signed)
[facility] Gastroenterology Patient Name: Sandra Wilkerson Procedure Date: 11/07/2021 11:16 AM MRN: [id number] Account #: [id number] Date of Birth: 10-Feb-1996 Admit Type: Inpatient Age: 26 Room: [facility] ENDO ROOM 4 Gender: Female Note Status: Finalized Instrument Name: Nolon Stalls [id number] Procedure:             ERCP Indications:           Bile duct stone(s) Providers:             Midge Minium MD, MD Referring MD:          Sunnie Nielsen MD (Referring MD) Medicines:             General Anesthesia Complications:         No immediate complications. Procedure:             Pre-Anesthesia Assessment:                        - Prior to the procedure, a History and Physical was                         performed, and patient medications and allergies were                         reviewed. The patient's tolerance of previous                         anesthesia was also reviewed. The risks and benefits                         of the procedure and the sedation options and risks                         were discussed with the patient. All questions were                         answered, and informed consent was obtained. Prior                         Anticoagulants: The patient has taken no previous                         anticoagulant or antiplatelet agents. ASA Grade                         Assessment: II - A patient with mild systemic disease.                         After reviewing the risks and benefits, the patient                         was deemed in satisfactory condition to undergo the                         procedure.                        After obtaining informed consent, the scope was passed  under direct vision. Throughout the procedure, the                         patient's blood pressure, pulse, and oxygen                         saturations were monitored continuously. The                         Duodenoscope was introduced through the  mouth, and                         used to inject contrast into and used to inject                         contrast into the bile duct. The ERCP was accomplished                         without difficulty. The patient tolerated the                         procedure well. Findings:      The scout film was normal. The esophagus was successfully intubated       under direct vision. The scope was advanced to a normal major papilla in       the descending duodenum without detailed examination of the pharynx,       larynx and associated structures, and upper GI tract. The upper GI tract       was grossly normal. The bile duct was deeply cannulated with the       short-nosed traction sphincterotome. Contrast was injected. I personally       interpreted the bile duct images. There was brisk flow of contrast       through the ducts. Image quality was excellent. Contrast extended to the       entire biliary tree. The lower third of the main bile duct contained       filling defect(s) thought to be a stone. A wire was passed into the       biliary tree. A 7 mm biliary sphincterotomy was made with a traction       (standard) sphincterotome using ERBE electrocautery. There was no       post-sphincterotomy bleeding. The biliary tree was swept with a 15 mm       balloon starting at the bifurcation. One stone was removed. No stones       remained. Impression:            - A filling defect consistent with a stone was seen on                         the cholangiogram.                        - Choledocholithiasis was found. Complete removal was                         accomplished by biliary sphincterotomy and balloon                         extraction.                        -  A biliary sphincterotomy was performed.                        - The biliary tree was swept. Recommendation:        - Return patient to hospital ward for ongoing care.                        - Clear liquid diet today.                         - Watch for pancreatitis, bleeding, perforation, and                         cholangitis. Procedure Code(s):     --- Professional ---                        [phone], Endoscopic retrograde cholangiopancreatography                         (ERCP); with removal of calculi/debris from                         biliary/pancreatic duct(s)                        [id number], Endoscopic retrograde cholangiopancreatography                         (ERCP); with sphincterotomy/papillotomy                        [phone], Endoscopic catheterization of the biliary                         ductal system, radiological supervision and                         interpretation Diagnosis Code(s):     --- Professional ---                        K80.50, Calculus of bile duct without cholangitis or                         cholecystitis without obstruction CPT copyright 2019 American Medical Association. All rights reserved. The codes documented in this report are preliminary and upon coder review may  be revised to meet current compliance requirements. Midge Minium MD, MD 11/07/2021 12:32:37 PM This report has been signed electronically. Number of Addenda: 0 Note Initiated On: 11/07/2021 11:16 AM Estimated Blood Loss:  Estimated blood loss: none.      [facility]

## 2021-11-07 NOTE — Anesthesia Procedure Notes (Signed)
Procedure Name: Intubation Date/Time: 11/07/2021 11:49 AM Performed by: Lynden Oxford, CRNA Pre-anesthesia Checklist: Patient identified, Emergency Drugs available, Suction available and Patient being monitored Patient Re-evaluated:Patient Re-evaluated prior to induction Oxygen Delivery Method: Circle system utilized Preoxygenation: Pre-oxygenation with 100% oxygen Induction Type: IV induction Ventilation: Mask ventilation without difficulty Laryngoscope Size: McGraph and 3 Grade View: Grade I Tube type: Oral Tube size: 7.0 mm Number of attempts: 1 Airway Equipment and Method: Stylet and Video-laryngoscopy Placement Confirmation: ETT inserted through vocal cords under direct vision, positive ETCO2 and breath sounds checked- equal and bilateral Secured at: 20 cm Tube secured with: Tape Dental Injury: Teeth and Oropharynx as per pre-operative assessment

## 2021-11-08 ENCOUNTER — Encounter: Payer: Self-pay | Admitting: Family Medicine

## 2021-11-08 ENCOUNTER — Other Ambulatory Visit: Payer: Self-pay

## 2021-11-08 ENCOUNTER — Inpatient Hospital Stay: Payer: Medicaid Other | Admitting: Anesthesiology

## 2021-11-08 ENCOUNTER — Encounter
Admission: EM | Disposition: A | Discharge status: Home / Self Care | Payer: Self-pay | Source: Home / Self Care | Attending: Osteopathic Medicine

## 2021-11-08 DIAGNOSIS — K8064 Calculus of gallbladder and bile duct with chronic cholecystitis without obstruction: Secondary | ICD-10-CM

## 2021-11-08 LAB — CBC
HCT: 29.5 % — ABNORMAL LOW (ref 36.0–46.0)
Hemoglobin: 8.9 g/dL — ABNORMAL LOW (ref 12.0–15.0)
MCH: 20.6 pg — ABNORMAL LOW (ref 26.0–34.0)
MCHC: 30.2 g/dL (ref 30.0–36.0)
MCV: 68.1 fL — ABNORMAL LOW (ref 80.0–100.0)
Platelets: 339 10*3/uL (ref 150–400)
RBC: 4.33 MIL/uL (ref 3.87–5.11)
RDW: 16.4 % — ABNORMAL HIGH (ref 11.5–15.5)
WBC: 8.2 10*3/uL (ref 4.0–10.5)
nRBC: 0 % (ref 0.0–0.2)

## 2021-11-08 LAB — COMPREHENSIVE METABOLIC PANEL
ALT: 239 U/L — ABNORMAL HIGH (ref 0–44)
AST: 108 U/L — ABNORMAL HIGH (ref 15–41)
Albumin: 3 g/dL — ABNORMAL LOW (ref 3.5–5.0)
Alkaline Phosphatase: 181 U/L — ABNORMAL HIGH (ref 38–126)
Anion gap: 7 (ref 5–15)
BUN: 5 mg/dL — ABNORMAL LOW (ref 6–20)
CO2: 23 mmol/L (ref 22–32)
Calcium: 8.4 mg/dL — ABNORMAL LOW (ref 8.9–10.3)
Chloride: 106 mmol/L (ref 98–111)
Creatinine, Ser: 0.53 mg/dL (ref 0.44–1.00)
GFR, Estimated: >60 mL/min (ref >60)
Glucose, Bld: 85 mg/dL (ref 70–99)
Potassium: 3.6 mmol/L (ref 3.5–5.1)
Sodium: 136 mmol/L (ref 135–145)
Total Bilirubin: 0.7 mg/dL (ref 0.3–1.2)
Total Protein: 6.6 g/dL (ref 6.5–8.1)

## 2021-11-08 LAB — SURGICAL PCR SCREEN
MRSA, PCR: NEGATIVE
Staphylococcus aureus: POSITIVE — AB

## 2021-11-08 SURGERY — CHOLECYSTECTOMY, ROBOT-ASSISTED, LAPAROSCOPIC
Anesthesia: General

## 2021-11-08 MED ORDER — BUPIVACAINE-EPINEPHRINE (PF) 0.25% -1:200000 IJ SOLN
INTRAMUSCULAR | Status: DC | PRN
  Administered 2021-11-08: 50 mL

## 2021-11-08 MED ORDER — FENTANYL CITRATE (PF) 100 MCG/2ML IJ SOLN
25.0000 ug | INTRAMUSCULAR | Status: DC | PRN
  Administered 2021-11-08 (×3): 25 ug via INTRAVENOUS

## 2021-11-08 MED ORDER — LACTATED RINGERS IV SOLN
INTRAVENOUS | Status: DC | PRN
Start: 2021-11-08 — End: 2021-11-08

## 2021-11-08 MED ORDER — CHLORHEXIDINE GLUCONATE CLOTH 2 % EX PADS
6.0000 | MEDICATED_PAD | Freq: Every day | CUTANEOUS | Status: DC
  Administered 2021-11-08: 6 via TOPICAL

## 2021-11-08 MED ORDER — ONDANSETRON HCL 4 MG/2ML IJ SOLN
INTRAMUSCULAR | Status: DC | PRN
  Administered 2021-11-08: 4 mg via INTRAVENOUS

## 2021-11-08 MED ORDER — BUPIVACAINE-EPINEPHRINE (PF) 0.25% -1:200000 IJ SOLN
INTRAMUSCULAR | Status: AC
  Filled 2021-11-08: qty 30

## 2021-11-08 MED ORDER — LIDOCAINE HCL (CARDIAC) PF 100 MG/5ML IV SOSY
PREFILLED_SYRINGE | INTRAVENOUS | Status: DC | PRN
  Administered 2021-11-08: 100 mg via INTRAVENOUS

## 2021-11-08 MED ORDER — ROCURONIUM BROMIDE 10 MG/ML (PF) SYRINGE
PREFILLED_SYRINGE | INTRAVENOUS | Status: AC
  Filled 2021-11-08: qty 10

## 2021-11-08 MED ORDER — LIDOCAINE HCL (PF) 2 % IJ SOLN
INTRAMUSCULAR | Status: AC
  Filled 2021-11-08: qty 5

## 2021-11-08 MED ORDER — CEFOTETAN DISODIUM 2 G IJ SOLR
2.0000 g | INTRAMUSCULAR | Status: AC
  Administered 2021-11-08: 2 g via INTRAVENOUS
  Filled 2021-11-08: qty 2

## 2021-11-08 MED ORDER — ONDANSETRON HCL 4 MG/2ML IJ SOLN
INTRAMUSCULAR | Status: AC
  Filled 2021-11-08: qty 2

## 2021-11-08 MED ORDER — KETOROLAC TROMETHAMINE 30 MG/ML IJ SOLN
INTRAMUSCULAR | Status: DC | PRN
Start: 2021-11-08 — End: 2021-11-08
  Administered 2021-11-08: 30 mg via INTRAVENOUS

## 2021-11-08 MED ORDER — DEXMEDETOMIDINE (PRECEDEX) IN NS 20 MCG/5ML (4 MCG/ML) IV SYRINGE
PREFILLED_SYRINGE | INTRAVENOUS | Status: DC | PRN
  Administered 2021-11-08: 4 ug via INTRAVENOUS
  Administered 2021-11-08: 8 ug via INTRAVENOUS

## 2021-11-08 MED ORDER — MUPIROCIN 2 % EX OINT
1.0000 "application " | TOPICAL_OINTMENT | Freq: Two times a day (BID) | CUTANEOUS | Status: DC
  Administered 2021-11-08 – 2021-11-09 (×3): 1 via NASAL
  Filled 2021-11-08: qty 22

## 2021-11-08 MED ORDER — FENTANYL CITRATE (PF) 100 MCG/2ML IJ SOLN
INTRAMUSCULAR | Status: AC
  Administered 2021-11-08: 25 ug via INTRAVENOUS
  Filled 2021-11-08: qty 2

## 2021-11-08 MED ORDER — ROCURONIUM BROMIDE 100 MG/10ML IV SOLN
INTRAVENOUS | Status: DC | PRN
Start: 2021-11-08 — End: 2021-11-08
  Administered 2021-11-08: 70 mg via INTRAVENOUS

## 2021-11-08 MED ORDER — DEXMEDETOMIDINE HCL IN NACL 200 MCG/50ML IV SOLN
INTRAVENOUS | Status: AC
  Filled 2021-11-08: qty 50

## 2021-11-08 MED ORDER — 0.9 % SODIUM CHLORIDE (POUR BTL) OPTIME
TOPICAL | Status: DC | PRN
  Administered 2021-11-08: 75 mL

## 2021-11-08 MED ORDER — SEVOFLURANE IN SOLN
RESPIRATORY_TRACT | Status: AC
  Filled 2021-11-08: qty 250

## 2021-11-08 MED ORDER — MIDAZOLAM HCL 2 MG/2ML IJ SOLN
INTRAMUSCULAR | Status: AC
  Filled 2021-11-08: qty 2

## 2021-11-08 MED ORDER — ACETAMINOPHEN 10 MG/ML IV SOLN
INTRAVENOUS | Status: AC
  Filled 2021-11-08: qty 100

## 2021-11-08 MED ORDER — ONDANSETRON HCL 4 MG/2ML IJ SOLN: 4.0000 mg | Freq: Once | INTRAMUSCULAR | Status: DC | PRN

## 2021-11-08 MED ORDER — PHENYLEPHRINE HCL-NACL 20-0.9 MG/250ML-% IV SOLN
INTRAVENOUS | Status: AC
  Filled 2021-11-08: qty 250

## 2021-11-08 MED ORDER — FENTANYL CITRATE (PF) 100 MCG/2ML IJ SOLN
INTRAMUSCULAR | Status: AC
  Filled 2021-11-08: qty 2

## 2021-11-08 MED ORDER — ACETAMINOPHEN 10 MG/ML IV SOLN
INTRAVENOUS | Status: DC | PRN
  Administered 2021-11-08: 1000 mg via INTRAVENOUS

## 2021-11-08 MED ORDER — DEXAMETHASONE SODIUM PHOSPHATE 10 MG/ML IJ SOLN
INTRAMUSCULAR | Status: AC
  Filled 2021-11-08: qty 1

## 2021-11-08 MED ORDER — FENTANYL CITRATE (PF) 100 MCG/2ML IJ SOLN
INTRAMUSCULAR | Status: DC | PRN
  Administered 2021-11-08: 100 ug via INTRAVENOUS

## 2021-11-08 MED ORDER — MIDAZOLAM HCL 2 MG/2ML IJ SOLN
INTRAMUSCULAR | Status: DC | PRN
  Administered 2021-11-08: 2 mg via INTRAVENOUS

## 2021-11-08 MED ORDER — KETOROLAC TROMETHAMINE 30 MG/ML IJ SOLN
INTRAMUSCULAR | Status: AC
  Filled 2021-11-08: qty 1

## 2021-11-08 MED ORDER — DEXAMETHASONE SODIUM PHOSPHATE 10 MG/ML IJ SOLN
INTRAMUSCULAR | Status: DC | PRN
  Administered 2021-11-08: 10 mg via INTRAVENOUS

## 2021-11-08 MED ORDER — BUPIVACAINE LIPOSOME 1.3 % IJ SUSP
INTRAMUSCULAR | Status: AC
  Filled 2021-11-08: qty 20

## 2021-11-08 MED ORDER — PROPOFOL 10 MG/ML IV BOLUS
INTRAVENOUS | Status: DC | PRN
  Administered 2021-11-08: 200 mg via INTRAVENOUS

## 2021-11-08 MED ORDER — PROPOFOL 10 MG/ML IV BOLUS
INTRAVENOUS | Status: AC
  Filled 2021-11-08: qty 20

## 2021-11-08 MED ORDER — SUGAMMADEX SODIUM 200 MG/2ML IV SOLN
INTRAVENOUS | Status: DC | PRN
Start: 2021-11-08 — End: 2021-11-08
  Administered 2021-11-08: 200 mg via INTRAVENOUS

## 2021-11-08 SURGICAL SUPPLY — 44 items
CANNULA CAP OBTURATR AIRSEAL 8 (CAP) ×2 IMPLANT
CLIP LIGATING HEM O LOK PURPLE (MISCELLANEOUS) ×3 IMPLANT
COVER TIP SHEARS 8 DVNC (MISCELLANEOUS) ×1 IMPLANT
COVER TIP SHEARS 8MM DA VINCI (MISCELLANEOUS) ×1
DECANTER SPIKE VIAL GLASS SM (MISCELLANEOUS) ×2 IMPLANT
DERMABOND ADVANCED (GAUZE/BANDAGES/DRESSINGS) ×1
DERMABOND ADVANCED .7 DNX12 (GAUZE/BANDAGES/DRESSINGS) ×1 IMPLANT
DRAPE ARM DVNC X/XI (DISPOSABLE) ×4 IMPLANT
DRAPE COLUMN DVNC XI (DISPOSABLE) ×1 IMPLANT
DRAPE DA VINCI XI ARM (DISPOSABLE) ×4
DRAPE DA VINCI XI COLUMN (DISPOSABLE) ×1
ELECT CAUTERY BLADE 6.4 (BLADE) ×1 IMPLANT
GLOVE SURG ORTHO LTX SZ7.5 (GLOVE) ×4 IMPLANT
GOWN STRL REUS W/ TWL LRG LVL3 (GOWN DISPOSABLE) ×4 IMPLANT
GOWN STRL REUS W/TWL LRG LVL3 (GOWN DISPOSABLE) ×4
GRASPER SUT TROCAR 14GX15 (MISCELLANEOUS) IMPLANT
INFUSOR MANOMETER BAG 3000ML (MISCELLANEOUS) IMPLANT
IRRIGATION STRYKERFLOW (MISCELLANEOUS) IMPLANT
IRRIGATOR STRYKERFLOW (MISCELLANEOUS)
IRRIGATOR SUCT 8 DISP DVNC XI (IRRIGATION / IRRIGATOR) IMPLANT
IRRIGATOR SUCTION 8MM XI DISP (IRRIGATION / IRRIGATOR)
IV NS IRRIG 3000ML ARTHROMATIC (IV SOLUTION) IMPLANT
KIT PINK PAD W/HEAD ARE REST (MISCELLANEOUS) ×2
KIT PINK PAD W/HEAD ARM REST (MISCELLANEOUS) ×1 IMPLANT
KIT TURNOVER KIT A (KITS) ×1 IMPLANT
LABEL OR SOLS (LABEL) ×2 IMPLANT
MANIFOLD NEPTUNE II (INSTRUMENTS) ×2 IMPLANT
NDL INSUFFLATION 14GA 120MM (NEEDLE) IMPLANT
NEEDLE HYPO 22GX1.5 SAFETY (NEEDLE) ×2 IMPLANT
NEEDLE INSUFFLATION 14GA 120MM (NEEDLE) IMPLANT
NS IRRIG 500ML POUR BTL (IV SOLUTION) ×2 IMPLANT
PACK LAP CHOLECYSTECTOMY (MISCELLANEOUS) ×2 IMPLANT
PENCIL ELECTRO HAND CTR (MISCELLANEOUS) ×1 IMPLANT
POUCH SPECIMEN RETRIEVAL 10MM (ENDOMECHANICALS) ×2 IMPLANT
SCISSORS METZENBAUM CVD 33 (INSTRUMENTS) ×1 IMPLANT
SEAL CANN UNIV 5-8 DVNC XI (MISCELLANEOUS) ×3 IMPLANT
SEAL XI 5MM-8MM UNIVERSAL (MISCELLANEOUS) ×3
SET TUBE FILTERED XL AIRSEAL (SET/KITS/TRAYS/PACK) ×2 IMPLANT
SOLUTION ELECTROLUBE (MISCELLANEOUS) ×2 IMPLANT
SUT MNCRL 4-0 (SUTURE) ×1
SUT MNCRL 4-0 27XMFL (SUTURE) ×1
SUT VICRYL 0 AB UR-6 (SUTURE) ×2 IMPLANT
SUTURE MNCRL 4-0 27XMF (SUTURE) ×1 IMPLANT
TROCAR Z-THREAD FIOS 11X100 BL (TROCAR) ×1 IMPLANT

## 2021-11-08 NOTE — Progress Notes (Signed)
[place] [facility] Day(s): 2.   Post op day(s): 1 Day Post-Op.   Interval History:  Patient seen and examined No acute events or new complaints overnight.  Patient reports she is feeling better after ERCP; she had some abdominal soreness but this is resolved She remains without leukocytosis; WBC 8.2K Renal function normal; sCr - 0.53; UO - unmeasured LFTS improving; hyperbilirubinemia resolved S/P ERCP yesterday (01/30) with Dr Allen Norris; reviewed; stone removed She tolerated CLD yesterday; NPO this morning Plan for cholecystectomy this afternoon with Dr Christian Mate   Vital signs in last 24 hours: [min-max] current  Temp:  [97.3 F (36.3 C)-99.8 F (37.7 C)] 98.9 F (37.2 C) (01/31 [id number]) Pulse Rate:  [68-87] 68 (01/31 0632) Resp:  [15-18] 18 (01/31 [id number]) BP: (113-126)/(69-90) 126/82 (01/31 [id number]) SpO2:  [99 %-100 %] 100 % (01/31 [id number])     Height: 4\' 11"  (149.9 cm) Weight: 75.8 kg BMI (Calculated): 33.75   Intake/Output last 2 shifts:  01/30 0701 - 01/31 0700 In: [id number] [P.O.:120; I.V.:1669] Out: 300 [Urine:300]   Physical Exam:  Constitutional: alert, cooperative and no distress  Respiratory: breathing non-labored at rest  Cardiovascular: regular rate and sinus rhythm  Gastrointestinal: soft, non-tender, and non-distended Integumentary: warm, dry   Labs:  CBC Latest Ref Rng & Units 11/08/2021 11/07/2021 11/05/2021  WBC 4.0 - 10.5 K/uL 8.2 6.1 7.4  Hemoglobin 12.0 - 15.0 g/dL 8.9(L) 8.9(L) 10.1(L)  Hematocrit 36.0 - 46.0 % 29.5(L) 30.3(L) 33.7(L)  Platelets 150 - 400 K/uL 339 295 351   CMP Latest Ref Rng & Units 11/08/2021 11/05/2021 08/04/2021  Glucose 70 - 99 mg/dL 85 108(H) 93  BUN 6 - 20 mg/dL 5(L) 13 16  Creatinine 0.44 - 1.00 mg/dL 0.53 0.68 0.67  Sodium 135 - 145 mmol/L 136 133(L) 137  Potassium 3.5 - 5.1 mmol/L 3.6 3.4(L) 3.6  Chloride 98 - 111 mmol/L 106 104 107  CO2 22 - 32 mmol/L 23 23 23   Calcium 8.9 - 10.3 mg/dL 8.4(L)  8.6(L) 8.8(L)  Total Protein 6.5 - 8.1 g/dL 6.6 8.0 7.6  Total Bilirubin 0.3 - 1.2 mg/dL 0.7 1.4(H) 0.6  Alkaline Phos 38 - 126 U/L 181(H) 160(H) 95  AST 15 - 41 U/L 108(H) 451(H) 19  ALT 0 - 44 U/L 239(H) 363(H) 15     Imaging studies: No new pertinent imaging studies   Assessment/Plan: (ICD-10's: K10.20) 26 y.o. female with choledocholithiasis s/p ERCP (01/30)   - Plan for robotic assisted laparoscopic cholecystectomy this afternoon with Dr Christian Mate pending OR/Anesthesia availability - All risks, benefits, and alternatives to above procedure(s) were discussed with the patient, all of her questions were answered to her expressed satisfaction, patient expresses she wishes to proceed, and informed consent was obtained.    - ICG on call to OR - IV Abx (Cefotetan) on call to OR  - NPO + IVF Resuscitation  - Monitor abdominal examination; on-going bowel function   - Pain control prn; antiemetics prn - Mobilization as tolerated - Further management per primary service; we will follow    All of the above findings and recommendations were discussed with the patient, patient's family at bedside, and the medical team, and all of their questions were answered to their expressed satisfaction.  -- Edison Simon, PA-C [facility] 11/08/2021, 7:29 AM [phone] M-F: 7am - 4pm

## 2021-11-08 NOTE — Anesthesia Postprocedure Evaluation (Signed)
Anesthesia Post Note  Patient: Sandra Wilkerson  Procedure(s) Performed: XI ROBOTIC ASSISTED LAPAROSCOPIC CHOLECYSTECTOMY INDOCYANINE GREEN FLUORESCENCE IMAGING (ICG)  Patient location during evaluation: PACU Anesthesia Type: General Level of consciousness: awake and alert Pain management: pain level controlled Vital Signs Assessment: post-procedure vital signs reviewed and stable Respiratory status: spontaneous breathing, nonlabored ventilation, respiratory function stable and patient connected to nasal cannula oxygen Cardiovascular status: blood pressure returned to baseline and stable Postop Assessment: no apparent nausea or vomiting Anesthetic complications: no   No notable events documented.   Last Vitals:  Vitals:   11/08/21 1800 11/08/21 1815  BP: 130/88 123/80  Pulse: 70 73  Resp: (!) 24 (!) 21  Temp:    SpO2: 100% 98%    Last Pain:  Vitals:   11/08/21 1820  TempSrc:   PainSc: 5                  Corinda Gubler

## 2021-11-08 NOTE — Op Note (Signed)
Robotic cholecystectomy with Indocyamine Green Ductal Imaging.   Pre-operative Diagnosis: Chronic calculus cholecystitis, choledocholithiasis  Post-operative Diagnosis:  Same.  Procedure: Robotic assisted laparoscopic cholecystectomy with Indocyamine Green Ductal Imaging.   Surgeon: Campbell Lerner, M.D., FACS  Anesthesia: General. with endotracheal tube  Findings: As expected.  Estimated Blood Loss: 15 mL         Drains: None         Specimens: Gallbladder           Complications: none  Procedure Details  The patient was seen again in the Holding Room.  2.5 mg dose of ICG was administered intravenously.   The benefits, complications, treatment options, risks and expected outcomes were again reviewed with the patient. The likelihood of improving the patient's symptoms with return to their baseline status is good.  The patient and/or family concurred with the proposed plan, giving informed consent, again alternatives reviewed.  The patient was taken to Operating Room, identified, and the procedure verified as robotic assisted laparoscopic cholecystectomy.  Prior to the induction of general anesthesia, antibiotic prophylaxis was administered. VTE prophylaxis was in place. General endotracheal anesthesia was then administered and tolerated well. The patient was positioned in the supine position.  After the induction, the abdomen was prepped with Chloraprep and draped in the sterile fashion.  A Time Out was held and the above information confirmed.  After local infiltration of quarter percent Marcaine with epinephrine, stab incision was made left upper quadrant.  Just below the costal margin at Palmer's point, approximately midclavicular line the Veres needle is passed with sensation of the layers to penetrate the abdominal wall and into the peritoneum.  Saline drop test is confirmed peritoneal placement.  Insufflation is initiated with carbon dioxide to pressures of 15 mmHg.  Left lateral  abdominal wall infiltration with quarter percent Marcaine with epinephrine is utilized.  Made a 12 mm incision on the right periumbilical site, I advanced an optical 16mm port under direct visualization into the peritoneal cavity.  Once the peritoneum was penetrated, insufflation was continued.  The trocar was then advanced into the abdominal cavity under direct visualization.  Minimal anterior abdominal wall adhesions were then taken down with hot scissors.  Clearing the anterior abdominal wall for additional trocar placement.   Pneumoperitoneum was then continued with Air seal utilizing CO2 at 15 mmHg or less and tolerated well without any adverse changes in the patient's vital signs.  Two 8.5-mm ports were placed in the right lower quadrant and laterally, and one to the left lower quadrant, all under direct vision. All skin incisions  were infiltrated with a local anesthetic agent before making the incision and placing the trocars.  The patient was positioned  in reverse Trendelenburg, tilted the patient's left side down.  Da Vinci XI robot was then positioned on to the patient's left side, and docked.  The gallbladder was identified, the fundus grasped via the arm 4 Prograsp and retracted cephalad. Adhesions were lysed with scissors and cautery.  The infundibulum was identified grasped and retracted laterally, exposing the peritoneum overlying the triangle of Calot. This was then opened and dissected using cautery & scissors. An extended critical view of the cystic duct and cystic artery was obtained, aided by the ICG via FireFly which enabled ready visualization of the ductal anatomy.    The cystic duct was clearly identified and dissected to isolation.   Artery well isolated and clipped, and the cystic duct was triple clipped and divided with scissors, as close to  the gallbladder neck as feasible, thus leaving two on the remaining stump.  The specimen side of the artery is sealed with bipolar and  divided with monopolar scissors.   The gallbladder was taken from the gallbladder fossa in a retrograde fashion with the electrocautery. The gallbladder was removed and placed in an Endocatch bag.  The liver bed is inspected. Hemostasis was confirmed.  The robot was undocked and moved away from the operative field. No irrigation was utilized. The gallbladder and Endocatch sac were then removed through the 11 mm port site.  Without any dilatation necessary.  Inspection of the right upper quadrant was performed. No bleeding, bile duct injury or leak, or bowel injury was noted. The 11 mm port site fascia was closed with interrumpted 0 Vicryl sutures using PMI/cone under direct visualization. Pneumoperitoneum was released and ports removed.  4-0 subcuticular Monocryl was used to close the skin. Dermabond was  applied.  The patient was then extubated and brought to the recovery room in stable condition. Sponge, lap, and needle counts were correct at closure and at the conclusion of the case.               Campbell Lerner, M.D., [facility] 11/08/2021 5:47 PM

## 2021-11-08 NOTE — Anesthesia Preprocedure Evaluation (Signed)
Anesthesia Evaluation  Patient identified by MRN, date of birth, ID band Patient awake    Reviewed: Allergy & Precautions, NPO status , Patient's Chart, lab work & pertinent test results  Airway Mallampati: III  TM Distance: >3 FB Neck ROM: Full    Dental  (+) Teeth Intact   Pulmonary neg pulmonary ROS, asthma ,    Pulmonary exam normal breath sounds clear to auscultation       Cardiovascular Exercise Tolerance: Good negative cardio ROS Normal cardiovascular exam Rhythm:Regular Rate:Normal     Neuro/Psych  Headaches, negative neurological ROS  negative psych ROS   GI/Hepatic negative GI ROS, Neg liver ROS,   Endo/Other  negative endocrine ROS  Renal/GU negative Renal ROS  negative genitourinary   Musculoskeletal   Abdominal Normal abdominal exam  (+) + obese,   Peds negative pediatric ROS (+)  Hematology negative hematology ROS (+)   Anesthesia Other Findings Past Medical History: No date: Crohn disease ([place]) No date: PCOS (polycystic ovarian syndrome)  Past Surgical History: No date: APPENDECTOMY 07/15/2021: CESAREAN SECTION     Comment:  Procedure: CESAREAN SECTION;  Surgeon: Harlin Heys, MD;  Location: [facility] ORS;  Service: Obstetrics;; No date: COLECTOMY No date: COLOSTOMY No date: COLOSTOMY REVERSAL 11/07/2021: ERCP; N/A     Comment:  Procedure: ENDOSCOPIC RETROGRADE               CHOLANGIOPANCREATOGRAPHY (ERCP);  Surgeon: Lucilla Lame,               MD;  Location: [facility] ENDOSCOPY;  Service: Endoscopy;                Laterality: N/A;  BMI    Body Mass Index: 33.73 kg/m      Reproductive/Obstetrics negative OB ROS                             Anesthesia Physical Anesthesia Plan  ASA: 2  Anesthesia Plan: General   Post-op Pain Management:    Induction: Intravenous  PONV Risk Score and Plan: 2 and Ondansetron and Dexamethasone  Airway Management  Planned: Oral ETT  Additional Equipment:   Intra-op Plan:   Post-operative Plan: Extubation in OR  Informed Consent: I have reviewed the patients History and Physical, chart, labs and discussed the procedure including the risks, benefits and alternatives for the proposed anesthesia with the patient or authorized representative who has indicated his/her understanding and acceptance.     Dental Advisory Given  Plan Discussed with: CRNA and Surgeon  Anesthesia Plan Comments:         Anesthesia Quick Evaluation

## 2021-11-08 NOTE — Progress Notes (Addendum)
PROGRESS NOTE    Sandra Wilkerson  [id number] DOB: September 13, 1996 DOA: 11/06/2021 PCP: Danelle Berry, NP Outpatient Specialists:    Brief Narrative:   Sandra Wilkerson is a 26 year old female with medical history significant for Crohn's, GERD, asthma, PCOS.  She presented to the ED 11/06/2021, with acute onset severe upper abdominal pain and radiation to the back.  Nausea/vomiting/.  No bilious vomitus or hematemesis, no melena or BRBPR.  Normal vital signs in ED, hyponatremia 133, hypokalemia 3.4, elevated AST 451 and ALT 363 anemia with hemoglobin 10.9 slightly lower than previous levels, no concerns on EKG, right upper quadrant ultrasound demonstrated cholelithiasis, common bile duct was dilated to 8 mm with suspected mild intrahepatic ductal dilatation, MRCP revealed cholelithiasis and choledocholithiasis.  Patient was treated with Zofran, morphine, IV fluids.  Seen by gastroenterology and surgery.  Gastroenterology planning for ERCP , 11/07/2021, surgery planning for cholecystectomy 11/08/2021   Yesterday 11/07/21: Complaints this morning of some worsening abdominal pain, no pain meds this was corrected.  Nausea medications are on board.  Patient taken for ERCP,  removal of calculi/debris from biliary/pancreatic ducts, see gastroenterology note for full details.   Today 11/08/21 pain is controlled, feeling well. Chole today     Assessment & Plan:   Principal Problem:   Choledocholithiasis Active Problems:   Crohn's colitis ([place])   Iron deficiency    Choledocholithiasis 11/07/2021: ERCP with removal of calculi/debris from biliary/pancreatic ducts Planning for cholecystectomy tomorrow Pain controlled   Crohn's colitis Stable, GI following   Iron deficiency Stable, following serial CBC       DVT prophylaxis: Lovenox Code Status: FULL Family Communication: partner at bedside this AM Disposition Plan: home pending surgery tomorrow      Consultants:   Gastroenterology General surgery   Procedures: 11/07/2021: ERCP with removal of calculi/debris from biliary/pancreatic ducts 11/08/2021 plan for robotic assisted lap chole      Antimicrobials:  Anti-infectives (From admission, onward)    Start     Dose/Rate Route Frequency Ordered Stop   11/08/21 1000  ceFAZolin (ANCEF) IVPB 2g/100 mL premix  Status:  Discontinued        2 g 200 mL/hr over 30 Minutes Intravenous On call to O.R. 11/06/21 1427 11/06/21 1431   11/08/21 0830  [MAR Hold]  cefoTEtan (CEFOTAN) 2 g in sodium chloride 0.9 % 100 mL IVPB        (MAR Hold since Tue 11/08/2021 at 1433.Hold Reason: Transfer to a Procedural area)   2 g 200 mL/hr over 30 Minutes Intravenous On call to O.R. 11/08/21 0733 11/08/21 1702   11/08/21 0000  ceFAZolin (ANCEF) powder 2 g  Status:  Discontinued        2 g Other To Surgery 11/06/21 1419 11/06/21 1424   11/07/21 1145  ampicillin-sulbactam (UNASYN) 1.5 g in sodium chloride 0.9 % 100 mL IVPB  Status:  Discontinued        1.5 g 200 mL/hr over 30 Minutes Intravenous  Once 11/07/21 1045 11/07/21 1401   11/07/21 0600  ceFAZolin (ANCEF) IVPB 2g/100 mL premix        2 g 200 mL/hr over 30 Minutes Intravenous On call to O.R. 11/06/21 1431 11/08/21 0559        Subjective: Patient reports doing well overnight, pain is better since ERCP  Objective: Vitals:   11/07/21 2333 11/08/21 0632 11/08/21 0811 11/08/21 1440  BP: 125/81 126/82 126/83 130/85  Pulse: 87 68 61 60  Resp: 18 18  18   Temp: 99.8  F (37.7 C) 98.9 F (37.2 C) 98.2 F (36.8 C) 97.9 F (36.6 C)  TempSrc: Oral Oral  Temporal  SpO2: 100% 100% 100% 98%  Weight:    75.8 kg  Height:    4\' 11"  (1.499 m)    Intake/Output Summary (Last 24 hours) at 11/08/2021 1721 Last data filed at 11/08/2021 1632 Gross per 24 hour  Intake 1796.06 ml  Output --  Net 1796.06 ml   Filed Weights   11/05/21 2251 11/06/21 1716 11/08/21 1440  Weight: 77.1 kg 75.8 kg 75.8 kg     Examination:  General exam: Appears calm and comfortable  Respiratory system: Clear to auscultation. Respiratory effort normal. Cardiovascular system: S1 & S2 heard, RRR. Gastrointestinal system: Abdomen is nondistended Central nervous system: Alert and oriented. No focal neurological deficits. Extremities: Symmetric 5 x 5 power. Skin: No rashes, lesions or ulcers Psychiatry: Judgement and insight appear normal. Mood & affect appropriate.     Data Reviewed: I have personally reviewed following labs and imaging studies  CBC: Recent Labs  Lab 11/05/21 2253 11/07/21 0507 11/08/21 0520  WBC 7.4 6.1 8.2  HGB 10.1* 8.9* 8.9*  HCT 33.7* 30.3* 29.5*  MCV 70.1* 70.6* 68.1*  PLT 351 295 [id number]   Basic Metabolic Panel: Recent Labs  Lab 11/05/21 2253 11/08/21 0520  NA 133* 136  K 3.4* 3.6  CL 104 106  CO2 23 23  GLUCOSE 108* 85  BUN 13 5*  CREATININE 0.68 0.53  CALCIUM 8.6* 8.4*   GFR: Estimated Creatinine Clearance: 92.9 mL/min (by C-G formula based on SCr of 0.53 mg/dL). Liver Function Tests: Recent Labs  Lab 11/05/21 2253 11/08/21 0520  AST 451* 108*  ALT 363* 239*  ALKPHOS 160* 181*  BILITOT 1.4* 0.7  PROT 8.0 6.6  ALBUMIN 3.5 3.0*   Recent Labs  Lab 11/05/21 2253  LIPASE 49   No results for input(s): AMMONIA in the last 168 hours. Coagulation Profile: No results for input(s): INR, PROTIME in the last 168 hours. Cardiac Enzymes: No results for input(s): CKTOTAL, CKMB, CKMBINDEX, TROPONINI in the last 168 hours. BNP (last 3 results) No results for input(s): PROBNP in the last 8760 hours. HbA1C: No results for input(s): HGBA1C in the last 72 hours. CBG: No results for input(s): GLUCAP in the last 168 hours. Lipid Profile: No results for input(s): CHOL, HDL, LDLCALC, TRIG, CHOLHDL, LDLDIRECT in the last 72 hours. Thyroid Function Tests: No results for input(s): TSH, T4TOTAL, FREET4, T3FREE, THYROIDAB in the last 72 hours. Anemia Panel: No results  for input(s): VITAMINB12, FOLATE, FERRITIN, TIBC, IRON, RETICCTPCT in the last 72 hours. Urine analysis:    Component Value Date/Time   COLORURINE YELLOW 11/05/2021 2253   APPEARANCEUR CLEAR 11/05/2021 2253   APPEARANCEUR Cloudy (A) 12/21/2020 1440   LABSPEC >1.030 (H) 11/05/2021 2253   PHURINE 5.5 11/05/2021 2253   GLUCOSEU NEGATIVE 11/05/2021 2253   HGBUR NEGATIVE 11/05/2021 2253   BILIRUBINUR MODERATE (A) 11/05/2021 2253   BILIRUBINUR neg 07/13/2021 1330   BILIRUBINUR Negative 12/21/2020 1440   KETONESUR NEGATIVE 11/05/2021 2253   PROTEINUR TRACE (A) 11/05/2021 2253   UROBILINOGEN 0.2 07/13/2021 1330   NITRITE NEGATIVE 11/05/2021 2253   LEUKOCYTESUR NEGATIVE 11/05/2021 2253   Sepsis Labs: @LABRCNTIP (procalcitonin:4,lacticidven:4)  Recent Results (from the past 240 hour(s))  Resp Panel by RT-PCR (Flu A&B, Covid) Nasopharyngeal Swab     Status: None   Collection Time: 11/06/21  5:56 AM   Specimen: Nasopharyngeal Swab; Nasopharyngeal(NP) swabs in vial transport medium  Result  Value Ref Range Status   SARS Coronavirus 2 by RT PCR NEGATIVE NEGATIVE Final    Comment: (NOTE) SARS-CoV-2 target nucleic acids are NOT DETECTED.  The SARS-CoV-2 RNA is generally detectable in upper respiratory specimens during the acute phase of infection. The lowest concentration of SARS-CoV-2 viral copies this assay can detect is 138 copies/mL. A negative result does not preclude SARS-Cov-2 infection and should not be used as the sole basis for treatment or other patient management decisions. A negative result may occur with  improper specimen collection/handling, submission of specimen other than nasopharyngeal swab, presence of viral mutation(s) within the areas targeted by this assay, and inadequate number of viral copies(<138 copies/mL). A negative result must be combined with clinical observations, patient history, and epidemiological information. The expected result is Negative.  Fact Sheet  for Patients:  EntrepreneurPulse.com.au  Fact Sheet for Healthcare Providers:  IncredibleEmployment.be  This test is no t yet approved or cleared by the [place] FDA and  has been authorized for detection and/or diagnosis of SARS-CoV-2 by FDA under an Emergency Use Authorization (EUA). This EUA will remain  in effect (meaning this test can be used) for the duration of the COVID-19 declaration under Section 564(b)(1) of the Act, 21 U.S.C.section 360bbb-3(b)(1), unless the authorization is terminated  or revoked sooner.       Influenza A by PCR NEGATIVE NEGATIVE Final   Influenza B by PCR NEGATIVE NEGATIVE Final    Comment: (NOTE) The Xpert Xpress SARS-CoV-2/FLU/RSV plus assay is intended as an aid in the diagnosis of influenza from Nasopharyngeal swab specimens and should not be used as a sole basis for treatment. Nasal washings and aspirates are unacceptable for Xpert Xpress SARS-CoV-2/FLU/RSV testing.  Fact Sheet for Patients: EntrepreneurPulse.com.au  Fact Sheet for Healthcare Providers: IncredibleEmployment.be  This test is not yet approved or cleared by the [place] FDA and has been authorized for detection and/or diagnosis of SARS-CoV-2 by FDA under an Emergency Use Authorization (EUA). This EUA will remain in effect (meaning this test can be used) for the duration of the COVID-19 declaration under Section 564(b)(1) of the Act, 21 U.S.C. section 360bbb-3(b)(1), unless the authorization is terminated or revoked.  Performed at [facility], [address] [place], [place] [address]   Surgical pcr screen     Status: Abnormal   Collection Time: 11/07/21  9:13 PM   Specimen: Nasal Mucosa; Nasal Swab  Result Value Ref Range Status   MRSA, PCR NEGATIVE NEGATIVE Final   Staphylococcus aureus POSITIVE (A) NEGATIVE Final    Comment: (NOTE) The Xpert SA Assay (FDA approved for NASAL  specimens in patients 74 years of age and older), is one component of a comprehensive surveillance program. It is not intended to diagnose infection nor to guide or monitor treatment. Performed at [facility], [address] [place], [place] [address]          Radiology Studies last 96 hours: DG C-Arm 1-60 Min-No Report  Result Date: 11/07/2021 Fluoroscopy was utilized by the requesting physician.  No radiographic interpretation.   MR ABDOMEN MRCP W WO CONTAST  Result Date: 11/06/2021 CLINICAL DATA:  26 year old female with history of right upper quadrant pain with cholelithiasis and biliary ductal dilatation noted on recent ultrasound examination. Follow-up study to evaluate for potential choledocholithiasis. EXAM: MRI ABDOMEN WITHOUT AND WITH CONTRAST (INCLUDING MRCP) TECHNIQUE: Multiplanar multisequence MR imaging of the abdomen was performed both before and after the administration of intravenous contrast. Heavily T2-weighted images of the biliary  and pancreatic ducts were obtained, and three-dimensional MRCP images were rendered by post processing. CONTRAST:  70mL GADAVIST GADOBUTROL 1 MMOL/ML IV SOLN COMPARISON:  No prior abdominal MRI. Abdominal ultrasound 11/06/2021. CT of the abdomen and pelvis 08/04/2021. FINDINGS: Lower chest: Unremarkable. Hepatobiliary: No suspicious cystic or solid hepatic lesions. Mild intra and extrahepatic biliary ductal dilatation noted on MRCP images. Common bile duct measures up to 8 mm in the porta hepatis. There is a tiny filling defect in the distal common bile duct (axial image 21 of series 4 and MRCP image 61 of series 13), compatible with choledocholithiasis, immediately before the level of the ampulla. Numerous tiny filling defects are also noted within the gallbladder, compatible with gallstones. Gallbladder is only moderately distended. Gallbladder wall thickness is normal. No significant volume of pericholecystic fluid or surrounding  inflammatory changes. Pancreas: No pancreatic mass. No pancreatic ductal dilatation. No pancreatic or peripancreatic fluid collections or inflammatory changes. Spleen:  Unremarkable. Adrenals/Urinary Tract: Bilateral kidneys and adrenal glands are normal in appearance. No hydroureteronephrosis in the visualized portions of the abdomen. Bilateral adrenal glands are normal in appearance. Stomach/Bowel: Unremarkable. Vascular/Lymphatic: No aneurysm identified in the visualized abdominal vasculature. No lymphadenopathy noted in the abdomen. Other: No significant volume of ascites noted in the visualized portions of the peritoneal cavity. Musculoskeletal: No aggressive appearing osseous lesions are noted in the visualized portions of the skeleton. IMPRESSION: 1. Study is positive for cholelithiasis and choledocholithiasis. No evidence of cholecystitis at this time. However, there is very mild intra and extrahepatic biliary ductal dilatation indicating obstruction at the level of the distal common bile duct from choledocholithiasis. Electronically Signed   By: Vinnie Langton M.D.   On: 11/06/2021 05:12   US ABDOMEN LIMITED RUQ (LIVER/GB)  Result Date: 11/06/2021 CLINICAL DATA:  Right upper quadrant abdominal pain EXAM: ULTRASOUND ABDOMEN LIMITED RIGHT UPPER QUADRANT COMPARISON:  None. FINDINGS: Gallbladder: Layering gallstones. No gallbladder wall thickening or pericholecystic fluid. Positive sonographic Murphy's sign. Common bile duct: Diameter: 8 mm Liver: No focal lesion identified. Within normal limits in parenchymal echogenicity. Suspected mild intrahepatic ductal dilatation. Portal vein is patent on color Doppler imaging with normal direction of blood flow towards the liver. Other: None. IMPRESSION: Cholelithiasis with positive sonographic Murphy's sign. However, there are no additional grayscale sonographic findings to suggest acute cholecystitis. Dilated common duct, measuring 8 mm. Suspected mild  intrahepatic ductal dilatation. This appearance raises the possibility of choledocholithiasis. Correlate with LFTs and consider MRCP or ERCP as clinically warranted. Electronically Signed   By: Julian Hy M.D.   On: 11/06/2021 01:49         Scheduled Meds:  [MAR Hold] Chlorhexidine Gluconate Cloth  6 each Topical Q0600   [MAR Hold] enoxaparin (LOVENOX) injection  40 mg Subcutaneous Q24H   [MAR Hold] mupirocin ointment  1 application Nasal BID   [MAR Hold] pantoprazole  20 mg Oral Daily   [MAR Hold] prenatal multivitamin  1 tablet Oral Daily   [MAR Hold] sucralfate  1 g Oral QID   Continuous Infusions:  0.9 % NaCl with KCl 20 mEq / L 100 mL/hr at 11/08/21 0908     LOS: 2 days      Emeterio Reeve, DO [facility]  If 7PM-7AM, please contact night-coverage [email or web address] 11/08/2021, 5:21 PM

## 2021-11-08 NOTE — Anesthesia Procedure Notes (Signed)
Procedure Name: Intubation Date/Time: 11/08/2021 4:32 PM Performed by: Elmarie Mainland, CRNA Pre-anesthesia Checklist: Patient identified, Emergency Drugs available, Suction available and Patient being monitored Patient Re-evaluated:Patient Re-evaluated prior to induction Oxygen Delivery Method: Circle system utilized Preoxygenation: Pre-oxygenation with 100% oxygen Induction Type: IV induction Ventilation: Mask ventilation without difficulty Laryngoscope Size: McGraph and 3 Grade View: Grade I Tube type: Oral Number of attempts: 1 Airway Equipment and Method: Stylet, Oral airway and Video-laryngoscopy Placement Confirmation: ETT inserted through vocal cords under direct vision, positive ETCO2 and breath sounds checked- equal and bilateral Secured at: 22 cm Tube secured with: Tape Dental Injury: Teeth and Oropharynx as per pre-operative assessment

## 2021-11-08 NOTE — Transfer of Care (Signed)
Immediate Anesthesia Transfer of Care Note  Patient: Sandra Wilkerson  Procedure(s) Performed: XI ROBOTIC ASSISTED LAPAROSCOPIC CHOLECYSTECTOMY INDOCYANINE GREEN FLUORESCENCE IMAGING (ICG)  Patient Location: PACU  Anesthesia Type:General  Level of Consciousness: drowsy and patient cooperative  Airway & Oxygen Therapy: Patient Spontanous Breathing and Patient connected to face mask oxygen  Post-op Assessment: Report given to RN and Post -op Vital signs reviewed and stable  Post vital signs: Reviewed and stable  Last Vitals:  Vitals Value Taken Time  BP 128/73 11/08/21 1754  Temp 36.6 C 11/08/21 1754  Pulse 70 11/08/21 1759  Resp 24 11/08/21 1759  SpO2 100 % 11/08/21 1759  Vitals shown include unvalidated device data.  Last Pain:  Vitals:   11/08/21 1440  TempSrc: Temporal  PainSc: 0-No pain      Patients Stated Pain Goal: 0 (11/08/21 1300)  Complications: No notable events documented.

## 2021-11-09 ENCOUNTER — Encounter: Payer: Self-pay | Admitting: Family Medicine

## 2021-11-09 LAB — CBC
HCT: 31 % — ABNORMAL LOW (ref 36.0–46.0)
Hemoglobin: 9.2 g/dL — ABNORMAL LOW (ref 12.0–15.0)
MCH: 20.5 pg — ABNORMAL LOW (ref 26.0–34.0)
MCHC: 29.7 g/dL — ABNORMAL LOW (ref 30.0–36.0)
MCV: 69 fL — ABNORMAL LOW (ref 80.0–100.0)
Platelets: 359 10*3/uL (ref 150–400)
RBC: 4.49 MIL/uL (ref 3.87–5.11)
RDW: 16.9 % — ABNORMAL HIGH (ref 11.5–15.5)
WBC: 9.8 10*3/uL (ref 4.0–10.5)
nRBC: 0 % (ref 0.0–0.2)

## 2021-11-09 LAB — COMPREHENSIVE METABOLIC PANEL
ALT: 200 U/L — ABNORMAL HIGH (ref 0–44)
AST: 80 U/L — ABNORMAL HIGH (ref 15–41)
Albumin: 2.9 g/dL — ABNORMAL LOW (ref 3.5–5.0)
Alkaline Phosphatase: 169 U/L — ABNORMAL HIGH (ref 38–126)
Anion gap: 6 (ref 5–15)
BUN: 7 mg/dL (ref 6–20)
CO2: 24 mmol/L (ref 22–32)
Calcium: 8.1 mg/dL — ABNORMAL LOW (ref 8.9–10.3)
Chloride: 106 mmol/L (ref 98–111)
Creatinine, Ser: 0.59 mg/dL (ref 0.44–1.00)
GFR, Estimated: >60 mL/min (ref >60)
Glucose, Bld: 102 mg/dL — ABNORMAL HIGH (ref 70–99)
Potassium: 4.1 mmol/L (ref 3.5–5.1)
Sodium: 136 mmol/L (ref 135–145)
Total Bilirubin: 0.7 mg/dL (ref 0.3–1.2)
Total Protein: 7 g/dL (ref 6.5–8.1)

## 2021-11-09 MED ORDER — HYDROCODONE-ACETAMINOPHEN 5-325 MG PO TABS: 1.0000 | ORAL_TABLET | ORAL | Status: DC | PRN

## 2021-11-09 MED ORDER — ENOXAPARIN SODIUM 40 MG/0.4ML IJ SOSY: 40.0000 mg | PREFILLED_SYRINGE | INTRAMUSCULAR | Status: DC

## 2021-11-09 MED ORDER — ACETAMINOPHEN 325 MG PO TABS: 650.0000 mg | ORAL_TABLET | Freq: Four times a day (QID) | ORAL | Status: DC | PRN

## 2021-11-09 MED ORDER — ZOLPIDEM TARTRATE 5 MG PO TABS: 5.0000 mg | ORAL_TABLET | Freq: Every evening | ORAL | Status: DC | PRN

## 2021-11-09 MED ORDER — HYDROCODONE-ACETAMINOPHEN 5-325 MG PO TABS: 1.0000 | ORAL_TABLET | Freq: Four times a day (QID) | ORAL | 0 refills | Status: DC | PRN

## 2021-11-09 MED ORDER — OXYCODONE HCL 5 MG PO TABS
5.0000 mg | ORAL_TABLET | ORAL | Status: DC | PRN
  Administered 2021-11-09: 10 mg via ORAL
  Filled 2021-11-09: qty 2

## 2021-11-09 MED ORDER — OXYCODONE HCL 5 MG PO TABS
5.0000 mg | ORAL_TABLET | ORAL | Status: DC | PRN
  Administered 2021-11-09 (×2): 5 mg via ORAL
  Filled 2021-11-09 (×2): qty 1

## 2021-11-09 MED ORDER — IBUPROFEN 600 MG PO TABS: 600.0000 mg | ORAL_TABLET | Freq: Four times a day (QID) | ORAL | 0 refills | Status: DC | PRN

## 2021-11-09 NOTE — Discharge Summary (Signed)
Physician Discharge Summary  [name] [id number] DOB: 12-24-1995 DOA: 11/06/2021  PCP: Danelle Berry, NP  Admit date: 11/06/2021 Discharge date: 11/09/2021  Discharge disposition: Home   Recommendations for Outpatient Follow-Up:   Follow-up with Dr. Christian Mate, general surgeon, on 11/24/2021   Discharge Diagnosis:   Principal Problem:   Choledocholithiasis Active Problems:   Crohn's colitis ([place])   Iron deficiency    Discharge Condition: Stable.  Diet recommendation:  Diet Order             Diet clear liquid Room service appropriate? Yes; Fluid consistency: Thin  Diet effective now           Diet - low sodium heart healthy                     Code Status: Full Code     Hospital Course:   [name] is a 26 year old woman with medical history significant for Crohn's disease, asthma, GERD, PCOS, who presented to the hospital because of acute onset of severe right upper abdominal pain, nausea and vomiting.  She was found to have choledocholithiasis and chronic calculus cholecystitis.  She underwent ERCP and biliary stone was completely removed with biliary sphincterotomy and balloon extraction.  She also underwent laparoscopic cholecystectomy.  Her condition has improved and she is deemed stable for discharge to home today.    Medical Consultants:   Gastroenterologist General surgeon   Discharge Exam:    Vitals:   11/08/21 1830 11/08/21 1924 11/09/21 0428 11/09/21 0802  BP: 126/77 121/80 130/79 129/82  Pulse: 69 66 (!) 59 (!) 58  Resp: 20 16 20    Temp: 98.6 F (37 C) 98.7 F (37.1 C) 98.2 F (36.8 C) 98.3 F (36.8 C)  TempSrc:    Oral  SpO2: 97% 100% 100% 100%  Weight:      Height:         GEN: NAD SKIN: No rash on exposed skin EYES: EOMI ENT: MMM CV: RRR PULM: CTA B ABD: soft, obese, mild surgical tenderness, no rebound tenderness or guarding, multiple tiny surgical incisions intact.  +BS CNS: AAO x 3, non  focal EXT: No edema or tenderness   The results of significant diagnostics from this hospitalization (including imaging, microbiology, ancillary and laboratory) are listed below for reference.     Procedures and Diagnostic Studies:   DG C-Arm 1-60 Min-No Report  Result Date: 11/07/2021 Fluoroscopy was utilized by the requesting physician.  No radiographic interpretation.   MR ABDOMEN MRCP W WO CONTAST  Result Date: 11/06/2021 CLINICAL DATA:  26 year old female with history of right upper quadrant pain with cholelithiasis and biliary ductal dilatation noted on recent ultrasound examination. Follow-up study to evaluate for potential choledocholithiasis. EXAM: MRI ABDOMEN WITHOUT AND WITH CONTRAST (INCLUDING MRCP) TECHNIQUE: Multiplanar multisequence MR imaging of the abdomen was performed both before and after the administration of intravenous contrast. Heavily T2-weighted images of the biliary and pancreatic ducts were obtained, and three-dimensional MRCP images were rendered by post processing. CONTRAST:  7mL GADAVIST GADOBUTROL 1 MMOL/ML IV SOLN COMPARISON:  No prior abdominal MRI. Abdominal ultrasound 11/06/2021. CT of the abdomen and pelvis 08/04/2021. FINDINGS: Lower chest: Unremarkable. Hepatobiliary: No suspicious cystic or solid hepatic lesions. Mild intra and extrahepatic biliary ductal dilatation noted on MRCP images. Common bile duct measures up to 8 mm in the porta hepatis. There is a tiny filling defect in the distal common bile duct (axial image 21 of series 4 and MRCP image  61 of series 13), compatible with choledocholithiasis, immediately before the level of the ampulla. Numerous tiny filling defects are also noted within the gallbladder, compatible with gallstones. Gallbladder is only moderately distended. Gallbladder wall thickness is normal. No significant volume of pericholecystic fluid or surrounding inflammatory changes. Pancreas: No pancreatic mass. No pancreatic ductal  dilatation. No pancreatic or peripancreatic fluid collections or inflammatory changes. Spleen:  Unremarkable. Adrenals/Urinary Tract: Bilateral kidneys and adrenal glands are normal in appearance. No hydroureteronephrosis in the visualized portions of the abdomen. Bilateral adrenal glands are normal in appearance. Stomach/Bowel: Unremarkable. Vascular/Lymphatic: No aneurysm identified in the visualized abdominal vasculature. No lymphadenopathy noted in the abdomen. Other: No significant volume of ascites noted in the visualized portions of the peritoneal cavity. Musculoskeletal: No aggressive appearing osseous lesions are noted in the visualized portions of the skeleton. IMPRESSION: 1. Study is positive for cholelithiasis and choledocholithiasis. No evidence of cholecystitis at this time. However, there is very mild intra and extrahepatic biliary ductal dilatation indicating obstruction at the level of the distal common bile duct from choledocholithiasis. Electronically Signed   By: Vinnie Langton M.D.   On: 11/06/2021 05:12   US ABDOMEN LIMITED RUQ (LIVER/GB)  Result Date: 11/06/2021 CLINICAL DATA:  Right upper quadrant abdominal pain EXAM: ULTRASOUND ABDOMEN LIMITED RIGHT UPPER QUADRANT COMPARISON:  None. FINDINGS: Gallbladder: Layering gallstones. No gallbladder wall thickening or pericholecystic fluid. Positive sonographic Murphy's sign. Common bile duct: Diameter: 8 mm Liver: No focal lesion identified. Within normal limits in parenchymal echogenicity. Suspected mild intrahepatic ductal dilatation. Portal vein is patent on color Doppler imaging with normal direction of blood flow towards the liver. Other: None. IMPRESSION: Cholelithiasis with positive sonographic Murphy's sign. However, there are no additional grayscale sonographic findings to suggest acute cholecystitis. Dilated common duct, measuring 8 mm. Suspected mild intrahepatic ductal dilatation. This appearance raises the possibility of  choledocholithiasis. Correlate with LFTs and consider MRCP or ERCP as clinically warranted. Electronically Signed   By: Julian Hy M.D.   On: 11/06/2021 01:49     Labs:   Basic Metabolic Panel: Recent Labs  Lab 11/05/21 2253 11/08/21 0520 11/09/21 0408  NA 133* 136 136  K 3.4* 3.6 4.1  CL 104 106 106  CO2 23 23 24   GLUCOSE 108* 85 102*  BUN 13 5* 7  CREATININE 0.68 0.53 0.59  CALCIUM 8.6* 8.4* 8.1*   GFR Estimated Creatinine Clearance: 92.9 mL/min (by C-G formula based on SCr of 0.59 mg/dL). Liver Function Tests: Recent Labs  Lab 11/05/21 2253 11/08/21 0520 11/09/21 0408  AST 451* 108* 80*  ALT 363* 239* 200*  ALKPHOS 160* 181* 169*  BILITOT 1.4* 0.7 0.7  PROT 8.0 6.6 7.0  ALBUMIN 3.5 3.0* 2.9*   Recent Labs  Lab 11/05/21 2253  LIPASE 49   No results for input(s): AMMONIA in the last 168 hours. Coagulation profile No results for input(s): INR, PROTIME in the last 168 hours.  CBC: Recent Labs  Lab 11/05/21 2253 11/07/21 0507 11/08/21 0520 11/09/21 0408  WBC 7.4 6.1 8.2 9.8  HGB 10.1* 8.9* 8.9* 9.2*  HCT 33.7* 30.3* 29.5* 31.0*  MCV 70.1* 70.6* 68.1* 69.0*  PLT 351 295 339 359   Cardiac Enzymes: No results for input(s): CKTOTAL, CKMB, CKMBINDEX, TROPONINI in the last 168 hours. BNP: Invalid input(s): POCBNP CBG: No results for input(s): GLUCAP in the last 168 hours. D-Dimer No results for input(s): DDIMER in the last 72 hours. Hgb A1c No results for input(s): HGBA1C in the last 72 hours. Lipid  Profile No results for input(s): CHOL, HDL, LDLCALC, TRIG, CHOLHDL, LDLDIRECT in the last 72 hours. Thyroid function studies No results for input(s): TSH, T4TOTAL, T3FREE, THYROIDAB in the last 72 hours.  Invalid input(s): FREET3 Anemia work up No results for input(s): VITAMINB12, FOLATE, FERRITIN, TIBC, IRON, RETICCTPCT in the last 72 hours. Microbiology Recent Results (from the past 240 hour(s))  Resp Panel by RT-PCR (Flu A&B, Covid)  Nasopharyngeal Swab     Status: None   Collection Time: 11/06/21  5:56 AM   Specimen: Nasopharyngeal Swab; Nasopharyngeal(NP) swabs in vial transport medium  Result Value Ref Range Status   SARS Coronavirus 2 by RT PCR NEGATIVE NEGATIVE Final    Comment: (NOTE) SARS-CoV-2 target nucleic acids are NOT DETECTED.  The SARS-CoV-2 RNA is generally detectable in upper respiratory specimens during the acute phase of infection. The lowest concentration of SARS-CoV-2 viral copies this assay can detect is 138 copies/mL. A negative result does not preclude SARS-Cov-2 infection and should not be used as the sole basis for treatment or other patient management decisions. A negative result may occur with  improper specimen collection/handling, submission of specimen other than nasopharyngeal swab, presence of viral mutation(s) within the areas targeted by this assay, and inadequate number of viral copies(<138 copies/mL). A negative result must be combined with clinical observations, patient history, and epidemiological information. The expected result is Negative.  Fact Sheet for Patients:  EntrepreneurPulse.com.au  Fact Sheet for Healthcare Providers:  IncredibleEmployment.be  This test is no t yet approved or cleared by the [place] FDA and  has been authorized for detection and/or diagnosis of SARS-CoV-2 by FDA under an Emergency Use Authorization (EUA). This EUA will remain  in effect (meaning this test can be used) for the duration of the COVID-19 declaration under Section 564(b)(1) of the Act, 21 U.S.C.section 360bbb-3(b)(1), unless the authorization is terminated  or revoked sooner.       Influenza A by PCR NEGATIVE NEGATIVE Final   Influenza B by PCR NEGATIVE NEGATIVE Final    Comment: (NOTE) The Xpert Xpress SARS-CoV-2/FLU/RSV plus assay is intended as an aid in the diagnosis of influenza from Nasopharyngeal swab specimens and should not be  used as a sole basis for treatment. Nasal washings and aspirates are unacceptable for Xpert Xpress SARS-CoV-2/FLU/RSV testing.  Fact Sheet for Patients: EntrepreneurPulse.com.au  Fact Sheet for Healthcare Providers: IncredibleEmployment.be  This test is not yet approved or cleared by the [place] FDA and has been authorized for detection and/or diagnosis of SARS-CoV-2 by FDA under an Emergency Use Authorization (EUA). This EUA will remain in effect (meaning this test can be used) for the duration of the COVID-19 declaration under Section 564(b)(1) of the Act, 21 U.S.C. section 360bbb-3(b)(1), unless the authorization is terminated or revoked.  Performed at [facility], [address], [place], [place] [address]   Surgical pcr screen     Status: Abnormal   Collection Time: 11/07/21  9:13 PM   Specimen: Nasal Mucosa; Nasal Swab  Result Value Ref Range Status   MRSA, PCR NEGATIVE NEGATIVE Final   Staphylococcus aureus POSITIVE (A) NEGATIVE Final    Comment: (NOTE) The Xpert SA Assay (FDA approved for NASAL specimens in patients 67 years of age and older), is one component of a comprehensive surveillance program. It is not intended to diagnose infection nor to guide or monitor treatment. Performed at [facility], [address], [place], [place] [address]      Discharge Instructions:   Discharge Instructions  Diet - low sodium heart healthy   Complete by: As directed    Discharge wound care:   Complete by: As directed    Follow up with Dr. Christian Mate in 2 weeks   Increase activity slowly   Complete by: As directed       Allergies as of 11/09/2021   No Known Allergies      Medication List     TAKE these medications    acetaminophen 325 MG tablet Commonly known as: TYLENOL Take 2 tablets (650 mg total) by mouth every 6 (six) hours as needed for mild pain (or Fever >/= 101).    HYDROcodone-acetaminophen 5-325 MG tablet Commonly known as: NORCO/VICODIN Take 1 tablet by mouth every 6 (six) hours as needed for severe pain.   ibuprofen 600 MG tablet Commonly known as: ADVIL Take 1 tablet (600 mg total) by mouth every 6 (six) hours as needed.   REMICADE IV Inject 10 mg PE/kg into the vein every 28 (twenty-eight) days.               Discharge Care Instructions  (From admission, onward)           Start     Ordered   11/09/21 0000  Discharge wound care:       Comments: Follow up with Dr. Christian Mate in 2 weeks   11/09/21 0959            Follow-up Information     Ronny Bacon, MD. Go on 11/24/2021.   Specialty: General Surgery Why: s/p laparoscopic cholecystectomy 10:15 Contact information: [address] [place] [address] [phone]                   If you experience worsening of your admission symptoms, develop shortness of breath, life threatening emergency, suicidal or homicidal thoughts you must seek medical attention immediately by calling 911 or calling your MD immediately  if symptoms less severe.   You must read complete instructions/literature along with all the possible adverse reactions/side effects for all the medicines you take and that have been prescribed to you. Take any new medicines after you have completely understood and accept all the possible adverse reactions/side effects.    Please note   You were cared for by a hospitalist during your hospital stay. If you have any questions about your discharge medications or the care you received while you were in the hospital after you are discharged, you can call the unit and asked to speak with the hospitalist on call if the hospitalist that took care of you is not available. Once you are discharged, your primary care physician will handle any further medical issues. Please note that NO REFILLS for any discharge medications will be authorized once  you are discharged, as it is imperative that you return to your primary care physician (or establish a relationship with a primary care physician if you do not have one) for your aftercare needs so that they can reassess your need for medications and monitor your lab values.       Time coordinating discharge: Greater than 30 minutes  Signed:  Ayomide Zuleta  [facility] 11/09/2021, 5:19 PM   Pager on [email or web address]. If 7PM-7AM, please contact night-coverage at [email or web address]

## 2021-11-09 NOTE — Discharge Summary (Incomplete)
Physician Discharge Summary   Patient: Sandra Wilkerson MRN: [id number] DOB: 1996-05-17  Admit date:     11/06/2021  Discharge date: {dischdate:26783}  Discharge Physician: Jennye Boroughs   PCP: Danelle Berry, NP   Recommendations at discharge:  {Tip this will not be part of the note when signed- Example include specific recommendations for outpatient follow-up, pending tests to follow-up on. (Optional):26781}  ***  Discharge Diagnoses: Principal Problem:   Choledocholithiasis Active Problems:   Crohn's colitis ([place])   Iron deficiency  Resolved Problems:   * No resolved hospital problems. [facility] Course: No notes on file  Assessment and Plan: No notes have been filed under this hospital service. Service: Hospitalist      {Tip this will not be part of the note when signed Body mass index is 33.73 kg/m. , ,  (Optional):26781}  {(NOTE) Pain control PDMP Statment (Optional):26782}  Consultants: *** Procedures performed: ***  Disposition: {Plan; Disposition:26390} Diet recommendation:  Discharge Diet Orders (From admission, onward)     Start     Ordered   11/09/21 0000  Diet - low sodium heart healthy        11/09/21 0959           {Diet_Plan:26776}  DISCHARGE MEDICATION: Allergies as of 11/09/2021   No Known Allergies      Medication List     TAKE these medications    acetaminophen 325 MG tablet Commonly known as: TYLENOL Take 2 tablets (650 mg total) by mouth every 6 (six) hours as needed for mild pain (or Fever >/= 101).   REMICADE IV Inject 10 mg PE/kg into the vein every 28 (twenty-eight) days.               Discharge Care Instructions  (From admission, onward)           Start     Ordered   11/09/21 0000  Discharge wound care:       Comments: Follow up with Dr. Christian Mate in 2 weeks   11/09/21 0959            Follow-up Information     Ronny Bacon, MD. Schedule an appointment as soon as possible for a  visit in 2 week(s).   Specialty: General Surgery Why: s/p laparoscopic cholecystectomy Contact information: [place] [address] [phone]                 Discharge Exam: Danley Danker Weights   11/05/21 2251 11/06/21 1716 11/08/21 1440  Weight: 77.1 kg 75.8 kg 75.8 kg   ***  Condition at discharge: {DC Condition:26389}  The results of significant diagnostics from this hospitalization (including imaging, microbiology, ancillary and laboratory) are listed below for reference.   Imaging Studies: DG C-Arm 1-60 Min-No Report  Result Date: 11/07/2021 Fluoroscopy was utilized by the requesting physician.  No radiographic interpretation.   MR ABDOMEN MRCP W WO CONTAST  Result Date: 11/06/2021 CLINICAL DATA:  26 year old female with history of right upper quadrant pain with cholelithiasis and biliary ductal dilatation noted on recent ultrasound examination. Follow-up study to evaluate for potential choledocholithiasis. EXAM: MRI ABDOMEN WITHOUT AND WITH CONTRAST (INCLUDING MRCP) TECHNIQUE: Multiplanar multisequence MR imaging of the abdomen was performed both before and after the administration of intravenous contrast. Heavily T2-weighted images of the biliary and pancreatic ducts were obtained, and three-dimensional MRCP images were rendered by post processing. CONTRAST:  59mL GADAVIST GADOBUTROL 1 MMOL/ML IV SOLN COMPARISON:  No prior abdominal MRI.  Abdominal ultrasound 11/06/2021. CT of the abdomen and pelvis 08/04/2021. FINDINGS: Lower chest: Unremarkable. Hepatobiliary: No suspicious cystic or solid hepatic lesions. Mild intra and extrahepatic biliary ductal dilatation noted on MRCP images. Common bile duct measures up to 8 mm in the porta hepatis. There is a tiny filling defect in the distal common bile duct (axial image 21 of series 4 and MRCP image 61 of series 13), compatible with choledocholithiasis, immediately before the level of the ampulla. Numerous  tiny filling defects are also noted within the gallbladder, compatible with gallstones. Gallbladder is only moderately distended. Gallbladder wall thickness is normal. No significant volume of pericholecystic fluid or surrounding inflammatory changes. Pancreas: No pancreatic mass. No pancreatic ductal dilatation. No pancreatic or peripancreatic fluid collections or inflammatory changes. Spleen:  Unremarkable. Adrenals/Urinary Tract: Bilateral kidneys and adrenal glands are normal in appearance. No hydroureteronephrosis in the visualized portions of the abdomen. Bilateral adrenal glands are normal in appearance. Stomach/Bowel: Unremarkable. Vascular/Lymphatic: No aneurysm identified in the visualized abdominal vasculature. No lymphadenopathy noted in the abdomen. Other: No significant volume of ascites noted in the visualized portions of the peritoneal cavity. Musculoskeletal: No aggressive appearing osseous lesions are noted in the visualized portions of the skeleton. IMPRESSION: 1. Study is positive for cholelithiasis and choledocholithiasis. No evidence of cholecystitis at this time. However, there is very mild intra and extrahepatic biliary ductal dilatation indicating obstruction at the level of the distal common bile duct from choledocholithiasis. Electronically Signed   By: Vinnie Langton M.D.   On: 11/06/2021 05:12   US ABDOMEN LIMITED RUQ (LIVER/GB)  Result Date: 11/06/2021 CLINICAL DATA:  Right upper quadrant abdominal pain EXAM: ULTRASOUND ABDOMEN LIMITED RIGHT UPPER QUADRANT COMPARISON:  None. FINDINGS: Gallbladder: Layering gallstones. No gallbladder wall thickening or pericholecystic fluid. Positive sonographic Murphy's sign. Common bile duct: Diameter: 8 mm Liver: No focal lesion identified. Within normal limits in parenchymal echogenicity. Suspected mild intrahepatic ductal dilatation. Portal vein is patent on color Doppler imaging with normal direction of blood flow towards the liver. Other:  None. IMPRESSION: Cholelithiasis with positive sonographic Murphy's sign. However, there are no additional grayscale sonographic findings to suggest acute cholecystitis. Dilated common duct, measuring 8 mm. Suspected mild intrahepatic ductal dilatation. This appearance raises the possibility of choledocholithiasis. Correlate with LFTs and consider MRCP or ERCP as clinically warranted. Electronically Signed   By: Julian Hy M.D.   On: 11/06/2021 01:49    Microbiology: Results for orders placed or performed during the hospital encounter of 11/06/21  Resp Panel by RT-PCR (Flu A&B, Covid) Nasopharyngeal Swab     Status: None   Collection Time: 11/06/21  5:56 AM   Specimen: Nasopharyngeal Swab; Nasopharyngeal(NP) swabs in vial transport medium  Result Value Ref Range Status   SARS Coronavirus 2 by RT PCR NEGATIVE NEGATIVE Final    Comment: (NOTE) SARS-CoV-2 target nucleic acids are NOT DETECTED.  The SARS-CoV-2 RNA is generally detectable in upper respiratory specimens during the acute phase of infection. The lowest concentration of SARS-CoV-2 viral copies this assay can detect is 138 copies/mL. A negative result does not preclude SARS-Cov-2 infection and should not be used as the sole basis for treatment or other patient management decisions. A negative result may occur with  improper specimen collection/handling, submission of specimen other than nasopharyngeal swab, presence of viral mutation(s) within the areas targeted by this assay, and inadequate number of viral copies(<138 copies/mL). A negative result must be combined with clinical observations, patient history, and epidemiological information. The expected result is Negative.  Fact Sheet for Patients:  EntrepreneurPulse.com.au  Fact Sheet for Healthcare Providers:  IncredibleEmployment.be  This test is no t yet approved or cleared by the [place] FDA and  has been authorized for  detection and/or diagnosis of SARS-CoV-2 by FDA under an Emergency Use Authorization (EUA). This EUA will remain  in effect (meaning this test can be used) for the duration of the COVID-19 declaration under Section 564(b)(1) of the Act, 21 U.S.C.section 360bbb-3(b)(1), unless the authorization is terminated  or revoked sooner.       Influenza A by PCR NEGATIVE NEGATIVE Final   Influenza B by PCR NEGATIVE NEGATIVE Final    Comment: (NOTE) The Xpert Xpress SARS-CoV-2/FLU/RSV plus assay is intended as an aid in the diagnosis of influenza from Nasopharyngeal swab specimens and should not be used as a sole basis for treatment. Nasal washings and aspirates are unacceptable for Xpert Xpress SARS-CoV-2/FLU/RSV testing.  Fact Sheet for Patients: EntrepreneurPulse.com.au  Fact Sheet for Healthcare Providers: IncredibleEmployment.be  This test is not yet approved or cleared by the [place] FDA and has been authorized for detection and/or diagnosis of SARS-CoV-2 by FDA under an Emergency Use Authorization (EUA). This EUA will remain in effect (meaning this test can be used) for the duration of the COVID-19 declaration under Section 564(b)(1) of the Act, 21 U.S.C. section 360bbb-3(b)(1), unless the authorization is terminated or revoked.  Performed at [facility], [address] [place], [place] [address]   Surgical pcr screen     Status: Abnormal   Collection Time: 11/07/21  9:13 PM   Specimen: Nasal Mucosa; Nasal Swab  Result Value Ref Range Status   MRSA, PCR NEGATIVE NEGATIVE Final   Staphylococcus aureus POSITIVE (A) NEGATIVE Final    Comment: (NOTE) The Xpert SA Assay (FDA approved for NASAL specimens in patients 65 years of age and older), is one component of a comprehensive surveillance program. It is not intended to diagnose infection nor to guide or monitor treatment. Performed at [facility], [address] [place], [place] [address]     Labs: CBC: Recent Labs  Lab 11/05/21 2253 11/07/21 0507 11/08/21 0520 11/09/21 0408  WBC 7.4 6.1 8.2 9.8  HGB 10.1* 8.9* 8.9* 9.2*  HCT 33.7* 30.3* 29.5* 31.0*  MCV 70.1* 70.6* 68.1* 69.0*  PLT 351 295 339 [id number]   Basic Metabolic Panel: Recent Labs  Lab 11/05/21 2253 11/08/21 0520 11/09/21 0408  NA 133* 136 136  K 3.4* 3.6 4.1  CL 104 106 106  CO2 23 23 24   GLUCOSE 108* 85 102*  BUN 13 5* 7  CREATININE 0.68 0.53 0.59  CALCIUM 8.6* 8.4* 8.1*   Liver Function Tests: Recent Labs  Lab 11/05/21 2253 11/08/21 0520 11/09/21 0408  AST 451* 108* 80*  ALT 363* 239* 200*  ALKPHOS 160* 181* 169*  BILITOT 1.4* 0.7 0.7  PROT 8.0 6.6 7.0  ALBUMIN 3.5 3.0* 2.9*   CBG: No results for input(s): GLUCAP in the last 168 hours.  Discharge time spent: {LESS THAN/GREATER DI:[id number] 30 minutes.  Signed: Jennye Boroughs, MD [facility] 11/09/2021

## 2021-11-09 NOTE — TOC CM/SW Note (Signed)
Patient has orders to discharge home today. Chart reviewed. PCP is Orson Eva, NP. On room air. Has incision on abdomen (liquid skin adhesive). No TOC needs identified. CSW signing off.  Charlynn Court, CSW [phone]

## 2021-11-09 NOTE — Progress Notes (Signed)
Discharge papers reviewed with the patient. Ride is here. Patient being sent out via wheelchair

## 2021-11-09 NOTE — Discharge Instructions (Signed)

## 2021-11-09 NOTE — Progress Notes (Signed)
[place] [facility] Day(s): 3.   Post op day(s): 1 Day Post-Op.   Interval History:  Patient seen and examined No acute events or new complaints overnight.  Patient reports she is feeling good this morning, some abdominal soreness No fever, chills, nausea, emesis She continues to be without leukocytosis; WBC 9.8K Renal function remains normal; sCr - 0.59; UO - unmeasured No significant electrolyte derangements LFTs continue to improve some  Vital signs in last 24 hours: [min-max] current  Temp:  [97.9 F (36.6 C)-98.7 F (37.1 C)] 98.2 F (36.8 C) (02/01 0428) Pulse Rate:  [59-82] 59 (02/01 0428) Resp:  [16-24] 20 (02/01 0428) BP: (121-130)/(73-88) 130/79 (02/01 0428) SpO2:  [97 %-100 %] 100 % (02/01 0428) Weight:  [75.8 kg] 75.8 kg (01/31 1440)     Height: 4\' 11"  (149.9 cm) Weight: 75.8 kg BMI (Calculated): 33.71   Intake/Output last 2 shifts:  01/31 0701 - 02/01 0700 In: 1127.1 [I.V.:927.1; IV Piggyback:200] Out: 5 [Blood:5]   Physical Exam:  Constitutional: alert, cooperative and no distress  Respiratory: breathing non-labored at rest  Cardiovascular: regular rate and sinus rhythm  Gastrointestinal: Soft, mild incisional soreness, and non-distended, no rebound/guarding Integumentary: Laparoscopic incisions are CDI with dermabond, no erythema or drainage   Labs:  CBC Latest Ref Rng & Units 11/09/2021 11/08/2021 11/07/2021  WBC 4.0 - 10.5 K/uL 9.8 8.2 6.1  Hemoglobin 12.0 - 15.0 g/dL 9.2(L) 8.9(L) 8.9(L)  Hematocrit 36.0 - 46.0 % 31.0(L) 29.5(L) 30.3(L)  Platelets 150 - 400 K/uL 359 339 295   CMP Latest Ref Rng & Units 11/09/2021 11/08/2021 11/05/2021  Glucose 70 - 99 mg/dL 102(H) 85 108(H)  BUN 6 - 20 mg/dL 7 5(L) 13  Creatinine 0.44 - 1.00 mg/dL 0.59 0.53 0.68  Sodium 135 - 145 mmol/L 136 136 133(L)  Potassium 3.5 - 5.1 mmol/L 4.1 3.6 3.4(L)  Chloride 98 - 111 mmol/L 106 106 104  CO2 22 - 32 mmol/L 24 23 23   Calcium 8.9 -  10.3 mg/dL 8.1(L) 8.4(L) 8.6(L)  Total Protein 6.5 - 8.1 g/dL 7.0 6.6 8.0  Total Bilirubin 0.3 - 1.2 mg/dL 0.7 0.7 1.4(H)  Alkaline Phos 38 - 126 U/L 169(H) 181(H) 160(H)  AST 15 - 41 U/L 80(H) 108(H) 451(H)  ALT 0 - 44 U/L 200(H) 239(H) 363(H)    Imaging studies: No new pertinent imaging studies   Assessment/Plan:  26 y.o. female 1 Day Post-Op s/p robotic assisted laparoscopic cholecystectomy for choledocholithiasis s/p ERCP (01/30).    - Okay to advance diet as tolerated   - Discontinue IVF   - Monitor abdominal examination; on-going bowel function   - Pain control prn; antiemetics prn   - Mobilization as tolerated  - Further management per primary service   - Discharge Planning: Stable for discharge from surgical perspective, pain control for home. She can follow up in ~2 weeks. I will update follow up and DC instructions   All of the above findings and recommendations were discussed with the patient, and the medical team, and all of patient's questions were answered to her expressed satisfaction.  -- Edison Simon, PA-C [facility] 11/09/2021, 7:35 AM [phone] M-F: 7am - 4pm

## 2021-11-10 ENCOUNTER — Other Ambulatory Visit: Payer: Self-pay | Admitting: Surgery

## 2021-11-10 ENCOUNTER — Telehealth: Payer: Self-pay | Admitting: *Deleted

## 2021-11-10 LAB — SURGICAL PATHOLOGY

## 2021-11-10 MED ORDER — HYDROCODONE-ACETAMINOPHEN 5-325 MG PO TABS: 2.0000 | ORAL_TABLET | Freq: Four times a day (QID) | ORAL | 0 refills | Status: DC | PRN

## 2021-11-10 NOTE — Telephone Encounter (Signed)
Tried reaching patient at this time- left message to return call- I will let her know the message for medication refill request has been routed to Dr.Rodenberg and will need to await on his response.

## 2021-11-10 NOTE — Progress Notes (Unsigned)
Rerouting to preferred pharmacy.  [facility] in [place].

## 2021-11-10 NOTE — Telephone Encounter (Signed)
Incoming call from the pt's mom, Sheletha, insisting to spk w/Emily that just left her a msg.  After attempting to educate Sheletha that Irving Burton is @ lunch & has been @ lunch for 15+ minutes, I offered to place her on hold & see if there was another clinician that could assist. Upon arrival back to the phone, the call was gone.  [place], CMA will be making an outreach to her.  Thank you

## 2021-11-10 NOTE — Telephone Encounter (Signed)
Patients mother Mike Gip) called and went to get her daughters prescription hydrocodone at [facility] in [facility] but they do not have it. Can we send it to [facility] in [place]?  Patient had surgery on 1/33/23 Dr Nance Pear gallbladder removal

## 2021-11-10 NOTE — Telephone Encounter (Signed)
Spoke with patient's mom to let her know that prescription for Narcotic has been called to [facility].

## 2021-11-24 ENCOUNTER — Encounter: Payer: Self-pay | Admitting: Surgery

## 2021-11-24 ENCOUNTER — Other Ambulatory Visit: Payer: Self-pay

## 2021-11-24 ENCOUNTER — Ambulatory Visit (INDEPENDENT_AMBULATORY_CARE_PROVIDER_SITE_OTHER): Payer: Medicaid Other | Admitting: Surgery

## 2021-11-24 VITALS — BP 127/83 | HR 75 | Temp 99.0°F | Ht 59.0 in | Wt 163.0 lb

## 2021-11-24 DIAGNOSIS — Z9049 Acquired absence of other specified parts of digestive tract: Secondary | ICD-10-CM

## 2021-11-24 DIAGNOSIS — K8064 Calculus of gallbladder and bile duct with chronic cholecystitis without obstruction: Secondary | ICD-10-CM

## 2021-11-24 DIAGNOSIS — Z09 Encounter for follow-up examination after completed treatment for conditions other than malignant neoplasm: Secondary | ICD-10-CM

## 2021-11-24 NOTE — Progress Notes (Signed)
[facility] POST-OP OFFICE VISIT  11/24/2021  HPI: Sandra Wilkerson is a 26 y.o. female 17 days s/p robotic cholecystectomy.  Postoperatively she complains mostly of the left-sided trocar site incision.  She denies any fevers or chills.  She does continue to have loose bowel movements which is consistent with her Crohn's.  She reports she has been off Remicade for at least 2 doses due to insurance changes and changes in medications and follow-up.  Vital signs: BP 127/83    Pulse 75    Temp 99 F (37.2 C) (Oral)    Ht 4\' 11"  (1.499 m)    Wt 163 lb (73.9 kg)    SpO2 99%    BMI 32.92 kg/m    Physical Exam: Constitutional: She appears well. Abdomen: Wide midline scar once again noted.  4 incisions all appear intact without mass, hematoma or ecchymosis present.  Localized tenderness immediately adjacent/between the 2 left-sided trocar sites.  Otherwise nontender and soft. Skin: Incisions appear to be healing well, they are clean, dry and intact.  Assessment/Plan: This is a 26 y.o. female 17 days s/p robotic cholecystectomy.  Progressing well.  Patient Active Problem List   Diagnosis Date Noted   Choledocholithiasis 11/06/2021   Post-dates pregnancy 07/15/2021   Uterine contractions during pregnancy    Pregnancy 07/12/2021   Group B streptococcal carriage complicating pregnancy 0000000   Indication for care in labor or delivery 05/16/2021   Abdominal pregnancy with intrauterine pregnancy 01/06/2021   Subchorionic hematoma in first trimester 12/29/2020   History of infertility 12/29/2020   Obesity (BMI 30-39.9) 12/29/2020   PCO (polycystic ovaries) 12/29/2020   Iron deficiency 03/13/2018   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   Crohn's colitis ([place]) 09/12/2017   Other headache syndrome 03/26/2017   Asthma 09/30/2014   Keloid scar 09/19/2011   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  Encouraged her that her abdominal wall pain will likely improve with time as she increases her activity.  We will see her back in 1 month or as needed.   Ronny Bacon M.D., FACS 11/24/2021, 10:41 AM

## 2021-11-24 NOTE — Patient Instructions (Addendum)
° ° °  GENERAL POST-OPERATIVE PATIENT INSTRUCTIONS   WOUND CARE INSTRUCTIONS:  Keep a dry clean dressing on the wound if there is drainage. The initial bandage may be removed after 24 hours.  Once the wound has quit draining you may leave it open to air.  If clothing rubs against the wound or causes irritation and the wound is not draining you may cover it with a dry dressing during the daytime.  Try to keep the wound dry and avoid ointments on the wound unless directed to do so.  If the wound becomes bright red and painful or starts to drain infected material that is not clear, please contact your physician immediately.  If the wound is mildly pink and has a thick firm ridge underneath it, this is normal, and is referred to as a healing ridge.  This will resolve over the next 4-6 weeks.  BATHING: You may shower if you have been informed of this by your surgeon. However, Please do not submerge in a tub, hot tub, or pool until incisions are completely sealed or have been told by your surgeon that you may do so.  DIET:  You may eat any foods that you can tolerate.  It is a good idea to eat a high fiber diet and take in plenty of fluids to prevent constipation.  If you do become constipated you may want to take a mild laxative or take ducolax tablets on a daily basis until your bowel habits are regular.  Constipation can be very uncomfortable, along with straining, after recent surgery.  ACTIVITY:  You are encouraged to cough and deep breath or use your incentive spirometer if you were given one, every 15-30 minutes when awake.  This will help prevent respiratory complications and low grade fevers post-operatively if you had a general anesthetic.  You may want to hug a pillow when coughing and sneezing to add additional support to the surgical area, if you had abdominal or chest surgery, which will decrease pain during these times.  You are encouraged to walk and engage in light activity for the next two  weeks.  You should not lift more than 20 pounds, until 12/20/2016 as it could put you at increased risk for complications.  Twenty pounds is roughly equivalent to a plastic bag of groceries. At that time- Listen to your body when lifting, if you have pain when lifting, stop and then try again in a few days. Soreness after doing exercises or activities of daily living is normal as you get back in to your normal routine.  MEDICATIONS:  Try to take narcotic medications and anti-inflammatory medications, such as tylenol, ibuprofen, naprosyn, etc., with food.  This will minimize stomach upset from the medication.  Should you develop nausea and vomiting from the pain medication, or develop a rash, please discontinue the medication and contact your physician.  You should not drive, make important decisions, or operate machinery when taking narcotic pain medication.  SUNBLOCK Use sun block to incision area over the next year if this area will be exposed to sun. This helps decrease scarring and will allow you avoid a permanent darkened area over your incision.  QUESTIONS:  Please feel free to call our office if you have any questions, and we will be glad to assist you. [phone]

## 2021-12-20 DIAGNOSIS — K648 Other hemorrhoids: Secondary | ICD-10-CM | POA: Diagnosis not present

## 2021-12-20 DIAGNOSIS — K5090 Crohn's disease, unspecified, without complications: Secondary | ICD-10-CM | POA: Diagnosis not present

## 2021-12-20 DIAGNOSIS — K5010 Crohn's disease of large intestine without complications: Secondary | ICD-10-CM | POA: Diagnosis not present

## 2021-12-22 ENCOUNTER — Encounter: Payer: Self-pay | Admitting: Surgery

## 2021-12-22 ENCOUNTER — Ambulatory Visit (INDEPENDENT_AMBULATORY_CARE_PROVIDER_SITE_OTHER): Payer: BC Managed Care – PPO | Admitting: Surgery

## 2021-12-22 ENCOUNTER — Other Ambulatory Visit: Payer: Self-pay

## 2021-12-22 VITALS — BP 111/78 | HR 76 | Temp 98.5°F | Ht 59.0 in | Wt 164.8 lb

## 2021-12-22 DIAGNOSIS — Z09 Encounter for follow-up examination after completed treatment for conditions other than malignant neoplasm: Secondary | ICD-10-CM

## 2021-12-22 DIAGNOSIS — K8064 Calculus of gallbladder and bile duct with chronic cholecystitis without obstruction: Secondary | ICD-10-CM

## 2021-12-22 NOTE — Patient Instructions (Signed)
GENERAL POST-OPERATIVE ?PATIENT INSTRUCTIONS  ? ?WOUND CARE INSTRUCTIONS:  Keep a dry clean dressing on the wound if there is drainage. The initial bandage may be removed after 24 hours.  Once the wound has quit draining you may leave it open to air.  If clothing rubs against the wound or causes irritation and the wound is not draining you may cover it with a dry dressing during the daytime.  Try to keep the wound dry and avoid ointments on the wound unless directed to do so.  If the wound becomes bright red and painful or starts to drain infected material that is not clear, please contact your physician immediately.  If the wound is mildly pink and has a thick firm ridge underneath it, this is normal, and is referred to as a healing ridge.  This will resolve over the next 4-6 weeks. ? ?BATHING: ?You may shower if you have been informed of this by your surgeon. However, Please do not submerge in a tub, hot tub, or pool until incisions are completely sealed or have been told by your surgeon that you may do so. ? ?DIET:  You may eat any foods that you can tolerate.  It is a good idea to eat a high fiber diet and take in plenty of fluids to prevent constipation.  If you do become constipated you may want to take a mild laxative or take ducolax tablets on a daily basis until your bowel habits are regular.  Constipation can be very uncomfortable, along with straining, after recent surgery. ? ?ACTIVITY:  You are encouraged to cough and deep breath or use your incentive spirometer if you were given one, every 15-30 minutes when awake.  This will help prevent respiratory complications and low grade fevers post-operatively if you had a general anesthetic.  You may want to hug a pillow when coughing and sneezing to add additional support to the surgical area, if you had abdominal or chest surgery, which will decrease pain during these times.  You are encouraged to walk and engage in light activity for the next two weeks.  You  should not lift more than 20 pounds, until 12/27/2021 as it could put you at increased risk for complications.  Twenty pounds is roughly equivalent to a plastic bag of groceries. At that time- Listen to your body when lifting, if you have pain when lifting, stop and then try again in a few days. Soreness after doing exercises or activities of daily living is normal as you get back in to your normal routine. ? ?MEDICATIONS:  Try to take narcotic medications and anti-inflammatory medications, such as tylenol, ibuprofen, naprosyn, etc., with food.  This will minimize stomach upset from the medication.  Should you develop nausea and vomiting from the pain medication, or develop a rash, please discontinue the medication and contact your physician.  You should not drive, make important decisions, or operate machinery when taking narcotic pain medication. ? ?SUNBLOCK ?Use sun block to incision area over the next year if this area will be exposed to sun. This helps decrease scarring and will allow you avoid a permanent darkened area over your incision. ? ?QUESTIONS:  Please feel free to call our office if you have any questions, and we will be glad to assist you. [phone] ? ? ?

## 2021-12-22 NOTE — Progress Notes (Signed)
[facility] ?POST-OP OFFICE VISIT ? ?12/22/2021 ? ?HPI: ?Sandra Wilkerson is a 26 y.o. female 6 weeks s/p robotic cholecystectomy.  She is doing very well.  No complaints, no issues of pain, no incisional issues.  Denies intestinal issues, nausea vomiting, no fevers or chills. ? ?Vital signs: ?BP 111/78   Pulse 76   Temp 98.5 ?F (36.9 ?C) (Oral)   Ht 4\' 11"  (1.499 m)   Wt 164 lb 12.8 oz (74.8 kg)   SpO2 97%   Breastfeeding Unknown   BMI 33.29 kg/m?   ? ?Physical Exam: ?Constitutional: She appears well. ?Abdomen: Soft nontender nondistended. ?Skin: All incisions are clean, dry and intact. ? ?Assessment/Plan: ?This is a 26 y.o. female 6 weeks s/p robotic cholecystectomy.  Doing well. ? ?Patient Active Problem List  ? Diagnosis Date Noted  ? Choledocholithiasis 11/06/2021  ? Post-dates pregnancy 07/15/2021  ? Uterine contractions during pregnancy   ? Pregnancy 07/12/2021  ? Group B streptococcal carriage complicating pregnancy 06/29/2021  ? Indication for care in labor or delivery 05/16/2021  ? Abdominal pregnancy with intrauterine pregnancy 01/06/2021  ? Subchorionic hematoma in first trimester 12/29/2020  ? History of infertility 12/29/2020  ? Obesity (BMI 30-39.9) 12/29/2020  ? PCO (polycystic ovaries) 12/29/2020  ? Iron deficiency 03/13/2018  ? 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  ? Neck pain 02/26/2018  ? Symptomatic mammary hypertrophy 02/26/2018  ? Crohn's colitis (HCC) 09/12/2017  ? Other headache syndrome 03/26/2017  ? Chest pain 03/26/2017  ? Esophageal reflux 03/26/2017  ? Asthma 09/30/2014  ? Anemia 09/30/2014  ? Keloid scar 09/19/2011  ? 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  ? Peritoneal abscess (HCC) 03/13/2010  ? Crohn's disease without complication (HCC) 03/08/2004  ? ? -For released to all activities.  No routine scheduled follow-up planned. ? ? ?03/10/2004 M.D., FACS ?12/22/2021, 10:31 AM ? ?

## 2022-01-02 DIAGNOSIS — K50919 Crohn's disease, unspecified, with unspecified complications: Secondary | ICD-10-CM | POA: Diagnosis not present

## 2022-03-06 IMAGING — MR MR ABDOMEN WO/W CM MRCP
20 of 21 series · 44 of 48 positions shown · IV contrast (7ml Gadavist)
Comparison: No prior abdominal MRI. Abdominal ultrasound
11/06/2021. CT of the abdomen and pelvis 08/04/2021.

CLINICAL DATA: 26-year-old female with history of right upper
quadrant pain with cholelithiasis and biliary ductal dilatation
noted on recent ultrasound examination. Follow-up study to evaluate
for potential choledocholithiasis.

EXAM:
MRI ABDOMEN WITHOUT AND WITH CONTRAST (INCLUDING MRCP)
TECHNIQUE: Multiplanar multisequence MR imaging of the abdomen was performed
both before and after the administration of intravenous contrast.
Heavily T2-weighted images of the biliary and pancreatic ducts were
obtained, and three-dimensional MRCP images were rendered by post
processing.
CONTRAST:  7mL GADAVIST GADOBUTROL 1 MMOL/ML IV SOLN

[Series 3: T2 · coronal · 6.0mm · 1.12mm/px · 1 of 28 slices shown (1 of 2)]
[im 1/28]
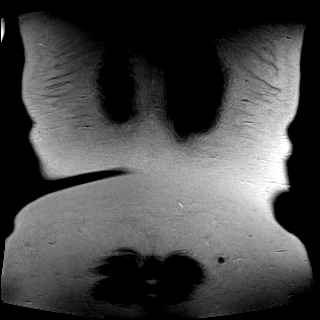

[Series 4: T2 · axial · 6.0mm · 1.12mm/px · 1 of 32 slices shown (2 of 2)]
[im 1/32]
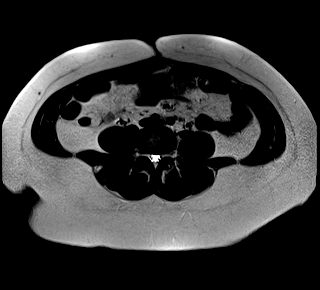

[Series 5: T1 · axial · 3.0mm · 1.19mm/px · z∈[-13,+200]mm · 3 of 72 slices shown (1 of 2)]
[im 1/72]
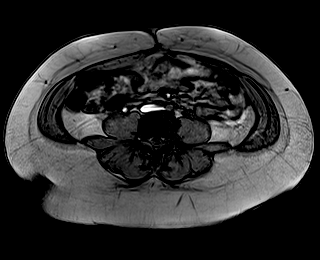
[im 36/72]
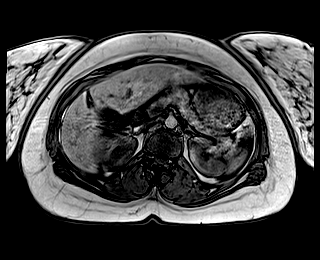
[im 72/72]
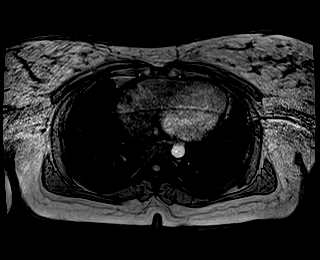

[Series 6: T1 · axial · 3.0mm · 1.19mm/px · z∈[-13,+200]mm · 3 of 72 slices shown (2 of 2)]
[im 1/72]
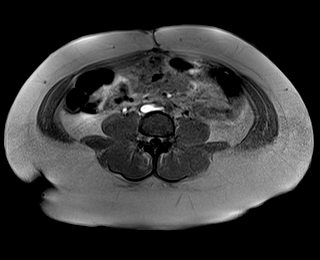
[im 36/72]
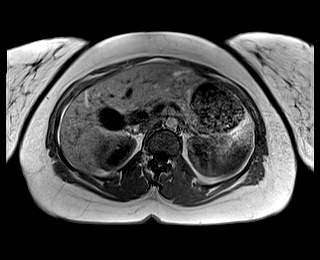
[im 72/72]
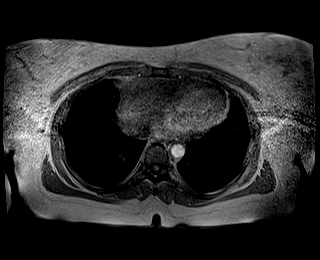

[Series 9: T2 fat-sat · axial · 6.0mm · 1.19mm/px · 1 of 34 slices shown]
[im 1/34]
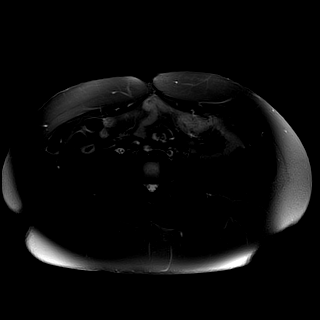

[Series 10: ax dwi_tracew · axial · 6.0mm · 1.42mm/px · 1 of 34 slices shown]
[im 1/34]
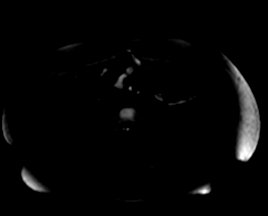

[Series 11: ax dwi_adc · axial · 6.0mm · 1.42mm/px · 1 of 34 slices shown]
[im 1/34]
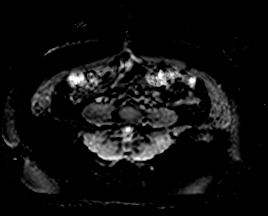

[Series 15: MRCP · coronal · 3.0mm · 1.06mm/px · 1 of 20 slices shown]
[im 1/20]
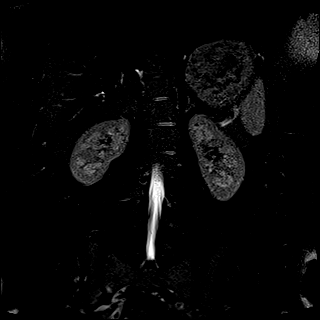

[Series 16: radials · coronal · 50.0mm · 0.78mm/px · 1 of 5 slices shown]
[im 1/5]
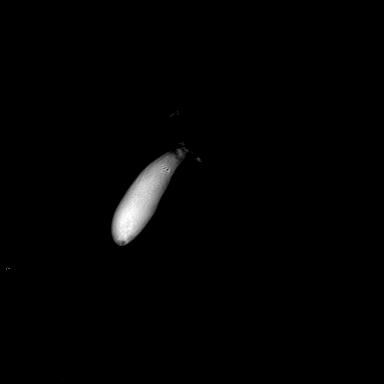

[Series 17: T1 dynamic fat-sat · axial · non-contrast · 3.0mm · 1.06mm/px · z∈[-16,+221]mm · 3 of 80 slices shown (1 of 5)]
[im 1/80]
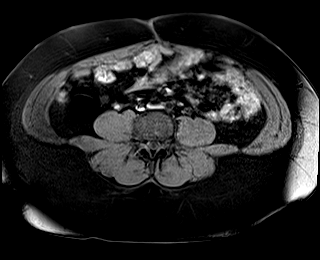
[im 40/80]
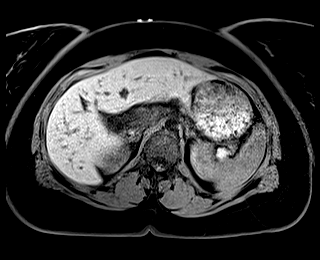
[im 80/80]
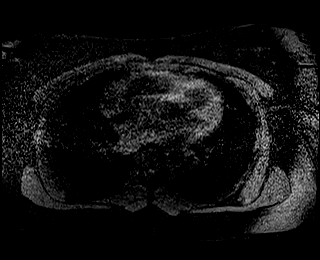

[Series 18: T1 dynamic fat-sat post-contrast · axial · 3.0mm · 1.06mm/px · z∈[-16,+221]mm · 3 of 80 slices shown (1 of 4)]
[im 1/80]
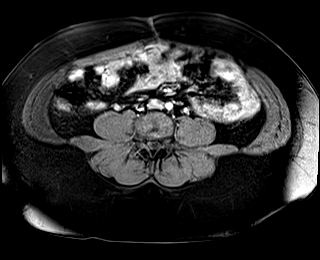
[im 40/80]
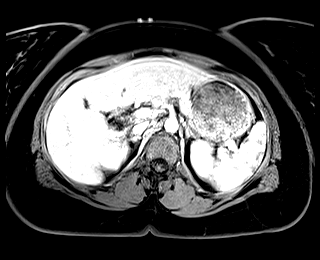
[im 80/80]
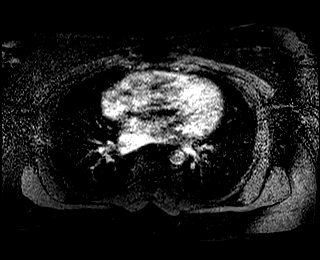

[Series 19: T1 dynamic fat-sat · axial · 3.0mm · 1.06mm/px · z∈[-16,+221]mm · 3 of 80 slices shown (2 of 5)]
[im 1/80]
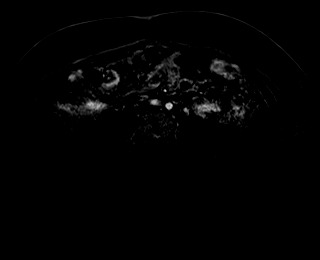
[im 40/80]
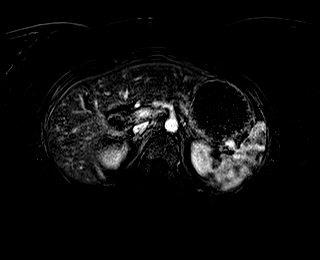
[im 80/80]
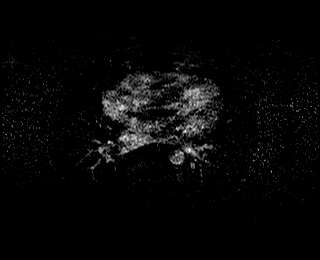

[Series 20: T1 dynamic fat-sat post-contrast · axial · 3.0mm · 1.06mm/px · z∈[-16,+221]mm · 3 of 80 slices shown (2 of 4)]
[im 1/80]
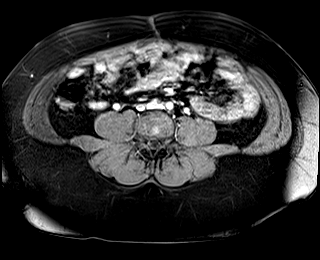
[im 40/80]
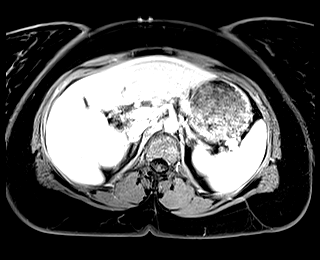
[im 80/80]
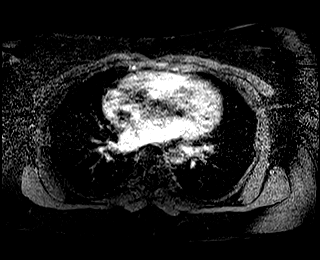

[Series 21: T1 dynamic fat-sat · axial · 3.0mm · 1.06mm/px · z∈[-16,+221]mm · 3 of 80 slices shown (3 of 5)]
[im 1/80]
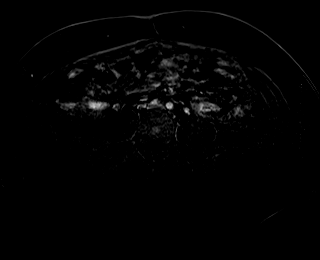
[im 40/80]
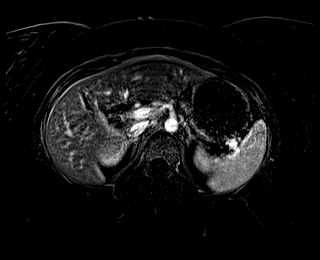
[im 80/80]
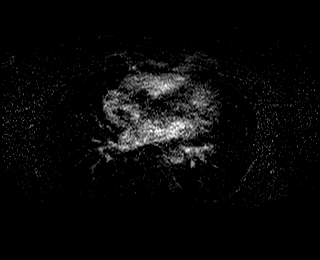

[Series 22: T1 dynamic fat-sat post-contrast · axial · 3.0mm · 1.06mm/px · z∈[-16,+221]mm · 3 of 80 slices shown (3 of 4)]
[im 1/80]
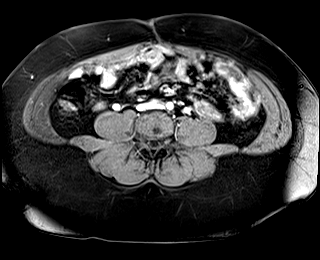
[im 40/80]
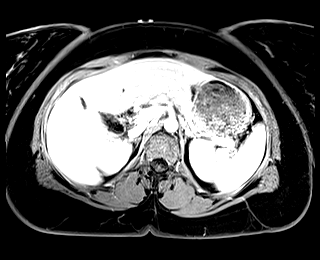
[im 80/80]
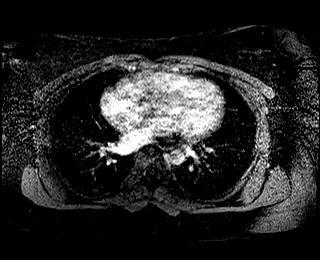

[Series 23: T1 dynamic fat-sat · axial · 3.0mm · 1.06mm/px · z∈[-16,+221]mm · 3 of 80 slices shown (4 of 5)]
[im 1/80]
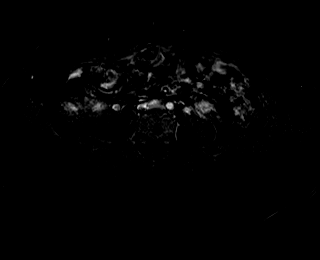
[im 40/80]
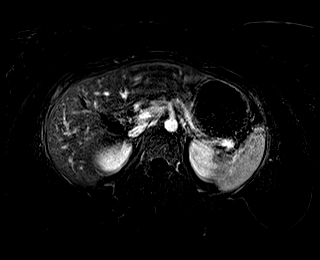
[im 80/80]
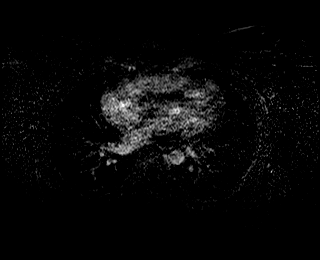

[Series 24: T1 dynamic post-contrast · coronal · 3.0mm · 1.12mm/px · 2 of 64 slices shown (1 of 2)]
[im 1/64]
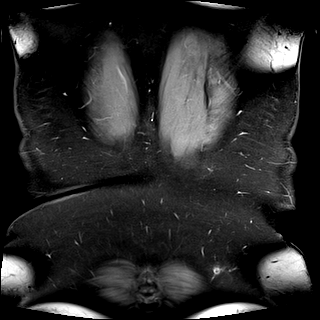
[im 64/64]
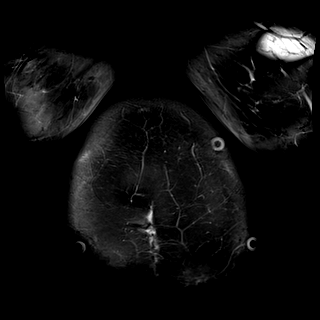

[Series 25: T1 dynamic fat-sat post-contrast · axial · 3.0mm · 1.06mm/px · z∈[-16,+221]mm · 3 of 80 slices shown (4 of 4)]
[im 1/80]
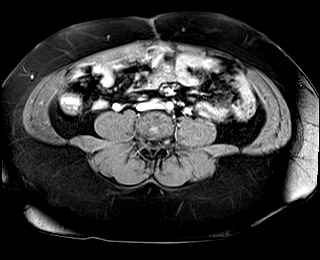
[im 40/80]
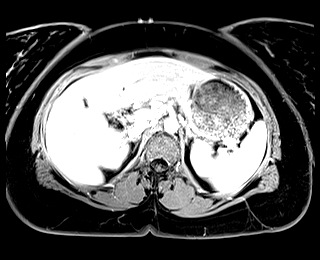
[im 80/80]
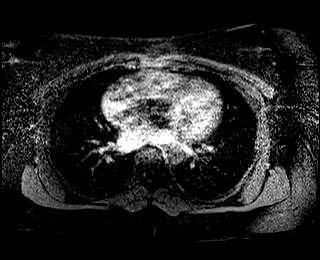

[Series 26: T1 dynamic fat-sat · axial · 3.0mm · 1.06mm/px · z∈[-16,+221]mm · 3 of 80 slices shown (5 of 5)]
[im 1/80]
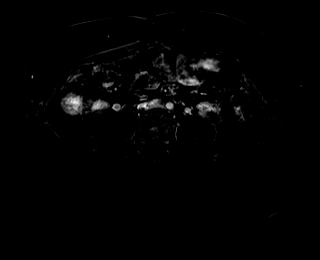
[im 40/80]
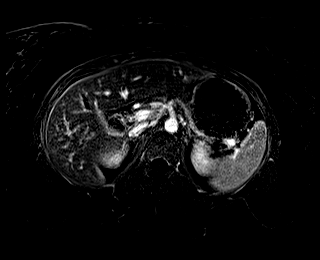
[im 80/80]
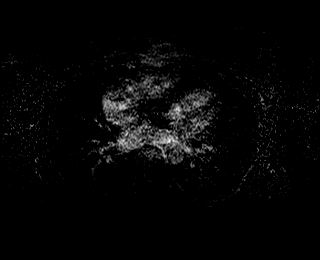

[Series 27: T1 dynamic post-contrast · coronal · 3.0mm · 1.12mm/px · 2 of 64 slices shown (2 of 2)]
[im 1/64]
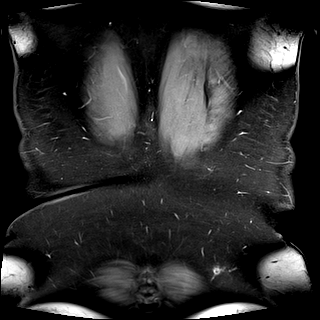
[im 64/64]
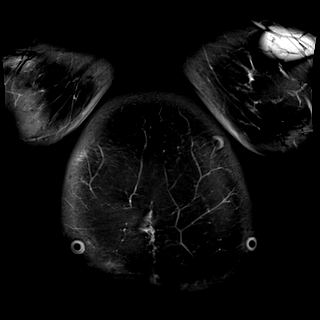

[44 of 48 positions shown; findings below may reference images not displayed]

FINDINGS: Lower chest: Unremarkable.

Hepatobiliary: No suspicious cystic or solid hepatic lesions. Mild
intra and extrahepatic biliary ductal dilatation noted on MRCP
images. Common bile duct measures up to 8 mm in the porta hepatis.
There is a tiny filling defect in the distal common bile duct (axial
image 21 of series 4 and MRCP image 61 of series 13), compatible
with choledocholithiasis, immediately before the level of the
ampulla. Numerous tiny filling defects are also noted within the
gallbladder, compatible with gallstones. Gallbladder is only
moderately distended. Gallbladder wall thickness is normal. No
significant volume of pericholecystic fluid or surrounding
inflammatory changes.

Pancreas: No pancreatic mass. No pancreatic ductal dilatation. No
pancreatic or peripancreatic fluid collections or inflammatory
changes.

Spleen:  Unremarkable.

Adrenals/Urinary Tract: Bilateral kidneys and adrenal glands are
normal in appearance. No hydroureteronephrosis in the visualized
portions of the abdomen. Bilateral adrenal glands are normal in
appearance.

Stomach/Bowel: Unremarkable.

Vascular/Lymphatic: No aneurysm identified in the visualized
abdominal vasculature. No lymphadenopathy noted in the abdomen.

Other: No significant volume of ascites noted in the visualized
portions of the peritoneal cavity.

Musculoskeletal: No aggressive appearing osseous lesions are noted
in the visualized portions of the skeleton.
IMPRESSION: 1. Study is positive for cholelithiasis and choledocholithiasis. No
evidence of cholecystitis at this time. However, there is very mild
intra and extrahepatic biliary ductal dilatation indicating
obstruction at the level of the distal common bile duct from
choledocholithiasis.

## 2022-03-08 ENCOUNTER — Encounter: Payer: Self-pay | Admitting: Emergency Medicine

## 2022-03-08 ENCOUNTER — Ambulatory Visit
Admission: EM | Admit: 2022-03-08 | Discharge: 2022-03-08 | Disposition: A | Discharge status: Home / Self Care | Payer: Medicaid Other | Attending: Physician Assistant | Admitting: Physician Assistant

## 2022-03-08 DIAGNOSIS — M545 Low back pain, unspecified: Secondary | ICD-10-CM | POA: Diagnosis not present

## 2022-03-08 MED ORDER — BACLOFEN 10 MG PO TABS: 10.0000 mg | ORAL_TABLET | Freq: Three times a day (TID) | ORAL | 0 refills | Status: DC | PRN

## 2022-03-08 MED ORDER — KETOROLAC TROMETHAMINE 60 MG/2ML IM SOLN
60.0000 mg | Freq: Once | INTRAMUSCULAR | Status: AC
  Administered 2022-03-08: 60 mg via INTRAMUSCULAR

## 2022-03-08 MED ORDER — HYDROCODONE-ACETAMINOPHEN 5-325 MG PO TABS: 1.0000 | ORAL_TABLET | Freq: Four times a day (QID) | ORAL | 0 refills | Status: AC | PRN

## 2022-03-08 MED ORDER — METHYLPREDNISOLONE 4 MG PO TBPK: ORAL_TABLET | ORAL | 0 refills | Status: DC

## 2022-03-08 NOTE — ED Provider Notes (Signed)
[facility]    CSN: [id number] Arrival date & time: 03/08/22  3474      History   Chief Complaint Chief Complaint  Patient presents with   Back Pain    HPI BETTI GOODENOW is a 26 y.o. female presenting for approximately 49-month history of intermittent lower back pain which has become worse over the past 1 week and began radiating to bilateral hips last night.  It does not radiate past the lateral thighs and is not associated with any numbness, weakness or tingling.  Patient reports that she is 7 months postpartum and symptoms started after she had her baby.  She takes ibuprofen and Tylenol and says that typically helps but did not really help last night.  Reports increased pain with leaning forward and with position changes from sitting to standing.  Reports increased pain when she walks as well and with sitting too.  Patient denies any issues with her back in the past.  History of Crohn's and PCOS.  HPI  Past Medical History:  Diagnosis Date   Crohn disease (HCC)    PCOS (polycystic ovarian syndrome)     Patient Active Problem List   Diagnosis Date Noted   Choledocholithiasis 11/06/2021   Post-dates pregnancy 07/15/2021   Uterine contractions during pregnancy    Pregnancy 07/12/2021   Group B streptococcal carriage complicating pregnancy 06/29/2021   Indication for care in labor or delivery 05/16/2021   Abdominal pregnancy with intrauterine pregnancy 01/06/2021   Subchorionic hematoma in first trimester 12/29/2020   History of infertility 12/29/2020   Obesity (BMI 30-39.9) 12/29/2020   PCO (polycystic ovaries) 12/29/2020   Iron deficiency 03/13/2018   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   Neck pain 02/26/2018   Symptomatic mammary hypertrophy 02/26/2018   Crohn's colitis (HCC) 09/12/2017   Other headache syndrome 03/26/2017   Chest pain 03/26/2017   Esophageal reflux 03/26/2017   Asthma 09/30/2014   Anemia 09/30/2014   Keloid scar  09/19/2011   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   Peritoneal abscess (HCC) 03/13/2010   Crohn's disease without complication (HCC) 03/08/2004    Past Surgical History:  Procedure Laterality Date   APPENDECTOMY     CESAREAN SECTION  07/15/2021   Procedure: CESAREAN SECTION;  Surgeon: Linzie Collin, MD;  Location: [facility] ORS;  Service: Obstetrics;;   COLECTOMY     COLOSTOMY     COLOSTOMY REVERSAL     ERCP N/A 11/07/2021   Procedure: ENDOSCOPIC RETROGRADE CHOLANGIOPANCREATOGRAPHY (ERCP);  Surgeon: Midge Minium, MD;  Location: [facility] ENDOSCOPY;  Service: Endoscopy;  Laterality: N/A;    OB History     Gravida  1   Para  1   Term  1   Preterm  0   AB  0   Living  1      SAB  0   IAB  0   Ectopic  0   Multiple  0   Live Births  1            Home Medications    Prior to Admission medications   Medication Sig Start Date End Date Taking? Authorizing Provider  acetaminophen (TYLENOL) 325 MG tablet Take 2 tablets (650 mg total) by mouth every 6 (six) hours as needed for mild pain (or Fever >/= 101). 11/09/21  Yes Lurene Shadow, MD  baclofen (LIORESAL) 10 MG tablet Take 1 tablet (10 mg total) by mouth 3 (three) times daily as needed for muscle spasms. 03/08/22  Yes Michiel Cowboy,  Algis Greenhouse, PA-C  HYDROcodone-acetaminophen (NORCO/VICODIN) 5-325 MG tablet Take 1 tablet by mouth every 6 (six) hours as needed for up to 3 days. 03/08/22 03/11/22 Yes Shirlee Latch, PA-C  ibuprofen (ADVIL) 600 MG tablet Take 1 tablet (600 mg total) by mouth every 6 (six) hours as needed. 11/09/21  Yes Lynden Oxford R, PA-C  inFLIXimab (REMICADE IV) Inject 10 mg PE/kg into the vein every 28 (twenty-eight) days.   Yes [provider]  methylPREDNISolone (MEDROL DOSEPAK) 4 MG TBPK tablet Take po according to dose pack 03/08/22  Yes Eusebio Friendly B, PA-C  cholestyramine (QUESTRAN) 4 g packet Take by mouth. 12/20/21 12/20/22  [provider]    Family History Family History  Problem  Relation Age of Onset   Healthy Mother    Asthma Father    Diabetes Maternal Grandmother     Social History Social History   Tobacco Use   Smoking status: Never   Smokeless tobacco: Never  Vaping Use   Vaping Use: Never used  Substance Use Topics   Alcohol use: No   Drug use: No     Allergies   Patient has no known allergies.   Review of Systems Review of Systems  Gastrointestinal:  Negative for abdominal pain.  Musculoskeletal:  Positive for back pain and myalgias (thighs). Negative for gait problem.  Neurological:  Negative for weakness and numbness.    Physical Exam Triage Vital Signs ED Triage Vitals  Enc Vitals Group     BP      Pulse      Resp      Temp      Temp src      SpO2      Weight      Height      Head Circumference      Peak Flow      Pain Score      Pain Loc      Pain Edu?      Excl. in GC?    No data found.  Updated Vital Signs BP 124/84 (BP Location: Left Arm)   Pulse 74   Temp 97.8 F (36.6 C) (Oral)   Resp 18   Ht  (1.499 m)   Wt 164 lb (74.4 kg)   LMP 03/04/2022   SpO2 100%   Breastfeeding No   BMI 33.12 kg/m   Physical Exam Vitals and nursing note reviewed.  Constitutional:      General: She is not in acute distress.    Appearance: Normal appearance. She is not ill-appearing or toxic-appearing.  HENT:     Head: Normocephalic and atraumatic.  Eyes:     General: No scleral icterus.       Right eye: No discharge.        Left eye: No discharge.     Conjunctiva/sclera: Conjunctivae normal.  Cardiovascular:     Rate and Rhythm: Normal rate and regular rhythm.     Heart sounds: Normal heart sounds.  Pulmonary:     Effort: Pulmonary effort is normal. No respiratory distress.     Breath sounds: Normal breath sounds.  Musculoskeletal:     Cervical back: Neck supple.     Lumbar back: Tenderness (as shown in image, paralumbar muscles bilaterally) present. Decreased range of motion (in all directions significantly  due to pain/guarding). Negative right straight leg raise test and negative left straight leg raise test.       Back:  Skin:    General:  Skin is dry.  Neurological:     General: No focal deficit present.     Mental Status: She is alert. Mental status is at baseline.     Motor: No weakness.     Gait: Gait normal.  Psychiatric:        Mood and Affect: Mood normal.        Behavior: Behavior normal.        Thought Content: Thought content normal.     UC Treatments / Results  Labs (all labs ordered are listed, but only abnormal results are displayed) Labs Reviewed - No data to display  EKG   Radiology No results found.  Procedures Procedures (including critical care time)  Medications Ordered in UC Medications  ketorolac (TORADOL) injection 60 mg (60 mg Intramuscular Given 03/08/22 1115)    Initial Impression / Assessment and Plan / UC Course  I have reviewed the triage vital signs and the nursing notes.  Pertinent labs & imaging results that were available during my care of the patient were reviewed by me and considered in my medical decision making (see chart for details).  26 year old female presenting for lower back pain off and on for the past 7 months which has become worse over the past 1 week and starting to radiate to bilateral hips over the past day.  Has taken ibuprofen and Tylenol with some improvement in symptoms.  Has only taken Tylenol today.  No numbness, weakness or tingling.  No falls or injuries, saddle anesthesia.  Vitals normal and stable patient overall well-appearing.  She does have tenderness to palpation of bilateral paralumbar muscles.  No spinal tenderness.  Significantly reduced range of motion of back in all directions due to pain and guarding.  Negative straight leg raise on the right and left.  5 /5 strength bilateral lower extremities.  Suspect patient may possibly have lumbar strain but given that it is been ongoing for the past 7 months, may need  MRI.  We will try course of Medrol at this time.  Also provided baclofen and short supply of Norco after being controlled substance database.  She was given 60 mg IM ketorolac in clinic.  She is not breast-feeding.  Reviewed red flag signs and symptoms with patient.   Final Clinical Impressions(s) / UC Diagnoses   Final diagnoses:  Bilateral low back pain, unspecified chronicity, unspecified whether sciatica present     Discharge Instructions      BACK PAIN: Stressed avoiding painful activities . RICE (REST, ICE, COMPRESSION, ELEVATION) guidelines reviewed. May alternate ice and heat. Consider use of muscle rubs, Salonpas patches, etc. Use medications as directed including muscle relaxers if prescribed. Take anti-inflammatory medications as prescribed or OTC NSAIDs/Tylenol.  F/u with PCP in 7-10 days for reexamination, and please feel free to call or return to the urgent care at any time for any questions or concerns you may have and we will be happy to help you!   BACK PAIN RED FLAGS: If the back pain acutely worsens or there are any red flag symptoms such as numbness/tingling, leg weakness, saddle anesthesia, or loss of bowel/bladder control, go immediately to the ER. Follow up with [place] as scheduled or sooner if the pain does not begin to resolve or if it worsens before the follow up    You may have a condition requiring you to follow up with Orthopedics so please call one of the following office for appointment:   [facility] [address], [place], [place]  [address] Phone: [phone]  [facility] [address] [place], [place] [address] Phone: [phone]      ED Prescriptions     Medication Sig Dispense Auth. Provider   methylPREDNISolone (MEDROL DOSEPAK) 4 MG TBPK tablet Take po according to dose pack 21 tablet Eusebio Friendly B, PA-C   baclofen (LIORESAL) 10 MG tablet Take 1 tablet (10 mg total) by mouth 3 (three) times daily as needed for muscle spasms. 30 each Shirlee Latch, PA-C   HYDROcodone-acetaminophen (NORCO/VICODIN) 5-325 MG tablet Take 1 tablet by mouth every 6 (six) hours as needed for up to 3 days. 10 tablet Shirlee Latch, PA-C      I have reviewed the PDMP during this encounter.   Shirlee Latch, PA-C 03/08/22 1149

## 2022-03-08 NOTE — Discharge Instructions (Addendum)
BACK PAIN: Stressed avoiding painful activities . RICE (REST, ICE, COMPRESSION, ELEVATION) guidelines reviewed. May alternate ice and heat. Consider use of muscle rubs, Salonpas patches, etc. Use medications as directed including muscle relaxers if prescribed. Take anti-inflammatory medications as prescribed or OTC NSAIDs/Tylenol.  F/u with PCP in 7-10 days for reexamination, and please feel free to call or return to the urgent care at any time for any questions or concerns you may have and we will be happy to help you!  ° °BACK PAIN RED FLAGS: If the back pain acutely worsens or there are any red flag symptoms such as numbness/tingling, leg weakness, saddle anesthesia, or loss of bowel/bladder control, go immediately to the ER. Follow up with us as scheduled or sooner if the pain does not begin to resolve or if it worsens before the follow up   ° °You may have a condition requiring you to follow up with Orthopedics so please call one of the following office for appointment:  ° °[facility] °[address], [place], [place] [address] °Phone: [phone] ° °[facility] °[address], [place], [place] [address] °Phone: [phone]  °

## 2022-03-08 NOTE — ED Triage Notes (Signed)
Patient c/o low back pain x 1 week, worse last night.  Pain radiates down both legs, no injury.  Patient has been taken Ibuprofen and Tylenol.

## 2022-09-10 ENCOUNTER — Ambulatory Visit
Admission: EM | Admit: 2022-09-10 | Discharge: 2022-09-10 | Disposition: A | Discharge status: Home / Self Care | Payer: Medicaid Other | Attending: Physician Assistant | Admitting: Physician Assistant

## 2022-09-10 DIAGNOSIS — J020 Streptococcal pharyngitis: Secondary | ICD-10-CM | POA: Insufficient documentation

## 2022-09-10 LAB — GROUP A STREP BY PCR: Group A Strep by PCR: DETECTED — AB

## 2022-09-10 MED ORDER — AMOXICILLIN 500 MG PO CAPS: 500.0000 mg | ORAL_CAPSULE | Freq: Two times a day (BID) | ORAL | 0 refills | Status: DC

## 2022-09-10 NOTE — ED Provider Notes (Signed)
[facility]    CSN: [id number] Arrival date & time: 09/10/22  1507      History   Chief Complaint Chief Complaint  Patient presents with   Sore Throat   Cough    HPI Sandra Wilkerson is a 26 y.o. female.   Patient presents today with a 4 to 5-day history of URI symptoms including congestion, mild cough, sore throat.  Denies any chest pain, shortness of breath, fever, nausea, vomiting.  She does report her mother had strep and she was exposed to her last week.  Denies any additional sick contacts.  She has not had COVID or influenza vaccine.  She has had COVID with last episode several years ago.  Denies history of asthma, COPD, smoking, allergies.  She has tried Tylenol over-the-counter cold and flu without improvement of symptoms.  She is on immune modulating medication (Remicade) for Crohn's disease but is doing well on this medicine.  Denies any recent antibiotics or steroids.  She recently (on Friday, 09/08/2022) found out that she is pregnant.  She has an appointment coming up with OB/GYN on Thursday (09/14/2022) to initiate prenatal care.    Past Medical History:  Diagnosis Date   Crohn disease ([place])    PCOS (polycystic ovarian syndrome)     Patient Active Problem List   Diagnosis Date Noted   Choledocholithiasis 11/06/2021   Post-dates pregnancy 07/15/2021   Uterine contractions during pregnancy    Pregnancy 07/12/2021   Group B streptococcal carriage complicating pregnancy 0000000   Indication for care in labor or delivery 05/16/2021   Abdominal pregnancy with intrauterine pregnancy 01/06/2021   Subchorionic hematoma in first trimester 12/29/2020   History of infertility 12/29/2020   Obesity (BMI 30-39.9) 12/29/2020   PCO (polycystic ovaries) 12/29/2020   Iron deficiency 03/13/2018   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   Neck pain 02/26/2018   Symptomatic mammary hypertrophy 02/26/2018   Crohn's colitis ([place]) 09/12/2017   Other  headache syndrome 03/26/2017   Chest pain 03/26/2017   Esophageal reflux 03/26/2017   Asthma 09/30/2014   Anemia 09/30/2014   Keloid scar 09/19/2011   Status post colostomy ([place]) 04/18/2011   Peritoneal abscess ([place]) 03/13/2010   Crohn's disease without complication ([place]) [id number]    Past Surgical History:  Procedure Laterality Date   APPENDECTOMY     CESAREAN SECTION  07/15/2021   Procedure: CESAREAN SECTION;  Surgeon: Harlin Heys, MD;  Location: [facility] ORS;  Service: Obstetrics;;   CHOLECYSTECTOMY     COLECTOMY     COLOSTOMY     COLOSTOMY REVERSAL     ERCP N/A 11/07/2021   Procedure: ENDOSCOPIC RETROGRADE CHOLANGIOPANCREATOGRAPHY (ERCP);  Surgeon: Lucilla Lame, MD;  Location: [facility] ENDOSCOPY;  Service: Endoscopy;  Laterality: N/A;    OB History     Gravida  2   Para  1   Term  1   Preterm  0   AB  0   Living  1      SAB  0   IAB  0   Ectopic  0   Multiple  0   Live Births  1            Home Medications    Prior to Admission medications   Medication Sig Start Date End Date Taking? Authorizing Provider  acetaminophen (TYLENOL) 325 MG tablet Take 2 tablets (650 mg total) by mouth every 6 (six) hours as needed for mild pain (or Fever >/= 101). 11/09/21  Yes [place],  Ilona Sorrel, MD  amoxicillin (AMOXIL) 500 MG capsule Take 1 capsule (500 mg total) by mouth 2 (two) times daily. 09/10/22  Yes Kella Splinter, Junie Panning K, PA-C  cholestyramine (QUESTRAN) 4 g packet Take by mouth. 12/20/21 12/20/22 Yes [provider]  inFLIXimab (REMICADE IV) Inject 10 mg PE/kg into the vein every 28 (twenty-eight) days.   Yes [provider]  baclofen (LIORESAL) 10 MG tablet Take 1 tablet (10 mg total) by mouth 3 (three) times daily as needed for muscle spasms. 03/08/22   Danton Clap, PA-C  methylPREDNISolone (MEDROL DOSEPAK) 4 MG TBPK tablet Take po according to dose pack 03/08/22   Danton Clap, PA-C    Family History Family History  Problem Relation Age of  Onset   Healthy Mother    Asthma Father    Diabetes Maternal Grandmother     Social History Social History   Tobacco Use   Smoking status: Never   Smokeless tobacco: Never  Vaping Use   Vaping Use: Never used  Substance Use Topics   Alcohol use: No   Drug use: No     Allergies   Patient has no known allergies.   Review of Systems Review of Systems  Constitutional:  Positive for activity change. Negative for appetite change, fatigue and fever.  HENT:  Positive for congestion and sore throat. Negative for sinus pressure and sneezing.   Respiratory:  Positive for cough. Negative for shortness of breath.   Cardiovascular:  Negative for chest pain.  Gastrointestinal:  Negative for abdominal pain, diarrhea, nausea and vomiting.  Musculoskeletal:  Positive for arthralgias and myalgias.  Neurological:  Positive for headaches. Negative for dizziness and light-headedness.     Physical Exam Triage Vital Signs ED Triage Vitals [09/10/22 1631]  Enc Vitals Group     BP 126/83     Pulse Rate 92     Resp 18     Temp 98.5 F (36.9 C)     Temp Source Oral     SpO2 96 %     Weight      Height      Head Circumference      Peak Flow      Pain Score 5     Pain Loc      Pain Edu?      Excl. in [place]?    No data found.  Updated Vital Signs BP 126/83 (BP Location: Right Arm)   Pulse 92   Temp 98.5 F (36.9 C) (Oral)   Resp 18   LMP 03/04/2022   SpO2 96%   Visual Acuity Right Eye Distance:   Left Eye Distance:   Bilateral Distance:    Right Eye Near:   Left Eye Near:    Bilateral Near:     Physical Exam Vitals reviewed.  Constitutional:      General: She is awake. She is not in acute distress.    Appearance: Normal appearance. She is well-developed. She is not ill-appearing.     Comments: Very pleasant female appears stated age in no acute distress sitting comfortably in exam room  HENT:     Head: Normocephalic and atraumatic.     Right Ear: Tympanic membrane,  ear canal and external ear normal. Tympanic membrane is not erythematous or bulging.     Left Ear: Tympanic membrane, ear canal and external ear normal. Tympanic membrane is not erythematous or bulging.     Nose:     Right Sinus: No maxillary sinus tenderness or  frontal sinus tenderness.     Left Sinus: No maxillary sinus tenderness or frontal sinus tenderness.     Mouth/Throat:     Pharynx: Uvula midline. Posterior oropharyngeal erythema present. No oropharyngeal exudate.     Tonsils: Tonsillar exudate present. No tonsillar abscesses. 3+ on the right. 3+ on the left.  Cardiovascular:     Rate and Rhythm: Normal rate and regular rhythm.     Heart sounds: Normal heart sounds, S1 normal and S2 normal. No murmur heard. Pulmonary:     Effort: Pulmonary effort is normal.     Breath sounds: Normal breath sounds. No wheezing, rhonchi or rales.     Comments: Clear to auscultation bilaterally Lymphadenopathy:     Head:     Right side of head: No submental, submandibular or tonsillar adenopathy.     Left side of head: No submental, submandibular or tonsillar adenopathy.     Cervical: No cervical adenopathy.  Psychiatric:        Behavior: Behavior is cooperative.      UC Treatments / Results  Labs (all labs ordered are listed, but only abnormal results are displayed) Labs Reviewed  GROUP A STREP BY PCR - Abnormal; Notable for the following components:      Result Value   Group A Strep by PCR DETECTED (*)    All other components within normal limits    EKG   Radiology No results found.  Procedures Procedures (including critical care time)  Medications Ordered in UC Medications - No data to display  Initial Impression / Assessment and Plan / UC Course  I have reviewed the triage vital signs and the nursing notes.  Pertinent labs & imaging results that were available during my care of the patient were reviewed by me and considered in my medical decision making (see chart for  details).     Patient is positive for strep.  She is afebrile, well-appearing, nontoxic, nontachycardic.  Will treat with amoxicillin 500 mg twice daily for 10 days.  Discussed that she should throw away her toothbrush a few days after starting course in order to prevent any infection.  Recommend she gargle with warm salt water and use Tylenol for pain relief as needed.  Discussed he should avoid NSAIDs over-the-counter given recent positive pregnancy test.  She is already established with OB/GYN and was at home encouraged to keep this appointment later this week.  Discussed that if she has any worsening or changing symptoms including swelling of her throat, shortness of breath, muffled voice, high fever not responding to medication, nausea/vomiting interfere with oral intake she needs to be seen immediately.  Strict return precautions given.  Work excuse note provided.  Final Clinical Impressions(s) / UC Diagnoses   Final diagnoses:  Strep pharyngitis     Discharge Instructions      You tested positive for strep.  Start amoxicillin twice daily for 10 days.  Throw your toothbrush a few days after starting medication to prevent reinfection.  Gargle with warm salt water and use Tylenol for pain.  If you have any changing or worsening symptoms including increased pain, fever, swelling of your throat, muffled voice, trouble swallowing you should be seen immediately.  Make sure to follow-up as scheduled with OB/GYN later this week.     ED Prescriptions     Medication Sig Dispense Auth. Provider   amoxicillin (AMOXIL) 500 MG capsule Take 1 capsule (500 mg total) by mouth 2 (two) times daily. 20 capsule Charly Hunton, Noberto Retort,  PA-C      PDMP not reviewed this encounter.   Terrilee Croak, PA-C 09/10/22 1725

## 2022-09-10 NOTE — Discharge Instructions (Signed)
You tested positive for strep.  Start amoxicillin twice daily for 10 days.  Throw your toothbrush a few days after starting medication to prevent reinfection.  Gargle with warm salt water and use Tylenol for pain.  If you have any changing or worsening symptoms including increased pain, fever, swelling of your throat, muffled voice, trouble swallowing you should be seen immediately.  Make sure to follow-up as scheduled with OB/GYN later this week.

## 2022-09-10 NOTE — ED Triage Notes (Signed)
Thursday started to have a sore throat, runny nose, cough with chest congestion. Pt states her mom just had strep throat.Taking tylenol cold and flu but stopped Friday she had a positive pregnancy test and didn't know what medications are safe to take.

## 2022-09-14 ENCOUNTER — Ambulatory Visit (INDEPENDENT_AMBULATORY_CARE_PROVIDER_SITE_OTHER): Payer: Medicaid Other

## 2022-09-14 VITALS — BP 121/84 | HR 96 | Ht 59.0 in | Wt 171.0 lb

## 2022-09-14 DIAGNOSIS — Z3201 Encounter for pregnancy test, result positive: Secondary | ICD-10-CM

## 2022-09-14 DIAGNOSIS — Z3200 Encounter for pregnancy test, result unknown: Secondary | ICD-10-CM

## 2022-09-14 LAB — POCT URINE PREGNANCY: Preg Test, Ur: POSITIVE — AB

## 2022-09-14 NOTE — Progress Notes (Signed)
Patient presents for evaluation of amenorrhea. She believes she could be pregnant.  Current symptoms also include, nausea Urine UDT:HYHOOILN Briefly discussed pre-natal care options. She will schedule her appointments at check out. New OB packet given as safe medications are on there.

## 2022-10-05 ENCOUNTER — Ambulatory Visit (INDEPENDENT_AMBULATORY_CARE_PROVIDER_SITE_OTHER): Payer: Medicaid Other

## 2022-10-05 ENCOUNTER — Other Ambulatory Visit: Payer: Self-pay

## 2022-10-05 VITALS — Wt 174.0 lb

## 2022-10-05 DIAGNOSIS — Z369 Encounter for antenatal screening, unspecified: Secondary | ICD-10-CM

## 2022-10-05 DIAGNOSIS — Z3480 Encounter for supervision of other normal pregnancy, unspecified trimester: Secondary | ICD-10-CM | POA: Insufficient documentation

## 2022-10-05 DIAGNOSIS — Z3689 Encounter for other specified antenatal screening: Secondary | ICD-10-CM

## 2022-10-05 MED ORDER — PRENATE PIXIE 10-0.6-0.4-200 MG PO CAPS: 1.0000 | ORAL_CAPSULE | Freq: Every day | ORAL | 11 refills | Status: DC

## 2022-10-05 NOTE — Progress Notes (Signed)
New OB Intake  I connected with  Sandra Wilkerson on 10/05/22 at  by telephone and verified that I am speaking with the correct person using two identifiers. Nurse is located at [facility] and pt is located at in car at work.  I explained I am completing New OB Intake today. We discussed her EDD of 05/14/2023 that is based on LMP of 08/07/2022. Pt is G2/P1001. I reviewed her allergies, medications, Medical/Surgical/OB history, and appropriate screenings. Based on history, this is a/an pregnancy uncomplicated .   Patient Active Problem List   Diagnosis Date Noted   Supervision of other normal pregnancy, antepartum 10/05/2022   Choledocholithiasis 11/06/2021   Post-dates pregnancy 07/15/2021   Uterine contractions during pregnancy    Pregnancy 07/12/2021   Group B streptococcal carriage complicating pregnancy 06/29/2021   Indication for care in labor or delivery 05/16/2021   Abdominal pregnancy with intrauterine pregnancy 01/06/2021   Subchorionic hematoma in first trimester 12/29/2020   History of infertility 12/29/2020   Obesity (BMI 30-39.9) 12/29/2020   PCO (polycystic ovaries) 12/29/2020   Iron deficiency 03/13/2018   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   Neck pain 02/26/2018   Symptomatic mammary hypertrophy 02/26/2018   Crohn's colitis (HCC) 09/12/2017   Other headache syndrome 03/26/2017   Chest pain 03/26/2017   Esophageal reflux 03/26/2017   Asthma 09/30/2014   Anemia 09/30/2014   Keloid scar 09/19/2011   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  Anatomy US Explained first scheduled [place] will be scheduled soon and an anatomy scan will be done at 20 weeks.  Labs Discussed genetic screening with patient. Patient desires genetic testing to be drawn with new OB  labs.  Discussed possible labs to be drawn at new OB appointment.  COVID Vaccine Patient has not had COVID vaccine.   Social Determinants of Health Food Insecurity: denies food insecurity.  Transportation: Patient denies transportation needs. Childcare: Discussed no children allowed at ultrasound appointments.   First visit review I reviewed new OB appt with pt. I explained she will have ob bloodwork and pap smear/pelvic exam if indicated. Explained pt will be seen by Dr. Nicholaus Bloom at first visit; encounter routed to appropriate provider.   Loran Senters, [place] 10/05/2022  2:45 PM

## 2022-10-16 LAB — OB RESULTS CONSOLE VARICELLA ZOSTER ANTIBODY, IGG: Varicella: IMMUNE

## 2022-10-26 ENCOUNTER — Other Ambulatory Visit (HOSPITAL_COMMUNITY)
Admission: RE | Admit: 2022-10-26 | Discharge: 2022-10-26 | Disposition: A | Discharge status: Home / Self Care | Payer: Medicaid Other | Source: Ambulatory Visit | Attending: Obstetrics & Gynecology | Admitting: Obstetrics & Gynecology

## 2022-10-26 ENCOUNTER — Other Ambulatory Visit: Payer: Medicaid Other

## 2022-10-26 ENCOUNTER — Other Ambulatory Visit (HOSPITAL_COMMUNITY): Payer: Self-pay | Admitting: Obstetrics & Gynecology

## 2022-10-26 DIAGNOSIS — Z3480 Encounter for supervision of other normal pregnancy, unspecified trimester: Secondary | ICD-10-CM | POA: Diagnosis present

## 2022-10-26 DIAGNOSIS — Z369 Encounter for antenatal screening, unspecified: Secondary | ICD-10-CM

## 2022-10-27 LAB — URINALYSIS, ROUTINE W REFLEX MICROSCOPIC
Bilirubin, UA: NEGATIVE
Glucose, UA: NEGATIVE
Ketones, UA: NEGATIVE
Leukocytes,UA: NEGATIVE
Nitrite, UA: NEGATIVE
RBC, UA: NEGATIVE
Specific Gravity, UA: 1.027 (ref 1.005–1.030)
Urobilinogen, Ur: 0.2 mg/dL (ref 0.2–1.0)
pH, UA: 6.5 (ref 5.0–7.5)

## 2022-10-27 LAB — CBC/D/PLT+RPR+RH+ABO+RUBIGG...
Antibody Screen: NEGATIVE
Basophils Absolute: 0.1 10*3/uL (ref 0.0–0.2)
Basos: 1 %
EOS (ABSOLUTE): 0.2 10*3/uL (ref 0.0–0.4)
Eos: 2 %
HCV Ab: NONREACTIVE
HIV Screen 4th Generation wRfx: NONREACTIVE
Hematocrit: 36.4 % (ref 34.0–46.6)
Hemoglobin: 11.6 g/dL (ref 11.1–15.9)
Hepatitis B Surface Ag: NEGATIVE
Immature Grans (Abs): 0 10*3/uL (ref 0.0–0.1)
Immature Granulocytes: 0 %
Lymphocytes Absolute: 3.6 10*3/uL — ABNORMAL HIGH (ref 0.7–3.1)
Lymphs: 33 %
MCH: 24.3 pg — ABNORMAL LOW (ref 26.6–33.0)
MCHC: 31.9 g/dL (ref 31.5–35.7)
MCV: 76 fL — ABNORMAL LOW (ref 79–97)
Monocytes Absolute: 0.6 10*3/uL (ref 0.1–0.9)
Monocytes: 6 %
Neutrophils Absolute: 6.4 10*3/uL (ref 1.4–7.0)
Neutrophils: 58 %
Platelets: 334 10*3/uL (ref 150–450)
RBC: 4.78 x10E6/uL (ref 3.77–5.28)
RDW: 15.9 % — ABNORMAL HIGH (ref 11.7–15.4)
RPR Ser Ql: NONREACTIVE
Rh Factor: NEGATIVE
Rubella Antibodies, IGG: 10.7 index (ref >0.99)
Varicella zoster IgG: 487 index (ref >165)
WBC: 10.9 10*3/uL — ABNORMAL HIGH (ref 3.4–10.8)

## 2022-10-27 LAB — NICOTINE SCREEN, URINE: Cotinine Ql Scrn, Ur: NEGATIVE ng/mL

## 2022-10-27 LAB — MONITOR DRUG PROFILE 14(MW)
Amphetamine Scrn, Ur: NEGATIVE ng/mL
BARBITURATE SCREEN URINE: NEGATIVE ng/mL
BENZODIAZEPINE SCREEN, URINE: NEGATIVE ng/mL
Buprenorphine, Urine: NEGATIVE ng/mL
CANNABINOIDS UR QL SCN: NEGATIVE ng/mL
Cocaine (Metab) Scrn, Ur: NEGATIVE ng/mL
Creatinine(Crt), U: 303.7 mg/dL — ABNORMAL HIGH (ref 20.0–300.0)
Fentanyl, Urine: NEGATIVE pg/mL
Meperidine Screen, Urine: NEGATIVE ng/mL
Methadone Screen, Urine: NEGATIVE ng/mL
OXYCODONE+OXYMORPHONE UR QL SCN: NEGATIVE ng/mL
Opiate Scrn, Ur: NEGATIVE ng/mL
Ph of Urine: 6.1 (ref 4.5–8.9)
Phencyclidine Qn, Ur: NEGATIVE ng/mL
Propoxyphene Scrn, Ur: NEGATIVE ng/mL
SPECIFIC GRAVITY: 1.02
Tramadol Screen, Urine: NEGATIVE ng/mL

## 2022-10-27 LAB — HCV INTERPRETATION

## 2022-10-28 LAB — CULTURE, OB URINE

## 2022-10-28 LAB — URINE CULTURE, OB REFLEX

## 2022-10-30 ENCOUNTER — Encounter: Payer: Self-pay | Admitting: Obstetrics & Gynecology

## 2022-10-30 ENCOUNTER — Other Ambulatory Visit: Payer: Medicaid Other

## 2022-10-30 ENCOUNTER — Ambulatory Visit
Admission: RE | Admit: 2022-10-30 | Discharge: 2022-10-30 | Disposition: A | Discharge status: Home / Self Care | Payer: Medicaid Other | Source: Ambulatory Visit | Attending: Obstetrics & Gynecology | Admitting: Obstetrics & Gynecology

## 2022-10-30 ENCOUNTER — Other Ambulatory Visit: Payer: Self-pay | Admitting: Obstetrics & Gynecology

## 2022-10-30 DIAGNOSIS — O09899 Supervision of other high risk pregnancies, unspecified trimester: Secondary | ICD-10-CM | POA: Insufficient documentation

## 2022-10-30 DIAGNOSIS — Z369 Encounter for antenatal screening, unspecified: Secondary | ICD-10-CM

## 2022-10-30 DIAGNOSIS — Z3480 Encounter for supervision of other normal pregnancy, unspecified trimester: Secondary | ICD-10-CM

## 2022-10-30 DIAGNOSIS — Z3A12 12 weeks gestation of pregnancy: Secondary | ICD-10-CM | POA: Insufficient documentation

## 2022-10-30 DIAGNOSIS — O26899 Other specified pregnancy related conditions, unspecified trimester: Secondary | ICD-10-CM | POA: Insufficient documentation

## 2022-10-30 LAB — MOLECULAR ANCILLARY ONLY
Chlamydia: NEGATIVE
Comment: NEGATIVE
Comment: NORMAL
Neisseria Gonorrhea: NEGATIVE

## 2022-11-03 LAB — MATERNIT 21 PLUS CORE, BLOOD
Fetal Fraction: 5
Result (T21): NEGATIVE
Trisomy 13 (Patau syndrome): NEGATIVE
Trisomy 18 (Edwards syndrome): NEGATIVE
Trisomy 21 (Down syndrome): NEGATIVE

## 2022-11-06 ENCOUNTER — Encounter: Payer: Self-pay | Admitting: Obstetrics & Gynecology

## 2022-11-06 ENCOUNTER — Other Ambulatory Visit (HOSPITAL_COMMUNITY)
Admission: RE | Admit: 2022-11-06 | Discharge: 2022-11-06 | Disposition: A | Discharge status: Home / Self Care | Payer: Medicaid Other | Source: Ambulatory Visit | Attending: Obstetrics & Gynecology | Admitting: Obstetrics & Gynecology

## 2022-11-06 ENCOUNTER — Ambulatory Visit (INDEPENDENT_AMBULATORY_CARE_PROVIDER_SITE_OTHER): Payer: Medicaid Other | Admitting: Obstetrics & Gynecology

## 2022-11-06 VITALS — BP 120/80 | Wt 170.0 lb

## 2022-11-06 DIAGNOSIS — O0932 Supervision of pregnancy with insufficient antenatal care, second trimester: Secondary | ICD-10-CM | POA: Diagnosis present

## 2022-11-06 DIAGNOSIS — O0931 Supervision of pregnancy with insufficient antenatal care, first trimester: Secondary | ICD-10-CM | POA: Diagnosis not present

## 2022-11-06 DIAGNOSIS — Z3689 Encounter for other specified antenatal screening: Secondary | ICD-10-CM

## 2022-11-06 DIAGNOSIS — O09899 Supervision of other high risk pregnancies, unspecified trimester: Secondary | ICD-10-CM

## 2022-11-06 DIAGNOSIS — O99210 Obesity complicating pregnancy, unspecified trimester: Secondary | ICD-10-CM | POA: Insufficient documentation

## 2022-11-06 DIAGNOSIS — Z3A13 13 weeks gestation of pregnancy: Secondary | ICD-10-CM

## 2022-11-06 DIAGNOSIS — O99611 Diseases of the digestive system complicating pregnancy, first trimester: Secondary | ICD-10-CM

## 2022-11-06 DIAGNOSIS — O0992 Supervision of high risk pregnancy, unspecified, second trimester: Secondary | ICD-10-CM

## 2022-11-06 DIAGNOSIS — O34219 Maternal care for unspecified type scar from previous cesarean delivery: Secondary | ICD-10-CM | POA: Insufficient documentation

## 2022-11-06 DIAGNOSIS — K5090 Crohn's disease, unspecified, without complications: Secondary | ICD-10-CM

## 2022-11-06 MED ORDER — ASPIRIN 81 MG PO CHEW: 81.0000 mg | CHEWABLE_TABLET | Freq: Every day | ORAL | 3 refills | Status: DC

## 2022-11-06 NOTE — Progress Notes (Signed)
Subjective:    Sandra Wilkerson is a G33P1001 (27 yo son) at [redacted]w[redacted]d being seen today for her first obstetrical visit.  Her obstetrical history is significant for obesity and rH negative . Patient  is not sure if she  intends to breast feed. Pregnancy history fully reviewed. She has Crohn's disease and gets remicaid IV monthly. She has few symptoms, is doing well.  Patient reports nausea. She has tried Vit B6 and ginger and these help when she takes them.  Vitals:   11/06/22 1408  BP: 120/80  Weight: 170 lb (77.1 kg)    HISTORY: OB History  Gravida Para Term Preterm AB Living  2 1 1  0 0 1  SAB IAB Ectopic Multiple Live Births  0 0 0 0 1    # Outcome Date GA Lbr Len/2nd Weight Sex Delivery Anes PTL Lv  2 Current           1 Term 07/15/21 [redacted]w[redacted]d  7 lb 14.6 oz (3.59 kg) M CS-LTranv EPI  LIV   Past Medical History:  Diagnosis Date   Crohn disease ([place])    PCOS (polycystic ovarian syndrome)    Past Surgical History:  Procedure Laterality Date   APPENDECTOMY     CESAREAN SECTION  07/15/2021   Procedure: CESAREAN SECTION;  Surgeon: Harlin Heys, MD;  Location: [facility] ORS;  Service: Obstetrics;;   CHOLECYSTECTOMY     COLECTOMY     COLOSTOMY     COLOSTOMY REVERSAL     ERCP N/A 11/07/2021   Procedure: ENDOSCOPIC RETROGRADE CHOLANGIOPANCREATOGRAPHY (ERCP);  Surgeon: Lucilla Lame, MD;  Location: [facility] ENDOSCOPY;  Service: Endoscopy;  Laterality: N/A;   Family History  Problem Relation Age of Onset   Healthy Mother    Asthma Father    Healthy Sister    Healthy Sister    Healthy Sister    Healthy Sister    Healthy Sister    Diabetes Maternal Grandmother    Alzheimer's disease Maternal Grandmother    Healthy Maternal Grandfather    Congestive Heart Failure Paternal Grandmother      Exam    Uterus:    13 weeks FHR- 160s  Pelvic Exam:    Perineum: No Hemorrhoids   Vulva: normal   Vagina:  normal mucosa   pH:    Cervix: anteverted   Adnexa: normal adnexa   Bony  Pelvis: android, excellent pelvis for vaginal delivery  System: Breast:  normal appearance, no masses or tenderness   Skin: normal coloration and turgor, no rashes    Neurologic: oriented   Extremities: normal strength, tone, and muscle mass   HEENT PERRLA   Mouth/Teeth mucous membranes moist, pharynx normal without lesions   Neck supple   Cardiovascular: regular rate and rhythm   Respiratory:  appears well, vitals normal, no respiratory distress, acyanotic, normal RR, ear and throat exam is normal, neck free of mass or lymphadenopathy, chest clear, no wheezing, crepitations, rhonchi, normal symmetric air entry   Abdomen: soft, non-tender; bowel sounds normal; no masses,  no organomegaly   Urinary: urethral meatus normal      Assessment:    Pregnancy: G2P1001 Patient Active Problem List   Diagnosis Date Noted   Rh negative state in antepartum period 10/30/2022   Supervision of other high risk pregnancy, antepartum, unspecified trimester 10/30/2022   Supervision of other normal pregnancy, antepartum 10/05/2022   Choledocholithiasis 11/06/2021   History of infertility 12/29/2020   Obesity (BMI 30-39.9) 12/29/2020   PCO (polycystic ovaries) 12/29/2020  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   Neck pain 02/26/2018   Symptomatic mammary hypertrophy 02/26/2018   Crohn's colitis ([place]) 09/12/2017   Other headache syndrome 03/26/2017   Chest pain 03/26/2017   Esophageal reflux 03/26/2017   Asthma 09/30/2014   Anemia 09/30/2014   Keloid scar 09/19/2011   Status post colostomy ([place]) 04/18/2011   Peritoneal abscess ([place]) 03/13/2010   Crohn's disease without complication ([place]) 60/63/0160        Plan:     Initial labs drawn. Prenatal vitamins. Problem list reviewed and updated. Genetic Screening/Mat 21 - low risk female Ultrasound discussed; fetal survey: ordered.  Follow up in 4 weeks. Start baby asa daily Rec 20 pound or less total weight gain Check cmp, upc  ratio Pap smear with CT/GC done  Emily Filbert 11/06/2022

## 2022-11-07 LAB — COMPREHENSIVE METABOLIC PANEL
ALT: 15 IU/L (ref 0–32)
AST: 15 IU/L (ref 0–40)
Albumin/Globulin Ratio: 0.9 — ABNORMAL LOW (ref 1.2–2.2)
Albumin: 3.5 g/dL — ABNORMAL LOW (ref 4.0–5.0)
Alkaline Phosphatase: 80 IU/L (ref 44–121)
BUN/Creatinine Ratio: 11 (ref 9–23)
BUN: 6 mg/dL (ref 6–20)
Bilirubin Total: <0.2 mg/dL (ref 0.0–1.2)
CO2: 21 mmol/L (ref 20–29)
Calcium: 9 mg/dL (ref 8.7–10.2)
Chloride: 100 mmol/L (ref 96–106)
Creatinine, Ser: 0.57 mg/dL (ref 0.57–1.00)
Globulin, Total: 3.7 g/dL (ref 1.5–4.5)
Glucose: 78 mg/dL (ref 70–99)
Potassium: 3.8 mmol/L (ref 3.5–5.2)
Sodium: 135 mmol/L (ref 134–144)
Total Protein: 7.2 g/dL (ref 6.0–8.5)
eGFR: 126 mL/min/{1.73_m2} (ref >59)

## 2022-11-08 LAB — PROTEIN / CREATININE RATIO, URINE
Creatinine, Urine: 351.4 mg/dL
Protein, Ur: 28.4 mg/dL
Protein/Creat Ratio: 81 mg/g creat (ref 0–200)

## 2022-11-08 NOTE — Addendum Note (Signed)
Addended by: Meryl Dare on: 11/08/2022 02:46 PM   Modules accepted: Orders

## 2022-11-08 NOTE — Addendum Note (Signed)
Addended by: Meryl Dare on: 11/08/2022 03:02 PM   Modules accepted: Orders

## 2022-11-09 LAB — CYTOLOGY - PAP
Chlamydia: NEGATIVE
Comment: NEGATIVE
Comment: NORMAL
Diagnosis: NEGATIVE
Neisseria Gonorrhea: NEGATIVE

## 2022-11-11 ENCOUNTER — Emergency Department
Admission: EM | Admit: 2022-11-11 | Discharge: 2022-11-11 | Disposition: A | Discharge status: Home / Self Care | Payer: Medicaid Other | Attending: Emergency Medicine | Admitting: Emergency Medicine

## 2022-11-11 DIAGNOSIS — R059 Cough, unspecified: Secondary | ICD-10-CM | POA: Diagnosis present

## 2022-11-11 DIAGNOSIS — J101 Influenza due to other identified influenza virus with other respiratory manifestations: Secondary | ICD-10-CM | POA: Diagnosis not present

## 2022-11-11 DIAGNOSIS — Z1152 Encounter for screening for COVID-19: Secondary | ICD-10-CM | POA: Diagnosis not present

## 2022-11-11 LAB — RESP PANEL BY RT-PCR (RSV, FLU A&B, COVID)  RVPGX2
Influenza A by PCR: NEGATIVE
Influenza B by PCR: POSITIVE — AB
Resp Syncytial Virus by PCR: NEGATIVE
SARS Coronavirus 2 by RT PCR: NEGATIVE

## 2022-11-11 NOTE — ED Provider Notes (Signed)
[facility] Provider Note    Event Date/Time   First MD Initiated Contact with Patient 11/11/22 1513     (approximate)   History   Flu-like Symptoms   HPI  Sandra Wilkerson is a 27 y.o. female currently approximately [redacted] weeks pregnant who presents today for evaluation of cough, body aches, runny nose that began yesterday.  Patient is unsure of any known sick contacts.  She reports that she had a fever at home but she is not sure how high.  No abdominal pain or pregnancy concerns.  No chest pain or shortness of breath.  No vaginal bleeding.  No dysuria.  Patient Active Problem List   Diagnosis Date Noted   Obesity in pregnancy 11/06/2022   Previous cesarean delivery affecting pregnancy 11/06/2022   Rh negative state in antepartum period 10/30/2022   Supervision of other high risk pregnancy, antepartum, unspecified trimester 10/30/2022   Supervision of other normal pregnancy, antepartum 10/05/2022   Choledocholithiasis 11/06/2021   History of infertility 12/29/2020   Obesity (BMI 30-39.9) 12/29/2020   PCO (polycystic ovaries) 12/29/2020   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   Neck pain 02/26/2018   Symptomatic mammary hypertrophy 02/26/2018   Crohn's colitis ([place]) 09/12/2017   Other headache syndrome 03/26/2017   Chest pain 03/26/2017   Esophageal reflux 03/26/2017   Asthma 09/30/2014   Anemia 09/30/2014   Keloid scar 09/19/2011   Status post colostomy ([place]) 04/18/2011   Peritoneal abscess ([place]) 03/13/2010   Maternal Crohn's disease affecting pregnancy in second trimester (Sandra Wilkerson) 03/08/2004          Physical Exam   Triage Vital Signs: ED Triage Vitals  Enc Vitals Group     BP 11/11/22 1455 135/84     Pulse Rate 11/11/22 1455 (!) 118     Resp 11/11/22 1455 18     Temp 11/11/22 1455 100.3 F (37.9 C)     Temp Source 11/11/22 1455 Oral     SpO2 11/11/22 1455 98 %     Weight 11/11/22 1456 170 lb (77.1 kg)     Height 11/11/22  1456 4\' 11"  (1.499 m)     Head Circumference --      Peak Flow --      Pain Score 11/11/22 1455 5     Pain Loc --      Pain Edu? --      Excl. in [place]? --     Most recent vital signs: Vitals:   11/11/22 1455 11/11/22 1558  BP: 135/84 120/78  Pulse: (!) 118 (!) 108  Resp: 18   Temp: 100.3 F (37.9 C) 99.2 F (37.3 C)  SpO2: 98% 99%    Physical Exam Vitals and nursing note reviewed.  Constitutional:      General: Awake and alert. No acute distress.    Appearance: Normal appearance. The patient is normal weight.  HENT:     Head: Normocephalic and atraumatic.     Mouth: Mucous membranes are moist.  Eyes:     General: PERRL. Normal EOMs        Right eye: No discharge.        Left eye: No discharge.     Conjunctiva/sclera: Conjunctivae normal.  Cardiovascular:     Rate and Rhythm: Normal rate and regular rhythm.     Pulses: Normal pulses.     Heart sounds: Normal heart sounds Pulmonary:     Effort: Pulmonary effort is normal. No respiratory distress.  Breath sounds: Normal breath sounds.  Abdominal:     Abdomen is soft. There is no abdominal tenderness. No rebound or guarding. No distention. Musculoskeletal:        General: No swelling. Normal range of motion.     Cervical back: Normal range of motion and neck supple.  Skin:    General: Skin is warm and dry.     Capillary Refill: Capillary refill takes less than 2 seconds.     Findings: No rash.  Neurological:     Mental Status: The patient is awake and alert.      ED Results / Procedures / Treatments   Labs (all labs ordered are listed, but only abnormal results are displayed) Labs Reviewed  RESP PANEL BY RT-PCR (RSV, FLU A&B, COVID)  RVPGX2 - Abnormal; Notable for the following components:      Result Value   Influenza B by PCR POSITIVE (*)    All other components within normal limits     EKG     RADIOLOGY     PROCEDURES:  Critical Care performed:   Procedures   MEDICATIONS ORDERED IN  ED: Medications - No data to display   IMPRESSION / MDM / [place] / ED COURSE  I reviewed the triage vital signs and the nursing notes.   Differential diagnosis includes, but is not limited to, COVID, influenza, RSV other viral syndrome.  Patient is awake and alert, mildly tachycardic on arrival though normotensive and afebrile.  She has a normal oxygen saturation 99% on room air.  No chest pain, pleurisy, lower extremity edema to suggest PE.  No shortness of breath.  Blood pressures within normal limits.  Swab obtained in triage is positive for influenza B.  Patient declined analgesia or medications.  We discussed the option of Tamiflu which she declined.  We discussed return precautions and the importance of close outpatient follow-up.  Patient understands and agrees with plan.  She was discharged in stable condition.   Patient's presentation is most consistent with acute complicated illness / injury requiring diagnostic workup.     FINAL CLINICAL IMPRESSION(S) / ED DIAGNOSES   Final diagnoses:  Influenza B     Rx / DC Orders   ED Discharge Orders     None        Note:  This document was prepared using Dragon voice recognition software and may include unintentional dictation errors.   Sandra Wilkerson 11/11/22 1615    Sandra Drafts, MD 11/11/22 1620

## 2022-11-11 NOTE — ED Triage Notes (Signed)
Pt presents to the ED via POV due to flu like symptoms. Pt states she is 13 weeks and was told to come to the ED. Pt c/o chills, fever, and cough. Pt denies ND but had one episode of emesis last night. Pt A&Ox4

## 2022-11-11 NOTE — Discharge Instructions (Addendum)
You may continue to take Tylenol 650 mg every 6-8 hours as needed for pain.  Please return for any new, worsening, or change in symptoms or other concerns.  It was a pleasure caring for you today.

## 2022-11-13 ENCOUNTER — Telehealth: Payer: Self-pay

## 2022-11-13 NOTE — Telephone Encounter (Signed)
Patient called nurse line over the weekend with concerns of heartburn, cough and fever. I returned called to check up on patient, there was no answer, LVM. Advised patient to treat symptoms according to safe medication for pregnancy list. Also sent her a copy of the safe medication list to her mychart.

## 2022-11-15 ENCOUNTER — Other Ambulatory Visit: Payer: Self-pay

## 2022-11-15 ENCOUNTER — Emergency Department
Admission: EM | Admit: 2022-11-15 | Discharge: 2022-11-15 | Disposition: A | Discharge status: Home / Self Care | Payer: Medicaid Other | Attending: Emergency Medicine | Admitting: Emergency Medicine

## 2022-11-15 ENCOUNTER — Emergency Department: Payer: Medicaid Other

## 2022-11-15 DIAGNOSIS — R000 Tachycardia, unspecified: Secondary | ICD-10-CM | POA: Insufficient documentation

## 2022-11-15 DIAGNOSIS — O98512 Other viral diseases complicating pregnancy, second trimester: Secondary | ICD-10-CM | POA: Insufficient documentation

## 2022-11-15 DIAGNOSIS — Z3A14 14 weeks gestation of pregnancy: Secondary | ICD-10-CM | POA: Insufficient documentation

## 2022-11-15 DIAGNOSIS — O26892 Other specified pregnancy related conditions, second trimester: Secondary | ICD-10-CM | POA: Diagnosis present

## 2022-11-15 DIAGNOSIS — J111 Influenza due to unidentified influenza virus with other respiratory manifestations: Secondary | ICD-10-CM

## 2022-11-15 LAB — BASIC METABOLIC PANEL
Anion gap: 11 (ref 5–15)
BUN: 9 mg/dL (ref 6–20)
CO2: 18 mmol/L — ABNORMAL LOW (ref 22–32)
Calcium: 8.6 mg/dL — ABNORMAL LOW (ref 8.9–10.3)
Chloride: 103 mmol/L (ref 98–111)
Creatinine, Ser: 0.7 mg/dL (ref 0.44–1.00)
GFR, Estimated: >60 mL/min (ref >60)
Glucose, Bld: 97 mg/dL (ref 70–99)
Potassium: 3.7 mmol/L (ref 3.5–5.1)
Sodium: 132 mmol/L — ABNORMAL LOW (ref 135–145)

## 2022-11-15 LAB — CBC
HCT: 42.8 % (ref 36.0–46.0)
Hemoglobin: 13.9 g/dL (ref 12.0–15.0)
MCH: 24.2 pg — ABNORMAL LOW (ref 26.0–34.0)
MCHC: 32.5 g/dL (ref 30.0–36.0)
MCV: 74.4 fL — ABNORMAL LOW (ref 80.0–100.0)
Platelets: 305 10*3/uL (ref 150–400)
RBC: 5.75 MIL/uL — ABNORMAL HIGH (ref 3.87–5.11)
RDW: 14.8 % (ref 11.5–15.5)
WBC: 11.7 10*3/uL — ABNORMAL HIGH (ref 4.0–10.5)
nRBC: 0 % (ref 0.0–0.2)

## 2022-11-15 LAB — TROPONIN I (HIGH SENSITIVITY)
Troponin I (High Sensitivity): 3 ng/L (ref <18)
Troponin I (High Sensitivity): 3 ng/L (ref <18)

## 2022-11-15 MED ORDER — ACETAMINOPHEN 500 MG PO TABS
1000.0000 mg | ORAL_TABLET | Freq: Once | ORAL | Status: AC
  Administered 2022-11-15: 1000 mg via ORAL
  Filled 2022-11-15: qty 2

## 2022-11-15 MED ORDER — SODIUM CHLORIDE 0.9 % IV BOLUS
1000.0000 mL | Freq: Once | INTRAVENOUS | Status: AC
  Administered 2022-11-15: 1000 mL via INTRAVENOUS

## 2022-11-15 NOTE — ED Notes (Signed)
Pt Dc to home. Dc instructions reviewed with all questions answered. Pt voices understanding. Pt ambulatory out of dept with steady gait 

## 2022-11-15 NOTE — ED Provider Notes (Signed)
[facility] Provider Note    Event Date/Time   First MD Initiated Contact with Patient 11/15/22 2040     (approximate)  History   Chief Complaint: Influenza and Chest Pain  HPI  Sandra Wilkerson is a 27 y.o. female with a past medical history of Crohn's disease, PCOS, [redacted] weeks pregnant who presents to the emergency department for chest discomfort and tightness.  According to the patient approximately 7 days ago she began with cough congestion low-grade fevers.  States on Saturday she came to the emergency department and tested positive for influenza.  Record review shows she tested positive for influenza B.  Patient states she felt like she was getting better however today she has been feeling some chest tightness at times and feels like her heart is racing.  Upon arrival patient noted to be tachycardic at 140 bpm.  Patient denies any "chest pain" but states tightness at times and a fluttering sensation.  No abdominal pain.  Physical Exam   Triage Vital Signs: ED Triage Vitals  Enc Vitals Group     BP 11/15/22 2022 108/84     Pulse Rate 11/15/22 2022 (!) 140     Resp 11/15/22 2022 (!) 22     Temp 11/15/22 2022 98.7 F (37.1 C)     Temp Source 11/15/22 2022 Oral     SpO2 11/15/22 2022 97 %     Weight 11/15/22 2021 170 lb (77.1 kg)     Height 11/15/22 2021 4\' 11"  (1.499 m)     Head Circumference --      Peak Flow --      Pain Score 11/15/22 2021 10     Pain Loc --      Pain Edu? --      Excl. in [place]? --     Most recent vital signs: Vitals:   11/15/22 2022  BP: 108/84  Pulse: (!) 140  Resp: (!) 22  Temp: 98.7 F (37.1 C)  SpO2: 97%    General: Awake, no distress.  Occasional cough during exam. CV:  Good peripheral perfusion.  Regular rhythm rate around 120 bpm.  No obvious murmur Resp:  Normal effort.  Equal breath sounds bilaterally.  Abd:  No distention.  Soft, nontender.  No rebound or guarding.   ED Results / Procedures / Treatments    EKG  EKG viewed and interpreted by myself shows sinus tachycardia 145 bpm with a narrow QRS, normal axis, normal intervals, nonspecific ST changes without ST elevation.  RADIOLOGY  I have reviewed and interpreted the chest x-ray images.  No consolidation seen on my evaluation. Radiology is read the chest x-ray as negative.   MEDICATIONS ORDERED IN ED: Medications  sodium chloride 0.9 % bolus 1,000 mL (1,000 mLs Intravenous New Bag/Given 11/15/22 2135)  acetaminophen (TYLENOL) tablet 1,000 mg (1,000 mg Oral Given 11/15/22 2134)     IMPRESSION / MDM / ASSESSMENT AND PLAN / ED COURSE  I reviewed the triage vital signs and the nursing notes.  Patient's presentation is most consistent with acute presentation with potential threat to life or bodily function.  Patient presents emergency department for chest tightness and palpitations today.  Patient was diagnosed influenza approximately 1 week ago currently afebrile pulse rate however upon arrival is 140, 145 bpm on her EKG which appears to show some signs of demand ischemia.  Patient's lab work is overall reassuring including a normal CBC, negative troponin.  Patient's chemistry does show slight hyponatremia and  slightly low bicarb.  We will dose IV fluids treat with Tylenol and continue to closely monitor.  Already during my evaluation patient's heart rate is ranging between 110 and 120 prior to fluid and Tylenol administration.  Patient has no pleuritic pain.  Patient's chemistry is normal, CBC reassuring, troponin negative, chest x-ray is clear.  After fluids and Tylenol patient's pulse rate currently 104 bpm during my evaluation.  Patient states she is feeling better.  No pleuritic pain or other signs of PE, highly suspect this to be due to the patient's influenza I discussed continued supportive care including plenty of fluids and Tylenol every 6 hours as needed for fever/discomfort.  Recommended the patient stay away from over-the-counter  cough medications but did recommend Benadryl to be used at night if needed for sleep.  Patient agreeable to plan of care.  States she will call her OB/GYN tomorrow to inform them of today's ER visit and her current influenza infection.  I also discussed very strict return precautions for any return of chest pain shortness of breath or very fast heart rate.  Patient agreeable to plan.  FINAL CLINICAL IMPRESSION(S) / ED DIAGNOSES   Palpitations Tachycardia Influenza   Note:  This document was prepared using Dragon voice recognition software and may include unintentional dictation errors.   Harvest Dark, MD 11/15/22 2227

## 2022-11-15 NOTE — Discharge Instructions (Addendum)
As we discussed please drink plenty fluids, use Tylenol every 6 hours as needed for fever/discomfort.  He may use Benadryl as needed for sleep at night, as written on the box.  Please call your OB/GYN tomorrow to arrange follow-up as soon as possible and inform them of today's ER visit as well as your influenza infection.  Return to the emergency department for any chest pain, trouble breathing, significant or concerning heart rate or any other symptom personally concerning to yourself.

## 2022-11-15 NOTE — ED Triage Notes (Addendum)
Pt to ED via POV c/o flu. Pt states she tested positive for flu on Saturday. Pt states today she feels more fatigued, body aches, cough got worse. Pt also c/o chest pain x2days, feels like heaviness in center of chest. Pt denies any fevers. Pt in NAD at this time. Pt [redacted]wks pregnant

## 2022-12-04 ENCOUNTER — Ambulatory Visit (INDEPENDENT_AMBULATORY_CARE_PROVIDER_SITE_OTHER): Payer: Medicaid Other | Admitting: Obstetrics

## 2022-12-04 VITALS — BP 112/71 | HR 92 | Wt 170.0 lb

## 2022-12-04 DIAGNOSIS — O99210 Obesity complicating pregnancy, unspecified trimester: Secondary | ICD-10-CM

## 2022-12-04 DIAGNOSIS — Z3482 Encounter for supervision of other normal pregnancy, second trimester: Secondary | ICD-10-CM

## 2022-12-04 DIAGNOSIS — O09899 Supervision of other high risk pregnancies, unspecified trimester: Secondary | ICD-10-CM

## 2022-12-04 DIAGNOSIS — Z3A17 17 weeks gestation of pregnancy: Secondary | ICD-10-CM

## 2022-12-04 DIAGNOSIS — Z3480 Encounter for supervision of other normal pregnancy, unspecified trimester: Secondary | ICD-10-CM

## 2022-12-04 LAB — POCT URINALYSIS DIPSTICK OB
Bilirubin, UA: NEGATIVE
Blood, UA: NEGATIVE
Glucose, UA: NEGATIVE
Ketones, UA: NEGATIVE
Leukocytes, UA: NEGATIVE
Nitrite, UA: NEGATIVE
Spec Grav, UA: 1.015 (ref 1.010–1.025)
Urobilinogen, UA: 0.2 E.U./dL
pH, UA: 7 (ref 5.0–8.0)

## 2022-12-04 NOTE — Progress Notes (Signed)
Routine Prenatal Care Visit  Subjective  Sandra Wilkerson is a 27 y.o. G2P1001 at 66w0dbeing seen today for ongoing prenatal care.  She is currently monitored for the following issues for this high-risk pregnancy and has Asthma; Crohn's colitis ([place]; Keloid scar; Other headache syndrome; Status [place] ([place]; Peritoneal abscess ([place]; Chronic bilateral thoracic back pain; History of infertility; Obesity (BMI 30-39.9); PCO (polycystic ovaries); Choledocholithiasis; Anemia; Chest pain; Maternal Crohn's disease affecting pregnancy in second trimester ([place]; Esophageal reflux; Neck pain; Symptomatic mammary hypertrophy; Supervision of other normal pregnancy, antepartum; Rh negative state in antepartum period; Supervision of other high risk pregnancy, antepartum, unspecified trimester; Obesity in pregnancy; and Previous cesarean delivery affecting pregnancy on their problem list.  ----------------------------------------------------------------------------------- Patient reports no complaints.   Contractions: Not present. Vag. Bleeding: None.  Movement: Present. Leaking Fluid denies.  ----------------------------------------------------------------------------------- The following portions of the patient's history were reviewed and updated as appropriate: allergies, current medications, past family history, past medical history, past social history, past surgical history and problem list. Problem list updated.  Objective  Blood pressure 112/71, pulse 92, weight 170 lb (77.1 kg), last menstrual period 08/07/2022, not currently breastfeeding. Pregravid weight 170 lb (77.1 kg) Total Weight Gain 0 lb (0 kg) Urinalysis: Urine Protein Trace  Urine Glucose Negative  Fetal Status:     Movement: Present     General:  Alert, oriented and cooperative. Patient is in no acute distress.  Skin: Skin is warm and dry. No rash noted.   Cardiovascular: Normal heart rate noted  Respiratory: Normal respiratory  effort, no problems with respiration noted  Abdomen: Soft, gravid, appropriate for gestational age. Pain/Pressure: Absent     Pelvic:  Cervical exam deferred        Extremities: Normal range of motion.     Mental Status: Normal mood and affect. Normal behavior. Normal judgment and thought content.   Assessment   27y.o. G2P1001 at 172w0dy  05/14/2023, by Last Menstrual Period presenting for routine prenatal visit [place]ed [place]  Plan   second  Problems (from 10/05/22 to present)     Problem Noted Resolved   Rh negative state in antepartum period 10/30/2022 by DoEmily FilbertMD No   Supervision of other high risk pregnancy, antepartum, unspecified trimester 10/30/2022 by DoEmily FilbertMD No   Overview Addendum 12/04/2022  1:57 PM by FrImagene RichesCNM     Nursing Staff Provider  Office Location  [facility] Dating    Language  English Anatomy [place]dered.  Flu Vaccine   Genetic Screen  NIPS: negative, female  TDaP vaccine    Hgb A1C or  GTT Early : Third trimester :   Covid    LAB RESULTS   Rhogam   Blood Type O/Negative/-- (01/18 1552)   Feeding Plan  Antibody Negative (01/18 1552)  Contraception  Rubella 10.70 (01/18 1552)  Circumcision  RPR Non Reactive (01/18 1552)   Pediatrician   HBsAg Negative (01/18 1552)   Support Person  HIV Non Reactive (01/18 1552)  Prenatal Classes  Varicella     GBS  (For PCN allergy, check sensitivities)   BTL Consent     VBAC Consent  Pap      Hgb Electro      CF      SMA               Supervision of other normal pregnancy, antepartum 10/05/2022 by JoCleophas DunkerCMA No   Overview Addendum 10/05/2022  2:50 PM by Cleophas Dunker, [place] Staff Provider  Office Location  [facility] Dating  Not found.  Language  English Anatomy US    Flu Vaccine  offer Genetic Screen  NIPS:   TDaP vaccine   offer Hgb A1C or  GTT Early : Third trimester :   Covid declined   LAB RESULTS   Rhogam     Blood Type     Feeding Plan  formula Antibody    Contraception undecided Rubella    Circumcision yes RPR     Pediatrician  [facility] HBsAg     Support Person Linton Rump HIV    Prenatal Classes no Varicella     GBS  (For PCN allergy, check sensitivities)   BTL Consent  Hep C     VBAC Consent  Pap Diagnosis  Date Value Ref Range Status  12/29/2020   Final   - Negative for intraepithelial lesion or malignancy (NILM)      Hgb Electro      CF      SMA                   Preterm labor symptoms and general obstetric precautions including but not limited to vaginal bleeding, contractions, leaking of fluid and fetal movement were reviewed in detail with the patient. Please refer to After Visit Summary for other counseling recommendations.  Encouraged to learn more about labor and birth via video, books, online.will focus on appropriate wt gain this pregnancy Her anatomy scan has been scheduled.  No follow-ups on file.  Imagene Riches, CNM  12/04/2022 1:57 PM

## 2022-12-05 ENCOUNTER — Telehealth: Payer: Self-pay

## 2022-12-05 NOTE — Telephone Encounter (Signed)
Patient needs to be rescheduled to a Monday or Wednesday

## 2022-12-13 ENCOUNTER — Other Ambulatory Visit: Payer: Self-pay

## 2022-12-13 ENCOUNTER — Ambulatory Visit: Payer: Medicaid Other | Attending: Obstetrics

## 2022-12-13 VITALS — BP 119/75 | HR 83 | Temp 98.2°F | Ht 59.0 in | Wt 171.5 lb

## 2022-12-13 DIAGNOSIS — O99612 Diseases of the digestive system complicating pregnancy, second trimester: Secondary | ICD-10-CM | POA: Insufficient documentation

## 2022-12-13 DIAGNOSIS — Z3689 Encounter for other specified antenatal screening: Secondary | ICD-10-CM | POA: Insufficient documentation

## 2022-12-13 DIAGNOSIS — O99212 Obesity complicating pregnancy, second trimester: Secondary | ICD-10-CM | POA: Insufficient documentation

## 2022-12-13 DIAGNOSIS — K5090 Crohn's disease, unspecified, without complications: Secondary | ICD-10-CM | POA: Insufficient documentation

## 2022-12-13 DIAGNOSIS — Z3A18 18 weeks gestation of pregnancy: Secondary | ICD-10-CM | POA: Insufficient documentation

## 2022-12-13 DIAGNOSIS — E669 Obesity, unspecified: Secondary | ICD-10-CM | POA: Diagnosis not present

## 2022-12-13 DIAGNOSIS — O0992 Supervision of high risk pregnancy, unspecified, second trimester: Secondary | ICD-10-CM | POA: Insufficient documentation

## 2022-12-13 DIAGNOSIS — O34219 Maternal care for unspecified type scar from previous cesarean delivery: Secondary | ICD-10-CM

## 2022-12-13 DIAGNOSIS — O26899 Other specified pregnancy related conditions, unspecified trimester: Secondary | ICD-10-CM

## 2022-12-13 DIAGNOSIS — Z3480 Encounter for supervision of other normal pregnancy, unspecified trimester: Secondary | ICD-10-CM

## 2022-12-13 DIAGNOSIS — O09899 Supervision of other high risk pregnancies, unspecified trimester: Secondary | ICD-10-CM

## 2022-12-13 NOTE — Progress Notes (Signed)
  Maternal Fetal Medicine Patient Intake Form Patient information  Patient Name: Sandra Wilkerson  Patient MRN:   [id number]  Referring practice: MFM Referring Provider: [facility]  Medical/Obstetric History   Past pregnancies OB History  Gravida Para Term Preterm AB Living  '2 1 1 '$ 0 0 1  SAB IAB Ectopic Multiple Live Births  0 0 0 0 1    # Outcome Date GA Lbr Len/2nd Weight Sex Delivery Anes PTL Lv  2 Current           1 Term 07/15/21 [redacted]w[redacted]d 3590 g M CS-LTranv EPI  LIV     Dating method LMP: Patient's last menstrual period was 08/07/2022 (exact date).  Approximate. Menstrual cycles: Regular every 28 days. EDD: Estimated Date of Delivery: 05/14/23 based on Reliable LMP.   Ultrasound indications Ultrasound Indication: Obesity, Hx:C-Sec, [id number] Pre-gravid BMI: 170      Prenatal Labs   Type/screen NIPT AFP Carrier/Horizon  Rh status: O/Negative/-- (01/18 1552) Rh-  Ab screen:  Negative (01/18 1552)  O-   Mat 21(neg)  Fetal [id number] done Not performed      Other Labs   Glucose Other  1/2h GCT:  Not performed yet  3h GTT:   , Pass, Fail, 1h:  , 2h: , 3h: , and n/a     Imaging (Date and Location)   Maternal echo Not Performed    EKG Not Performed  Fetal echo Not Performed  Last fetal growth:  -Estimated fetal weight: ______ percentile -Abdominal circumference: ______ percentile -Dopplers: '[ ]'$  Normal '[ ]'$  Elevated '[ ]'$  Other: ____________  -Other: ____________     Allergies   Allergies  Allergen Reactions   Other Anaphylaxis    Sodium Ferric Gluconate     Medications   Current Outpatient Medications on File Prior to Visit  Medication Sig Dispense Refill   acetaminophen (TYLENOL) 325 MG tablet Take 2 tablets (650 mg total) by mouth every 6 (six) hours as needed for mild pain (or Fever >/= 101).     inFLIXimab (REMICADE IV) Inject 10 mg PE/kg into the vein every 28 (twenty-eight) days.     Prenat-FeAsp-Meth-FA-DHA w/o A (PRENATE PIXIE)  10-0.6-0.4-200 MG CAPS Take 1 capsule by mouth daily. 30 capsule 11   aspirin 81 MG chewable tablet Chew 1 tablet (81 mg total) by mouth daily. (Patient not taking: Reported on 12/13/2022) 90 tablet 3   cholestyramine (QUESTRAN) 4 g packet Take by mouth. (Patient not taking: Reported on 12/13/2022)     No current facility-administered medications on file prior to visit.     Prenatal flow sheet   Date GA Fetal Movement Contractions Vaginal bleeding Loss of fluid Vital signs   12/13/2022  169w2dot yet No No No Vitals:   12/13/22 0805  BP: 119/75  Pulse: 83  Temp: 98.2 F (36.8 C)  TempSrc: Oral  SpO2: 97%  Weight: 77.8 kg  Height: '4\' 11"'$  (1.499 m)   Body mass index is 34.64 kg/m.    Other Patient Concerns   Other patient concerns: Pt wants a VBAC this pregnancy   Future Appointments   Future Appointments  Date Time Provider [place]3/26/2024  2:00 PM EvHarlin HeysMD AOB-AOB None    BuValeda MalmDO

## 2022-12-14 ENCOUNTER — Ambulatory Visit: Payer: Medicaid Other

## 2022-12-29 ENCOUNTER — Telehealth: Payer: Self-pay

## 2022-12-29 NOTE — Telephone Encounter (Signed)
Patient called with concerns about cramping overnight and slightly this morning. She denies bleeding. I advised her to take some Tylenol to see if it helps. Drink some water to make sure she is not dehydrated. I also explained to her that it could be round ligament pain as well. I advised her that if the Tylenol did not help and cramping became severe to go to ED. She verbalized understanding.

## 2023-01-02 ENCOUNTER — Encounter: Payer: Self-pay | Admitting: Obstetrics and Gynecology

## 2023-01-02 ENCOUNTER — Ambulatory Visit (INDEPENDENT_AMBULATORY_CARE_PROVIDER_SITE_OTHER): Payer: Medicaid Other | Admitting: Obstetrics and Gynecology

## 2023-01-02 VITALS — BP 116/77 | HR 106 | Wt 175.6 lb

## 2023-01-02 DIAGNOSIS — Z3A21 21 weeks gestation of pregnancy: Secondary | ICD-10-CM

## 2023-01-02 DIAGNOSIS — O09292 Supervision of pregnancy with other poor reproductive or obstetric history, second trimester: Secondary | ICD-10-CM

## 2023-01-02 DIAGNOSIS — O09899 Supervision of other high risk pregnancies, unspecified trimester: Secondary | ICD-10-CM

## 2023-01-02 DIAGNOSIS — Z3480 Encounter for supervision of other normal pregnancy, unspecified trimester: Secondary | ICD-10-CM

## 2023-01-02 DIAGNOSIS — Z6791 Unspecified blood type, Rh negative: Secondary | ICD-10-CM

## 2023-01-02 LAB — POCT URINALYSIS DIPSTICK OB
Bilirubin, UA: NEGATIVE
Blood, UA: NEGATIVE
Glucose, UA: NEGATIVE
Ketones, UA: NEGATIVE
Leukocytes, UA: NEGATIVE
Nitrite, UA: NEGATIVE
Spec Grav, UA: 1.01
Urobilinogen, UA: 0.2 U/dL
pH, UA: 7

## 2023-01-02 NOTE — Progress Notes (Signed)
ROB: Not feeling the baby move is much as she would like.  (Possibly anterior placenta issue).  Taking vitamins as directed.  Considering TOLAC vs CD.  All questions answered today.  Patient would like some more time to consider.  History of Crohn's disease and abdominal surgery.  Follow-up ultrasound scheduled in a few weeks for growth because of her Crohn's.

## 2023-01-02 NOTE — Progress Notes (Signed)
ROB. Patient states she has not felt much fetal movement and she is anxious about it. Follow-up ultrasound 4/17 for growth due to Crohn's. Patient states no questions or concerns at this time.

## 2023-01-22 ENCOUNTER — Other Ambulatory Visit: Payer: Self-pay

## 2023-01-22 DIAGNOSIS — O34219 Maternal care for unspecified type scar from previous cesarean delivery: Secondary | ICD-10-CM

## 2023-01-22 DIAGNOSIS — O99210 Obesity complicating pregnancy, unspecified trimester: Secondary | ICD-10-CM

## 2023-01-22 DIAGNOSIS — K50119 Crohn's disease of large intestine with unspecified complications: Secondary | ICD-10-CM

## 2023-01-22 DIAGNOSIS — K5090 Crohn's disease, unspecified, without complications: Secondary | ICD-10-CM

## 2023-01-24 ENCOUNTER — Ambulatory Visit: Payer: Medicaid Other | Attending: Obstetrics

## 2023-01-24 ENCOUNTER — Other Ambulatory Visit: Payer: Self-pay

## 2023-01-24 VITALS — BP 118/64 | HR 98 | Temp 98.8°F | Ht 59.0 in | Wt 174.0 lb

## 2023-01-24 DIAGNOSIS — E669 Obesity, unspecified: Secondary | ICD-10-CM

## 2023-01-24 DIAGNOSIS — Z362 Encounter for other antenatal screening follow-up: Secondary | ICD-10-CM | POA: Insufficient documentation

## 2023-01-24 DIAGNOSIS — O99612 Diseases of the digestive system complicating pregnancy, second trimester: Secondary | ICD-10-CM

## 2023-01-24 DIAGNOSIS — O09899 Supervision of other high risk pregnancies, unspecified trimester: Secondary | ICD-10-CM

## 2023-01-24 DIAGNOSIS — Z3A24 24 weeks gestation of pregnancy: Secondary | ICD-10-CM | POA: Insufficient documentation

## 2023-01-24 DIAGNOSIS — O99212 Obesity complicating pregnancy, second trimester: Secondary | ICD-10-CM | POA: Insufficient documentation

## 2023-01-24 DIAGNOSIS — O34219 Maternal care for unspecified type scar from previous cesarean delivery: Secondary | ICD-10-CM | POA: Insufficient documentation

## 2023-01-24 DIAGNOSIS — O99210 Obesity complicating pregnancy, unspecified trimester: Secondary | ICD-10-CM

## 2023-01-24 DIAGNOSIS — Z6791 Unspecified blood type, Rh negative: Secondary | ICD-10-CM

## 2023-01-24 DIAGNOSIS — K5090 Crohn's disease, unspecified, without complications: Secondary | ICD-10-CM

## 2023-01-24 DIAGNOSIS — Z3480 Encounter for supervision of other normal pregnancy, unspecified trimester: Secondary | ICD-10-CM

## 2023-01-30 ENCOUNTER — Ambulatory Visit (INDEPENDENT_AMBULATORY_CARE_PROVIDER_SITE_OTHER): Payer: Medicaid Other | Admitting: Licensed Practical Nurse

## 2023-01-30 ENCOUNTER — Encounter: Payer: Self-pay | Admitting: Licensed Practical Nurse

## 2023-01-30 VITALS — BP 126/71 | HR 105 | Wt 174.5 lb

## 2023-01-30 DIAGNOSIS — Z3A25 25 weeks gestation of pregnancy: Secondary | ICD-10-CM

## 2023-01-30 DIAGNOSIS — O09899 Supervision of other high risk pregnancies, unspecified trimester: Secondary | ICD-10-CM

## 2023-01-30 DIAGNOSIS — Z131 Encounter for screening for diabetes mellitus: Secondary | ICD-10-CM

## 2023-01-30 DIAGNOSIS — O09892 Supervision of other high risk pregnancies, second trimester: Secondary | ICD-10-CM

## 2023-01-30 LAB — POCT URINALYSIS DIPSTICK
Bilirubin, UA: NEGATIVE
Blood, UA: NEGATIVE
Glucose, UA: NEGATIVE
Ketones, UA: NEGATIVE
Leukocytes, UA: NEGATIVE
Nitrite, UA: NEGATIVE
Protein, UA: NEGATIVE
Spec Grav, UA: 1.01 (ref 1.010–1.025)
Urobilinogen, UA: 0.2 E.U./dL
pH, UA: 7 (ref 5.0–8.0)

## 2023-01-30 NOTE — Progress Notes (Signed)
Routine Prenatal Care Visit  Subjective  Sandra Wilkerson is a 27 y.o. G2P1001 at [redacted]w[redacted]d being seen today for ongoing prenatal care.  She is currently monitored for the following issues for this high-risk pregnancy and has Asthma; Crohn's colitis; Keloid scar; Other headache syndrome; Status post colostomy; Peritoneal abscess; Chronic bilateral thoracic back pain; History of infertility; Obesity (BMI 30-39.9); PCO (polycystic ovaries); Choledocholithiasis; Anemia; Chest pain; Maternal Crohn's disease affecting pregnancy in second trimester; Esophageal reflux; Neck pain; Symptomatic mammary hypertrophy; Supervision of other normal pregnancy, antepartum; Rh negative state in antepartum period; Supervision of other high risk pregnancy, antepartum, unspecified trimester; Obesity in pregnancy; and Previous cesarean delivery affecting pregnancy on their problem list.  ----------------------------------------------------------------------------------- Patient reports has had a sharp pain in the middle of her adb since 430 am, it woke her from her sleep, it comes and goes, she has not had appetite today, she does have some nausea, she has had loose stools since yesterday, denies fever. She does not  have an appendix. Denies fevers. Her abd is soft and non tender today. Pt wonders if this could be related to Crohns. Aware to go to the ED if the pain worsens, causes her to vomit or wakes her form her sleep.  -reviewed GDM screening, TDAP  and Rhogam at next visit.  -undecided about TOLAC versus repeat c/s, reviewed risks and benefits, We will support either choice she makes.   Contractions: Irritability. Vag. Bleeding: None.  Movement: Present. Leaking Fluid denies.  ----------------------------------------------------------------------------------- The following portions of the patient's history were reviewed and updated as appropriate: allergies, current medications, past family history, past medical history,  past social history, past surgical history and problem list. Problem list updated.  Objective  Blood pressure 126/71, pulse (!) 105, weight 174 lb 8 oz (79.2 kg), last menstrual period 08/07/2022, not currently breastfeeding. Pregravid weight 170 lb (77.1 kg) Total Weight Gain 4 lb 8 oz (2.041 kg) Urinalysis: Urine Protein    Urine Glucose    Fetal Status: Fetal Heart Rate (bpm): 147 Fundal Height: 28 cm Movement: Present     General:  Alert, oriented and cooperative. Patient is in no acute distress.  Skin: Skin is warm and dry. No rash noted.   Cardiovascular: Normal heart rate noted  Respiratory: Normal respiratory effort, no problems with respiration noted  Abdomen: Soft, gravid, appropriate for gestational age. Pain/Pressure: Absent     Pelvic:  Cervical exam deferred        Extremities: Normal range of motion.     Mental Status: Normal mood and affect. Normal behavior. Normal judgment and thought content.   Assessment   27 y.o. G2P1001 at [redacted]w[redacted]d by  05/14/2023, by Last Menstrual Period presenting for routine prenatal visit  Plan   second  Problems (from 10/05/22 to present)     Problem Noted Resolved   Rh negative state in antepartum period 10/30/2022 by Allie Bossier, MD No   Supervision of other high risk pregnancy, antepartum, unspecified trimester 10/30/2022 by Allie Bossier, MD No   Overview Addendum 12/04/2022  1:57 PM by Mirna Mires, CNM     Nursing Staff Provider  Office Location  [facility] Dating    Language  English Anatomy US  Ordered.  Flu Vaccine   Genetic Screen  NIPS: negative, female  TDaP vaccine    Hgb A1C or  GTT Early : Third trimester :   Covid    LAB RESULTS   Rhogam   Blood Type O/Negative/-- (01/18 1552)  Feeding Plan  Antibody Negative (01/18 1552)  Contraception  Rubella 10.70 (01/18 1552)  Circumcision  RPR Non Reactive (01/18 1552)   Pediatrician   HBsAg Negative (01/18 1552)   Support Person  HIV Non Reactive (01/18 1552)  Prenatal Classes   Varicella     GBS  (For PCN allergy, check sensitivities)   BTL Consent     VBAC Consent  Pap      Hgb Electro      CF      SMA               Supervision of other normal pregnancy, antepartum 10/05/2022 by Loran Senters, CMA No   Overview Addendum 10/05/2022  2:50 PM by Loran Senters, CMA     Clinical Staff Provider  Office Location  [facility] Dating  Not found.  Language  English Anatomy US    Flu Vaccine  offer Genetic Screen  NIPS:   TDaP vaccine   offer Hgb A1C or  GTT Early : Third trimester :   Covid declined   LAB RESULTS   Rhogam     Blood Type     Feeding Plan formula Antibody    Contraception undecided Rubella    Circumcision yes RPR     Pediatrician  [facility] HBsAg     Support Person Everlean Alstrom HIV    Prenatal Classes no Varicella     GBS  (For PCN allergy, check sensitivities)   BTL Consent  Hep C     VBAC Consent  Pap Diagnosis  Date Value Ref Range Status  12/29/2020   Final   - Negative for intraepithelial lesion or malignancy (NILM)      Hgb Electro      CF      SMA                   Preterm labor symptoms and general obstetric precautions including but not limited to vaginal bleeding, contractions, leaking of fluid and fetal movement were reviewed in detail with the patient. Please refer to After Visit Summary for other counseling recommendations.   Return in about 3 weeks (around 02/20/2023) for ROB, 28 wks labs, rhogam .  Carie Caddy, CNM  [facility]  02/01/23  2:37 PM

## 2023-02-21 ENCOUNTER — Ambulatory Visit (INDEPENDENT_AMBULATORY_CARE_PROVIDER_SITE_OTHER): Payer: Medicaid Other | Admitting: Obstetrics

## 2023-02-21 ENCOUNTER — Other Ambulatory Visit: Payer: Medicaid Other

## 2023-02-21 ENCOUNTER — Encounter: Payer: Self-pay | Admitting: Obstetrics

## 2023-02-21 VITALS — BP 114/77 | HR 99 | Wt 175.0 lb

## 2023-02-21 DIAGNOSIS — O99210 Obesity complicating pregnancy, unspecified trimester: Secondary | ICD-10-CM

## 2023-02-21 DIAGNOSIS — O360130 Maternal care for anti-D [Rh] antibodies, third trimester, not applicable or unspecified: Secondary | ICD-10-CM | POA: Diagnosis not present

## 2023-02-21 DIAGNOSIS — Z3480 Encounter for supervision of other normal pregnancy, unspecified trimester: Secondary | ICD-10-CM

## 2023-02-21 DIAGNOSIS — Z131 Encounter for screening for diabetes mellitus: Secondary | ICD-10-CM

## 2023-02-21 DIAGNOSIS — O09899 Supervision of other high risk pregnancies, unspecified trimester: Secondary | ICD-10-CM

## 2023-02-21 DIAGNOSIS — Z3A28 28 weeks gestation of pregnancy: Secondary | ICD-10-CM

## 2023-02-21 DIAGNOSIS — Z2913 Encounter for prophylactic Rho(D) immune globulin: Secondary | ICD-10-CM

## 2023-02-21 DIAGNOSIS — Z23 Encounter for immunization: Secondary | ICD-10-CM

## 2023-02-21 MED ORDER — RHO D IMMUNE GLOBULIN 1500 UNIT/2ML IJ SOSY
300.0000 ug | PREFILLED_SYRINGE | Freq: Once | INTRAMUSCULAR | Status: AC
  Administered 2023-02-21: 300 ug via INTRAMUSCULAR

## 2023-02-21 NOTE — Progress Notes (Addendum)
ROB at [redacted]w[redacted]d. Active baby. Denies cramping, LOF, and vaginal bleeding. Gwynneth is undecided about CS vs TOLAC - answered questions about scarring (she has keloid from prior CS incison) and uterine rupture, risks and benefits. Would like to see Dr. Logan Bores for next visit since he did her prior CS. Undecided about contraception. Considering pills or Nexplanon. Contraception handout and birth plan handout given. 1- hour glucose, CBC, and RPR today. Rhogam given. Declines TDaP at this visit. RTC in 2 weeks.  Glenetta Borg, CNM

## 2023-02-22 ENCOUNTER — Encounter: Payer: Self-pay | Admitting: Obstetrics and Gynecology

## 2023-02-22 LAB — 28 WEEKS RH-PANEL
Antibody Screen: NEGATIVE
Basophils Absolute: 0 10*3/uL (ref 0.0–0.2)
Basos: 0 %
EOS (ABSOLUTE): 0.2 10*3/uL (ref 0.0–0.4)
Eos: 2 %
Gestational Diabetes Screen: 123 mg/dL (ref 70–139)
HIV Screen 4th Generation wRfx: NONREACTIVE
Hematocrit: 30.9 % — ABNORMAL LOW (ref 34.0–46.6)
Hemoglobin: 9.4 g/dL — ABNORMAL LOW (ref 11.1–15.9)
Immature Grans (Abs): 0 10*3/uL (ref 0.0–0.1)
Immature Granulocytes: 0 %
Lymphocytes Absolute: 3 10*3/uL (ref 0.7–3.1)
Lymphs: 30 %
MCH: 22.7 pg — ABNORMAL LOW (ref 26.6–33.0)
MCHC: 30.4 g/dL — ABNORMAL LOW (ref 31.5–35.7)
MCV: 75 fL — ABNORMAL LOW (ref 79–97)
Monocytes Absolute: 0.5 10*3/uL (ref 0.1–0.9)
Monocytes: 5 %
Neutrophils Absolute: 6.2 10*3/uL (ref 1.4–7.0)
Neutrophils: 63 %
Platelets: 325 10*3/uL (ref 150–450)
RBC: 4.14 x10E6/uL (ref 3.77–5.28)
RDW: 15.2 % (ref 11.7–15.4)
RPR Ser Ql: NONREACTIVE
WBC: 10 10*3/uL (ref 3.4–10.8)

## 2023-02-22 NOTE — Telephone Encounter (Signed)
Spoke with patent read back pain protocol to her, she stated it did not hurt above or below injection site ,stated her back pain  Started at night. No other symptom let her know to stay hydrated as well and [place] heating pad on back not abdomin.let us know if sx worsen.

## 2023-02-25 ENCOUNTER — Other Ambulatory Visit: Payer: Self-pay | Admitting: Licensed Practical Nurse

## 2023-02-25 DIAGNOSIS — O99013 Anemia complicating pregnancy, third trimester: Secondary | ICD-10-CM

## 2023-02-25 MED ORDER — FERROUS SULFATE 325 (65 FE) MG PO TBEC: 325.0000 mg | DELAYED_RELEASE_TABLET | Freq: Two times a day (BID) | ORAL | 3 refills | Status: DC

## 2023-02-25 NOTE — Progress Notes (Signed)
Pt's hgb 9.4, script for iron sent to pharmacy. Pt notified via mychart.  Carie Caddy, CNM  [facility]  02/25/23  10:07 AM

## 2023-02-28 ENCOUNTER — Other Ambulatory Visit: Payer: Self-pay

## 2023-02-28 ENCOUNTER — Encounter: Payer: Self-pay | Admitting: Licensed Practical Nurse

## 2023-02-28 DIAGNOSIS — O99210 Obesity complicating pregnancy, unspecified trimester: Secondary | ICD-10-CM

## 2023-02-28 DIAGNOSIS — K5090 Crohn's disease, unspecified, without complications: Secondary | ICD-10-CM

## 2023-02-28 DIAGNOSIS — O34219 Maternal care for unspecified type scar from previous cesarean delivery: Secondary | ICD-10-CM

## 2023-03-07 ENCOUNTER — Ambulatory Visit (INDEPENDENT_AMBULATORY_CARE_PROVIDER_SITE_OTHER): Payer: Medicaid Other | Admitting: Obstetrics and Gynecology

## 2023-03-07 ENCOUNTER — Other Ambulatory Visit: Payer: Self-pay

## 2023-03-07 ENCOUNTER — Encounter: Payer: Self-pay | Admitting: Obstetrics and Gynecology

## 2023-03-07 ENCOUNTER — Ambulatory Visit: Payer: Medicaid Other | Attending: Obstetrics and Gynecology

## 2023-03-07 VITALS — BP 109/72 | HR 99 | Wt 174.0 lb

## 2023-03-07 DIAGNOSIS — O99213 Obesity complicating pregnancy, third trimester: Secondary | ICD-10-CM | POA: Diagnosis not present

## 2023-03-07 DIAGNOSIS — Z362 Encounter for other antenatal screening follow-up: Secondary | ICD-10-CM | POA: Insufficient documentation

## 2023-03-07 DIAGNOSIS — K5090 Crohn's disease, unspecified, without complications: Secondary | ICD-10-CM | POA: Insufficient documentation

## 2023-03-07 DIAGNOSIS — Z79899 Other long term (current) drug therapy: Secondary | ICD-10-CM | POA: Diagnosis not present

## 2023-03-07 DIAGNOSIS — E669 Obesity, unspecified: Secondary | ICD-10-CM

## 2023-03-07 DIAGNOSIS — O34219 Maternal care for unspecified type scar from previous cesarean delivery: Secondary | ICD-10-CM | POA: Diagnosis not present

## 2023-03-07 DIAGNOSIS — O0993 Supervision of high risk pregnancy, unspecified, third trimester: Secondary | ICD-10-CM

## 2023-03-07 DIAGNOSIS — Z23 Encounter for immunization: Secondary | ICD-10-CM

## 2023-03-07 DIAGNOSIS — O403XX0 Polyhydramnios, third trimester, not applicable or unspecified: Secondary | ICD-10-CM | POA: Diagnosis not present

## 2023-03-07 DIAGNOSIS — O99613 Diseases of the digestive system complicating pregnancy, third trimester: Secondary | ICD-10-CM | POA: Diagnosis not present

## 2023-03-07 DIAGNOSIS — Z3A30 30 weeks gestation of pregnancy: Secondary | ICD-10-CM | POA: Insufficient documentation

## 2023-03-07 DIAGNOSIS — O09899 Supervision of other high risk pregnancies, unspecified trimester: Secondary | ICD-10-CM

## 2023-03-07 DIAGNOSIS — O99210 Obesity complicating pregnancy, unspecified trimester: Secondary | ICD-10-CM

## 2023-03-07 LAB — POCT URINALYSIS DIPSTICK OB
Bilirubin, UA: NEGATIVE
Blood, UA: NEGATIVE
Glucose, UA: NEGATIVE
Ketones, UA: NEGATIVE
Nitrite, UA: NEGATIVE
Spec Grav, UA: 1.015 (ref 1.010–1.025)
Urobilinogen, UA: 0.2 E.U./dL
pH, UA: 6 (ref 5.0–8.0)

## 2023-03-07 NOTE — Progress Notes (Signed)
ROB. Patient states daily fetal movement. TDAP administered today. She would like to discuss repeat C/S today. MFM growth ultrasound this afternoon at 3 pm.

## 2023-03-07 NOTE — Progress Notes (Signed)
ROB: Denies problems.  Reports the baby is very active.  Has decided that she would like a repeat cesarean delivery.  I scheduled this for July 29.

## 2023-03-11 ENCOUNTER — Observation Stay
Admission: EM | Admit: 2023-03-11 | Discharge: 2023-03-11 | Disposition: A | Discharge status: Home / Self Care | Payer: Medicaid Other | Attending: Obstetrics | Admitting: Obstetrics

## 2023-03-11 ENCOUNTER — Encounter: Payer: Self-pay | Admitting: Obstetrics

## 2023-03-11 ENCOUNTER — Other Ambulatory Visit: Payer: Self-pay

## 2023-03-11 DIAGNOSIS — Z3A30 30 weeks gestation of pregnancy: Secondary | ICD-10-CM | POA: Diagnosis not present

## 2023-03-11 DIAGNOSIS — K5900 Constipation, unspecified: Secondary | ICD-10-CM | POA: Diagnosis not present

## 2023-03-11 DIAGNOSIS — Z3480 Encounter for supervision of other normal pregnancy, unspecified trimester: Secondary | ICD-10-CM

## 2023-03-11 DIAGNOSIS — O26899 Other specified pregnancy related conditions, unspecified trimester: Secondary | ICD-10-CM

## 2023-03-11 DIAGNOSIS — K5090 Crohn's disease, unspecified, without complications: Secondary | ICD-10-CM

## 2023-03-11 DIAGNOSIS — R109 Unspecified abdominal pain: Secondary | ICD-10-CM | POA: Insufficient documentation

## 2023-03-11 DIAGNOSIS — O26893 Other specified pregnancy related conditions, third trimester: Secondary | ICD-10-CM | POA: Diagnosis present

## 2023-03-11 DIAGNOSIS — O99613 Diseases of the digestive system complicating pregnancy, third trimester: Secondary | ICD-10-CM | POA: Diagnosis not present

## 2023-03-11 DIAGNOSIS — R197 Diarrhea, unspecified: Secondary | ICD-10-CM | POA: Diagnosis not present

## 2023-03-11 DIAGNOSIS — O09899 Supervision of other high risk pregnancies, unspecified trimester: Secondary | ICD-10-CM

## 2023-03-11 MED ORDER — ACETAMINOPHEN 325 MG PO TABS: 650.0000 mg | ORAL_TABLET | ORAL | Status: DC | PRN

## 2023-03-11 MED ORDER — LACTATED RINGERS IV SOLN: 500.0000 mL | INTRAVENOUS | Status: DC | PRN

## 2023-03-11 MED ORDER — ONDANSETRON HCL 4 MG/2ML IJ SOLN: 4.0000 mg | Freq: Four times a day (QID) | INTRAMUSCULAR | Status: DC | PRN

## 2023-03-11 MED ORDER — SOD CITRATE-CITRIC ACID 500-334 MG/5ML PO SOLN: 30.0000 mL | ORAL | Status: DC | PRN

## 2023-03-11 MED ORDER — HYDROXYZINE HCL 25 MG PO TABS: 50.0000 mg | ORAL_TABLET | Freq: Four times a day (QID) | ORAL | Status: DC | PRN

## 2023-03-11 MED ORDER — LACTATED RINGERS IV BOLUS
1000.0000 mL | Freq: Once | INTRAVENOUS | Status: AC
  Administered 2023-03-11: 1000 mL via INTRAVENOUS

## 2023-03-11 NOTE — OB Triage Note (Signed)
LABOR & DELIVERY OB TRIAGE NOTE  SUBJECTIVE  HPI Sandra Wilkerson is a 27 y.o. G2P1001 at [redacted]w[redacted]d who presents to Labor & Delivery for diarrhea and cramping. She has a h/o Crohns and reports that she was constipated and took several doses of Miralax. She has had diarrhea since Saturday and has vomited one time. She has been able to eat a little bit of food but not much. She also had frequent uterine cramping but reports that this has improved since arriving in triage. She denies LOF and vaginal bleeding. She reports she had decreased fetal movement earlier. She received a liter of fluid in triage and is feeling well.  OB History     Gravida  2   Para  1   Term  1   Preterm  0   AB  0   Living  1      SAB  0   IAB  0   Ectopic  0   Multiple  0   Live Births  1           Scheduled Meds: Continuous Infusions:  lactated ringers     PRN Meds:.acetaminophen, hydrOXYzine, lactated ringers, ondansetron, sodium citrate-citric acid  OBJECTIVE  BP 119/76 (BP Location: Right Arm)   Pulse 99   Temp 99 F (37.2 C) (Oral)   Resp 16   Ht 4\' 11"  (1.499 m)   Wt 78.9 kg   LMP 08/07/2022 (Exact Date)   BMI 35.14 kg/m   General: alert, cooperative, NAD Heart: RRR Lungs: CTAB Abdomen: soft, non-tender, gravid  NST I reviewed the NST and it was reactive.  Baseline: 140 Variability: moderate Accelerations: present Decelerations:none Toco: irritability Category 1  ASSESSMENT Impression  1) Pregnancy at G2P1001, [redacted]w[redacted]d, Estimated Date of Delivery: 05/14/23 2) Reassuring maternal/fetal status 3) Diarrhea and uterine irritability - improved after IV fluids  PLAN 1) Discharge home with return/labor precautions 2) Encouraged bulking agent (Metamucil), BRAT diet, slowly advancing diet, Imodium if diarrhea worsens. 3) Keep scheduled ROB visits   Guadlupe Spanish, CNM 03/11/23  8:18 PM

## 2023-03-11 NOTE — OB Triage Note (Signed)
Pt co having constipation, took 3 doses of Miralax. Now having diarrhea. Co abdominal pain. Denies LOF or vaginal bleeding. Also co decrease in fetal movement. Monitors applied, fetal movement visualized and audile. Elaina Hoops

## 2023-03-14 ENCOUNTER — Encounter: Payer: Self-pay | Admitting: Obstetrics and Gynecology

## 2023-03-28 ENCOUNTER — Ambulatory Visit (INDEPENDENT_AMBULATORY_CARE_PROVIDER_SITE_OTHER): Payer: Medicaid Other | Admitting: Certified Nurse Midwife

## 2023-03-28 VITALS — BP 109/77 | HR 109 | Wt 176.0 lb

## 2023-03-28 DIAGNOSIS — Z3483 Encounter for supervision of other normal pregnancy, third trimester: Secondary | ICD-10-CM

## 2023-03-28 DIAGNOSIS — Z3480 Encounter for supervision of other normal pregnancy, unspecified trimester: Secondary | ICD-10-CM

## 2023-03-28 DIAGNOSIS — Z3A33 33 weeks gestation of pregnancy: Secondary | ICD-10-CM

## 2023-03-28 DIAGNOSIS — E669 Obesity, unspecified: Secondary | ICD-10-CM

## 2023-03-28 NOTE — Patient Instructions (Signed)
Preterm Labor Pregnancy normally lasts 39-41 weeks. Preterm labor is when labor starts before you have been pregnant for 37 weeks. Babies who are born too early may have a higher risk for long-term problems like cerebral palsy or developmental delays. They may also have problems soon after birth, such as problems with blood sugar, body temperature, heart, and breathing. These problems may be very serious in babies who are born before 34 weeks of pregnancy. What are the causes? The cause of this condition is not known. What increases the risk? You are more likely to have preterm labor if: You have medical problems, now or in the past. You have problems now or in your past pregnancies. You have lifestyle problems. Medical history You have problems of the womb (uterus). You have an infection, including infections you get from sex. You have problems that do not go away, such as: Blood clots. High blood pressure. High blood sugar. You have low body weight or too much body weight. Present and past pregnancies You have had preterm labor before. You are pregnant with two babies or more. You have a condition in which the placenta covers your cervix. You waited less than 18 months between giving birth and becoming pregnant again. Your unborn baby has some problems. You have bleeding from your vagina. You became pregnant by a method called IVF. Lifestyle You smoke. You drink alcohol. You use drugs. You have stress. You have abuse in your home. You come in contact with chemicals that harm the body (pollutants). Other factors You are younger than 17 years or older than 35 years. What are the signs or symptoms? Symptoms of this condition include: Cramps. The cramps may feel like cramps from a period. You may also have watery poop (diarrhea). Pain in the belly (abdomen). Pain in the lower back. Regular contractions. It may feel like your belly is getting tighter. Pressure in the lower  belly. More fluid leaking from the vagina. The fluid may be watery or bloody. Water breaking. How is this treated? Treatment for this condition depends on your health, the health of your baby, and how old your pregnancy is. It may include: Taking medicines, such as: Hormone medicines. Medicines to stop contractions. Medicines to help mature the baby's lungs. Medicines to prevent your baby from getting cerebral palsy or other problems. Bed rest. If the labor happens before 34 weeks of pregnancy, you may need to stay in the hospital. Delivering the baby. Follow these instructions at home:  Do not smoke or use any products that contain nicotine or tobacco. If you need help quitting, ask your doctor. Do not drink alcohol. Take over-the-counter and prescription medicines only as told by your doctor. Rest as told by your doctor. Return to your normal activities when your doctor says that it is safe. Keep all follow-up visits. How is this prevented? To have a healthy pregnancy: Do not use drugs. Do not use any medicines unless you ask your doctor if they are safe for you. Talk with your doctor before taking any herbal supplements. Make sure you gain enough weight. Watch for infection. If you think you might have an infection, get it checked right away. Symptoms of infection may include: Fever. Vaginal discharge that smells bad or is not normal. Pain or burning when you pee. Needing to pee urgently. Needing to pee often. Peeing small amounts often. Blood in your pee. Pee that smells bad or unusual. Where to find more information U.S. Department of Health and Human Services   Office on Women's Health: www.womenshealth.gov The American College of Obstetricians and Gynecologists: www.acog.org Centers for Disease Control and Prevention: www.cdc.gov Contact a doctor if: You think you are going into preterm labor. You have symptoms of preterm labor. You have symptoms of infection. Get help  right away if: You are having painful contractions every 5 minutes or less. Your water breaks. Summary Preterm labor is labor that starts before you reach 37 weeks of pregnancy. Your baby may have problems if delivered early. You are more likely to have preterm labor if you have certain medical problems or problems with a pregnancy now or in the past. Some lifestyle factors can also increase the risk. Contact a doctor if you have symptoms of preterm labor. This information is not intended to replace advice given to you by your health care provider. Make sure you discuss any questions you have with your health care provider. Document Revised: 09/28/2020 Document Reviewed: 09/28/2020 Elsevier Patient Education  2024 Elsevier Inc.  

## 2023-03-28 NOTE — Progress Notes (Addendum)
ROB, no concerns today. Baby is moving well. Pt ask about stool softner. She was having some constipation she to miralax which cause diarrhea. She is now experiencing some mild constipation. Reccommended use of one colace a day to start . She may increase to 2 if not working. She agrees.Follow up 2 wks for ROB.    Doreene Burke, CNM

## 2023-03-30 ENCOUNTER — Other Ambulatory Visit: Payer: Self-pay

## 2023-03-30 ENCOUNTER — Observation Stay
Admission: EM | Admit: 2023-03-30 | Discharge: 2023-03-30 | Disposition: A | Discharge status: Home / Self Care | Payer: Medicaid Other | Attending: Obstetrics | Admitting: Obstetrics

## 2023-03-30 ENCOUNTER — Encounter: Payer: Self-pay | Admitting: Obstetrics and Gynecology

## 2023-03-30 DIAGNOSIS — Z3480 Encounter for supervision of other normal pregnancy, unspecified trimester: Secondary | ICD-10-CM

## 2023-03-30 DIAGNOSIS — O99613 Diseases of the digestive system complicating pregnancy, third trimester: Secondary | ICD-10-CM

## 2023-03-30 DIAGNOSIS — O212 Late vomiting of pregnancy: Secondary | ICD-10-CM | POA: Diagnosis present

## 2023-03-30 DIAGNOSIS — O99013 Anemia complicating pregnancy, third trimester: Secondary | ICD-10-CM | POA: Diagnosis present

## 2023-03-30 DIAGNOSIS — Z9049 Acquired absence of other specified parts of digestive tract: Secondary | ICD-10-CM | POA: Diagnosis not present

## 2023-03-30 DIAGNOSIS — K5090 Crohn's disease, unspecified, without complications: Secondary | ICD-10-CM | POA: Diagnosis not present

## 2023-03-30 DIAGNOSIS — D649 Anemia, unspecified: Secondary | ICD-10-CM | POA: Diagnosis not present

## 2023-03-30 DIAGNOSIS — Z98891 History of uterine scar from previous surgery: Secondary | ICD-10-CM | POA: Insufficient documentation

## 2023-03-30 DIAGNOSIS — O219 Vomiting of pregnancy, unspecified: Secondary | ICD-10-CM | POA: Diagnosis present

## 2023-03-30 DIAGNOSIS — Z3A33 33 weeks gestation of pregnancy: Secondary | ICD-10-CM

## 2023-03-30 DIAGNOSIS — O09899 Supervision of other high risk pregnancies, unspecified trimester: Secondary | ICD-10-CM

## 2023-03-30 DIAGNOSIS — Z6791 Unspecified blood type, Rh negative: Secondary | ICD-10-CM

## 2023-03-30 LAB — CBC
HCT: 27.1 % — ABNORMAL LOW (ref 36.0–46.0)
Hemoglobin: 8 g/dL — ABNORMAL LOW (ref 12.0–15.0)
MCH: 20.6 pg — ABNORMAL LOW (ref 26.0–34.0)
MCHC: 29.5 g/dL — ABNORMAL LOW (ref 30.0–36.0)
MCV: 69.8 fL — ABNORMAL LOW (ref 80.0–100.0)
Platelets: 319 10*3/uL (ref 150–400)
RBC: 3.88 MIL/uL (ref 3.87–5.11)
RDW: 16.1 % — ABNORMAL HIGH (ref 11.5–15.5)
WBC: 11.1 10*3/uL — ABNORMAL HIGH (ref 4.0–10.5)
nRBC: 0 % (ref 0.0–0.2)

## 2023-03-30 LAB — URINALYSIS, ROUTINE W REFLEX MICROSCOPIC
Bilirubin Urine: NEGATIVE
Glucose, UA: NEGATIVE mg/dL
Hgb urine dipstick: NEGATIVE
Ketones, ur: NEGATIVE mg/dL
Nitrite: NEGATIVE
Protein, ur: NEGATIVE mg/dL
Specific Gravity, Urine: 1.016 (ref 1.005–1.030)
pH: 6 (ref 5.0–8.0)

## 2023-03-30 MED ORDER — IRON SUCROSE 500 MG IVPB - SIMPLE MED
500.0000 mg | Freq: Once | INTRAVENOUS | Status: DC
  Filled 2023-03-30: qty 275

## 2023-03-30 MED ORDER — METHYLPREDNISOLONE SODIUM SUCC 125 MG IJ SOLR: 125.0000 mg | Freq: Once | INTRAMUSCULAR | Status: DC | PRN

## 2023-03-30 MED ORDER — ALBUTEROL SULFATE (2.5 MG/3ML) 0.083% IN NEBU: 2.5000 mg | INHALATION_SOLUTION | Freq: Once | RESPIRATORY_TRACT | Status: DC | PRN

## 2023-03-30 MED ORDER — SODIUM CHLORIDE 0.9 % IV BOLUS: 500.0000 mL | Freq: Once | INTRAVENOUS | Status: DC | PRN

## 2023-03-30 MED ORDER — LOPERAMIDE HCL 2 MG PO CAPS
2.0000 mg | ORAL_CAPSULE | ORAL | Status: DC | PRN
  Administered 2023-03-30: 2 mg via ORAL
  Filled 2023-03-30 (×3): qty 1

## 2023-03-30 MED ORDER — EPINEPHRINE PF 1 MG/ML IJ SOLN: 0.3000 mg | Freq: Once | INTRAMUSCULAR | Status: DC | PRN

## 2023-03-30 MED ORDER — LACTATED RINGERS IV SOLN: INTRAVENOUS | Status: DC

## 2023-03-30 MED ORDER — ONDANSETRON HCL 4 MG/2ML IJ SOLN
4.0000 mg | Freq: Four times a day (QID) | INTRAMUSCULAR | Status: DC | PRN
  Administered 2023-03-30: 4 mg via INTRAVENOUS
  Filled 2023-03-30: qty 2

## 2023-03-30 MED ORDER — DIPHENHYDRAMINE HCL 50 MG/ML IJ SOLN: 25.0000 mg | Freq: Once | INTRAMUSCULAR | Status: DC | PRN

## 2023-03-30 MED ORDER — SODIUM CHLORIDE 0.9 % IV SOLN: INTRAVENOUS | Status: DC | PRN

## 2023-03-30 NOTE — Final Progress Note (Signed)
Final Progress Note  Patient ID: Sandra Wilkerson MRN: [id number] DOB/AGE: [birth year] 27 y.o.  Admit date: 03/30/2023 Admitting provider: Hildred Laser, MD Discharge date: 03/30/2023   Admission Diagnoses: Nausea and vomiting in pregnancy [redacted] week gestation Crohns Disease Discharge Diagnoses:  Principal Problem:   Nausea and vomiting in pregnancy Active Problems:   Anemia in pregnancy, third trimester  Resolved N/V  History of Present Illness: The patient is a 27 y.o. female G2P1001 at [redacted]w[redacted]d who presents for  multiple episodes of nausea and vomiting today. She has not self treated prior to her arrival. She is being seen by a GI specialist who manages her Crohns Disease during this pregnancy. She has had little to eat or drink today. She denies any cramping or LOF today. Her baby is moving well.  Past Medical History:  Diagnosis Date   Crohn disease (HCC)    PCOS (polycystic ovarian syndrome)     Past Surgical History:  Procedure Laterality Date   APPENDECTOMY     CESAREAN SECTION  07/15/2021   Procedure: CESAREAN SECTION;  Surgeon: Linzie Collin, MD;  Location: [facility] ORS;  Service: Obstetrics;;   CHOLECYSTECTOMY     COLECTOMY     COLOSTOMY     COLOSTOMY REVERSAL     ERCP N/A 11/07/2021   Procedure: ENDOSCOPIC RETROGRADE CHOLANGIOPANCREATOGRAPHY (ERCP);  Surgeon: Midge Minium, MD;  Location: [facility] ENDOSCOPY;  Service: Endoscopy;  Laterality: N/A;    No current facility-administered medications on file prior to encounter.   Current Outpatient Medications on File Prior to Encounter  Medication Sig Dispense Refill   acetaminophen (TYLENOL) 325 MG tablet Take 2 tablets (650 mg total) by mouth every 6 (six) hours as needed for mild pain (or Fever >/= 101).     ferrous sulfate 325 (65 FE) MG EC tablet Take 1 tablet (325 mg total) by mouth 2 (two) times daily. 60 tablet 3   inFLIXimab (REMICADE IV) Inject 10 mg PE/kg into the vein every 28 (twenty-eight) days.      Prenat-FeAsp-Meth-FA-DHA w/o A (PRENATE PIXIE) 10-0.6-0.4-200 MG CAPS Take 1 capsule by mouth daily. 30 capsule 11   cholestyramine (QUESTRAN) 4 g packet Take by mouth. (Patient not taking: Reported on 12/13/2022)      Allergies  Allergen Reactions   Other Anaphylaxis    Sodium Ferric Gluconate    Social History   Socioeconomic History   Marital status: Significant Other    Spouse name: Everlean Alstrom   Number of children: 1   Years of education: 16   Highest education level: Not on file  Occupational History   Occupation: childcare  Tobacco Use   Smoking status: Never   Smokeless tobacco: Never  Vaping Use   Vaping Use: Never used  Substance and Sexual Activity   Alcohol use: No   Drug use: No   Sexual activity: Yes    Partners: Male    Birth control/protection: Implant, I.U.D.  Other Topics Concern   Not on file  Social History Narrative   Not on file   Social Determinants of Health   Financial Resource Strain: Low Risk  (10/05/2022)   Overall Financial Resource Strain (CARDIA)    Difficulty of Paying Living Expenses: Not very hard  Food Insecurity: No Food Insecurity (10/05/2022)   Hunger Vital Sign    Worried About Running Out of Food in the Last Year: Never true    Ran Out of Food in the Last Year: Never true  Transportation Needs: No Transportation Needs (10/05/2022)  PRAPARE - Administrator, Civil Service (Medical): No    Lack of Transportation (Non-Medical): No  Physical Activity: Insufficiently Active (10/05/2022)   Exercise Vital Sign    Days of Exercise per Week: 1 day    Minutes of Exercise per Session: 10 min  Stress: No Stress Concern Present (10/05/2022)   [employer] of Occupational Health - Occupational Stress Questionnaire    Feeling of Stress : Not at all  Social Connections: Moderately Integrated (10/05/2022)   Social Connection and Isolation Panel [NHANES]    Frequency of Communication with Friends and Family: More than three  times a week    Frequency of Social Gatherings with Friends and Family: Three times a week    Attends Religious Services: More than 4 times per year    Active Member of Clubs or Organizations: No    Attends Banker Meetings: Never    Marital Status: Living with partner  Intimate Partner Violence: Not At Risk (10/05/2022)   Humiliation, Afraid, Rape, and Kick questionnaire    Fear of Current or Ex-Partner: No    Emotionally Abused: No    Physically Abused: No    Sexually Abused: No    Family History  Problem Relation Age of Onset   Healthy Mother    Asthma Father    Healthy Sister    Healthy Sister    Healthy Sister    Healthy Sister    Healthy Sister    Diabetes Maternal Grandmother    Alzheimer's disease Maternal Grandmother    Healthy Maternal Grandfather    Congestive Heart Failure Paternal Grandmother      Review of Systems  Constitutional: Negative.   HENT: Negative.    Eyes: Negative.   Respiratory: Negative.    Cardiovascular: Negative.   Gastrointestinal:  Positive for nausea and vomiting.  Genitourinary: Negative.   Musculoskeletal: Negative.   Skin: Negative.   Neurological: Negative.   Endo/Heme/Allergies: Negative.   Psychiatric/Behavioral: Negative.       Physical Exam: BP 124/72 (BP Location: Left Arm)   Pulse (!) 105   Temp 98.2 F (36.8 C) (Oral)   Resp 16   Ht 4\' 11"  (1.499 m)   Wt 79.8 kg   LMP 08/07/2022 (Exact Date)   BMI 35.55 kg/m   Physical Exam Constitutional:      Appearance: She is obese.  Genitourinary:     Genitourinary Comments: Exam deferred  HENT:     Head: Normocephalic and atraumatic.  Cardiovascular:     Rate and Rhythm: Normal rate and regular rhythm.     Pulses: Normal pulses.     Heart sounds: Normal heart sounds.  Pulmonary:     Effort: Pulmonary effort is normal.     Breath sounds: Normal breath sounds.  Abdominal:     Comments: Gravid abdomen  Musculoskeletal:        General: Normal range of  motion.     Cervical back: Normal range of motion and neck supple.  Neurological:     General: No focal deficit present.     Mental Status: She is alert and oriented to person, place, and time.  Skin:    General: Skin is warm and dry.  Psychiatric:        Mood and Affect: Mood normal.        Behavior: Behavior normal.     Consults: None  Significant Findings/ Diagnostic Studies: labs:  Results for orders placed or performed during the hospital encounter of  03/30/23 (from the past 24 hour(s))  Urinalysis, Routine w reflex microscopic -Urine, Clean Catch     Status: Abnormal   Collection Time: 03/30/23  6:21 PM  Result Value Ref Range   Color, Urine YELLOW (A) YELLOW   APPearance HAZY (A) CLEAR   Specific Gravity, Urine 1.016 1.005 - 1.030   pH 6.0 5.0 - 8.0   Glucose, UA NEGATIVE NEGATIVE mg/dL   Hgb urine dipstick NEGATIVE NEGATIVE   Bilirubin Urine NEGATIVE NEGATIVE   Ketones, ur NEGATIVE NEGATIVE mg/dL   Protein, ur NEGATIVE NEGATIVE mg/dL   Nitrite NEGATIVE NEGATIVE   Leukocytes,Ua TRACE (A) NEGATIVE   RBC / HPF 0-5 0 - 5 RBC/hpf   WBC, UA 0-5 0 - 5 WBC/hpf   Bacteria, UA FEW (A) NONE SEEN   Squamous Epithelial / HPF 6-10 0 - 5 /HPF   Mucus PRESENT   CBC     Status: Abnormal   Collection Time: 03/30/23  6:40 PM  Result Value Ref Range   WBC 11.1 (H) 4.0 - 10.5 K/uL   RBC 3.88 3.87 - 5.11 MIL/uL   Hemoglobin 8.0 (L) 12.0 - 15.0 g/dL   HCT 29.5 (L) 62.1 - 30.8 %   MCV 69.8 (L) 80.0 - 100.0 fL   MCH 20.6 (L) 26.0 - 34.0 pg   MCHC 29.5 (L) 30.0 - 36.0 g/dL   RDW 65.7 (H) 84.6 - 96.2 %   Platelets 319 150 - 400 K/uL   nRBC 0.0 0.0 - 0.2 %     Procedures: EFM NST Baseline FHR: 135 baseline beats/min Variability: moderate Accelerations: present Decelerations: absent Tocometry: contractions q 2.5 minutes, moderate intensity to palpation Pitocin on 10 mu/min  Interpretation:  INDICATIONS: rule out uterine contractions RESULTS:  A NST procedure was performed  with FHR monitoring and a normal baseline established, appropriate time of 20-40 minutes of evaluation, and accels >2 seen w 15x15 characteristics.  Results show a REACTIVE NST.    Hospital Course: The patient was admitted to Labor and Delivery Triage for observation. She was placed on the fetal monitor. An IV infusion was started. Her nausea and vomiting were addressed with IV Zofran and po Immodium. Labs were drawn and indicated a low H and H. With IV hydration, antiemetics and Immodium, she felt much improved. She is discharged home with plans to contact her GI specialist (who advised her to have an iron infusion) early next week and arrange for iron infusion. She will aslo keep her next OB appointment.  Discharge Condition: good  Disposition: Discharge disposition: 01-Home or Self Care       Diet: Regular diet and higher iron foods.  Discharge Activity: Activity as tolerated  Discharge Instructions     Notify physician for a general feeling that "something is not right"   Complete by: As directed    Notify physician for increase or change in vaginal discharge   Complete by: As directed    Notify physician for intestinal cramps, with or without diarrhea, sometimes described as "gas pain"   Complete by: As directed    Notify physician for leaking of fluid   Complete by: As directed    Notify physician for low, dull backache, unrelieved by heat or Tylenol   Complete by: As directed    Notify physician for menstrual like cramps   Complete by: As directed    Notify physician for pelvic pressure   Complete by: As directed    Notify physician for uterine contractions.  These may  be painless and feel like the uterus is tightening or the baby is  "balling up"   Complete by: As directed    Notify physician for vaginal bleeding   Complete by: As directed    PRETERM LABOR:  Includes any of the follwing symptoms that occur between 20 - [redacted] weeks gestation.  If these symptoms are not  stopped, preterm labor can result in preterm delivery, placing your baby at risk   Complete by: As directed       Allergies as of 03/30/2023       Reactions   Other Anaphylaxis   Sodium Ferric Gluconate        Medication List     TAKE these medications    acetaminophen 325 MG tablet Commonly known as: TYLENOL Take 2 tablets (650 mg total) by mouth every 6 (six) hours as needed for mild pain (or Fever >/= 101).   cholestyramine 4 g packet Commonly known as: QUESTRAN Take by mouth.   ferrous sulfate 325 (65 FE) MG EC tablet Take 1 tablet (325 mg total) by mouth 2 (two) times daily.   Prenate Pixie 10-0.6-0.4-200 MG Caps Take 1 capsule by mouth daily.   REMICADE IV Inject 10 mg PE/kg into the vein every 28 (twenty-eight) days.         Total time spent taking care of this patient: 60 minutes providing direct care, chart review and documentation.  Signed: Mirna Mires, CNM  03/30/2023, 8:56 PM

## 2023-03-30 NOTE — Discharge Summary (Signed)
   Please see Final Progress Note.  Mirna Mires, CNM  03/30/2023 8:54 PM

## 2023-03-30 NOTE — OB Triage Note (Signed)
N/V/D since 0900 this am. Reports occasional ctx. Chief complaint N/V/D. Denis vaginal bleeding or LOF. + fetal movement auditory. Sandra Wilkerson

## 2023-04-03 ENCOUNTER — Telehealth: Payer: Self-pay

## 2023-04-03 ENCOUNTER — Observation Stay
Admission: EM | Admit: 2023-04-03 | Discharge: 2023-04-03 | Disposition: A | Discharge status: Home / Self Care | Payer: Medicaid Other | Attending: Obstetrics and Gynecology | Admitting: Obstetrics and Gynecology

## 2023-04-03 ENCOUNTER — Encounter: Payer: Self-pay | Admitting: Obstetrics and Gynecology

## 2023-04-03 ENCOUNTER — Other Ambulatory Visit: Payer: Self-pay | Admitting: Obstetrics

## 2023-04-03 DIAGNOSIS — R519 Headache, unspecified: Secondary | ICD-10-CM | POA: Diagnosis not present

## 2023-04-03 DIAGNOSIS — Z3A34 34 weeks gestation of pregnancy: Secondary | ICD-10-CM | POA: Diagnosis not present

## 2023-04-03 DIAGNOSIS — O26893 Other specified pregnancy related conditions, third trimester: Principal | ICD-10-CM | POA: Insufficient documentation

## 2023-04-03 MED ORDER — BUTALBITAL-APAP-CAFFEINE 50-325-40 MG PO TABS
ORAL_TABLET | ORAL | Status: AC
  Administered 2023-04-03: 1
  Filled 2023-04-03: qty 1

## 2023-04-03 MED ORDER — BUTALBITAL-APAP-CAFFEINE 50-325-40 MG PO TABS: 1.0000 | ORAL_TABLET | Freq: Once | ORAL | Status: DC

## 2023-04-03 MED ORDER — BUTALBITAL-APAP-CAFFEINE 50-325-40 MG PO TABS: 1.0000 | ORAL_TABLET | Freq: Once | ORAL | Status: AC

## 2023-04-03 MED ORDER — BUTALBITAL-APAP-CAFFEINE 50-325-40 MG PO TABS
1.0000 | ORAL_TABLET | Freq: Four times a day (QID) | ORAL | 0 refills | Status: DC | PRN
Start: 2023-04-03 — End: 2023-05-10

## 2023-04-03 NOTE — Discharge Summary (Signed)
   See final Progress note.  Mirna Mires, CNM  04/03/2023 8:55 PM

## 2023-04-03 NOTE — Final Progress Note (Signed)
Final Progress Note  Patient ID: Sandra Wilkerson MRN: [id number] DOB/AGE: 1995/11/23 27 y.o.  Admit date: 04/03/2023 Admitting provider: Mirna Mires, CNM Discharge date: 04/03/2023   Admission Diagnoses: headache in pregnancy  Discharge Diagnoses:  Principal Problem:   Headache in pregnancy, antepartum, third trimester  [redacted] week gestation Reactive fetal heart tones  History of Present Illness: The patient is a 27 y.o. female G2P1001 at [redacted]w[redacted]d who presents for  complaint of a two day headache that is described as frontal and relatively constant.she denies any Obstetrical problems; she denies contractions, LOF and her baby is moving well. She has tried to take tylenol several times but has not found relief.  She has not contacted the triage staff at [facility] during the two days of headache, but opted to come to triage this evening.    Past Medical History:  Diagnosis Date   Crohn disease (HCC)    PCOS (polycystic ovarian syndrome)     Past Surgical History:  Procedure Laterality Date   APPENDECTOMY     CESAREAN SECTION  07/15/2021   Procedure: CESAREAN SECTION;  Surgeon: Linzie Collin, MD;  Location: [facility] ORS;  Service: Obstetrics;;   CHOLECYSTECTOMY     COLECTOMY     COLOSTOMY     COLOSTOMY REVERSAL     ERCP N/A 11/07/2021   Procedure: ENDOSCOPIC RETROGRADE CHOLANGIOPANCREATOGRAPHY (ERCP);  Surgeon: Midge Minium, MD;  Location: [facility] ENDOSCOPY;  Service: Endoscopy;  Laterality: N/A;    No current facility-administered medications on file prior to encounter.   Current Outpatient Medications on File Prior to Encounter  Medication Sig Dispense Refill   acetaminophen (TYLENOL) 325 MG tablet Take 2 tablets (650 mg total) by mouth every 6 (six) hours as needed for mild pain (or Fever >/= 101).     cholestyramine (QUESTRAN) 4 g packet Take by mouth. (Patient not taking: Reported on 12/13/2022)     ferrous sulfate 325 (65 FE) MG EC tablet Take 1 tablet (325 mg total) by mouth 2  (two) times daily. 60 tablet 3   inFLIXimab (REMICADE IV) Inject 10 mg PE/kg into the vein every 28 (twenty-eight) days.     Prenat-FeAsp-Meth-FA-DHA w/o A (PRENATE PIXIE) 10-0.6-0.4-200 MG CAPS Take 1 capsule by mouth daily. 30 capsule 11    Allergies  Allergen Reactions   Other Anaphylaxis    Sodium Ferric Gluconate    Social History   Socioeconomic History   Marital status: Significant Other    Spouse name: Everlean Alstrom   Number of children: 1   Years of education: 16   Highest education level: Not on file  Occupational History   Occupation: childcare  Tobacco Use   Smoking status: Never   Smokeless tobacco: Never  Vaping Use   Vaping Use: Never used  Substance and Sexual Activity   Alcohol use: No   Drug use: No   Sexual activity: Yes    Partners: Male    Birth control/protection: Implant, I.U.D.  Other Topics Concern   Not on file  Social History Narrative   Not on file   Social Determinants of Health   Financial Resource Strain: Low Risk  (10/05/2022)   Overall Financial Resource Strain (CARDIA)    Difficulty of Paying Living Expenses: Not very hard  Food Insecurity: No Food Insecurity (10/05/2022)   Hunger Vital Sign    Worried About Running Out of Food in the Last Year: Never true    Ran Out of Food in the Last Year: Never true  Transportation  Needs: No Transportation Needs (10/05/2022)   PRAPARE - Administrator, Civil Service (Medical): No    Lack of Transportation (Non-Medical): No  Physical Activity: Insufficiently Active (10/05/2022)   Exercise Vital Sign    Days of Exercise per Week: 1 day    Minutes of Exercise per Session: 10 min  Stress: No Stress Concern Present (10/05/2022)   [employer] of Occupational Health - Occupational Stress Questionnaire    Feeling of Stress : Not at all  Social Connections: Moderately Integrated (10/05/2022)   Social Connection and Isolation Panel [NHANES]    Frequency of Communication with Friends and  Family: More than three times a week    Frequency of Social Gatherings with Friends and Family: Three times a week    Attends Religious Services: More than 4 times per year    Active Member of Clubs or Organizations: No    Attends Banker Meetings: Never    Marital Status: Living with partner  Intimate Partner Violence: Not At Risk (10/05/2022)   Humiliation, Afraid, Rape, and Kick questionnaire    Fear of Current or Ex-Partner: No    Emotionally Abused: No    Physically Abused: No    Sexually Abused: No    Family History  Problem Relation Age of Onset   Healthy Mother    Asthma Father    Healthy Sister    Healthy Sister    Healthy Sister    Healthy Sister    Healthy Sister    Diabetes Maternal Grandmother    Alzheimer's disease Maternal Grandmother    Healthy Maternal Grandfather    Congestive Heart Failure Paternal Grandmother      Review of Systems  Constitutional: Negative.   HENT: Negative.    Eyes: Negative.   Respiratory: Negative.    Cardiovascular: Negative.   Gastrointestinal: Negative.   Genitourinary: Negative.   Musculoskeletal: Negative.   Skin: Negative.   Neurological:  Positive for headaches.  Endo/Heme/Allergies: Negative.   Psychiatric/Behavioral: Negative.       Physical Exam: BP 108/67   Pulse 99   Temp 99.4 F (37.4 C) (Oral)   Resp 16   LMP 08/07/2022 (Exact Date)   Physical Exam Constitutional:      Appearance: Normal appearance. She is obese.  Genitourinary:     Genitourinary Comments: deferred  Cardiovascular:     Rate and Rhythm: Normal rate and regular rhythm.  Pulmonary:     Effort: Pulmonary effort is normal.     Breath sounds: Normal breath sounds.  Abdominal:     Comments: Gravid abdomen.  Skin:    General: Skin is warm and dry.  Psychiatric:        Mood and Affect: Mood normal.        Behavior: Behavior normal.     Consults: None  Significant Findings/ Diagnostic Studies: none  Procedures:  EFM NST Baseline FHR: 140 beats/min Variability: moderate Accelerations: present Decelerations: absent Tocometry: no contractions noted or reported  Interpretation:  INDICATIONS: patient reassurance and rule out uterine contractions RESULTS:  A NST procedure was performed with FHR monitoring and a normal baseline established, appropriate time of 20-40 minutes of evaluation, and accels >2 seen w 15x15 characteristics.  Results show a REACTIVE NST.    Hospital Course: The patient was admitted to Labor and Delivery Triage for observation. Her vital signs were taken and she was found to be normotensive. She was assessed by the RN, who found no OB concerns or problems. With  a reactive, reassuring FHT strip, she was provided Fioricet  to address her headache.she was then discharged home with a prescription for limited fioricet to take as needed. She will follow up at [facility] for her next return OB visit. She is also scheduled for iron infusions due to her anemia.  Discharge Condition: good  Disposition: Discharge disposition: 01-Home or Self Care       Diet: Regular diet  Discharge Activity: Activity as tolerated  Discharge Instructions     Fetal Kick Count:  Lie on our left side for one hour after a meal, and count the number of times your baby kicks.  If it is less than 5 times, get up, move around and drink some juice.  Repeat the test 30 minutes later.  If it is still less than 5 kicks in an hour, notify your doctor.   Complete by: As directed    LABOR:  When conractions begin, you should start to time them from the beginning of one contraction to the beginning  of the next.  When contractions are 5 - 10 minutes apart or less and have been regular for at least an hour, you should call your health care provider.   Complete by: As directed    Notify physician for bleeding from the vagina   Complete by: As directed    Notify physician for blurring of vision or spots before the eyes    Complete by: As directed    Notify physician for chills or fever   Complete by: As directed    Notify physician for fainting spells, "black outs" or loss of consciousness   Complete by: As directed    Notify physician for increase in vaginal discharge   Complete by: As directed    Notify physician for leaking of fluid   Complete by: As directed    Notify physician for pain or burning when urinating   Complete by: As directed    Notify physician for pelvic pressure (sudden increase)   Complete by: As directed    Notify physician for severe or continued nausea or vomiting   Complete by: As directed    Notify physician for sudden gushing of fluid from the vagina (with or without continued leaking)   Complete by: As directed    Notify physician for sudden, constant, or occasional abdominal pain   Complete by: As directed    Notify physician if baby moving less than usual   Complete by: As directed       Allergies as of 04/03/2023       Reactions   Other Anaphylaxis   Sodium Ferric Gluconate        Medication List     TAKE these medications    acetaminophen 325 MG tablet Commonly known as: TYLENOL Take 2 tablets (650 mg total) by mouth every 6 (six) hours as needed for mild pain (or Fever >/= 101).   cholestyramine 4 g packet Commonly known as: QUESTRAN Take by mouth.   ferrous sulfate 325 (65 FE) MG EC tablet Take 1 tablet (325 mg total) by mouth 2 (two) times daily.   Prenate Pixie 10-0.6-0.4-200 MG Caps Take 1 capsule by mouth daily.   REMICADE IV Inject 10 mg PE/kg into the vein every 28 (twenty-eight) days.         Total time spent taking care of this patient: 30 minutes  Signed: Mirna Mires, CNM  04/03/2023, 8:55 PM

## 2023-04-03 NOTE — OB Triage Note (Signed)
Pt is a G2P1 at 34.1w with c/o headache x 24 hrs unrelieved with tylenol. Pt states she has been trying to stay hydrated. No history of B?P issues. Previous C/S for failure to progress. Plans a repeat C/S. Hx of IBS with cholectomy and ostomy. Endores+ FM FHR 144. Denies UCs or other labor complaints.

## 2023-04-03 NOTE — Telephone Encounter (Signed)
Pt calling to schedule an iron infusion.  [phone]

## 2023-04-04 ENCOUNTER — Encounter: Payer: Self-pay | Admitting: Maternal & Fetal Medicine

## 2023-04-04 ENCOUNTER — Other Ambulatory Visit: Payer: Self-pay

## 2023-04-04 ENCOUNTER — Ambulatory Visit: Payer: Medicaid Other | Attending: Maternal & Fetal Medicine

## 2023-04-04 VITALS — BP 114/78 | HR 110 | Temp 98.3°F | Ht 59.0 in | Wt 174.5 lb

## 2023-04-04 DIAGNOSIS — O99213 Obesity complicating pregnancy, third trimester: Secondary | ICD-10-CM | POA: Diagnosis not present

## 2023-04-04 DIAGNOSIS — K50119 Crohn's disease of large intestine with unspecified complications: Secondary | ICD-10-CM | POA: Diagnosis not present

## 2023-04-04 DIAGNOSIS — O403XX0 Polyhydramnios, third trimester, not applicable or unspecified: Secondary | ICD-10-CM | POA: Diagnosis not present

## 2023-04-04 DIAGNOSIS — Z3480 Encounter for supervision of other normal pregnancy, unspecified trimester: Secondary | ICD-10-CM

## 2023-04-04 DIAGNOSIS — O34219 Maternal care for unspecified type scar from previous cesarean delivery: Secondary | ICD-10-CM

## 2023-04-04 DIAGNOSIS — D649 Anemia, unspecified: Secondary | ICD-10-CM

## 2023-04-04 DIAGNOSIS — E669 Obesity, unspecified: Secondary | ICD-10-CM

## 2023-04-04 DIAGNOSIS — O09899 Supervision of other high risk pregnancies, unspecified trimester: Secondary | ICD-10-CM

## 2023-04-04 DIAGNOSIS — Z3A34 34 weeks gestation of pregnancy: Secondary | ICD-10-CM | POA: Insufficient documentation

## 2023-04-04 DIAGNOSIS — O26899 Other specified pregnancy related conditions, unspecified trimester: Secondary | ICD-10-CM

## 2023-04-04 DIAGNOSIS — Z362 Encounter for other antenatal screening follow-up: Secondary | ICD-10-CM | POA: Insufficient documentation

## 2023-04-04 DIAGNOSIS — Z6791 Unspecified blood type, Rh negative: Secondary | ICD-10-CM

## 2023-04-04 DIAGNOSIS — O99613 Diseases of the digestive system complicating pregnancy, third trimester: Secondary | ICD-10-CM | POA: Insufficient documentation

## 2023-04-04 NOTE — Telephone Encounter (Signed)
Pt states she spoke c [facility] last night in the ER; GI have been ordering the iron infusion; iron infusion is scheduled per pt.

## 2023-04-11 NOTE — Telephone Encounter (Signed)
Infusions are scheduled at [facility].

## 2023-04-17 ENCOUNTER — Encounter: Payer: Self-pay | Admitting: Obstetrics and Gynecology

## 2023-04-17 ENCOUNTER — Ambulatory Visit (INDEPENDENT_AMBULATORY_CARE_PROVIDER_SITE_OTHER): Payer: Medicaid Other | Admitting: Obstetrics and Gynecology

## 2023-04-17 ENCOUNTER — Other Ambulatory Visit (HOSPITAL_COMMUNITY): Admission: RE | Admit: 2023-04-17 | Payer: Medicaid Other | Source: Ambulatory Visit

## 2023-04-17 VITALS — BP 137/75 | HR 100 | Wt 179.0 lb

## 2023-04-17 DIAGNOSIS — O99013 Anemia complicating pregnancy, third trimester: Secondary | ICD-10-CM

## 2023-04-17 DIAGNOSIS — O0993 Supervision of high risk pregnancy, unspecified, third trimester: Secondary | ICD-10-CM | POA: Insufficient documentation

## 2023-04-17 DIAGNOSIS — D509 Iron deficiency anemia, unspecified: Secondary | ICD-10-CM

## 2023-04-17 DIAGNOSIS — Z3A36 36 weeks gestation of pregnancy: Secondary | ICD-10-CM

## 2023-04-17 DIAGNOSIS — Z113 Encounter for screening for infections with a predominantly sexual mode of transmission: Secondary | ICD-10-CM | POA: Diagnosis present

## 2023-04-17 DIAGNOSIS — O09293 Supervision of pregnancy with other poor reproductive or obstetric history, third trimester: Secondary | ICD-10-CM

## 2023-04-17 DIAGNOSIS — O99213 Obesity complicating pregnancy, third trimester: Secondary | ICD-10-CM

## 2023-04-17 DIAGNOSIS — O09899 Supervision of other high risk pregnancies, unspecified trimester: Secondary | ICD-10-CM

## 2023-04-17 DIAGNOSIS — O99513 Diseases of the respiratory system complicating pregnancy, third trimester: Secondary | ICD-10-CM

## 2023-04-17 DIAGNOSIS — K50119 Crohn's disease of large intestine with unspecified complications: Secondary | ICD-10-CM

## 2023-04-17 DIAGNOSIS — J45909 Unspecified asthma, uncomplicated: Secondary | ICD-10-CM

## 2023-04-17 DIAGNOSIS — O99613 Diseases of the digestive system complicating pregnancy, third trimester: Secondary | ICD-10-CM

## 2023-04-17 DIAGNOSIS — Z3685 Encounter for antenatal screening for Streptococcus B: Secondary | ICD-10-CM

## 2023-04-17 DIAGNOSIS — O34219 Maternal care for unspecified type scar from previous cesarean delivery: Secondary | ICD-10-CM

## 2023-04-17 DIAGNOSIS — Z3480 Encounter for supervision of other normal pregnancy, unspecified trimester: Secondary | ICD-10-CM

## 2023-04-17 DIAGNOSIS — E669 Obesity, unspecified: Secondary | ICD-10-CM

## 2023-04-17 LAB — POCT URINALYSIS DIPSTICK OB
Bilirubin, UA: NEGATIVE
Glucose, UA: NEGATIVE
Ketones, UA: NEGATIVE
Nitrite, UA: NEGATIVE
Spec Grav, UA: 1.01 (ref 1.010–1.025)
Urobilinogen, UA: 0.2 E.U./dL
pH, UA: 6 (ref 5.0–8.0)

## 2023-04-17 NOTE — Patient Instructions (Signed)
Signs and Symptoms of Labor Labor is the body's natural process of moving the baby and the placenta out of the uterus. The process of labor usually starts when the baby is full-term, between 30 and 41 weeks of pregnancy. Signs and symptoms that you are close to going into labor As your body prepares for labor and the birth of your baby, you may notice the following symptoms in the weeks and days before true labor starts: Passing a small amount of thick, bloody mucus from your vagina. This is called normal bloody show or losing your mucus plug. This may happen more than a week before labor begins, or right before labor begins, as the opening of the cervix starts to widen (dilate). For some women, the entire mucus plug passes at once. For others, pieces of the mucus plug may gradually pass over several days. Your baby moving (dropping) lower in your pelvis to get into position for birth (lightening). When this happens, you may feel more pressure on your bladder and pelvic bone and less pressure on your ribs. This may make it easier to breathe. It may also cause you to need to urinate more often and have problems with bowel movements. Having "practice contractions," also called Braxton Hicks contractions or false labor. These occur at irregular (unevenly spaced) intervals that are more than 10 minutes apart. False labor contractions are common after exercise or sexual activity. They will stop if you change position, rest, or drink fluids. These contractions are usually mild and do not get stronger over time. They may feel like: A backache or back pain. Mild cramps, similar to menstrual cramps. Tightening or pressure in your abdomen. Other early symptoms include: Nausea or loss of appetite. Diarrhea. Having a sudden burst of energy, or feeling very tired. Mood changes. Having trouble sleeping. Signs and symptoms that labor has begun Signs that you are in labor may include: Having contractions that come  at regular (evenly spaced) intervals and increase in intensity. This may feel like more intense tightening or pressure in your abdomen that moves to your back. Contractions may also feel like rhythmic pain in your upper thighs or back that comes and goes at regular intervals. If you are delivering for the first time, this change in intensity of contractions often occurs at a more gradual pace. If you have given birth before, you may notice a more rapid progression of contraction changes. Feeling pressure in the vaginal area. Your water breaking (rupture of membranes). This is when the sac of fluid that surrounds your baby breaks. Fluid leaking from your vagina may be clear or blood-tinged. Labor usually starts within 24 hours of your water breaking, but it may take longer to begin. Some people may feel a sudden gush of fluid; others may notice repeatedly damp underwear. Follow these instructions at home:  When labor starts, or if your water breaks, call your health care provider or nurse care line. Based on your situation, they will determine when you should go in for an exam. During early labor, you may be able to rest and manage symptoms at home. Some strategies to try at home include: Breathing and relaxation techniques. Taking a warm bath or shower. Listening to music. Using a heating pad on the lower back for pain. If directed, apply heat to the area as often as told by your health care provider. Use the heat source that your health care provider recommends, such as a moist heat pack or a heating pad. Place a  towel between your skin and the heat source. Leave the heat on for 20-30 minutes. Remove the heat if your skin turns bright red. This is especially important if you are unable to feel pain, heat, or cold. You have a greater risk of getting burned. Contact a health care provider if: Your labor has started. Your water breaks. You have nausea, vomiting, or diarrhea. Get help right away  if: You have painful, regular contractions that are 5 minutes apart or less. Labor starts before you are [redacted] weeks along in your pregnancy. You have a fever. You have bright red blood coming from your vagina. You do not feel your baby moving. You have a severe headache with or without vision problems. You have chest pain or shortness of breath. These symptoms may represent a serious problem that is an emergency. Do not wait to see if the symptoms will go away. Get medical help right away. Call your local emergency services (911 in the [place] Do not drive yourself to the hospital. Summary Labor is your body's natural process of moving your baby and the placenta out of your uterus. The process of labor usually starts when your baby is full-term, between 74 and 40 weeks of pregnancy. When labor starts, or if your water breaks, call your health care provider or nurse care line. Based on your situation, they will determine when you should go in for an exam. This information is not intended to replace advice given to you by your health care provider. Make sure you discuss any questions you have with your health care provider. Document Revised: 02/08/2021 Document Reviewed: 02/08/2021 Elsevier Patient Education  2024 [employer].

## 2023-04-17 NOTE — Progress Notes (Signed)
ROB: Patient is a 27 y.o. G2P1001 at [redacted]w[redacted]d who presents for routine OB care.  Pregnancy is complicated by Asthma; Crohn's colitis (HCC); Keloid scar; Other headache syndrome; Status post colostomy (HCC); Peritoneal abscess (HCC); Chronic bilateral thoracic back pain; History of infertility; Obesity (BMI 30-39.9); PCO (polycystic ovaries); Choledocholithiasis; Anemia; Chest pain; Esophageal reflux; Neck pain; Symptomatic mammary hypertrophy; Supervision of other normal pregnancy, antepartum; Rh negative state in antepartum period; Supervision of other high risk pregnancy, antepartum, unspecified trimester; Obesity in pregnancy; Previous cesarean delivery affecting pregnancy; and Anemia in pregnancy, third trimester;  antepartum, third trimester.   Patient has complaints of stronger Sandra Wilkerson that occurred several days ago.  Notes that they were painful. Did resolve with increasing fluid intake.  Discussed home comfort measures, discussed signs of labor.  Also notes being seen in triage ~ 2 weeks ago, was noted to have worsening anemia.  Received an iron infusion there, and due for next infusion this week. Wonders if she needs to do anything else. Advised that she can also continue PO iron as well, however notes some intolerance with OTC iron pills. Givens amples of Fusion Plus.  Also can recheck levels after next visit, and if third infusion is needed prior to C-section, can schedule. 36 week cultures performed today. Still unsure of contraceptive desires, debating between IUD and Nexplanon. Discussed pros and cons of each method. RTC in 1 week. For pre-op visit with Dr. Logan Bores at 37 or 38 week visit.

## 2023-04-17 NOTE — Progress Notes (Signed)
ROB: [redacted]w[redacted]d: She is doing well. She reports good fetal movement. She has no new concerns, cultures were done today.

## 2023-04-19 LAB — CERVICOVAGINAL ANCILLARY ONLY
Chlamydia: NEGATIVE
Comment: NEGATIVE
Comment: NORMAL
Neisseria Gonorrhea: NEGATIVE

## 2023-04-19 LAB — STREP GP B NAA: Strep Gp B NAA: NEGATIVE

## 2023-04-23 ENCOUNTER — Other Ambulatory Visit: Payer: Self-pay

## 2023-04-23 ENCOUNTER — Ambulatory Visit: Payer: Medicaid Other | Attending: Obstetrics and Gynecology

## 2023-04-23 VITALS — BP 119/83 | HR 108 | Temp 97.9°F | Ht 59.0 in | Wt 180.5 lb

## 2023-04-23 DIAGNOSIS — K5090 Crohn's disease, unspecified, without complications: Secondary | ICD-10-CM | POA: Diagnosis present

## 2023-04-23 DIAGNOSIS — O99213 Obesity complicating pregnancy, third trimester: Secondary | ICD-10-CM | POA: Diagnosis not present

## 2023-04-23 DIAGNOSIS — K50119 Crohn's disease of large intestine with unspecified complications: Secondary | ICD-10-CM | POA: Diagnosis not present

## 2023-04-23 DIAGNOSIS — O34219 Maternal care for unspecified type scar from previous cesarean delivery: Secondary | ICD-10-CM | POA: Insufficient documentation

## 2023-04-23 DIAGNOSIS — E669 Obesity, unspecified: Secondary | ICD-10-CM

## 2023-04-23 DIAGNOSIS — Z3A37 37 weeks gestation of pregnancy: Secondary | ICD-10-CM | POA: Diagnosis not present

## 2023-04-23 DIAGNOSIS — Z3480 Encounter for supervision of other normal pregnancy, unspecified trimester: Secondary | ICD-10-CM

## 2023-04-23 DIAGNOSIS — O403XX0 Polyhydramnios, third trimester, not applicable or unspecified: Secondary | ICD-10-CM | POA: Insufficient documentation

## 2023-04-23 DIAGNOSIS — Z6791 Unspecified blood type, Rh negative: Secondary | ICD-10-CM

## 2023-04-23 DIAGNOSIS — O09899 Supervision of other high risk pregnancies, unspecified trimester: Secondary | ICD-10-CM

## 2023-04-23 DIAGNOSIS — O99613 Diseases of the digestive system complicating pregnancy, third trimester: Secondary | ICD-10-CM | POA: Insufficient documentation

## 2023-04-25 ENCOUNTER — Other Ambulatory Visit: Payer: Self-pay | Admitting: Certified Nurse Midwife

## 2023-04-25 ENCOUNTER — Ambulatory Visit (INDEPENDENT_AMBULATORY_CARE_PROVIDER_SITE_OTHER): Payer: Medicaid Other | Admitting: Certified Nurse Midwife

## 2023-04-25 VITALS — BP 107/75 | HR 103 | Wt 182.0 lb

## 2023-04-25 DIAGNOSIS — O99013 Anemia complicating pregnancy, third trimester: Secondary | ICD-10-CM

## 2023-04-25 DIAGNOSIS — O0993 Supervision of high risk pregnancy, unspecified, third trimester: Secondary | ICD-10-CM

## 2023-04-25 DIAGNOSIS — D509 Iron deficiency anemia, unspecified: Secondary | ICD-10-CM

## 2023-04-25 DIAGNOSIS — Z3A37 37 weeks gestation of pregnancy: Secondary | ICD-10-CM

## 2023-04-25 LAB — POCT URINALYSIS DIPSTICK OB
Bilirubin, UA: NEGATIVE
Blood, UA: NEGATIVE
Glucose, UA: NEGATIVE
Ketones, UA: NEGATIVE
Leukocytes, UA: NEGATIVE
Nitrite, UA: NEGATIVE
Spec Grav, UA: 1.01 (ref 1.010–1.025)
Urobilinogen, UA: 1 E.U./dL
pH, UA: 7 (ref 5.0–8.0)

## 2023-04-25 NOTE — Progress Notes (Signed)
   PRENATAL VISIT NOTE  Subjective:  Sandra Wilkerson is a 27 y.o. G2P1001 at [redacted]w[redacted]d being seen today for ongoing prenatal care.  She is currently monitored for the following issues for this high-risk pregnancy and has Asthma; Crohn's colitis (HCC); Keloid scar; Other headache syndrome; Status post colostomy (HCC); Peritoneal abscess (HCC); Chronic bilateral thoracic back pain; History of infertility; Obesity (BMI 30-39.9); PCO (polycystic ovaries); Choledocholithiasis; Anemia; Chest pain; Maternal Crohn's disease affecting pregnancy in second trimester (HCC); Esophageal reflux; Neck pain; Symptomatic mammary hypertrophy; Supervision of other normal pregnancy, antepartum; Rh negative state in antepartum period; Supervision of other high risk pregnancy, antepartum, unspecified trimester; Obesity in pregnancy; Previous cesarean delivery affecting pregnancy; Labor and delivery, indication for care; Nausea and vomiting in pregnancy; Anemia in pregnancy, third trimester; and Headache in pregnancy, antepartum, third trimester on their problem list.  Patient reports no complaints.  Contractions: Not present. Vag. Bleeding: None.  Movement: Present. Denies leaking of fluid.   The following portions of the patient's history were reviewed and updated as appropriate: allergies, current medications, past family history, past medical history, past social history, past surgical history and problem list.   Objective:   Vitals:   04/25/23 1508  BP: 107/75  Pulse: (!) 103  Weight: 182 lb (82.6 kg)   Total weight gain: 12 lb (5.443 kg) Fetal Status: Fetal Heart Rate (bpm): 140 Fundal Height: 38 cm Movement: Present  Presentation: Vertex   General:  Alert, oriented and cooperative. Patient is in no acute distress.  Skin: Skin is warm and dry. No rash noted.   Cardiovascular: Normal heart rate noted  Respiratory: Normal respiratory effort, no problems with respiration noted  Abdomen: Soft, gravid, appropriate  for gestational age.  Pain/Pressure: Present     Pelvic: Cervical exam deferred        Extremities: Normal range of motion.     Mental Status: Normal mood and affect. Normal behavior. Normal judgment and thought content.   Assessment and Plan:  Pregnancy: G2P1001 at [redacted]w[redacted]d 1. Maternal iron deficiency anemia affecting pregnancy in third trimester, antepartum - CBC - POC Urinalysis Dipstick OB  2. High-risk pregnancy in third trimester - POC Urinalysis Dipstick OB Repeat Cesarean is scheduled for 7/29. Has completed 2 IVFe infusions, repeat CBC ordered today.  Term labor symptoms and general obstetric precautions including but not limited to vaginal bleeding, contractions, leaking of fluid and fetal movement were reviewed in detail with the patient. Please refer to After Visit Summary for other counseling recommendations.   Return in 1 week (on 05/02/2023) for ROB.  Future Appointments  Date Time Provider Department Center  04/30/2023 11:00 AM [facility] None  05/03/2023  2:55 PM Logan Bores Ellsworth Lennox, MD [facility] None    Dominica Severin, CNM

## 2023-04-26 LAB — CBC
Hematocrit: 31.3 % — ABNORMAL LOW (ref 34.0–46.6)
Hemoglobin: 9.5 g/dL — ABNORMAL LOW (ref 11.1–15.9)
MCH: 22.4 pg — ABNORMAL LOW (ref 26.6–33.0)
MCHC: 30.4 g/dL — ABNORMAL LOW (ref 31.5–35.7)
MCV: 74 fL — ABNORMAL LOW (ref 79–97)
Platelets: 241 10*3/uL (ref 150–450)
RBC: 4.25 x10E6/uL (ref 3.77–5.28)
RDW: 25.5 % — ABNORMAL HIGH (ref 11.7–15.4)
WBC: 9.3 10*3/uL (ref 3.4–10.8)

## 2023-04-27 ENCOUNTER — Encounter: Payer: Self-pay | Admitting: Certified Nurse Midwife

## 2023-04-30 ENCOUNTER — Other Ambulatory Visit: Payer: Self-pay

## 2023-04-30 ENCOUNTER — Encounter: Payer: Self-pay | Admitting: Obstetrics and Gynecology

## 2023-04-30 ENCOUNTER — Encounter
Admission: RE | Admit: 2023-04-30 | Discharge: 2023-04-30 | Disposition: A | Discharge status: Home / Self Care | Payer: Medicaid Other | Source: Ambulatory Visit | Attending: Obstetrics and Gynecology | Admitting: Obstetrics and Gynecology

## 2023-04-30 VITALS — Ht 59.0 in | Wt 180.0 lb

## 2023-04-30 DIAGNOSIS — O99013 Anemia complicating pregnancy, third trimester: Secondary | ICD-10-CM

## 2023-04-30 HISTORY — DX: Anemia, unspecified: D64.9

## 2023-04-30 HISTORY — DX: COVID-19: U07.1

## 2023-04-30 NOTE — Patient Instructions (Signed)
Your procedure is scheduled on: Monday 05/07/23    Report to the Registration Desk on the 1st floor of the [facility] at 6:00 am Free Valet parking is available. For questions call the [facility] [phone]  If your arrival time is 6:00 am, do not arrive before that time as the [facility] entrance doors do not open until 6:00 am.  REMEMBER: Instructions that are not followed completely may result in serious medical risk, up to and including death; or upon the discretion of your surgeon and anesthesiologist your surgery may need to be rescheduled.  Do not eat food after midnight the night before surgery.  No gum chewing or hard candies.  You may however, drink CLEAR liquids up to 2 hours before you are scheduled to arrive for your surgery. Do not drink anything within 2 hours of your scheduled arrival time.  Clear liquids include: - water  - apple juice without pulp - gatorade (not RED colors) - black coffee or tea (Do NOT add milk or creamers to the coffee or tea) Do NOT drink anything that is not on this list.  Type 1 and Type 2 diabetics should only drink water.  One week prior to surgery: Stop Anti-inflammatories (NSAIDS) such as Advil, Aleve, Ibuprofen, Motrin, Naproxen, Naprosyn and Aspirin based products such as Excedrin, Goody's Powder, BC Powder. You may however, continue to take Tylenol if needed for pain up until the day of surgery.  TAKE ONLY THESE MEDICATIONS THE MORNING OF SURGERY WITH A SIP OF WATER:  None  No Alcohol for 24 hours before or after surgery.  No Smoking including e-cigarettes for 24 hours before surgery.  No chewable tobacco products for at least 6 hours before surgery.  No nicotine patches on the day of surgery.  Do not use any "recreational" drugs for at least a week (preferably 2 weeks) before your surgery.  Please be advised that the combination of cocaine and anesthesia may have negative outcomes, up to and including death. If you test  positive for cocaine, your surgery will be cancelled.  On the morning of surgery brush your teeth with toothpaste and water, you may rinse your mouth with mouthwash if you wish. Do not swallow any toothpaste or mouthwash.  Use CHG Soap or wipes as directed on instruction sheet.  Do not wear lotions, powders, or perfumes.   Do not shave body hair from the neck down 48 hours before surgery.  Wear comfortable clothing (specific to your surgery type) to the hospital.  Do not wear jewelry, make-up, hairpins, clips or nail polish.  Contact lenses, hearing aids and dentures may not be worn into surgery.  Do not bring valuables to the hospital. [facility] is not responsible for any missing/lost belongings or valuables.   Notify your doctor if there is any change in your medical condition (cold, fever, infection).  If you are being discharged the day of surgery, you will not be allowed to drive home. You will need a responsible individual to drive you home and stay with you for 24 hours after surgery.   If you are taking public transportation, you will need to have a responsible individual with you.  If you are being admitted to the hospital overnight, leave your suitcase in the car. After surgery it may be brought to your room.  In case of increased patient census, it may be necessary for you, the patient, to continue your postoperative care in the Same Day Surgery department.  After surgery, you  can help prevent lung complications by doing breathing exercises.  Take deep breaths and cough every 1-2 hours. Your doctor may order a device called an Incentive Spirometer to help you take deep breaths. When coughing or sneezing, hold a pillow firmly against your incision with both hands. This is called "splinting." Doing this helps protect your incision. It also decreases belly discomfort.  Surgery Visitation Policy:  Patients undergoing a surgery or procedure may have two family members or  support persons with them as long as the person is not COVID-19 positive or experiencing its symptoms.   Inpatient Visitation:    Visiting hours are 7 a.m. to 8 p.m. Up to four visitors are allowed at one time in a patient room. The visitors may rotate out with other people during the day. One designated support person (adult) may remain overnight.  Please call the Pre-admissions Testing Dept. at [phone] if you have any questions about these instructions.     Preparing for Surgery with CHLORHEXIDINE GLUCONATE (CHG) Soap  Chlorhexidine Gluconate (CHG) Soap  o An antiseptic cleaner that kills germs and bonds with the skin to continue killing germs even after washing  o Used for showering the night before surgery and morning of surgery  Before surgery, you can play an important role by reducing the number of germs on your skin.  CHG (Chlorhexidine gluconate) soap is an antiseptic cleanser which kills germs and bonds with the skin to continue killing germs even after washing.  Please do not use if you have an allergy to CHG or antibacterial soaps. If your skin becomes reddened/irritated stop using the CHG.  1. Shower the NIGHT BEFORE SURGERY and the MORNING OF SURGERY with CHG soap.  2. If you choose to wash your hair, wash your hair first as usual with your normal shampoo.  3. After shampooing, rinse your hair and body thoroughly to remove the shampoo.  4. Use CHG as you would any other liquid soap. You can apply CHG directly to the skin and wash gently with a scrungie or a clean washcloth.  5. Apply the CHG soap to your body only from the neck down. Do not use on open wounds or open sores. Avoid contact with your eyes, ears, mouth, and genitals (private parts). Wash face and genitals (private parts) with your normal soap.  6. Wash thoroughly, paying special attention to the area where your surgery will be performed.  7. Thoroughly rinse your body with warm water.  8. Do  not shower/wash with your normal soap after using and rinsing off the CHG soap.  9. Pat yourself dry with a clean towel.  10. Wear clean pajamas to bed the night before surgery.  12. Place clean sheets on your bed the night of your first shower and do not sleep with pets.  13. Shower again with the CHG soap on the day of surgery prior to arriving at the hospital.  14. Do not apply any deodorants/lotions/powders.  15. Please wear clean clothes to the hospital.

## 2023-05-01 ENCOUNTER — Encounter: Payer: Medicaid Other | Admitting: Obstetrics and Gynecology

## 2023-05-01 ENCOUNTER — Other Ambulatory Visit: Payer: Self-pay | Admitting: Obstetrics and Gynecology

## 2023-05-01 NOTE — Addendum Note (Signed)
Addended by: Elonda Husky on: 05/01/2023 09:00 AM   Modules accepted: Orders

## 2023-05-02 ENCOUNTER — Encounter: Payer: Medicaid Other | Admitting: Certified Nurse Midwife

## 2023-05-03 ENCOUNTER — Encounter: Payer: Self-pay | Admitting: Obstetrics and Gynecology

## 2023-05-03 ENCOUNTER — Encounter: Payer: Medicaid Other | Admitting: Obstetrics and Gynecology

## 2023-05-03 ENCOUNTER — Ambulatory Visit (INDEPENDENT_AMBULATORY_CARE_PROVIDER_SITE_OTHER): Payer: Medicaid Other | Admitting: Obstetrics and Gynecology

## 2023-05-03 VITALS — BP 111/76 | HR 97 | Wt 182.9 lb

## 2023-05-03 DIAGNOSIS — Z6791 Unspecified blood type, Rh negative: Secondary | ICD-10-CM

## 2023-05-03 DIAGNOSIS — O0993 Supervision of high risk pregnancy, unspecified, third trimester: Secondary | ICD-10-CM

## 2023-05-03 DIAGNOSIS — O09899 Supervision of other high risk pregnancies, unspecified trimester: Secondary | ICD-10-CM

## 2023-05-03 DIAGNOSIS — O26899 Other specified pregnancy related conditions, unspecified trimester: Secondary | ICD-10-CM

## 2023-05-03 DIAGNOSIS — Z3A38 38 weeks gestation of pregnancy: Secondary | ICD-10-CM

## 2023-05-03 DIAGNOSIS — Z3480 Encounter for supervision of other normal pregnancy, unspecified trimester: Secondary | ICD-10-CM

## 2023-05-03 LAB — POCT URINALYSIS DIPSTICK OB
Bilirubin, UA: NEGATIVE
Blood, UA: NEGATIVE
Glucose, UA: NEGATIVE
Ketones, UA: NEGATIVE
Leukocytes, UA: NEGATIVE
Nitrite, UA: NEGATIVE
POC,PROTEIN,UA: NEGATIVE
Spec Grav, UA: 1.01 (ref 1.010–1.025)
Urobilinogen, UA: 0.2 E.U./dL
pH, UA: 6.5 (ref 5.0–8.0)

## 2023-05-03 NOTE — Progress Notes (Signed)
Sandra Wilkerson. Patient states daily fetal movement along with occasional pressure. She is ready for her upcoming cesarean. She states no questions or concerns at this time.

## 2023-05-03 NOTE — Progress Notes (Signed)
ROB: She reports no problems.  She is prepared for her cesarean delivery on Monday.  She has no further questions. Abdominal examination reveals a large midline scar and a lower transverse scar from previous cesarean.  Significant keloid present.  We have briefly discussed this. N.p.o. after midnight discussed.

## 2023-05-04 ENCOUNTER — Encounter
Admission: RE | Admit: 2023-05-04 | Discharge: 2023-05-04 | Disposition: A | Discharge status: Home / Self Care | Payer: Medicaid Other | Source: Ambulatory Visit | Attending: Obstetrics and Gynecology | Admitting: Obstetrics and Gynecology

## 2023-05-04 ENCOUNTER — Inpatient Hospital Stay: Admission: RE | Admit: 2023-05-04 | Payer: Medicaid Other | Source: Ambulatory Visit

## 2023-05-04 DIAGNOSIS — O0993 Supervision of high risk pregnancy, unspecified, third trimester: Secondary | ICD-10-CM | POA: Diagnosis not present

## 2023-05-04 DIAGNOSIS — Z01812 Encounter for preprocedural laboratory examination: Secondary | ICD-10-CM | POA: Insufficient documentation

## 2023-05-04 LAB — CBC
HCT: 33 % — ABNORMAL LOW (ref 36.0–46.0)
Hemoglobin: 10.3 g/dL — ABNORMAL LOW (ref 12.0–15.0)
MCH: 23.6 pg — ABNORMAL LOW (ref 26.0–34.0)
MCHC: 31.2 g/dL (ref 30.0–36.0)
MCV: 75.7 fL — ABNORMAL LOW (ref 80.0–100.0)
Platelets: 311 10*3/uL (ref 150–400)
RBC: 4.36 MIL/uL (ref 3.87–5.11)
WBC: 8.4 10*3/uL (ref 4.0–10.5)
nRBC: 0 % (ref 0.0–0.2)

## 2023-05-04 LAB — TYPE AND SCREEN
ABO/RH(D): O NEG
Antibody Screen: NEGATIVE
Extend sample reason: UNDETERMINED

## 2023-05-07 ENCOUNTER — Encounter: Payer: Self-pay | Admitting: Obstetrics and Gynecology

## 2023-05-07 ENCOUNTER — Encounter
Admission: RE | Disposition: A | Discharge status: Home / Self Care | Payer: Self-pay | Source: Home / Self Care | Attending: Obstetrics and Gynecology

## 2023-05-07 ENCOUNTER — Inpatient Hospital Stay
Admission: RE | Admit: 2023-05-07 | Discharge: 2023-05-10 | DRG: 787 | Disposition: A | Discharge status: Home / Self Care | Payer: Medicaid Other | Attending: Obstetrics and Gynecology | Admitting: Obstetrics and Gynecology

## 2023-05-07 ENCOUNTER — Inpatient Hospital Stay: Payer: Self-pay | Admitting: Anesthesiology

## 2023-05-07 ENCOUNTER — Other Ambulatory Visit: Payer: Self-pay

## 2023-05-07 DIAGNOSIS — O26899 Other specified pregnancy related conditions, unspecified trimester: Secondary | ICD-10-CM

## 2023-05-07 DIAGNOSIS — O9081 Anemia of the puerperium: Secondary | ICD-10-CM | POA: Diagnosis not present

## 2023-05-07 DIAGNOSIS — Z9889 Other specified postprocedural states: Secondary | ICD-10-CM

## 2023-05-07 DIAGNOSIS — Z23 Encounter for immunization: Secondary | ICD-10-CM | POA: Diagnosis not present

## 2023-05-07 DIAGNOSIS — D62 Acute posthemorrhagic anemia: Secondary | ICD-10-CM | POA: Diagnosis not present

## 2023-05-07 DIAGNOSIS — O34211 Maternal care for low transverse scar from previous cesarean delivery: Principal | ICD-10-CM | POA: Diagnosis present

## 2023-05-07 DIAGNOSIS — Z933 Colostomy status: Secondary | ICD-10-CM

## 2023-05-07 DIAGNOSIS — Z3A39 39 weeks gestation of pregnancy: Secondary | ICD-10-CM | POA: Diagnosis not present

## 2023-05-07 DIAGNOSIS — O26893 Other specified pregnancy related conditions, third trimester: Secondary | ICD-10-CM | POA: Diagnosis present

## 2023-05-07 DIAGNOSIS — O09899 Supervision of other high risk pregnancies, unspecified trimester: Secondary | ICD-10-CM

## 2023-05-07 DIAGNOSIS — Z8616 Personal history of COVID-19: Secondary | ICD-10-CM | POA: Diagnosis not present

## 2023-05-07 DIAGNOSIS — Z6791 Unspecified blood type, Rh negative: Secondary | ICD-10-CM | POA: Diagnosis not present

## 2023-05-07 DIAGNOSIS — O99214 Obesity complicating childbirth: Secondary | ICD-10-CM | POA: Diagnosis present

## 2023-05-07 DIAGNOSIS — O9903 Anemia complicating the puerperium: Secondary | ICD-10-CM

## 2023-05-07 DIAGNOSIS — Z3490 Encounter for supervision of normal pregnancy, unspecified, unspecified trimester: Secondary | ICD-10-CM

## 2023-05-07 DIAGNOSIS — Z3480 Encounter for supervision of other normal pregnancy, unspecified trimester: Principal | ICD-10-CM

## 2023-05-07 LAB — TYPE AND SCREEN
ABO/RH(D): O NEG
Antibody Screen: NEGATIVE

## 2023-05-07 SURGERY — Surgical Case
Anesthesia: Spinal

## 2023-05-07 MED ORDER — TRANEXAMIC ACID-NACL 1000-0.7 MG/100ML-% IV SOLN
INTRAVENOUS | Status: AC
  Filled 2023-05-07: qty 100

## 2023-05-07 MED ORDER — OXYTOCIN-SODIUM CHLORIDE 30-0.9 UT/500ML-% IV SOLN
INTRAVENOUS | Status: DC | PRN
  Administered 2023-05-07: 300 mL via INTRAVENOUS

## 2023-05-07 MED ORDER — LIDOCAINE 5 % EX PTCH
MEDICATED_PATCH | CUTANEOUS | Status: AC
  Filled 2023-05-07: qty 1

## 2023-05-07 MED ORDER — POVIDONE-IODINE 10 % EX SWAB
2.0000 | Freq: Once | CUTANEOUS | Status: AC
  Administered 2023-05-07: 2 via TOPICAL

## 2023-05-07 MED ORDER — MORPHINE SULFATE (PF) 0.5 MG/ML IJ SOLN
INTRAMUSCULAR | Status: AC
  Filled 2023-05-07: qty 10

## 2023-05-07 MED ORDER — LACTATED RINGERS IV SOLN: INTRAVENOUS | Status: DC

## 2023-05-07 MED ORDER — PHENYLEPHRINE HCL-NACL 20-0.9 MG/250ML-% IV SOLN
INTRAVENOUS | Status: AC
  Filled 2023-05-07: qty 250

## 2023-05-07 MED ORDER — SIMETHICONE 80 MG PO CHEW
80.0000 mg | CHEWABLE_TABLET | Freq: Four times a day (QID) | ORAL | Status: DC
  Administered 2023-05-07 – 2023-05-10 (×12): 80 mg via ORAL
  Filled 2023-05-07 (×12): qty 1

## 2023-05-07 MED ORDER — OXYCODONE HCL 5 MG PO TABS
5.0000 mg | ORAL_TABLET | Freq: Four times a day (QID) | ORAL | Status: DC | PRN
  Administered 2023-05-07 – 2023-05-10 (×10): 5 mg via ORAL
  Filled 2023-05-07 (×10): qty 1

## 2023-05-07 MED ORDER — SCOPOLAMINE 1 MG/3DAYS TD PT72: 1.0000 | MEDICATED_PATCH | Freq: Once | TRANSDERMAL | Status: DC

## 2023-05-07 MED ORDER — DIPHENHYDRAMINE HCL 25 MG PO CAPS: 25.0000 mg | ORAL_CAPSULE | ORAL | Status: DC | PRN

## 2023-05-07 MED ORDER — FENTANYL CITRATE (PF) 100 MCG/2ML IJ SOLN
INTRAMUSCULAR | Status: DC | PRN
  Administered 2023-05-07: 15 ug via INTRATHECAL

## 2023-05-07 MED ORDER — PRENATAL MULTIVITAMIN CH
1.0000 | ORAL_TABLET | Freq: Every day | ORAL | Status: DC
  Administered 2023-05-07 – 2023-05-10 (×4): 1 via ORAL
  Filled 2023-05-07 (×4): qty 1

## 2023-05-07 MED ORDER — ZOLPIDEM TARTRATE 5 MG PO TABS: 5.0000 mg | ORAL_TABLET | Freq: Every evening | ORAL | Status: DC | PRN

## 2023-05-07 MED ORDER — BUPIVACAINE IN DEXTROSE 0.75-8.25 % IT SOLN
INTRATHECAL | Status: DC | PRN
  Administered 2023-05-07: 1.4 mL via INTRATHECAL

## 2023-05-07 MED ORDER — NALOXONE HCL 4 MG/10ML IJ SOLN: 1.0000 ug/kg/h | INTRAVENOUS | Status: DC | PRN

## 2023-05-07 MED ORDER — LACTATED RINGERS IV SOLN: Freq: Once | INTRAVENOUS | Status: DC

## 2023-05-07 MED ORDER — NALOXONE HCL 0.4 MG/ML IJ SOLN: 0.4000 mg | INTRAMUSCULAR | Status: DC | PRN

## 2023-05-07 MED ORDER — DEXAMETHASONE SODIUM PHOSPHATE 4 MG/ML IJ SOLN
INTRAMUSCULAR | Status: DC | PRN
  Administered 2023-05-07: 8 mg via INTRAVENOUS

## 2023-05-07 MED ORDER — PHENYLEPHRINE HCL-NACL 20-0.9 MG/250ML-% IV SOLN
INTRAVENOUS | Status: DC | PRN
  Administered 2023-05-07: 50 ug/min via INTRAVENOUS

## 2023-05-07 MED ORDER — OXYCODONE-ACETAMINOPHEN 5-325 MG PO TABS: 1.0000 | ORAL_TABLET | ORAL | Status: DC | PRN

## 2023-05-07 MED ORDER — MORPHINE SULFATE (PF) 0.5 MG/ML IJ SOLN
INTRAMUSCULAR | Status: DC | PRN
  Administered 2023-05-07: .1 mg via INTRATHECAL

## 2023-05-07 MED ORDER — ACETAMINOPHEN 500 MG PO TABS
1000.0000 mg | ORAL_TABLET | Freq: Four times a day (QID) | ORAL | Status: AC
  Administered 2023-05-07 – 2023-05-08 (×4): 1000 mg via ORAL
  Filled 2023-05-07 (×4): qty 2

## 2023-05-07 MED ORDER — METHYLERGONOVINE MALEATE 0.2 MG/ML IJ SOLN
INTRAMUSCULAR | Status: AC
  Filled 2023-05-07: qty 1

## 2023-05-07 MED ORDER — SODIUM CHLORIDE 0.9 % IV SOLN
INTRAVENOUS | Status: AC
  Filled 2023-05-07: qty 5

## 2023-05-07 MED ORDER — IBUPROFEN 600 MG PO TABS: 600.0000 mg | ORAL_TABLET | Freq: Four times a day (QID) | ORAL | Status: DC

## 2023-05-07 MED ORDER — MENTHOL 3 MG MT LOZG: 1.0000 | LOZENGE | OROMUCOSAL | Status: DC | PRN

## 2023-05-07 MED ORDER — DIPHENHYDRAMINE HCL 25 MG PO CAPS
25.0000 mg | ORAL_CAPSULE | Freq: Four times a day (QID) | ORAL | Status: DC | PRN
  Administered 2023-05-07 – 2023-05-08 (×2): 25 mg via ORAL
  Filled 2023-05-07 (×2): qty 1

## 2023-05-07 MED ORDER — KETOROLAC TROMETHAMINE 30 MG/ML IJ SOLN
30.0000 mg | Freq: Four times a day (QID) | INTRAMUSCULAR | Status: AC | PRN
  Administered 2023-05-07 – 2023-05-08 (×3): 30 mg via INTRAVENOUS
  Filled 2023-05-07 (×4): qty 1

## 2023-05-07 MED ORDER — FENTANYL CITRATE (PF) 100 MCG/2ML IJ SOLN
INTRAMUSCULAR | Status: AC
  Filled 2023-05-07: qty 2

## 2023-05-07 MED ORDER — OXYTOCIN-SODIUM CHLORIDE 30-0.9 UT/500ML-% IV SOLN
INTRAVENOUS | Status: AC
  Filled 2023-05-07: qty 500

## 2023-05-07 MED ORDER — KETOROLAC TROMETHAMINE 30 MG/ML IJ SOLN: 30.0000 mg | Freq: Four times a day (QID) | INTRAMUSCULAR | Status: AC | PRN

## 2023-05-07 MED ORDER — SODIUM CHLORIDE 0.9% FLUSH: 3.0000 mL | INTRAVENOUS | Status: DC | PRN

## 2023-05-07 MED ORDER — OXYTOCIN-SODIUM CHLORIDE 30-0.9 UT/500ML-% IV SOLN
2.5000 [IU]/h | INTRAVENOUS | Status: AC
  Administered 2023-05-07: 2.5 [IU]/h via INTRAVENOUS

## 2023-05-07 MED ORDER — SENNOSIDES-DOCUSATE SODIUM 8.6-50 MG PO TABS
2.0000 | ORAL_TABLET | ORAL | Status: DC
  Administered 2023-05-07 – 2023-05-09 (×2): 2 via ORAL
  Filled 2023-05-07 (×3): qty 2

## 2023-05-07 MED ORDER — ONDANSETRON HCL 4 MG/2ML IJ SOLN: 4.0000 mg | Freq: Three times a day (TID) | INTRAMUSCULAR | Status: DC | PRN

## 2023-05-07 MED ORDER — KETOROLAC TROMETHAMINE 30 MG/ML IJ SOLN
INTRAMUSCULAR | Status: DC | PRN
  Administered 2023-05-07: 30 mg via INTRAVENOUS

## 2023-05-07 MED ORDER — DIPHENHYDRAMINE HCL 50 MG/ML IJ SOLN: 12.5000 mg | INTRAMUSCULAR | Status: DC | PRN

## 2023-05-07 MED ORDER — CEFAZOLIN SODIUM-DEXTROSE 2-4 GM/100ML-% IV SOLN
2.0000 g | INTRAVENOUS | Status: AC
  Administered 2023-05-07: 2 g via INTRAVENOUS
  Filled 2023-05-07: qty 100

## 2023-05-07 MED ORDER — SOD CITRATE-CITRIC ACID 500-334 MG/5ML PO SOLN
ORAL | Status: AC
  Administered 2023-05-07: 30 mL
  Filled 2023-05-07: qty 15

## 2023-05-07 MED ORDER — MEPERIDINE HCL 25 MG/ML IJ SOLN: 6.2500 mg | INTRAMUSCULAR | Status: DC | PRN

## 2023-05-07 MED ORDER — ONDANSETRON HCL 4 MG/2ML IJ SOLN
INTRAMUSCULAR | Status: DC | PRN
  Administered 2023-05-07: 4 mg via INTRAVENOUS

## 2023-05-07 SURGICAL SUPPLY — 34 items
ADH LQ OCL WTPRF AMP STRL LF (MISCELLANEOUS) ×1
ADHESIVE MASTISOL STRL (MISCELLANEOUS) ×1 IMPLANT
APL PRP STRL LF DISP 70% ISPRP (MISCELLANEOUS) ×2
APL SKNCLS STERI-STRIP NONHPOA (GAUZE/BANDAGES/DRESSINGS) ×1
BAG COUNTER SPONGE SURGICOUNT (BAG) ×1 IMPLANT
BAG SPNG CNTER NS LX DISP (BAG) ×1
BENZOIN TINCTURE PRP APPL 2/3 (GAUZE/BANDAGES/DRESSINGS) IMPLANT
BNDG TENSOPLAST 6X5 (GAUZE/BANDAGES/DRESSINGS) IMPLANT
CHLORAPREP W/TINT 26 (MISCELLANEOUS) ×2 IMPLANT
CLSR STERI-STRIP ANTIMIC 1/2X4 (GAUZE/BANDAGES/DRESSINGS) IMPLANT
DRSG TELFA 3X8 NADH (GAUZE/BANDAGES/DRESSINGS) ×1
DRSG TELFA 3X8 NADH STRL (GAUZE/BANDAGES/DRESSINGS) ×1 IMPLANT
GAUZE SPONGE 4X4 12PLY STRL (GAUZE/BANDAGES/DRESSINGS) ×1 IMPLANT
GLOVE PI ORTHO PRO STRL 7.5 (GLOVE) ×1 IMPLANT
GOWN STRL REUS W/ TWL LRG LVL3 (GOWN DISPOSABLE) ×2 IMPLANT
GOWN STRL REUS W/TWL LRG LVL3 (GOWN DISPOSABLE) ×2
KIT TURNOVER KIT A (KITS) ×1 IMPLANT
MANIFOLD NEPTUNE II (INSTRUMENTS) ×1 IMPLANT
MAT PREVALON FULL STRYKER (MISCELLANEOUS) ×1 IMPLANT
NS IRRIG 1000ML POUR BTL (IV SOLUTION) ×1 IMPLANT
PACK C SECTION AR (MISCELLANEOUS) ×1 IMPLANT
PAD DRESSING TELFA 3X8 NADH (GAUZE/BANDAGES/DRESSINGS) IMPLANT
PAD OB MATERNITY 4.3X12.25 (PERSONAL CARE ITEMS) ×1 IMPLANT
PAD PREP OB/GYN DISP 24X41 (PERSONAL CARE ITEMS) ×1 IMPLANT
RETRACTOR WND ALEXIS-O 25 LRG (MISCELLANEOUS) ×1 IMPLANT
RTRCTR WOUND ALEXIS O 25CM LRG (MISCELLANEOUS) ×1
SCRUB CHG 4% DYNA-HEX 4OZ (MISCELLANEOUS) ×1 IMPLANT
SPONGE T-LAP 18X18 ~~LOC~~+RFID (SPONGE) ×1 IMPLANT
SUT VIC AB 0 CTX 36 (SUTURE) ×2
SUT VIC AB 0 CTX36XBRD ANBCTRL (SUTURE) ×2 IMPLANT
SUT VIC AB 1 CT1 36 (SUTURE) ×2 IMPLANT
SUT VICRYL+ 3-0 36IN CT-1 (SUTURE) ×2 IMPLANT
TRAP FLUID SMOKE EVACUATOR (MISCELLANEOUS) ×1 IMPLANT
WATER STERILE IRR 500ML POUR (IV SOLUTION) ×1 IMPLANT

## 2023-05-07 NOTE — Op Note (Signed)
     OP NOTE  Date: 05/07/2023   9:18 AM Name Sandra Wilkerson MR# [id number]  Preoperative Diagnosis: 1. Intrauterine pregnancy at [redacted]w[redacted]d Active Problems:   * No active hospital problems. *  2.  Patient desires repeat  Postoperative Diagnosis: 1. Intrauterine pregnancy at [redacted]w[redacted]d, delivered 2. Viable infant 3. Remainder same as pre-op   Procedure: 1. Repeat Low-Transverse Cesarean Section  Surgeon: Elonda Husky, MD  Assistant:  Valentino Saxon MD   No other capable assistant was available for this surgery which requires an experienced, high level assistant.   Anesthesia: Spinal   EBL: 800 ml     Findings: 1) female infant, Apgar scores of    at 1 minute and    at 5 minutes and a birthweight of   ounces.    2) Normal uterus, tubes and ovaries.    Procedure:  The patient was prepped and draped in the supine position and placed under spinal anesthesia.  A transverse incision was made across the abdomen in a Pfannenstiel manner. If indicated the old scar was systematically removed with sharp dissection.  We carried the dissection down to the level of the fascia.  The fascia was incised in a curvilinear manner.  The fascia was then elevated from the rectus muscles with blunt and sharp dissection.  The rectus muscles were separated laterally exposing the peritoneum.  The peritoneum was carefully entered with care being taken to avoid bowel and bladder.  A self-retaining retractor was placed.  The visceral peritoneum was incised in a curvilinear fashion across the lower uterine segment creating a bladder flap. A transverse incision was made across the lower uterine segment and extended laterally and superiorly with blunt dissection.  Artificial rupture membranes was performed and Clear fluid was noted.  The infant was delivered from the cephalic position.  A nuchal cord was not present. After an appropriate time interval, the cord was doubly clamped and cut. Cord blood was obtained if  required.  The infant was handed to the pediatric personnel  who then placed the infant under heat lamps where it was cleaned dried and suctioned as needed. The placenta was delivered. The hysterotomy incision was then identified on ring forceps.  The uterine cavity was cleaned with a moist lap sponge.  The hysterotomy incision was closed with a running interlocking suture of Vicryl.  Hemostasis was excellent.  Pitocin was run in the IV and the uterus was found to be firm. The posterior cul-de-sac and gutters were cleaned and inspected.  Hemostasis was noted.  The fascia was then closed with a running suture of #1 Vicryl.  Hemostasis of the subcutaneous tissues was obtained using the Bovie.  The subcutaneous tissues were closed with a running suture of 000 Vicryl.  A subcuticular suture was placed.  Steri-strips were applied in the usual manner.  A Lidoderm patch was applied.  A pressure dressing was placed.  The patient went to the recovery room in stable condition. Dr. Valentino Saxon provided exposure, dissection, suctioning, retraction, and general support and assistance during the procedure.   Elonda Husky, M.D. 05/07/2023 9:18 AM

## 2023-05-07 NOTE — Transfer of Care (Signed)
Immediate Anesthesia Transfer of Care Note  Patient: Sandra Wilkerson  Procedure(s) Performed: REPEAT CESAREAN DELIVERY  Patient Location:  [id number]  Anesthesia Type:Spinal  Level of Consciousness: awake, alert , and oriented  Airway & Oxygen Therapy: Patient Spontanous Breathing  Post-op Assessment: Report given to RN and Post -op Vital signs reviewed and stable  Post vital signs: Reviewed and stable  Last Vitals:  Vitals Value Taken Time  BP 117/76   Temp    Pulse 72   Resp 12   SpO2 98     Last Pain:  Vitals:   05/07/23 0718  TempSrc: Oral  PainSc: 0-No pain      Patients Stated Pain Goal: 0 (05/07/23 0651)  Complications: No notable events documented.

## 2023-05-07 NOTE — Anesthesia Procedure Notes (Signed)
Spinal  Patient location during procedure: OR Start time: 05/07/2023 8:02 AM End time: 05/07/2023 8:04 AM Reason for block: surgical anesthesia Staffing Performed: resident/CRNA  Anesthesiologist: Foye Deer, MD Resident/CRNA: Irving Burton, CRNA Performed by: Irving Burton, CRNA Authorized by: Foye Deer, MD   Preanesthetic Checklist Completed: patient identified, IV checked, site marked, risks and benefits discussed, surgical consent, monitors and equipment checked, pre-op evaluation and timeout performed Spinal Block Patient position: sitting Prep: ChloraPrep Patient monitoring: heart rate, continuous pulse ox, blood pressure and cardiac monitor Approach: midline Location: L3-4 Injection technique: single-shot Needle Needle type: Introducer and Pencan  Needle gauge: 25 G Needle length: 9 cm Assessment Sensory level: T10 Events: CSF return Additional Notes Sterile aseptic technique used throughout the procedure.  Negative paresthesia. Negative blood return. Positive free-flowing CSF. Expiration date of kit checked and confirmed. Patient tolerated procedure well, without complications.

## 2023-05-07 NOTE — Anesthesia Preprocedure Evaluation (Addendum)
Anesthesia Evaluation  Patient identified by MRN, date of birth, ID band Patient awake    Reviewed: Allergy & Precautions, H&P , NPO status , Patient's Chart, lab work & pertinent test results  Airway Mallampati: II  TM Distance: >3 FB Neck ROM: full    Dental no notable dental hx.    Pulmonary asthma    Pulmonary exam normal        Cardiovascular Exercise Tolerance: Good negative cardio ROS Normal cardiovascular exam     Neuro/Psych    GI/Hepatic Crohn disease Status post colostomy   Endo/Other    Renal/GU   negative genitourinary   Musculoskeletal   Abdominal gravid  Peds  Hematology  (+) Blood dyscrasia, anemia   Anesthesia Other Findings Past Medical History: No date: Anemia No date: COVID No date: Crohn disease (HCC) No date: PCOS (polycystic ovarian syndrome)  Past Surgical History: No date: APPENDECTOMY 07/15/2021: CESAREAN SECTION     Comment:  Procedure: CESAREAN SECTION;  Surgeon: Linzie Collin, MD;  Location: [facility] ORS;  Service: Obstetrics;; No date: CHOLECYSTECTOMY No date: COLECTOMY No date: COLOSTOMY No date: COLOSTOMY REVERSAL 11/07/2021: ERCP; N/A     Comment:  Procedure: ENDOSCOPIC RETROGRADE               CHOLANGIOPANCREATOGRAPHY (ERCP);  Surgeon: Midge Minium,               MD;  Location: [facility] ENDOSCOPY;  Service: Endoscopy;                Laterality: N/A;  BMI    Body Mass Index: 36.94 kg/m      Reproductive/Obstetrics (+) Pregnancy                             Anesthesia Physical Anesthesia Plan  ASA: 2  Anesthesia Plan: Spinal   Post-op Pain Management:    Induction:   PONV Risk Score and Plan: Ondansetron  Airway Management Planned:   Additional Equipment:   Intra-op Plan:   Post-operative Plan:   Informed Consent: I have reviewed the patients History and Physical, chart, labs and discussed the procedure  including the risks, benefits and alternatives for the proposed anesthesia with the patient or authorized representative who has indicated his/her understanding and acceptance.     Dental Advisory Given  Plan Discussed with: Anesthesiologist and CRNA  Anesthesia Plan Comments:         Anesthesia Quick Evaluation

## 2023-05-08 ENCOUNTER — Encounter: Payer: Self-pay | Admitting: Obstetrics and Gynecology

## 2023-05-08 LAB — FETAL SCREEN: Fetal Screen: NEGATIVE

## 2023-05-08 LAB — RHOGAM INJECTION: Unit division: 0

## 2023-05-08 MED ORDER — IBUPROFEN 600 MG PO TABS
600.0000 mg | ORAL_TABLET | Freq: Four times a day (QID) | ORAL | Status: DC
  Administered 2023-05-08 – 2023-05-10 (×7): 600 mg via ORAL
  Filled 2023-05-08 (×8): qty 1

## 2023-05-08 MED ORDER — LORATADINE 10 MG PO TABS
10.0000 mg | ORAL_TABLET | Freq: Every day | ORAL | Status: DC
  Administered 2023-05-08 – 2023-05-09 (×2): 10 mg via ORAL
  Filled 2023-05-08 (×3): qty 1

## 2023-05-08 MED ORDER — SODIUM CHLORIDE 0.9 % IV SOLN
300.0000 mg | Freq: Once | INTRAVENOUS | Status: AC
  Administered 2023-05-08: 300 mg via INTRAVENOUS
  Filled 2023-05-08: qty 300

## 2023-05-08 MED ORDER — ACETAMINOPHEN 500 MG PO TABS
1000.0000 mg | ORAL_TABLET | Freq: Four times a day (QID) | ORAL | Status: DC | PRN
  Administered 2023-05-08 – 2023-05-10 (×7): 1000 mg via ORAL
  Filled 2023-05-08 (×7): qty 2

## 2023-05-08 NOTE — Anesthesia Post-op Follow-up Note (Signed)
  Anesthesia Pain Follow-up Note  Patient: Sandra Wilkerson  Day #: 1  Date of Follow-up: 05/08/2023 Time: 7:47 AM  Last Vitals:  Vitals:   05/08/23 0500 05/08/23 0600  BP:    Pulse: 84 69  Resp:    Temp:    SpO2: 92% 99%    Level of Consciousness: alert  Pain: none   Side Effects:None  Catheter Site Exam:clean, dry     Plan: D/C from anesthesia care at surgeon's request  Anysia Choi

## 2023-05-08 NOTE — Progress Notes (Signed)
Progress Note - Cesarean Delivery  Sandra Wilkerson is a 27 y.o. G2P2002 now PP day 1 s/p C-Section, Low Transverse.   Subjective:  Patient reports no problems with eating, bowel movements, voiding, or their wound  Pain controlled.  OOB without lightheadedness  Objective:  Vital signs in last 24 hours: Temp:  [97.7 F (36.5 C)-98.2 F (36.8 C)] 98.2 F (36.8 C) (07/29 2356) Pulse Rate:  [57-118] 69 (07/30 0600) Resp:  [14-22] 20 (07/29 2356) BP: (101-125)/(64-88) 121/73 (07/29 2356) SpO2:  [92 %-100 %] 99 % (07/30 0600)  Physical Exam:  General: cooperative and no distress Lochia: appropriate Uterine Fundus: firm Incision: Dressing intact    Data Review Recent Labs    05/08/23 0550  HGB 8.5*  HCT 27.6*    Assessment:  Principal Problem:   Post-operative state Active Problems:   Term pregnancy, repeat   Status post Cesarean section. Doing well postoperatively.   Mild anemia - asymptomatic  Plan:       Continue current care.  Plan iron infusion today  Prob discharge tomorrow  Elonda Husky, M.D. 05/08/2023 8:20 AM

## 2023-05-08 NOTE — Anesthesia Postprocedure Evaluation (Signed)
Anesthesia Post Note  Patient: Sandra Wilkerson  Procedure(s) Performed: REPEAT CESAREAN DELIVERY  Patient location during evaluation: Mother Baby Anesthesia Type: Spinal Level of consciousness: oriented and awake and alert Pain management: pain level controlled Vital Signs Assessment: post-procedure vital signs reviewed and stable Respiratory status: spontaneous breathing and respiratory function stable Cardiovascular status: blood pressure returned to baseline and stable Postop Assessment: no headache, no backache, no apparent nausea or vomiting and able to ambulate Anesthetic complications: no   No notable events documented.   Last Vitals:  Vitals:   05/08/23 0500 05/08/23 0600  BP:    Pulse: 84 69  Resp:    Temp:    SpO2: 92% 99%    Last Pain:  Vitals:   05/08/23 0600  TempSrc:   PainSc: 5                  Reeda Soohoo

## 2023-05-09 ENCOUNTER — Encounter: Payer: Medicaid Other | Admitting: Licensed Practical Nurse

## 2023-05-09 MED ORDER — RHO D IMMUNE GLOBULIN 1500 UNIT/2ML IJ SOSY
300.0000 ug | PREFILLED_SYRINGE | Freq: Once | INTRAMUSCULAR | Status: AC
  Administered 2023-05-09: 300 ug via INTRAMUSCULAR
  Filled 2023-05-09: qty 2

## 2023-05-09 NOTE — Progress Notes (Signed)
Subjective: Postpartum Day 2: Cesarean Delivery Patient reports tolerating PO, + flatus, and no problems voiding.  She is ambulating well Her baby is in the NICU.  Objective: Vital signs in last 24 hours: Temp:  [98.5 F (36.9 C)-99 F (37.2 C)] 99 F (37.2 C) (07/31 0759) Pulse Rate:  [78-92] 89 (07/31 0759) Resp:  [17-18] 18 (07/31 0759) BP: (125-126)/(78-87) 125/85 (07/31 0759) SpO2:  [98 %-100 %] 98 % (07/31 0759)  Physical Exam:  General: alert, cooperative, no distress, and moderately obese Lochia: appropriate Uterine Fundus: firm Incision: healing well, no significant drainage DVT Evaluation: No evidence of DVT seen on physical exam. Negative Homan's sign. No cords or calf tenderness.  Recent Labs    05/08/23 0550  HGB 8.5*  HCT 27.6*    Assessment/Plan: Status post Cesarean section. Doing well postoperatively.  Continue current care.  Mirna Mires, CNM 05/09/2023, 10:46 AM

## 2023-05-10 ENCOUNTER — Other Ambulatory Visit: Payer: Self-pay

## 2023-05-10 MED ORDER — ACETAMINOPHEN 500 MG PO TABS
1000.0000 mg | ORAL_TABLET | Freq: Four times a day (QID) | ORAL | 0 refills | Status: DC | PRN
  Filled 2023-05-10: qty 100, 13d supply, fill #0

## 2023-05-10 MED ORDER — SENNOSIDES-DOCUSATE SODIUM 8.6-50 MG PO TABS
2.0000 | ORAL_TABLET | Freq: Every evening | ORAL | 0 refills | Status: DC | PRN
  Filled 2023-05-10: qty 100, 50d supply, fill #0

## 2023-05-10 MED ORDER — IBUPROFEN 600 MG PO TABS
600.0000 mg | ORAL_TABLET | Freq: Four times a day (QID) | ORAL | Status: DC
  Administered 2023-05-10: 600 mg via ORAL
  Filled 2023-05-10: qty 1

## 2023-05-10 MED ORDER — OXYCODONE HCL 5 MG PO TABS
5.0000 mg | ORAL_TABLET | Freq: Four times a day (QID) | ORAL | 0 refills | Status: AC | PRN
  Filled 2023-05-10: qty 20, 5d supply, fill #0

## 2023-05-10 MED ORDER — SIMETHICONE 80 MG PO CHEW
80.0000 mg | CHEWABLE_TABLET | Freq: Four times a day (QID) | ORAL | 0 refills | Status: DC
  Filled 2023-05-10: qty 100, 25d supply, fill #0

## 2023-05-10 MED ORDER — IBUPROFEN 600 MG PO TABS
600.0000 mg | ORAL_TABLET | Freq: Four times a day (QID) | ORAL | 1 refills | Status: DC
  Filled 2023-05-10: qty 60, 15d supply, fill #0

## 2023-05-10 NOTE — Discharge Instructions (Signed)
Discharge Instructions:   If there are any new medications, they have been ordered and will be available for pickup at the listed pharmacy on your way home from the hospital.   Call office if you have any of the following: headache, visual changes, fever >101.0 F, chills, shortness of breath, breast concerns, excessive vaginal bleeding, incision drainage or problems, leg pain or redness, depression or any other concerns. If you have vaginal discharge with an odor, let your doctor know.   It is normal to bleed for up to 6 weeks. You should not soak through more than 1 pad in 1 hour. If you have a blood clot larger than your fist with continued bleeding, call your doctor.   After a c-section, you should expect a small amount of blood or clear fluid coming from the incision and abdominal cramping/soreness. Inspect your incision site daily. Stand in front of a mirror to look for any redness, incision opening, or discolored/odorness drainage. Take a shower daily and continue good hygiene. Use own towel and washcloth (do not share). Make sure your sheets on your bed are clean. No pets sleeping around your incision site. Dressing will be removed at your postpartum visit. If the dressing does become wet or soiled underneath, it is okay to remove it.   On-Q pump: You will remove on day 5 after insertion or if the ball becomes flat before day 5. You will remove on: Feb 09, 2019  Activity: Do not lift > 10 lbs for 6 weeks (do not lift anything heavier than your baby). No intercourse, tampons, swimming pools, hot tubs, baths (only showers) for 6 weeks.  No driving for 1-2 weeks. Continue prenatal vitamin, especially if breastfeeding. Increase calories and fluids (water) while breastfeeding.   Your milk will come in, in the next couple of days (right now it is colostrum). You may have a slight fever when your milk comes in, but it should go away on its own.  If it does not, and rises above 101 F please call the  doctor. You will also feel achy and your breasts will be firm. They will also start to leak. If you are breastfeeding, continue as you have been and you can pump/express milk for comfort.   If you have too much milk, your breasts can become engorged, which could lead to mastitis. This is an infection of the milk ducts. It can be very painful and you will need to notify your doctor to obtain a prescription for antibiotics. You can also treat it with a shower or hot/cold compress.   For concerns about your baby, please call your pediatrician.  For breastfeeding concerns, the lactation consultant can be reached at [phone].   Postpartum blues (feelings of happy one minute and sad another minute) are normal for the first few weeks but if it gets worse let your doctor know.   Congratulations! We enjoyed caring for you and your new bundle of joy!  

## 2023-05-10 NOTE — Discharge Summary (Signed)
Postpartum Discharge Summary     Patient Name: Sandra Wilkerson DOB: 1996-08-25 MRN: [id number]  Date of admission: 05/07/2023 Delivery date:05/07/2023 Delivering provider: Linzie Collin Date of discharge: 05/10/2023  Admitting diagnosis: Post-operative state [Z98.890] Term pregnancy, repeat [Z34.90] Intrauterine pregnancy: [redacted]w[redacted]d     Secondary diagnosis:  Principal Problem:   Post-operative state Active Problems:   Term pregnancy, repeat  Additional problems:  Patient Active Problem List   Diagnosis Date Noted   Post-operative state 05/07/2023   Term pregnancy, repeat 05/07/2023   Headache in pregnancy, antepartum, third trimester 04/03/2023   Nausea and vomiting in pregnancy 03/30/2023   Anemia in pregnancy, third trimester 03/30/2023   Labor and delivery, indication for care 03/11/2023   Obesity in pregnancy 11/06/2022   Previous cesarean delivery affecting pregnancy 11/06/2022   Rh negative state in antepartum period 10/30/2022   Supervision of other high risk pregnancy, antepartum, unspecified trimester 10/30/2022   Supervision of other normal pregnancy, antepartum 10/05/2022   Choledocholithiasis 11/06/2021   History of infertility 12/29/2020   Obesity (BMI 30-39.9) 12/29/2020   PCO (polycystic ovaries) 12/29/2020   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   Neck pain 02/26/2018   Symptomatic mammary hypertrophy 02/26/2018   Crohn's colitis (HCC) 09/12/2017   Other headache syndrome 03/26/2017   Chest pain 03/26/2017   Esophageal reflux 03/26/2017   Asthma 09/30/2014   Anemia 09/30/2014   Keloid scar 09/19/2011   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   Peritoneal abscess (HCC) 03/13/2010   Maternal Crohn's disease affecting pregnancy in second trimester (HCC) 03/08/2004       Discharge diagnosis: Term Pregnancy Delivered                                              Post partum procedures: Iron infusion Augmentation: N/A Complications:  Hemorrhage>1065mL  Hospital course: Sceduled C/S   27 y.o. yo G2P2002 at [redacted]w[redacted]d was admitted to the hospital 05/07/2023 for scheduled cesarean section with the following indication:Elective Repeat.Delivery details are as follows:  Membrane Rupture Time/Date: 8:32 AM,05/07/2023  Delivery Method:C-Section, Low Transverse Operative Delivery:N/A Details of operation can be found in separate operative note.  Patient had a postpartum course complicated by anemia.  She is ambulating, tolerating a regular diet, passing flatus and has had a BM, and urinating well. Patient is discharged home in stable condition on  05/10/23        Newborn Data: Birth date:05/07/2023 Birth time:8:33 AM Gender:Female Living status:Living Apgars:8 ,9  Weight:3000 g    Magnesium Sulfate received: No BMZ received: No Rhophylac:Yes MMR:N/A T-DaP:Given prenatally Flu: N/A Transfusion:No  Physical exam  Vitals:   05/09/23 0759 05/09/23 1519 05/09/23 2313 05/10/23 0808  BP: 125/85 120/76 121/72 133/82  Pulse: 89 94 90 (!) 103  Resp: 18 20 18 18   Temp: 99 F (37.2 C) 99.1 F (37.3 C) 98.8 F (37.1 C)   TempSrc: Oral Oral Oral   SpO2: 98%  100% 98%  Weight:      Height:       General: alert, cooperative, and no distress Lochia: appropriate Uterine Fundus: firm Incision: Dressing is clean, dry, and intact DVT Evaluation: No evidence of DVT seen on physical exam. Labs: Lab Results  Component Value Date   WBC 14.8 (H) 05/08/2023   HGB 8.5 (L) 05/08/2023   HCT 27.6 (L) 05/08/2023   MCV 79.1 (L) 05/08/2023  PLT 273 05/08/2023      Latest Ref Rng & Units 11/15/2022    8:27 PM  CMP  Glucose 70 - 99 mg/dL 97   BUN 6 - 20 mg/dL 9   Creatinine 0.10 - 2.72 mg/dL 5.36   Sodium 644 - 034 mmol/L 132   Potassium 3.5 - 5.1 mmol/L 3.7   Chloride 98 - 111 mmol/L 103   CO2 22 - 32 mmol/L 18   Calcium 8.9 - 10.3 mg/dL 8.6    Edinburgh Score:    05/09/2023    7:56 AM  Edinburgh Postnatal Depression Scale  Screening Tool  I have been able to laugh and see the funny side of things. 0  I have looked forward with enjoyment to things. 0  I have blamed myself unnecessarily when things went wrong. 1  I have been anxious or worried for no good reason. 2  I have felt scared or panicky for no good reason. 0  Things have been getting on top of me. 0  I have been so unhappy that I have had difficulty sleeping. 0  I have felt sad or miserable. 0  I have been so unhappy that I have been crying. 0  The thought of harming myself has occurred to me. 0  Edinburgh Postnatal Depression Scale Total 3      After visit meds:  Allergies as of 05/10/2023       Reactions   Other Anaphylaxis   Sodium Ferric Gluconate        Medication List     STOP taking these medications    butalbital-acetaminophen-caffeine 50-325-40 MG tablet Commonly known as: FIORICET       TAKE these medications    acetaminophen 500 MG tablet Commonly known as: TYLENOL Take 2 tablets (1,000 mg total) by mouth every 6 (six) hours as needed for moderate pain. What changed:  medication strength how much to take reasons to take this   ferrous sulfate 325 (65 FE) MG EC tablet Take 1 tablet (325 mg total) by mouth 2 (two) times daily.   ibuprofen 600 MG tablet Commonly known as: ADVIL Take 1 tablet (600 mg total) by mouth every 6 (six) hours.   oxyCODONE 5 MG immediate release tablet Commonly known as: Oxy IR/ROXICODONE Take 1 tablet (5 mg total) by mouth every 6 (six) hours as needed for up to 5 days for severe pain.   Prenate Pixie 10-0.6-0.4-200 MG Caps Take 1 capsule by mouth daily.   REMICADE IV Inject 10 mg PE/kg into the vein every 28 (twenty-eight) days.   senna-docusate 8.6-50 MG tablet Commonly known as: Senokot-S Take 2 tablets by mouth at bedtime as needed for mild constipation.   simethicone 80 MG chewable tablet Commonly known as: MYLICON Chew 1 tablet (80 mg total) by mouth 4 (four) times  daily.               Discharge Care Instructions  (From admission, onward)           Start     Ordered   05/10/23 0000  Leave dressing on - Keep it clean, dry, and intact until clinic visit        05/10/23 0940             Discharge home in stable condition Infant Feeding: Bottle Infant Disposition:home with mother Discharge instruction: per After Visit Summary and Postpartum booklet. Activity: Advance as tolerated. Pelvic rest for 6 weeks.  Diet: routine diet Anticipated Birth Control:  IUD Postpartum Appointment:6 weeks Additional Postpartum F/U: Incision check 1 week Future Appointments: Future Appointments  Date Time Provider Department Center  05/15/2023 10:15 AM Free, Lindalou Hose, CNM [facility] None   Follow up Visit:      05/10/2023 Dominica Severin, CNM

## 2023-05-10 NOTE — Progress Notes (Signed)
Patient discharged home with infant. Discharge instructions and prescriptions given and reviewed with patient. Patient verbalized understanding. Escorted out by auxillary.  

## 2023-05-14 NOTE — Progress Notes (Unsigned)
    OBSTETRICS/GYNECOLOGY POST-OPERATIVE CLINIC VISIT  Subjective:     Sandra Wilkerson is a 27 y.o. female who presents to the clinic 1 weeks status post REPEAT CESAREAN SECTION for history of prior C-section desiring repeat . Eating a regular diet without difficulty. Bowel movements are normal. Pain is controlled with current analgesics. Medications being used: acetaminophen, ibuprofen (OTC), and narcotic analgesics including oxycodone (Oxycontin, Oxyir).  Is currently breast pumping and formula feeding. Is excited as she originally had not planned to breastfeed but notes pumping is going very well for her.   The following portions of the patient's history were reviewed and updated as appropriate: allergies, current medications, past family history, past medical history, past social history, past surgical history, and problem list.  Review of Systems A comprehensive review of systems was negative except for: Neurological: positive for headaches that persist despite medication. Does note some light sensitivity. Denies worsening pain with changing positions. Denies blurred vision, RUQ pain, or sudden swelling of extremities.    Objective:   BP 128/84   Pulse 88   Resp 16   Ht 4\' 11"  (1.499 m)   Wt 171 lb (77.6 kg)   Breastfeeding Yes   BMI 34.54 kg/m  Body mass index is 34.54 kg/m.  General:  alert and no distress  Abdomen: soft, bowel sounds active, non-tender  Incision:   healing well, no drainage, no erythema, no hernia, no seroma, no swelling, no dehiscence, incision well approximated    Pathology:  None  Assessment:   Patient s/p REPEAT CESAREAN DELIVERY.  Doing well postoperatively. Headache Lactating mother   Plan:   1. Continue any current medications as instructed by provider.   2. Advised on use of Excedrin Migraine or consuming caffeine for headache. Also encouraged adequate rest as much as possible. BPs not elevated, no significant swelling noted so not  concerned for postpartum pre-eclampsia at this time. No concerns for spinal headache as presentation does not suggest it. However if symptoms persist, advised to f/u.  3. Wound care discussed. 4. Activity restrictions: no bending, stooping, or squatting, no lifting more than 10-15 pounds, and pelvic rest. 5. Anticipated return to work:  5 weeks, if applicable . 6. Breast pumping going well. Continue to encourage.  7. Follow up: 1 week for postpartum mood check, also 5 weeks for final postpartum visit.    Hildred Laser, MD [facility]

## 2023-05-15 ENCOUNTER — Ambulatory Visit (INDEPENDENT_AMBULATORY_CARE_PROVIDER_SITE_OTHER): Payer: Medicaid Other | Admitting: Obstetrics and Gynecology

## 2023-05-15 ENCOUNTER — Telehealth: Payer: Self-pay

## 2023-05-15 ENCOUNTER — Encounter: Payer: Medicaid Other | Admitting: Obstetrics and Gynecology

## 2023-05-15 ENCOUNTER — Encounter: Payer: Self-pay | Admitting: Obstetrics and Gynecology

## 2023-05-15 VITALS — BP 128/84 | HR 88 | Resp 16 | Ht 59.0 in | Wt 171.0 lb

## 2023-05-15 DIAGNOSIS — G4489 Other headache syndrome: Secondary | ICD-10-CM

## 2023-05-15 DIAGNOSIS — Z4889 Encounter for other specified surgical aftercare: Secondary | ICD-10-CM

## 2023-05-15 NOTE — Patient Instructions (Signed)

## 2023-05-15 NOTE — Telephone Encounter (Signed)
[facility]- Discharge Call Backs-Left Voicemail about the following below. 1-Do you have any questions or concerns about yourself as you heal? 2-Any concerns or questions about your baby? 3- Reviewed ABC's of safe sleep. 4-How was your stay at the hospital? 5- Did our team work together to care for you? You should be receiving a survey in the mail soon.   We would really appreciate it if you could fill that out for us and return it in the mail.  We value the feedback to make improvements and continue the great work we do.   If you have any questions please feel free to call me back at [phone]  

## 2023-05-23 ENCOUNTER — Telehealth: Payer: Self-pay | Admitting: Certified Nurse Midwife

## 2023-05-23 ENCOUNTER — Telehealth: Payer: Medicaid Other | Admitting: Certified Nurse Midwife

## 2023-05-23 ENCOUNTER — Telehealth (INDEPENDENT_AMBULATORY_CARE_PROVIDER_SITE_OTHER): Payer: Medicaid Other | Admitting: Certified Nurse Midwife

## 2023-05-23 DIAGNOSIS — Z3009 Encounter for other general counseling and advice on contraception: Secondary | ICD-10-CM

## 2023-05-23 NOTE — Telephone Encounter (Signed)
Reached out to pt to reschedule the PP mood check MyChart video visit that was scheduled on8/14/2024 at 8:55.  Was able to reschedule the appt for 05/23/2023 at 3:35 with Robb Matar.

## 2023-05-23 NOTE — Progress Notes (Signed)
   Virtual Visit via Video Note  I connected with Sandra Wilkerson on 05/23/23 at   3:35 PM EDT by a video enabled telemedicine application and verified that I am speaking with the correct person using two identifiers.  Location: Patient: home Provider: [facility]   I discussed the limitations of evaluation and management by telemedicine and the availability of in person appointments. The patient expressed understanding and agreed to proceed.    History of Present Illness:   Sandra Wilkerson is a 27 y.o. G4P2002 female who presents for a 2 week televisit for mood check. She is 2 weeks postpartum following a repeat cesarean delivery.  The delivery was at 39 gestational weeks.  Postpartum course has been well so far. Baby is feeding by bottle-pumped breastmilk. Bleeding: scant, dark brown without odor or clots. Denies cramping or bright red bleeding. Incision site tender but without discharge or pain. Postpartum depression screening: negative.  EDPS score is 0.      The following portions of the patient's history were reviewed and updated as appropriate: allergies, current medications, past family history, past medical history, past social history, past surgical history, and problem list.   Observations/Objective:   currently breastfeeding. Gen App: NAD Psych: normal speech, affect. Good mood.        05/23/2023    3:27 PM 05/15/2023    8:45 AM 05/09/2023    7:56 AM 10/05/2022    2:31 PM 08/23/2021    3:20 PM  Edinburgh Postnatal Depression Scale Screening Tool  I have been able to laugh and see the funny side of things. 0 0 0 0 0  I have looked forward with enjoyment to things. 0 0 0 0   I have blamed myself unnecessarily when things went wrong. 0 1 1 0   I have been anxious or worried for no good reason. 0 0 2 0 2  I have felt scared or panicky for no good reason. 0 0 0 0 2  Things have been getting on top of me. 0 0 0 2 0  I have been so unhappy that I have had difficulty  sleeping. 0 0 0 0 1  I have felt sad or miserable. 0 0 0 0 0  I have been so unhappy that I have been crying. 0 0 0 0 0  The thought of harming myself has occurred to me. 0 0 0 0 0  Edinburgh Postnatal Depression Scale Total 0 1 3 2           Assessment and Plan:   1. Encounter for screening for maternal depression - Screening Negative today. Will rescreen at 6 week postpartum visit.     2. Postpartum state - Overall doing well. Continue routine postpartum home care.    3. Lactating mother - Continue pumping, reports good supply  4. Contraception - Desires Mirena IUD at [employer] visit. Counseled on placement, risks, expectations. - Refrain from IC until after IUD placed   Follow Up Instructions:     I discussed the assessment and treatment plan with the patient. The patient was provided an opportunity to ask questions and all were answered. The patient agreed with the plan and demonstrated an understanding of the instructions.   The patient was advised to call back or seek an in-person evaluation if the symptoms worsen or if the condition fails to improve as anticipated.   Dominica Severin, CNM

## 2023-05-24 NOTE — H&P (Signed)
History and Physical   HPI  Sandra Wilkerson is a 27 y.o. [id number] at [redacted]w[redacted]d Estimated Date of Delivery: 05/14/23 who is being admitted for repeat cesarean delivery.   OB History  OB History  Gravida Para Term Preterm AB Living  2 2 2  0 0 2  SAB IAB Ectopic Multiple Live Births  0 0 0 0 2    # Outcome Date GA Lbr Len/2nd Weight Sex Type Anes PTL Lv  2 Term 05/07/23 [redacted]w[redacted]d  3000 g F CS-LTranv Spinal  LIV     Name: Girl Jamesia Orlowski     Apgar1: 8  Apgar5: 9  1 Term 07/15/21 [redacted]w[redacted]d  3590 g Judie Petit CS-LTranv EPI  LIV     Name: Betsey Holiday     Apgar1: 7  Apgar5: 9    PROBLEM LIST  Pregnancy complications or risks: Patient Active Problem List   Diagnosis Date Noted   Post-operative state 05/07/2023   Term pregnancy, repeat 05/07/2023   Headache in pregnancy, antepartum, third trimester 04/03/2023   Nausea and vomiting in pregnancy 03/30/2023   Anemia in pregnancy, third trimester 03/30/2023   Labor and delivery, indication for care 03/11/2023   Obesity in pregnancy 11/06/2022   Previous cesarean delivery affecting pregnancy 11/06/2022   Rh negative state in antepartum period 10/30/2022   Supervision of other high risk pregnancy, antepartum, unspecified trimester 10/30/2022   Supervision of other normal pregnancy, antepartum 10/05/2022   Choledocholithiasis 11/06/2021   History of infertility 12/29/2020   Obesity (BMI 30-39.9) 12/29/2020   PCO (polycystic ovaries) 12/29/2020   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   Neck pain 02/26/2018   Symptomatic mammary hypertrophy 02/26/2018   Crohn's colitis (HCC) 09/12/2017   Other headache syndrome 03/26/2017   Chest pain 03/26/2017   Esophageal reflux 03/26/2017   Asthma 09/30/2014   Anemia 09/30/2014   Keloid scar 09/19/2011   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   Peritoneal abscess (HCC) 03/13/2010   Maternal Crohn's disease affecting pregnancy in second trimester (HCC) 03/08/2004    Prenatal labs  and studies: ABO, Rh: --/--/O NEG (07/29 4782) Antibody: NEG (07/29 0642) Rubella: 10.70 (01/18 1552) RPR: NON REACTIVE (07/26 1337)  HBsAg: Negative (01/18 1552)  HIV: Non Reactive (05/15 1027)  NFA:OZHYQMVH/-- (07/09 1620)   Past Medical History:  Diagnosis Date   Anemia    COVID    Crohn disease (HCC)    PCOS (polycystic ovarian syndrome)      Past Surgical History:  Procedure Laterality Date   APPENDECTOMY     CESAREAN SECTION  07/15/2021   Procedure: CESAREAN SECTION;  Surgeon: Linzie Collin, MD;  Location: [facility] ORS;  Service: Obstetrics;;   CESAREAN SECTION N/A 05/07/2023   Procedure: REPEAT CESAREAN DELIVERY;  Surgeon: Linzie Collin, MD;  Location: [facility] ORS;  Service: Obstetrics;  Laterality: N/A;   CHOLECYSTECTOMY     COLECTOMY     COLOSTOMY     COLOSTOMY REVERSAL     ERCP N/A 11/07/2021   Procedure: ENDOSCOPIC RETROGRADE CHOLANGIOPANCREATOGRAPHY (ERCP);  Surgeon: Midge Minium, MD;  Location: [facility] ENDOSCOPY;  Service: Endoscopy;  Laterality: N/A;     Medications    Discharge Medication List as of 05/10/2023 12:21 PM     START taking these medications   Details  ibuprofen (ADVIL) 600 MG tablet Take 1 tablet (600 mg total) by mouth every 6 (six) hours., Starting Thu 05/10/2023, Normal    oxyCODONE (OXY IR/ROXICODONE) 5 MG immediate release tablet Take 1 tablet (5  mg total) by mouth every 6 (six) hours as needed for up to 5 days for severe pain., Starting Thu 05/10/2023, Until Tue 05/15/2023 at 2359, Normal    senna-docusate (SENOKOT-S) 8.6-50 MG tablet Take 2 tablets by mouth at bedtime as needed for mild constipation., Starting Thu 05/10/2023, Normal    simethicone (MYLICON) 80 MG chewable tablet Chew 1 tablet (80 mg total) by mouth 4 (four) times daily., Starting Thu 05/10/2023, Normal       CONTINUE these medications which have CHANGED   Details  acetaminophen (TYLENOL) 500 MG tablet Take 2 tablets (1,000 mg total) by mouth every 6 (six) hours as needed for  moderate pain., Starting Thu 05/10/2023, Normal       CONTINUE these medications which have NOT CHANGED   Details  ferrous sulfate 325 (65 FE) MG EC tablet Take 1 tablet (325 mg total) by mouth 2 (two) times daily., Starting Sun 02/25/2023, Until Mon 02/25/2024, Normal    Prenat-FeAsp-Meth-FA-DHA w/o A (PRENATE PIXIE) 10-0.6-0.4-200 MG CAPS Take 1 capsule by mouth daily., Starting Thu 10/05/2022, Normal    inFLIXimab (REMICADE IV) Inject 10 mg PE/kg into the vein every 28 (twenty-eight) days., Historical Med       STOP taking these medications     butalbital-acetaminophen-caffeine (FIORICET) 50-325-40 MG tablet Comments:  Reason for Stopping:           Allergies  Other  Review of Systems  Pertinent items noted in HPI and remainder of comprehensive ROS otherwise negative.  Physical Exam  BP 133/82 (BP Location: Left Arm)   Pulse (!) 103   Temp 98.8 F (37.1 C) (Oral)   Resp 18   Ht 4\' 11"  (1.499 m)   Wt 83 kg   LMP 08/07/2022 (Exact Date)   SpO2 98%   Breastfeeding Unknown   BMI 36.94 kg/m   Lungs:  CTA B Cardio: RRR without M/R/G Abd: Soft, gravid, NT Presentation: cephalic EXT: No C/C/ 1+ Edema DTRs: 2+ B CERVIX:    See Prenatal records for more detailed PE.     FHR:  Variability: Good {> 6 bpm)  Toco: Uterine Contractions: None  Test Results  No results found for this or any previous visit (from the past 24 hour(s)).   Assessment   G2P2002 at [redacted]w[redacted]d Estimated Date of Delivery: 05/14/23  The fetus is reassuring.   Patient Active Problem List   Diagnosis Date Noted   Post-operative state 05/07/2023   Term pregnancy, repeat 05/07/2023   Headache in pregnancy, antepartum, third trimester 04/03/2023   Nausea and vomiting in pregnancy 03/30/2023   Anemia in pregnancy, third trimester 03/30/2023   Labor and delivery, indication for care 03/11/2023   Obesity in pregnancy 11/06/2022   Previous cesarean delivery affecting pregnancy 11/06/2022   Rh  negative state in antepartum period 10/30/2022   Supervision of other high risk pregnancy, antepartum, unspecified trimester 10/30/2022   Supervision of other normal pregnancy, antepartum 10/05/2022   Choledocholithiasis 11/06/2021   History of infertility 12/29/2020   Obesity (BMI 30-39.9) 12/29/2020   PCO (polycystic ovaries) 12/29/2020   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   Neck pain 02/26/2018   Symptomatic mammary hypertrophy 02/26/2018   Crohn's colitis (HCC) 09/12/2017   Other headache syndrome 03/26/2017   Chest pain 03/26/2017   Esophageal reflux 03/26/2017   Asthma 09/30/2014   Anemia 09/30/2014   Keloid scar 09/19/2011   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   Peritoneal abscess (HCC) 03/13/2010   Maternal Crohn's disease affecting pregnancy in  second trimester (HCC) 03/08/2004    Plan  1. Admit to L&D :   2. EFM: -- Category 1 3.  Plan repeat cesarean delivery.  Elonda Husky, M.D. 05/24/2023 9:09 AM

## 2023-06-20 ENCOUNTER — Ambulatory Visit (INDEPENDENT_AMBULATORY_CARE_PROVIDER_SITE_OTHER): Payer: Medicaid Other | Admitting: Obstetrics

## 2023-06-20 ENCOUNTER — Encounter: Payer: Self-pay | Admitting: Obstetrics

## 2023-06-20 ENCOUNTER — Other Ambulatory Visit: Payer: Self-pay | Admitting: Obstetrics

## 2023-06-20 DIAGNOSIS — D62 Acute posthemorrhagic anemia: Secondary | ICD-10-CM

## 2023-06-20 MED ORDER — FUSION PLUS PO CAPS: 1.0000 | ORAL_CAPSULE | Freq: Every day | ORAL | 12 refills | Status: DC

## 2023-06-20 MED ORDER — NORETHINDRONE 0.35 MG PO TABS: 1.0000 | ORAL_TABLET | Freq: Every day | ORAL | 3 refills | Status: DC

## 2023-06-20 NOTE — Progress Notes (Signed)
Post Partum Visit Note  Sandra Wilkerson is a 27 y.o. G33P2002 female who presents for a postpartum visit. She is 6 weeks postpartum following a repeat cesarean section.  I have fully reviewed the prenatal and intrapartum course. The delivery was at 39 gestational weeks.  Anesthesia: spinal. Postpartum course has been uncomplicated. Baby is doing well. Baby is feeding by  expressed breast milk . Bleeding  comes and goes . Bowel function is normal. Bladder function is normal. Patient is not sexually active. Contraception method: considering options, desires POPs today. Postpartum depression screening: negative. She is feeling overwhelmed by the demands of caring for 2 very small children with minimal help.   The pregnancy intention screening data noted above was reviewed. Potential methods of contraception were discussed. The patient elected to proceed with POPs.   Edinburgh Postnatal Depression Scale - 06/20/23 1539       Edinburgh Postnatal Depression Scale:  In the Past 7 Days   I have been able to laugh and see the funny side of things. 0    I have looked forward with enjoyment to things. 0    I have blamed myself unnecessarily when things went wrong. 0    I have been anxious or worried for no good reason. 0    I have felt scared or panicky for no good reason. 0    Things have been getting on top of me. 0    I have been so unhappy that I have had difficulty sleeping. 0    I have felt sad or miserable. 0    I have been so unhappy that I have been crying. 0    The thought of harming myself has occurred to me. 0    Edinburgh Postnatal Depression Scale Total 0             Health Maintenance Due  Topic Date Due   HPV VACCINES (2 - 2-dose series) 12/12/2006   INFLUENZA VACCINE  05/10/2023   COVID-19 Vaccine (1 - 2023-24 season) Never done    The following portions of the patient's history were reviewed and updated as appropriate: allergies, current medications, past medical  history, past surgical history, and problem list.  Review of Systems Pertinent items are noted in HPI.  Objective:  BP 108/74   Pulse 71   Wt 160 lb (72.6 kg)   Breastfeeding Yes   BMI 32.32 kg/m    General:  alert, cooperative, and appears stated age   Breasts:  not indicated  Lungs: clear to auscultation bilaterally  Heart:  regular rate and rhythm, S1, S2 normal, no murmur, click, rub or gallop  Abdomen: soft, non-tender; bowel sounds normal; no masses,  no organomegaly   Wound: well approximated incision  GU exam:  not indicated       Assessment:    There are no diagnoses linked to this encounter.  Normal postpartum exam.   Plan:   Essential components of care per ACOG recommendations:  1.  Mood and well being: Patient with negative depression screening today. Reviewed local resources for support.  - Patient tobacco use? No.   - hx of drug use? No.    2. Infant care and feeding:  -Patient currently breastmilk feeding? Yes  -Social determinants of health (SDOH) reviewed in EPIC. No concerns.   3. Sexuality, contraception and birth spacing - Patient does not want a pregnancy in the next year.   - Reviewed reproductive life planning. Reviewed contraceptive methods based  on pt preferences and effectiveness.  Patient desired Oral Contraceptive today.  Rx sent. - Discussed birth spacing of 18 months  4. Sleep and fatigue -Encouraged family/partner/community support of 4 hrs of uninterrupted sleep to help with mood and fatigue  5. Physical Recovery  - Patient had a C-section. Wound healing reviewed. Patient expressed understanding - Patient has urinary incontinence? No. - Patient is safe to resume physical and sexual activity  6.  Health Maintenance - HM due items addressed Yes - Last pap smear  Diagnosis  Date Value Ref Range Status  11/06/2022   Final   - Negative for intraepithelial lesion or malignancy (NILM)   Pap smear not done at today's visit.  -Breast  Cancer screening indicated? No.   7. Chronic Disease/Pregnancy Condition follow up: Anemia -CBC today -Samples of Fusion Plus given  - PCP follow up  Glenetta Borg, CNM [facility] [place], [facility]

## 2023-06-21 LAB — CBC
Hematocrit: 39.9 % (ref 34.0–46.6)
Hemoglobin: 12.1 g/dL (ref 11.1–15.9)
MCH: 25.8 pg — ABNORMAL LOW (ref 26.6–33.0)
MCHC: 30.3 g/dL — ABNORMAL LOW (ref 31.5–35.7)
MCV: 85 fL (ref 79–97)
Platelets: 261 10*3/uL (ref 150–450)
RBC: 4.69 x10E6/uL (ref 3.77–5.28)
RDW: 20.2 % — ABNORMAL HIGH (ref 11.7–15.4)
WBC: 7.6 10*3/uL (ref 3.4–10.8)

## 2023-07-03 ENCOUNTER — Encounter: Payer: Self-pay | Admitting: Obstetrics and Gynecology

## 2023-08-07 ENCOUNTER — Ambulatory Visit: Payer: Medicaid Other | Admitting: Licensed Practical Nurse

## 2023-08-07 VITALS — BP 124/86 | HR 81 | Wt 163.7 lb

## 2023-08-07 DIAGNOSIS — Z30017 Encounter for initial prescription of implantable subdermal contraceptive: Secondary | ICD-10-CM

## 2023-08-07 DIAGNOSIS — Z3202 Encounter for pregnancy test, result negative: Secondary | ICD-10-CM

## 2023-08-07 DIAGNOSIS — Z30014 Encounter for initial prescription of intrauterine contraceptive device: Secondary | ICD-10-CM | POA: Diagnosis not present

## 2023-08-07 DIAGNOSIS — Z30430 Encounter for insertion of intrauterine contraceptive device: Secondary | ICD-10-CM

## 2023-08-07 LAB — POCT URINE PREGNANCY: Preg Test, Ur: NEGATIVE

## 2023-08-07 MED ORDER — LEVONORGESTREL 20 MCG/DAY IU IUD
1.0000 | INTRAUTERINE_SYSTEM | Freq: Once | INTRAUTERINE | Status: AC
Start: 2023-08-07 — End: 2023-08-07
  Administered 2023-08-07: 1 via INTRAUTERINE

## 2023-08-07 NOTE — Progress Notes (Signed)
   GYNECOLOGY OFFICE PROCEDURE NOTE  Sandra Wilkerson is here to switch her birth control method. She currently is using POP's but tends to forget to take them on time. Discussed risks and benefits of Nexplanon and Mirena IUD.   Sandra Wilkerson is a 27 y.o. [phone] here for a Mirena IUD insertion. No GYN concerns.  Last pap smear was on 10/2022 and was normal.  The patient is currently using POP for contraception and her LMP, she had her first cycle 2 weeks ago.  The indication for her IUD is contraception/cycle control.  OBJECTIVE: BP 124/86   Pulse 81   Wt 163 lb 11.2 oz (74.3 kg)   BMI 33.06 kg/m   Gen: NAD  UPT negative   IUD Insertion Procedure Note Patient identified, informed consent performed, consent signed.   Discussed risks of irregular bleeding, cramping, infection, malpositioning, expulsion or uterine perforation of the IUD (1:1000 placements)  which may require further procedure such as laparoscopy.  IUD while effective at preventing pregnancy do not prevent transmission of sexually transmitted diseases and use of barrier methods for this purpose was discussed. Time out was performed.  Urine pregnancy test negative.  Speculum placed in the vagina.  Cervix visualized.  Cleaned with Betadine x 2.  Grasped anteriorly with a single tooth tenaculum.  Uterus sounded to 7 cm. IUD placed per manufacturer's recommendations.  Strings trimmed to 3 cm. Tenaculum was removed, good hemostasis noted.  Patient tolerated procedure well.   Patient was given post-procedure instructions.  She was advised to have backup contraception for one week.  Patient was also asked to check IUD strings periodically and follow up in 6 weeks for IUD check.   Carie Caddy, CNM  [facility]

## 2023-10-12 ENCOUNTER — Encounter: Payer: Self-pay | Admitting: Cardiology

## 2023-10-12 ENCOUNTER — Ambulatory Visit: Payer: Medicaid Other | Admitting: Cardiology

## 2023-10-12 VITALS — BP 126/90 | HR 75 | Ht 59.0 in | Wt 168.6 lb

## 2023-10-12 DIAGNOSIS — J0110 Acute frontal sinusitis, unspecified: Secondary | ICD-10-CM | POA: Diagnosis not present

## 2023-10-12 DIAGNOSIS — Z0130 Encounter for examination of blood pressure without abnormal findings: Secondary | ICD-10-CM

## 2023-10-12 MED ORDER — AMOXICILLIN-POT CLAVULANATE 875-125 MG PO TABS: 1.0000 | ORAL_TABLET | Freq: Two times a day (BID) | ORAL | 0 refills | Status: AC

## 2023-10-12 MED ORDER — FLUTICASONE PROPIONATE 50 MCG/ACT NA SUSP: 1.0000 | Freq: Every day | NASAL | 3 refills | Status: DC

## 2023-10-12 NOTE — Progress Notes (Signed)
 Established Patient Office Visit  Subjective:  Patient ID: Sandra Wilkerson, female    DOB: 1996/07/25  Age: 28 y.o. MRN: [id number]  Chief Complaint  Patient presents with   Cough    Patient in office for an acute visit. Patient complaining of a cough. Started 2 weeks ago. Patient reports family members tested positive for the flu. Patient states she had the same symptoms but did not get tested. Patient complaining of a cough, frontal sinus pain, PND, runny nose and nasal congestion. Will send in Augmentin , Flonase . Recommend Mucinex, increase water intake.   Cough This is a new problem. The current episode started 1 to 4 weeks ago. The problem has been unchanged. The problem occurs every few minutes. The cough is Non-productive. Associated symptoms include nasal congestion, postnasal drip and rhinorrhea. Pertinent negatives include no chest pain, ear pain, fever, headaches, myalgias, sore throat or shortness of breath. Treatments tried: Mucinex, Nyquil. The treatment provided mild relief. Her past medical history is significant for asthma.    No other concerns at this time.   Past Medical History:  Diagnosis Date   Anemia    COVID    Crohn disease (HCC)    PCOS (polycystic ovarian syndrome)     Past Surgical History:  Procedure Laterality Date   APPENDECTOMY     CESAREAN SECTION  07/15/2021   Procedure: CESAREAN SECTION;  Surgeon: Janit Alm Agent, MD;  Location: [facility] ORS;  Service: Obstetrics;;   CESAREAN SECTION N/A 05/07/2023   Procedure: REPEAT CESAREAN DELIVERY;  Surgeon: Janit Alm Agent, MD;  Location: [facility] ORS;  Service: Obstetrics;  Laterality: N/A;   CHOLECYSTECTOMY     COLECTOMY     COLOSTOMY     COLOSTOMY REVERSAL     ERCP N/A 11/07/2021   Procedure: ENDOSCOPIC RETROGRADE CHOLANGIOPANCREATOGRAPHY (ERCP);  Surgeon: Jinny Carmine, MD;  Location: [facility] ENDOSCOPY;  Service: Endoscopy;  Laterality: N/A;    Social History   Socioeconomic History   Marital  status: Significant Other    Spouse name: Darold   Number of children: 1   Years of education: 16   Highest education level: Not on file  Occupational History   Occupation: childcare  Tobacco Use   Smoking status: Never   Smokeless tobacco: Never  Vaping Use   Vaping status: Never Used  Substance and Sexual Activity   Alcohol use: No   Drug use: No   Sexual activity: Yes    Partners: Male    Birth control/protection: None  Other Topics Concern   Not on file  Social History Narrative   Not on file   Social Drivers of Health   Financial Resource Strain: Low Risk  (10/05/2022)   Overall Financial Resource Strain (CARDIA)    Difficulty of Paying Living Expenses: Not very hard  Food Insecurity: No Food Insecurity (05/07/2023)   Hunger Vital Sign    Worried About Running Out of Food in the Last Year: Never true    Ran Out of Food in the Last Year: Never true  Transportation Needs: No Transportation Needs (05/07/2023)   PRAPARE - Administrator, Civil Service (Medical): No    Lack of Transportation (Non-Medical): No  Physical Activity: Insufficiently Active (10/05/2022)   Exercise Vital Sign    Days of Exercise per Week: 1 day    Minutes of Exercise per Session: 10 min  Stress: No Stress Concern Present (10/05/2022)   [employer] of Occupational Health - Occupational Stress Questionnaire  Feeling of Stress : Not at all  Social Connections: Moderately Integrated (10/05/2022)   Social Connection and Isolation Panel [NHANES]    Frequency of Communication with Friends and Family: More than three times a week    Frequency of Social Gatherings with Friends and Family: Three times a week    Attends Religious Services: More than 4 times per year    Active Member of Clubs or Organizations: No    Attends Banker Meetings: Never    Marital Status: Living with partner  Intimate Partner Violence: Not At Risk (05/07/2023)   Humiliation, Afraid, Rape, and  Kick questionnaire    Fear of Current or Ex-Partner: No    Emotionally Abused: No    Physically Abused: No    Sexually Abused: No    Family History  Problem Relation Age of Onset   Healthy Mother    Asthma Father    Healthy Sister    Healthy Sister    Healthy Sister    Healthy Sister    Healthy Sister    Diabetes Maternal Grandmother    Alzheimer's disease Maternal Grandmother    Healthy Maternal Grandfather    Congestive Heart Failure Paternal Grandmother     Allergies  Allergen Reactions   Other Anaphylaxis    Sodium Ferric Gluconate    Outpatient Medications Prior to Visit  Medication Sig   ferrous sulfate  325 (65 FE) MG EC tablet Take 1 tablet (325 mg total) by mouth 2 (two) times daily.   inFLIXimab  (REMICADE ) 100 MG injection Inject into the vein.   Iron -FA-B Cmp-C-Biot-Probiotic (FUSION PLUS) CAPS TAKE 1 CAPSULE BY MOUTH EVERY DAY   [DISCONTINUED] acetaminophen  (TYLENOL ) 500 MG tablet Take 2 tablets (1,000 mg total) by mouth every 6 (six) hours as needed for moderate pain. (Patient not taking: Reported on 10/12/2023)   [DISCONTINUED] ibuprofen  (ADVIL ) 600 MG tablet Take 1 tablet (600 mg total) by mouth every 6 (six) hours. (Patient not taking: Reported on 10/12/2023)   [DISCONTINUED] inFLIXimab  (REMICADE  IV) Inject 10 mg PE/kg into the vein every 28 (twenty-eight) days. (Patient not taking: Reported on 06/20/2023)   [DISCONTINUED] norethindrone  (MICRONOR ) 0.35 MG tablet Take 1 tablet (0.35 mg total) by mouth daily. (Patient not taking: Reported on 10/12/2023)   [DISCONTINUED] Prenat-FeAsp-Meth-FA-DHA w/o A (PRENATE PIXIE ) 10-0.6-0.4-200 MG CAPS Take 1 capsule by mouth daily. (Patient not taking: Reported on 10/12/2023)   [DISCONTINUED] senna-docusate (SENOKOT-S) 8.6-50 MG tablet Take 2 tablets by mouth at bedtime as needed for mild constipation. (Patient not taking: Reported on 10/12/2023)   [DISCONTINUED] simethicone  (MYLICON) 80 MG chewable tablet Chew 1 tablet (80 mg total) by  mouth 4 (four) times daily. (Patient not taking: Reported on 10/12/2023)   No facility-administered medications prior to visit.    Review of Systems  Constitutional: Negative.  Negative for fever.  HENT:  Positive for congestion, postnasal drip, rhinorrhea and sinus pain. Negative for ear pain and sore throat.   Eyes: Negative.   Respiratory:  Positive for cough. Negative for sputum production and shortness of breath.   Cardiovascular: Negative.  Negative for chest pain.  Gastrointestinal: Negative.  Negative for abdominal pain, constipation and diarrhea.  Genitourinary: Negative.   Musculoskeletal:  Negative for joint pain and myalgias.  Skin: Negative.   Neurological: Negative.  Negative for dizziness and headaches.  Endo/Heme/Allergies: Negative.   All other systems reviewed and are negative.      Objective:   BP (!) 126/90   Pulse 75   Ht 4' 11 (  1.499 m)   Wt 168 lb 9.6 oz (76.5 kg)   SpO2 98%   BMI 34.05 kg/m   Vitals:   10/12/23 1115  BP: (!) 126/90  Pulse: 75  Height: 4' 11 (1.499 m)  Weight: 168 lb 9.6 oz (76.5 kg)  SpO2: 98%  BMI (Calculated): 34.03    Physical Exam Vitals and nursing note reviewed.  Constitutional:      Appearance: Normal appearance. She is normal weight.  HENT:     Head: Normocephalic and atraumatic.     Nose: Nose normal.     Mouth/Throat:     Mouth: Mucous membranes are moist.  Eyes:     Extraocular Movements: Extraocular movements intact.     Conjunctiva/sclera: Conjunctivae normal.     Pupils: Pupils are equal, round, and reactive to light.  Cardiovascular:     Rate and Rhythm: Normal rate and regular rhythm.     Pulses: Normal pulses.     Heart sounds: Normal heart sounds.  Pulmonary:     Effort: Pulmonary effort is normal.     Breath sounds: Normal breath sounds.  Abdominal:     General: Abdomen is flat. Bowel sounds are normal.     Palpations: Abdomen is soft.  Musculoskeletal:        General: Normal range of motion.      Cervical back: Normal range of motion.  Skin:    General: Skin is warm and dry.  Neurological:     General: No focal deficit present.     Mental Status: She is alert and oriented to person, place, and time.  Psychiatric:        Mood and Affect: Mood normal.        Behavior: Behavior normal.        Thought Content: Thought content normal.        Judgment: Judgment normal.      No results found for any visits on 10/12/23.  Recent Results (from the past 2160 hours)  POCT urine pregnancy     Status: None   Collection Time: 08/07/23  4:24 PM  Result Value Ref Range   Preg Test, Ur Negative Negative      Assessment & Plan:  Augmentin  Flonase  Mucinex Increase water intake  Problem List Items Addressed This Visit       Respiratory   Acute non-recurrent frontal sinusitis - Primary   Relevant Medications   amoxicillin -clavulanate (AUGMENTIN ) 875-125 MG tablet   fluticasone  (FLONASE ) 50 MCG/ACT nasal spray    Return if symptoms worsen or fail to improve.   Total time spent: 25 minutes  Google, NP  10/12/2023   This document may have been prepared by Dragon Voice Recognition software and as such may include unintentional dictation errors.

## 2023-11-01 ENCOUNTER — Encounter: Payer: Self-pay | Admitting: Cardiology

## 2023-11-01 ENCOUNTER — Ambulatory Visit: Payer: Medicaid Other | Admitting: Cardiology

## 2023-11-01 VITALS — BP 124/72 | HR 95 | Ht 59.0 in | Wt 171.0 lb

## 2023-11-01 DIAGNOSIS — Z87898 Personal history of other specified conditions: Secondary | ICD-10-CM | POA: Diagnosis not present

## 2023-11-01 DIAGNOSIS — G4733 Obstructive sleep apnea (adult) (pediatric): Secondary | ICD-10-CM

## 2023-11-01 DIAGNOSIS — R0683 Snoring: Secondary | ICD-10-CM | POA: Insufficient documentation

## 2023-11-01 DIAGNOSIS — Z0130 Encounter for examination of blood pressure without abnormal findings: Secondary | ICD-10-CM

## 2023-11-01 NOTE — Progress Notes (Signed)
Established Patient Office Visit  Subjective:  Patient ID: Sandra Wilkerson, female    DOB: Sep 18, 1996  Age: 28 y.o. MRN: [id number]  Chief Complaint  Patient presents with   Follow-up    Pt is needing sleep study     Patient in office wanting to discuss getting a sleep study. Patient states she has been told she snores, her significant other as reported she stops breathing when she is sleeping. Will send order for sleep study.     No other concerns at this time.   Past Medical History:  Diagnosis Date   Anemia    COVID    Crohn disease (HCC)    PCOS (polycystic ovarian syndrome)     Past Surgical History:  Procedure Laterality Date   APPENDECTOMY     CESAREAN SECTION  07/15/2021   Procedure: CESAREAN SECTION;  Surgeon: Linzie Collin, MD;  Location: [facility] ORS;  Service: Obstetrics;;   CESAREAN SECTION N/A 05/07/2023   Procedure: REPEAT CESAREAN DELIVERY;  Surgeon: Linzie Collin, MD;  Location: [facility] ORS;  Service: Obstetrics;  Laterality: N/A;   CHOLECYSTECTOMY     COLECTOMY     COLOSTOMY     COLOSTOMY REVERSAL     ERCP N/A 11/07/2021   Procedure: ENDOSCOPIC RETROGRADE CHOLANGIOPANCREATOGRAPHY (ERCP);  Surgeon: Midge Minium, MD;  Location: [facility] ENDOSCOPY;  Service: Endoscopy;  Laterality: N/A;    Social History   Socioeconomic History   Marital status: Significant Other    Spouse name: Everlean Alstrom   Number of children: 1   Years of education: 16   Highest education level: Not on file  Occupational History   Occupation: childcare  Tobacco Use   Smoking status: Never   Smokeless tobacco: Never  Vaping Use   Vaping status: Never Used  Substance and Sexual Activity   Alcohol use: No   Drug use: No   Sexual activity: Yes    Partners: Male    Birth control/protection: None  Other Topics Concern   Not on file  Social History Narrative   Not on file   Social Drivers of Health   Financial Resource Strain: Low Risk  (10/05/2022)   Overall Financial  Resource Strain (CARDIA)    Difficulty of Paying Living Expenses: Not very hard  Food Insecurity: No Food Insecurity (05/07/2023)   Hunger Vital Sign    Worried About Running Out of Food in the Last Year: Never true    Ran Out of Food in the Last Year: Never true  Transportation Needs: No Transportation Needs (05/07/2023)   PRAPARE - Administrator, Civil Service (Medical): No    Lack of Transportation (Non-Medical): No  Physical Activity: Insufficiently Active (10/05/2022)   Exercise Vital Sign    Days of Exercise per Week: 1 day    Minutes of Exercise per Session: 10 min  Stress: No Stress Concern Present (10/05/2022)   [employer] of Occupational Health - Occupational Stress Questionnaire    Feeling of Stress : Not at all  Social Connections: Moderately Integrated (10/05/2022)   Social Connection and Isolation Panel [NHANES]    Frequency of Communication with Friends and Family: More than three times a week    Frequency of Social Gatherings with Friends and Family: Three times a week    Attends Religious Services: More than 4 times per year    Active Member of Clubs or Organizations: No    Attends Banker Meetings: Never    Marital Status: Living with  partner  Intimate Partner Violence: Not At Risk (05/07/2023)   Humiliation, Afraid, Rape, and Kick questionnaire    Fear of Current or Ex-Partner: No    Emotionally Abused: No    Physically Abused: No    Sexually Abused: No    Family History  Problem Relation Age of Onset   Healthy Mother    Asthma Father    Healthy Sister    Healthy Sister    Healthy Sister    Healthy Sister    Healthy Sister    Diabetes Maternal Grandmother    Alzheimer's disease Maternal Grandmother    Healthy Maternal Grandfather    Congestive Heart Failure Paternal Grandmother     Allergies  Allergen Reactions   Other Anaphylaxis    Sodium Ferric Gluconate    Outpatient Medications Prior to Visit  Medication Sig    fluticasone (FLONASE) 50 MCG/ACT nasal spray Place 1 spray into both nostrils daily.   inFLIXimab (REMICADE) 100 MG injection Inject into the vein.   [DISCONTINUED] ferrous sulfate 325 (65 FE) MG EC tablet Take 1 tablet (325 mg total) by mouth 2 (two) times daily. (Patient not taking: Reported on 11/01/2023)   [DISCONTINUED] Iron-FA-B Cmp-C-Biot-Probiotic (FUSION PLUS) CAPS TAKE 1 CAPSULE BY MOUTH EVERY DAY (Patient not taking: Reported on 11/01/2023)   No facility-administered medications prior to visit.    Review of Systems  Constitutional: Negative.   HENT: Negative.    Eyes: Negative.   Respiratory: Negative.  Negative for shortness of breath.   Cardiovascular: Negative.  Negative for chest pain.  Gastrointestinal: Negative.  Negative for abdominal pain, constipation and diarrhea.  Genitourinary: Negative.   Musculoskeletal:  Negative for joint pain and myalgias.  Skin: Negative.   Neurological: Negative.  Negative for dizziness and headaches.  Endo/Heme/Allergies: Negative.   All other systems reviewed and are negative.      Objective:   BP 124/72   Pulse 95   Ht 4\' 11"  (1.499 m)   Wt 171 lb (77.6 kg)   LMP  (LMP Unknown)   SpO2 98%   BMI 34.54 kg/m   Vitals:   11/01/23 1054  BP: 124/72  Pulse: 95  Height: 4\' 11"  (1.499 m)  Weight: 171 lb (77.6 kg)  SpO2: 98%  BMI (Calculated): 34.52    Physical Exam Vitals and nursing note reviewed.  Constitutional:      Appearance: Normal appearance. She is normal weight.  HENT:     Head: Normocephalic and atraumatic.     Nose: Nose normal.     Mouth/Throat:     Mouth: Mucous membranes are moist.  Eyes:     Extraocular Movements: Extraocular movements intact.     Conjunctiva/sclera: Conjunctivae normal.     Pupils: Pupils are equal, round, and reactive to light.  Cardiovascular:     Rate and Rhythm: Normal rate and regular rhythm.     Pulses: Normal pulses.     Heart sounds: Normal heart sounds.  Pulmonary:      Effort: Pulmonary effort is normal.     Breath sounds: Normal breath sounds.  Abdominal:     General: Abdomen is flat. Bowel sounds are normal.     Palpations: Abdomen is soft.  Musculoskeletal:        General: Normal range of motion.     Cervical back: Normal range of motion.  Skin:    General: Skin is warm and dry.  Neurological:     General: No focal deficit present.  Mental Status: She is alert and oriented to person, place, and time.  Psychiatric:        Mood and Affect: Mood normal.        Behavior: Behavior normal.        Thought Content: Thought content normal.        Judgment: Judgment normal.      No results found for any visits on 11/01/23.  Recent Results (from the past 2160 hours)  POCT urine pregnancy     Status: None   Collection Time: 08/07/23  4:24 PM  Result Value Ref Range   Preg Test, Ur Negative Negative      Assessment & Plan:  Referral sent for sleep study.  Problem List Items Addressed This Visit       Other   Snoring - Primary   Relevant Orders   Ambulatory referral to Sleep Studies   Other Visit Diagnoses       History of apnea       Relevant Orders   Ambulatory referral to Sleep Studies       Return if symptoms worsen or fail to improve.   Total time spent: 25 minutes  Google, NP  11/01/2023   This document may have been prepared by Dragon Voice Recognition software and as such may include unintentional dictation errors.

## 2023-11-07 ENCOUNTER — Ambulatory Visit: Payer: Medicaid Other | Admitting: Cardiology

## 2023-11-08 ENCOUNTER — Encounter: Payer: Self-pay | Admitting: Cardiology

## 2023-11-08 ENCOUNTER — Ambulatory Visit: Payer: Medicaid Other | Admitting: Cardiology

## 2023-11-08 VITALS — BP 112/80 | HR 98 | Ht 59.0 in | Wt 169.0 lb

## 2023-11-08 DIAGNOSIS — N898 Other specified noninflammatory disorders of vagina: Secondary | ICD-10-CM | POA: Diagnosis not present

## 2023-11-08 DIAGNOSIS — R079 Chest pain, unspecified: Secondary | ICD-10-CM | POA: Diagnosis not present

## 2023-11-08 NOTE — Progress Notes (Signed)
Established Patient Office Visit  Subjective:  Patient ID: Sandra Wilkerson, female    DOB: [birth year]  Age: 28 y.o. MRN: [id number]  Chief Complaint  Patient presents with   Acute Visit    Possibly BV, previous symptoms not resolved    Patient in office for an acute visit. Patient states she has been treating a yeast infection at home with no relief. Will do a self swab in office today for BV.  Patient also complaining of chest pain on inspiration. Had the lfu in December. Since then, she gets chest pain on right side of chest with inspiration. EKG today shows NSR, HR 88 bpm, non-specific ST changes. Will refer to cardiology.   Chest Pain  This is a new problem. The current episode started 1 to 4 weeks ago. The onset quality is sudden. The problem occurs intermittently. The problem has been unchanged. The pain is present in the substernal region. The quality of the pain is described as dull. The pain does not radiate. Associated symptoms include shortness of breath. Pertinent negatives include no abdominal pain, back pain, dizziness, headaches or palpitations. The pain is aggravated by deep breathing and exertion. She has tried nothing for the symptoms. Risk factors include obesity and lack of exercise.    No other concerns at this time.   Past Medical History:  Diagnosis Date   Anemia    COVID    Crohn disease (HCC)    PCOS (polycystic ovarian syndrome)     Past Surgical History:  Procedure Laterality Date   APPENDECTOMY     CESAREAN SECTION  07/15/2021   Procedure: CESAREAN SECTION;  Surgeon: Linzie Collin, MD;  Location: [facility] ORS;  Service: Obstetrics;;   CESAREAN SECTION N/A 05/07/2023   Procedure: REPEAT CESAREAN DELIVERY;  Surgeon: Linzie Collin, MD;  Location: [facility] ORS;  Service: Obstetrics;  Laterality: N/A;   CHOLECYSTECTOMY     COLECTOMY     COLOSTOMY     COLOSTOMY REVERSAL     ERCP N/A 11/07/2021   Procedure: ENDOSCOPIC RETROGRADE  CHOLANGIOPANCREATOGRAPHY (ERCP);  Surgeon: Midge Minium, MD;  Location: [facility] ENDOSCOPY;  Service: Endoscopy;  Laterality: N/A;    Social History   Socioeconomic History   Marital status: Significant Other    Spouse name: Everlean Alstrom   Number of children: 1   Years of education: 16   Highest education level: Not on file  Occupational History   Occupation: childcare  Tobacco Use   Smoking status: Never   Smokeless tobacco: Never  Vaping Use   Vaping status: Never Used  Substance and Sexual Activity   Alcohol use: No   Drug use: No   Sexual activity: Yes    Partners: Male    Birth control/protection: None  Other Topics Concern   Not on file  Social History Narrative   Not on file   Social Drivers of Health   Financial Resource Strain: Low Risk  (10/05/2022)   Overall Financial Resource Strain (CARDIA)    Difficulty of Paying Living Expenses: Not very hard  Food Insecurity: No Food Insecurity (05/07/2023)   Hunger Vital Sign    Worried About Running Out of Food in the Last Year: Never true    Ran Out of Food in the Last Year: Never true  Transportation Needs: No Transportation Needs (05/07/2023)   PRAPARE - Administrator, Civil Service (Medical): No    Lack of Transportation (Non-Medical): No  Physical Activity: Insufficiently Active (10/05/2022)   Exercise  Vital Sign    Days of Exercise per Week: 1 day    Minutes of Exercise per Session: 10 min  Stress: No Stress Concern Present (10/05/2022)   [employer] of Occupational Health - Occupational Stress Questionnaire    Feeling of Stress : Not at all  Social Connections: Moderately Integrated (10/05/2022)   Social Connection and Isolation Panel [NHANES]    Frequency of Communication with Friends and Family: More than three times a week    Frequency of Social Gatherings with Friends and Family: Three times a week    Attends Religious Services: More than 4 times per year    Active Member of Clubs or  Organizations: No    Attends Banker Meetings: Never    Marital Status: Living with partner  Intimate Partner Violence: Not At Risk (05/07/2023)   Humiliation, Afraid, Rape, and Kick questionnaire    Fear of Current or Ex-Partner: No    Emotionally Abused: No    Physically Abused: No    Sexually Abused: No    Family History  Problem Relation Age of Onset   Healthy Mother    Asthma Father    Healthy Sister    Healthy Sister    Healthy Sister    Healthy Sister    Healthy Sister    Diabetes Maternal Grandmother    Alzheimer's disease Maternal Grandmother    Healthy Maternal Grandfather    Congestive Heart Failure Paternal Grandmother     Allergies  Allergen Reactions   Other Anaphylaxis    Sodium Ferric Gluconate    Outpatient Medications Prior to Visit  Medication Sig   fluticasone (FLONASE) 50 MCG/ACT nasal spray Place 1 spray into both nostrils daily.   inFLIXimab (REMICADE) 100 MG injection Inject into the vein.   No facility-administered medications prior to visit.    Review of Systems  Constitutional: Negative.   HENT: Negative.    Eyes: Negative.   Respiratory:  Positive for shortness of breath.   Cardiovascular:  Positive for chest pain. Negative for palpitations.  Gastrointestinal: Negative.  Negative for abdominal pain, constipation and diarrhea.  Genitourinary: Negative.   Musculoskeletal:  Negative for back pain, joint pain and myalgias.  Skin: Negative.   Neurological: Negative.  Negative for dizziness and headaches.  Endo/Heme/Allergies: Negative.   All other systems reviewed and are negative.      Objective:   BP 112/80   Pulse 98   Ht 4\' 11"  (1.499 m)   Wt 169 lb (76.7 kg)   LMP  (LMP Unknown)   SpO2 98%   BMI 34.13 kg/m   Vitals:   11/08/23 1118  BP: 112/80  Pulse: 98  Height: 4\' 11"  (1.499 m)  Weight: 169 lb (76.7 kg)  SpO2: 98%  BMI (Calculated): 34.12    Physical Exam Vitals and nursing note reviewed.   Constitutional:      Appearance: Normal appearance. She is normal weight.  HENT:     Head: Normocephalic and atraumatic.     Nose: Nose normal.     Mouth/Throat:     Mouth: Mucous membranes are moist.  Eyes:     Extraocular Movements: Extraocular movements intact.     Conjunctiva/sclera: Conjunctivae normal.     Pupils: Pupils are equal, round, and reactive to light.  Cardiovascular:     Rate and Rhythm: Normal rate and regular rhythm.     Pulses: Normal pulses.     Heart sounds: Normal heart sounds.  Pulmonary:  Effort: Pulmonary effort is normal.     Breath sounds: Normal breath sounds.  Abdominal:     General: Abdomen is flat. Bowel sounds are normal.     Palpations: Abdomen is soft.  Musculoskeletal:        General: Normal range of motion.     Cervical back: Normal range of motion.  Skin:    General: Skin is warm and dry.  Neurological:     General: No focal deficit present.     Mental Status: She is alert and oriented to person, place, and time.  Psychiatric:        Mood and Affect: Mood normal.        Behavior: Behavior normal.        Thought Content: Thought content normal.        Judgment: Judgment normal.      No results found for any visits on 11/08/23.  No results found for this or any previous visit (from the past 2160 hours).    Assessment & Plan:  Self swab for BV today. Will call with results.  Referral to cardiology placed.   Problem List Items Addressed This Visit       Other   Chest pain   Relevant Orders   EKG 12-Lead   Ambulatory referral to Cardiology   Other Visit Diagnoses       Vaginal itching    -  Primary   Relevant Orders   NuSwab BV and Candida, NAA       Return if symptoms worsen or fail to improve.   Total time spent: 25 minutes  [employer], NP  11/08/2023   This document may have been prepared by Dragon Voice Recognition software and as such may include unintentional dictation errors.

## 2023-11-12 ENCOUNTER — Encounter: Payer: Self-pay | Admitting: Cardiovascular Disease

## 2023-11-12 ENCOUNTER — Other Ambulatory Visit: Payer: Self-pay | Admitting: Cardiology

## 2023-11-12 LAB — NUSWAB BV AND CANDIDA, NAA
Candida albicans, NAA: NEGATIVE
Candida glabrata, NAA: NEGATIVE

## 2023-11-12 MED ORDER — METRONIDAZOLE 500 MG PO TABS: 500.0000 mg | ORAL_TABLET | Freq: Two times a day (BID) | ORAL | 0 refills | Status: AC

## 2023-11-12 NOTE — Progress Notes (Signed)
 Informed via Mychart message

## 2023-11-13 ENCOUNTER — Institutional Professional Consult (permissible substitution): Payer: Medicaid Other | Admitting: Cardiology

## 2023-11-16 ENCOUNTER — Encounter: Payer: Self-pay | Admitting: Cardiovascular Disease

## 2023-11-16 ENCOUNTER — Ambulatory Visit: Payer: Medicaid Other | Admitting: Cardiovascular Disease

## 2023-11-16 VITALS — BP 138/88 | HR 78 | Ht 59.0 in | Wt 168.0 lb

## 2023-11-16 DIAGNOSIS — R0602 Shortness of breath: Secondary | ICD-10-CM | POA: Diagnosis not present

## 2023-11-16 DIAGNOSIS — E669 Obesity, unspecified: Secondary | ICD-10-CM | POA: Diagnosis not present

## 2023-11-16 DIAGNOSIS — I259 Chronic ischemic heart disease, unspecified: Secondary | ICD-10-CM

## 2023-11-16 DIAGNOSIS — I10 Essential (primary) hypertension: Secondary | ICD-10-CM

## 2023-11-16 DIAGNOSIS — R9431 Abnormal electrocardiogram [ECG] [EKG]: Secondary | ICD-10-CM

## 2023-11-16 MED ORDER — AMLODIPINE BESYLATE 10 MG PO TABS
10.0000 mg | ORAL_TABLET | Freq: Every day | ORAL | 11 refills | Status: DC
Start: 2023-11-16 — End: 2024-08-04

## 2023-11-16 NOTE — Progress Notes (Signed)
 Cardiology Office Note   Date:  11/16/2023   ID:  Sandra Wilkerson, DOB January 18, 1996, MRN [id number]  PCP:  Carin Gauze, NP  Cardiologist:  Denyse Bathe, MD      History of Present Illness: Sandra Wilkerson is a 28 y.o. female who presents for  Chief Complaint  Patient presents with   Consult    Cardiac Consult    5 YOF presented with chest pain and came for evaluation.  Chest Pain  This is a new problem. The current episode started more than 1 month ago. The pain is present in the lateral region. The pain is at a severity of 7/10. The quality of the pain is described as dull. Associated symptoms include shortness of breath. Pertinent negatives include no diaphoresis, dizziness, nausea, palpitations or vomiting. The treatment provided no relief.      Past Medical History:  Diagnosis Date   Anemia    COVID    Crohn disease (HCC)    PCOS (polycystic ovarian syndrome)      Past Surgical History:  Procedure Laterality Date   APPENDECTOMY     CESAREAN SECTION  07/15/2021   Procedure: CESAREAN SECTION;  Surgeon: Janit Alm Agent, MD;  Location: [facility] ORS;  Service: Obstetrics;;   CESAREAN SECTION N/A 05/07/2023   Procedure: REPEAT CESAREAN DELIVERY;  Surgeon: Janit Alm Agent, MD;  Location: [facility] ORS;  Service: Obstetrics;  Laterality: N/A;   CHOLECYSTECTOMY     COLECTOMY     COLOSTOMY     COLOSTOMY REVERSAL     ERCP N/A 11/07/2021   Procedure: ENDOSCOPIC RETROGRADE CHOLANGIOPANCREATOGRAPHY (ERCP);  Surgeon: Jinny Carmine, MD;  Location: [facility] ENDOSCOPY;  Service: Endoscopy;  Laterality: N/A;     Current Outpatient Medications  Medication Sig Dispense Refill   amLODipine  (NORVASC ) 10 MG tablet Take 1 tablet (10 mg total) by mouth daily. 30 tablet 11   fluticasone  (FLONASE ) 50 MCG/ACT nasal spray Place 1 spray into both nostrils daily. 48 mL 3   inFLIXimab  (REMICADE ) 100 MG injection Inject into the vein.     metroNIDAZOLE  (FLAGYL ) 500 MG tablet Take 1 tablet  (500 mg total) by mouth 2 (two) times daily for 7 days. 14 tablet 0   No current facility-administered medications for this visit.    Allergies:   Other    Social History:   reports that she has never smoked. She has never used smokeless tobacco. She reports that she does not drink alcohol and does not use drugs.   Family History:  family history includes Alzheimer's disease in her maternal grandmother; Asthma in her father; Congestive Heart Failure in her paternal grandmother; Diabetes in her maternal grandmother; Healthy in her maternal grandfather, mother, sister, sister, sister, sister, and sister.    ROS:     Review of Systems  Constitutional:  Negative for diaphoresis.  HENT: Negative.    Eyes: Negative.   Respiratory:  Positive for shortness of breath.   Cardiovascular:  Positive for chest pain. Negative for palpitations.  Gastrointestinal: Negative.  Negative for nausea and vomiting.  Genitourinary: Negative.   Musculoskeletal: Negative.   Skin: Negative.   Neurological: Negative.  Negative for dizziness.  Endo/Heme/Allergies: Negative.   Psychiatric/Behavioral: Negative.    All other systems reviewed and are negative.     All other systems are reviewed and negative.    PHYSICAL EXAM: VS:  BP 138/88   Pulse 78   Ht 4' 11 (1.499 m)   Wt 168 lb (76.2 kg)  LMP  (LMP Unknown)   SpO2 98%   BMI 33.93 kg/m  , BMI Body mass index is 33.93 kg/m. Last weight:  Wt Readings from Last 3 Encounters:  11/16/23 168 lb (76.2 kg)  11/08/23 169 lb (76.7 kg)  11/01/23 171 lb (77.6 kg)     Physical Exam Constitutional:      Appearance: Normal appearance.  Cardiovascular:     Rate and Rhythm: Normal rate and regular rhythm.     Heart sounds: Normal heart sounds.  Pulmonary:     Effort: Pulmonary effort is normal.     Breath sounds: Normal breath sounds.  Musculoskeletal:     Right lower leg: No edema.     Left lower leg: No edema.  Neurological:     Mental Status:  She is alert.       EKG: sinus tachycardia 120/min st depression inferio and anteriorly 1.5 mm baselin suggestive ischaemia  Recent Labs: 06/20/2023: Hemoglobin 12.1; Platelets 261    Lipid Panel No results found for: CHOL, TRIG, HDL, CHOLHDL, VLDL, LDLCALC, LDLDIRECT    Other studies Reviewed: Additional studies/ records that were reviewed today include:  Review of the above records demonstrates:       No data to display            ASSESSMENT AND PLAN:    ICD-10-CM   1. Chest pain due to myocardial ischemia, unspecified ischemic chest pain type  I25.9 PCV ECHOCARDIOGRAM COMPLETE    MYOCARDIAL PERFUSION IMAGING    amLODipine  (NORVASC ) 10 MG tablet   Baseline st depression,inferolaterally ekg advise echo, stress test    2. Obesity (BMI 30-39.9)  E66.9 PCV ECHOCARDIOGRAM COMPLETE    MYOCARDIAL PERFUSION IMAGING    amLODipine  (NORVASC ) 10 MG tablet    3. SOB (shortness of breath)  R06.02 PCV ECHOCARDIOGRAM COMPLETE    MYOCARDIAL PERFUSION IMAGING    amLODipine  (NORVASC ) 10 MG tablet    4. Abnormal electrocardiogram (ECG) (EKG)  R94.31 PCV ECHOCARDIOGRAM COMPLETE    MYOCARDIAL PERFUSION IMAGING    amLODipine  (NORVASC ) 10 MG tablet   11/12/23 EKG showed sinus tachycardia 120/min st depression 2 mm inferolaterally and anteriorly, will need nuclear stress test as baseline abnormal.    5. Primary hypertension  I10 amLODipine  (NORVASC ) 10 MG tablet   Elevtaed BP advise amlodapine 10       Problem List Items Addressed This Visit       Other   Obesity (BMI 30-39.9)   Relevant Medications   amLODipine  (NORVASC ) 10 MG tablet   Other Relevant Orders   PCV ECHOCARDIOGRAM COMPLETE   MYOCARDIAL PERFUSION IMAGING   Chest pain - Primary   Relevant Medications   amLODipine  (NORVASC ) 10 MG tablet   Other Relevant Orders   PCV ECHOCARDIOGRAM COMPLETE   MYOCARDIAL PERFUSION IMAGING   Other Visit Diagnoses       SOB (shortness of breath)       Relevant  Medications   amLODipine  (NORVASC ) 10 MG tablet   Other Relevant Orders   PCV ECHOCARDIOGRAM COMPLETE   MYOCARDIAL PERFUSION IMAGING     Abnormal electrocardiogram (ECG) (EKG)       11/12/23 EKG showed sinus tachycardia 120/min st depression 2 mm inferolaterally and anteriorly, will need nuclear stress test as baseline abnormal.   Relevant Medications   amLODipine  (NORVASC ) 10 MG tablet   Other Relevant Orders   PCV ECHOCARDIOGRAM COMPLETE   MYOCARDIAL PERFUSION IMAGING     Primary hypertension       Elevtaed  BP advise amlodapine 10   Relevant Medications   amLODipine  (NORVASC ) 10 MG tablet          Disposition:   Return in about 4 weeks (around 12/14/2023) for echo, stress test and f/u.    Total time spent: 30 minutes  Signed,  Denyse Bathe, MD  11/16/2023 11:18 AM    [facility]

## 2023-12-15 ENCOUNTER — Inpatient Hospital Stay

## 2023-12-15 ENCOUNTER — Inpatient Hospital Stay
Admission: EM | Admit: 2023-12-15 | Discharge: 2023-12-16 | DRG: 204 | Disposition: A | Discharge status: Home / Self Care | Attending: Internal Medicine | Admitting: Internal Medicine

## 2023-12-15 ENCOUNTER — Emergency Department

## 2023-12-15 ENCOUNTER — Other Ambulatory Visit: Payer: Self-pay

## 2023-12-15 DIAGNOSIS — Z888 Allergy status to other drugs, medicaments and biological substances status: Secondary | ICD-10-CM | POA: Diagnosis not present

## 2023-12-15 DIAGNOSIS — Z79620 Long term (current) use of immunosuppressive biologic: Secondary | ICD-10-CM | POA: Diagnosis not present

## 2023-12-15 DIAGNOSIS — J9601 Acute respiratory failure with hypoxia: Secondary | ICD-10-CM | POA: Diagnosis present

## 2023-12-15 DIAGNOSIS — J811 Chronic pulmonary edema: Secondary | ICD-10-CM | POA: Insufficient documentation

## 2023-12-15 DIAGNOSIS — R042 Hemoptysis: Principal | ICD-10-CM

## 2023-12-15 DIAGNOSIS — Z8616 Personal history of COVID-19: Secondary | ICD-10-CM | POA: Diagnosis not present

## 2023-12-15 DIAGNOSIS — J4489 Other specified chronic obstructive pulmonary disease: Secondary | ICD-10-CM | POA: Diagnosis present

## 2023-12-15 DIAGNOSIS — I10 Essential (primary) hypertension: Secondary | ICD-10-CM | POA: Diagnosis present

## 2023-12-15 DIAGNOSIS — J4520 Mild intermittent asthma, uncomplicated: Secondary | ICD-10-CM | POA: Diagnosis present

## 2023-12-15 DIAGNOSIS — Z8249 Family history of ischemic heart disease and other diseases of the circulatory system: Secondary | ICD-10-CM

## 2023-12-15 DIAGNOSIS — Z833 Family history of diabetes mellitus: Secondary | ICD-10-CM

## 2023-12-15 DIAGNOSIS — T445X5A Adverse effect of predominantly beta-adrenoreceptor agonists, initial encounter: Secondary | ICD-10-CM | POA: Diagnosis not present

## 2023-12-15 DIAGNOSIS — Z98891 History of uterine scar from previous surgery: Secondary | ICD-10-CM | POA: Diagnosis not present

## 2023-12-15 DIAGNOSIS — Z79899 Other long term (current) drug therapy: Secondary | ICD-10-CM | POA: Diagnosis not present

## 2023-12-15 DIAGNOSIS — R7989 Other specified abnormal findings of blood chemistry: Secondary | ICD-10-CM

## 2023-12-15 DIAGNOSIS — Z820 Family history of epilepsy and other diseases of the nervous system: Secondary | ICD-10-CM

## 2023-12-15 DIAGNOSIS — Z933 Colostomy status: Secondary | ICD-10-CM

## 2023-12-15 DIAGNOSIS — Z825 Family history of asthma and other chronic lower respiratory diseases: Secondary | ICD-10-CM | POA: Diagnosis not present

## 2023-12-15 DIAGNOSIS — I2510 Atherosclerotic heart disease of native coronary artery without angina pectoris: Secondary | ICD-10-CM | POA: Diagnosis present

## 2023-12-15 DIAGNOSIS — I2489 Other forms of acute ischemic heart disease: Secondary | ICD-10-CM | POA: Diagnosis present

## 2023-12-15 DIAGNOSIS — R0902 Hypoxemia: Secondary | ICD-10-CM

## 2023-12-15 DIAGNOSIS — R519 Headache, unspecified: Secondary | ICD-10-CM | POA: Diagnosis present

## 2023-12-15 DIAGNOSIS — K5090 Crohn's disease, unspecified, without complications: Secondary | ICD-10-CM | POA: Diagnosis present

## 2023-12-15 DIAGNOSIS — J810 Acute pulmonary edema: Secondary | ICD-10-CM | POA: Diagnosis present

## 2023-12-15 DIAGNOSIS — L0390 Cellulitis, unspecified: Secondary | ICD-10-CM | POA: Diagnosis present

## 2023-12-15 DIAGNOSIS — E785 Hyperlipidemia, unspecified: Secondary | ICD-10-CM | POA: Diagnosis present

## 2023-12-15 DIAGNOSIS — T782XXA Anaphylactic shock, unspecified, initial encounter: Secondary | ICD-10-CM | POA: Diagnosis not present

## 2023-12-15 LAB — CBC
HCT: 39.6 % (ref 36.0–46.0)
Hemoglobin: 13 g/dL (ref 12.0–15.0)
MCH: 27.4 pg (ref 26.0–34.0)
MCHC: 32.8 g/dL (ref 30.0–36.0)
MCV: 83.5 fL (ref 80.0–100.0)
Platelets: 313 10*3/uL (ref 150–400)
RBC: 4.74 MIL/uL (ref 3.87–5.11)
RDW: 12.8 % (ref 11.5–15.5)
WBC: 15.7 10*3/uL — ABNORMAL HIGH (ref 4.0–10.5)
nRBC: 0 % (ref 0.0–0.2)

## 2023-12-15 LAB — HEMOGLOBIN AND HEMATOCRIT, BLOOD
HCT: 40.5 % (ref 36.0–46.0)
HCT: 40.7 % (ref 36.0–46.0)
Hemoglobin: 13.4 g/dL (ref 12.0–15.0)
Hemoglobin: 13.5 g/dL (ref 12.0–15.0)

## 2023-12-15 LAB — COMPREHENSIVE METABOLIC PANEL
ALT: 32 U/L (ref 0–44)
AST: 34 U/L (ref 15–41)
Albumin: 3.3 g/dL — ABNORMAL LOW (ref 3.5–5.0)
Alkaline Phosphatase: 60 U/L (ref 38–126)
Anion gap: 5 (ref 5–15)
BUN: 18 mg/dL (ref 6–20)
CO2: 22 mmol/L (ref 22–32)
Calcium: 8.1 mg/dL — ABNORMAL LOW (ref 8.9–10.3)
Chloride: 108 mmol/L (ref 98–111)
Creatinine, Ser: 0.68 mg/dL (ref 0.44–1.00)
GFR, Estimated: >60 mL/min (ref >60)
Glucose, Bld: 110 mg/dL — ABNORMAL HIGH (ref 70–99)
Potassium: 3.3 mmol/L — ABNORMAL LOW (ref 3.5–5.1)
Sodium: 135 mmol/L (ref 135–145)
Total Bilirubin: 0.2 mg/dL (ref 0.0–1.2)
Total Protein: 7.2 g/dL (ref 6.5–8.1)

## 2023-12-15 LAB — LACTIC ACID, PLASMA
Lactic Acid, Venous: 1.9 mmol/L (ref 0.5–1.9)
Lactic Acid, Venous: 1.9 mmol/L (ref 0.5–1.9)

## 2023-12-15 LAB — APTT: aPTT: 27 s (ref 24–36)

## 2023-12-15 LAB — TROPONIN I (HIGH SENSITIVITY)
Troponin I (High Sensitivity): 247 ng/L (ref <18)
Troponin I (High Sensitivity): 590 ng/L (ref <18)
Troponin I (High Sensitivity): 502 ng/L (ref <18)

## 2023-12-15 LAB — PROTIME-INR
INR: 1.1 (ref 0.8–1.2)
Prothrombin Time: 14 s (ref 11.4–15.2)

## 2023-12-15 LAB — BRAIN NATRIURETIC PEPTIDE: B Natriuretic Peptide: 18.2 pg/mL (ref 0.0–100.0)

## 2023-12-15 LAB — HIV ANTIBODY (ROUTINE TESTING W REFLEX): HIV Screen 4th Generation wRfx: NONREACTIVE

## 2023-12-15 LAB — LIPASE, BLOOD: Lipase: 37 U/L (ref 11–51)

## 2023-12-15 LAB — MAGNESIUM: Magnesium: 1.7 mg/dL (ref 1.7–2.4)

## 2023-12-15 LAB — HCG, QUANTITATIVE, PREGNANCY: hCG, Beta Chain, Quant, S: <1 m[IU]/mL (ref <5)

## 2023-12-15 MED ORDER — ACETAMINOPHEN 325 MG PO TABS
650.0000 mg | ORAL_TABLET | Freq: Four times a day (QID) | ORAL | Status: DC | PRN
  Administered 2023-12-15 – 2023-12-16 (×2): 650 mg via ORAL
  Filled 2023-12-15 (×2): qty 2

## 2023-12-15 MED ORDER — SODIUM CHLORIDE 0.9 % IV BOLUS
500.0000 mL | Freq: Once | INTRAVENOUS | Status: AC
  Administered 2023-12-15: 500 mL via INTRAVENOUS

## 2023-12-15 MED ORDER — FLUTICASONE PROPIONATE 50 MCG/ACT NA SUSP: 1.0000 | Freq: Every day | NASAL | Status: DC | PRN

## 2023-12-15 MED ORDER — ONDANSETRON HCL 4 MG/2ML IJ SOLN
INTRAMUSCULAR | Status: AC
  Administered 2023-12-15: 4 mg
  Filled 2023-12-15: qty 2

## 2023-12-15 MED ORDER — BUTALBITAL-APAP-CAFFEINE 50-325-40 MG PO TABS
2.0000 | ORAL_TABLET | Freq: Once | ORAL | Status: AC
  Administered 2023-12-15: 2 via ORAL
  Filled 2023-12-15: qty 2

## 2023-12-15 MED ORDER — PANTOPRAZOLE SODIUM 40 MG IV SOLR
80.0000 mg | Freq: Once | INTRAVENOUS | Status: AC
  Administered 2023-12-15: 80 mg via INTRAVENOUS
  Filled 2023-12-15: qty 20

## 2023-12-15 MED ORDER — ONDANSETRON HCL 4 MG PO TABS: 4.0000 mg | ORAL_TABLET | Freq: Four times a day (QID) | ORAL | Status: DC | PRN

## 2023-12-15 MED ORDER — METHYLPREDNISOLONE SODIUM SUCC 125 MG IJ SOLR
80.0000 mg | Freq: Every day | INTRAMUSCULAR | Status: DC
  Administered 2023-12-15: 80 mg via INTRAVENOUS
  Filled 2023-12-15: qty 2

## 2023-12-15 MED ORDER — DIPHENHYDRAMINE HCL 25 MG PO CAPS
25.0000 mg | ORAL_CAPSULE | Freq: Two times a day (BID) | ORAL | Status: AC
  Administered 2023-12-15 (×2): 25 mg via ORAL
  Filled 2023-12-15 (×2): qty 1

## 2023-12-15 MED ORDER — MORPHINE SULFATE (PF) 2 MG/ML IV SOLN
2.0000 mg | Freq: Once | INTRAVENOUS | Status: AC
  Administered 2023-12-15: 2 mg via INTRAVENOUS
  Filled 2023-12-15: qty 1

## 2023-12-15 MED ORDER — AMLODIPINE BESYLATE 5 MG PO TABS
10.0000 mg | ORAL_TABLET | Freq: Every day | ORAL | Status: DC
Start: 2023-12-15 — End: 2023-12-16
  Administered 2023-12-15: 10 mg via ORAL
  Filled 2023-12-15 (×2): qty 2

## 2023-12-15 MED ORDER — SODIUM CHLORIDE 0.9 % IV SOLN
500.0000 mg | Freq: Once | INTRAVENOUS | Status: AC
  Administered 2023-12-15: 500 mg via INTRAVENOUS
  Filled 2023-12-15: qty 5

## 2023-12-15 MED ORDER — ACETAMINOPHEN 650 MG RE SUPP: 650.0000 mg | Freq: Four times a day (QID) | RECTAL | Status: DC | PRN

## 2023-12-15 MED ORDER — SODIUM CHLORIDE 0.9 % IV SOLN
2.0000 g | Freq: Once | INTRAVENOUS | Status: AC
  Administered 2023-12-15: 2 g via INTRAVENOUS
  Filled 2023-12-15: qty 20

## 2023-12-15 MED ORDER — COLESTIPOL HCL 1 G PO TABS
2.0000 g | ORAL_TABLET | Freq: Two times a day (BID) | ORAL | Status: DC
Start: 2023-12-15 — End: 2023-12-16
  Administered 2023-12-15 – 2023-12-16 (×2): 2 g via ORAL
  Filled 2023-12-15 (×2): qty 2

## 2023-12-15 MED ORDER — IOHEXOL 350 MG/ML SOLN
100.0000 mL | Freq: Once | INTRAVENOUS | Status: AC | PRN
  Administered 2023-12-15: 100 mL via INTRAVENOUS

## 2023-12-15 MED ORDER — POTASSIUM CHLORIDE CRYS ER 20 MEQ PO TBCR
40.0000 meq | EXTENDED_RELEASE_TABLET | Freq: Once | ORAL | Status: AC
  Administered 2023-12-15: 40 meq via ORAL
  Filled 2023-12-15: qty 2

## 2023-12-15 MED ORDER — VANCOMYCIN HCL IN DEXTROSE 1-5 GM/200ML-% IV SOLN
1000.0000 mg | Freq: Once | INTRAVENOUS | Status: AC
  Administered 2023-12-15: 1000 mg via INTRAVENOUS
  Filled 2023-12-15: qty 200

## 2023-12-15 MED ORDER — LACTATED RINGERS IV SOLN: INTRAVENOUS | Status: DC

## 2023-12-15 MED ORDER — ONDANSETRON HCL 4 MG/2ML IJ SOLN: 4.0000 mg | Freq: Four times a day (QID) | INTRAMUSCULAR | Status: DC | PRN

## 2023-12-15 NOTE — ED Notes (Signed)
 Per pt infusion she was getting is remicaid.

## 2023-12-15 NOTE — H&P (Signed)
 History and Physical    Sandra Wilkerson WGN:[id number] DOB: Oct 10, 1995 DOA: 12/15/2023  PCP: Marisue Ivan, NP (Confirm with patient/family/NH records and if not entered, this has to be entered at [facility] point of entry) Patient coming from: Home  I have personally briefly reviewed patient's old medical records in [facility]  Chief Complaint: Rash, chest pain, shortness of breath  HPI: Sandra Wilkerson is a 28 y.o. female with medical history significant of Crohn's disease on Remicade, recently diagnosed HTN on amlodipine, presented with multiple complaints including possible allergy reaction to Remicade, and chest pain and shortness of breath after injection of epinephrine.  Patient was getting regular routine Remicade infusion this morning by her home health nurse however she suddenly developed feeling of itchiness in her face and swelling of cellulitis and tingling sensation of bilateral palms and forearms, soon she also developed wheezy and shortness of breath.  Remicade was stopped and patient was given IV injection of 1 mg epinephrine.  Patient started to feel severe chest pressure, with worsening shortness of breath.  She started to cough of initially pinkish phlegm then become bright red blood 5-6 times and color become darker each time.  At that time as well her, she still have 3/10 chest pain, and shortness of breath has improved.  No cough no fever or chills.  Patient visited PCP this week and was started on amlodipine yesterday when she took the first dose.  No event yesterday.  She told me that she has been on Remicade for the last 9 years without any incident.  ED Course: Borderline tachycardia, tachypneic blood pressure 102/72, O2 saturation 100% on 4 L.  Chest x-ray showed pulmonary edema.  CTA showed pulmonary edema versus inflammation/infection with nodular groundglass opacity throughout bilateral upper lobe and right middle lobe.  Blood work showed WBC 15.7, hemoglobin 13,  K3.3, creatinine 0.6 bicarb 22.  Troponin 247> 590.  EKG showed no acute ST changes.  Patient was given vancomycin ceftriaxone azithromycin, morphine and Zofran in the ED.  Review of Systems: As per HPI otherwise 14 point review of systems negative.    Past Medical History:  Diagnosis Date   Anemia    COVID    Crohn disease (HCC)    PCOS (polycystic ovarian syndrome)     Past Surgical History:  Procedure Laterality Date   APPENDECTOMY     CESAREAN SECTION  07/15/2021   Procedure: CESAREAN SECTION;  Surgeon: Linzie Collin, MD;  Location: [facility] ORS;  Service: Obstetrics;;   CESAREAN SECTION N/A 05/07/2023   Procedure: REPEAT CESAREAN DELIVERY;  Surgeon: Linzie Collin, MD;  Location: [facility] ORS;  Service: Obstetrics;  Laterality: N/A;   CHOLECYSTECTOMY     COLECTOMY     COLOSTOMY     COLOSTOMY REVERSAL     ERCP N/A 11/07/2021   Procedure: ENDOSCOPIC RETROGRADE CHOLANGIOPANCREATOGRAPHY (ERCP);  Surgeon: Midge Minium, MD;  Location: [facility] ENDOSCOPY;  Service: Endoscopy;  Laterality: N/A;     reports that she has never smoked. She has never used smokeless tobacco. She reports that she does not drink alcohol and does not use drugs.  Allergies  Allergen Reactions   Other Anaphylaxis    Sodium Ferric Gluconate    Family History  Problem Relation Age of Onset   Healthy Mother    Asthma Father    Healthy Sister    Healthy Sister    Healthy Sister    Healthy Sister    Healthy Sister    Diabetes  Maternal Grandmother    Alzheimer's disease Maternal Grandmother    Healthy Maternal Grandfather    Congestive Heart Failure Paternal Grandmother      Prior to Admission medications   Medication Sig Start Date End Date Taking? Authorizing Provider  amLODipine (NORVASC) 10 MG tablet Take 1 tablet (10 mg total) by mouth daily. 11/16/23 11/15/24 Yes Laurier Nancy, MD  colestipol (COLESTID) 1 g tablet Take 2 g by mouth 2 (two) times daily. At least 1 hour separate from other  medications   Yes [provider]  fluticasone (FLONASE) 50 MCG/ACT nasal spray Place 1 spray into both nostrils daily. 10/12/23 10/11/24 Yes Scoggins, Amber, NP  inFLIXimab (REMICADE) 100 MG injection Inject into the vein. 09/27/23  Yes [provider]    Physical Exam: Vitals:   12/15/23 1300 12/15/23 1318 12/15/23 1335 12/15/23 1400  BP: 96/73 100/77 102/72 101/71  Pulse: (!) 108  (!) 104 97  Resp: (!) 23  (!) 29 (!) 22  Temp:      TempSrc:      SpO2: 100%  100% 99%  Weight:      Height:        Constitutional: NAD, calm, comfortable Vitals:   12/15/23 1300 12/15/23 1318 12/15/23 1335 12/15/23 1400  BP: 96/73 100/77 102/72 101/71  Pulse: (!) 108  (!) 104 97  Resp: (!) 23  (!) 29 (!) 22  Temp:      TempSrc:      SpO2: 100%  100% 99%  Weight:      Height:       Eyes: PERRL, lids and conjunctivae normal ENMT: Mucous membranes are moist. Posterior pharynx clear of any exudate or lesions.Normal dentition.  Neck: normal, supple, no masses, no thyromegaly Respiratory: clear to auscultation bilaterally, no wheezing, no crackles. Normal respiratory effort. No accessory muscle use.  Cardiovascular: Regular rate and rhythm, no murmurs / rubs / gallops. No extremity edema. 2+ pedal pulses. No carotid bruits.  Abdomen: no tenderness, no masses palpated. No hepatosplenomegaly. Bowel sounds positive.  Musculoskeletal: no clubbing / cyanosis. No joint deformity upper and lower extremities. Good ROM, no contractures. Normal muscle tone.  Skin: no rashes, lesions, ulcers. No induration Neurologic: CN 2-12 grossly intact. Sensation intact, DTR normal. Strength 5/5 in all 4.  Psychiatric: Normal judgment and insight. Alert and oriented x 3. Normal mood.     Labs on Admission: I have personally reviewed following labs and imaging studies  CBC: Recent Labs  Lab 12/15/23 1023  WBC 15.7*  HGB 13.0  HCT 39.6  MCV 83.5  PLT 313   Basic Metabolic Panel: Recent Labs  Lab  12/15/23 1023  NA 135  K 3.3*  CL 108  CO2 22  GLUCOSE 110*  BUN 18  CREATININE 0.68  CALCIUM 8.1*  MG 1.7   GFR: Estimated Creatinine Clearance: 106.3 mL/min (by C-G formula based on SCr of 0.68 mg/dL). Liver Function Tests: Recent Labs  Lab 12/15/23 1023  AST 34  ALT 32  ALKPHOS 60  BILITOT 0.2  PROT 7.2  ALBUMIN 3.3*   Recent Labs  Lab 12/15/23 1023  LIPASE 37   No results for input(s): "AMMONIA" in the last 168 hours. Coagulation Profile: Recent Labs  Lab 12/15/23 1111  INR 1.1   Cardiac Enzymes: No results for input(s): "CKTOTAL", "CKMB", "CKMBINDEX", "TROPONINI" in the last 168 hours. BNP (last 3 results) No results for input(s): "PROBNP" in the last 8760 hours. HbA1C: No results for input(s): "HGBA1C" in the  last 72 hours. CBG: No results for input(s): "GLUCAP" in the last 168 hours. Lipid Profile: No results for input(s): "CHOL", "HDL", "LDLCALC", "TRIG", "CHOLHDL", "LDLDIRECT" in the last 72 hours. Thyroid Function Tests: No results for input(s): "TSH", "T4TOTAL", "FREET4", "T3FREE", "THYROIDAB" in the last 72 hours. Anemia Panel: No results for input(s): "VITAMINB12", "FOLATE", "FERRITIN", "TIBC", "IRON", "RETICCTPCT" in the last 72 hours. Urine analysis:    Component Value Date/Time   COLORURINE YELLOW (A) 03/30/2023 1821   APPEARANCEUR HAZY (A) 03/30/2023 1821   APPEARANCEUR Cloudy (A) 10/26/2022 1613   LABSPEC 1.016 03/30/2023 1821   PHURINE 6.0 03/30/2023 1821   GLUCOSEU Negative 05/03/2023 1426   GLUCOSEU NEGATIVE 03/30/2023 1821   HGBUR NEGATIVE 03/30/2023 1821   BILIRUBINUR neg 05/03/2023 1426   BILIRUBINUR Negative 10/26/2022 1613   KETONESUR NEGATIVE 03/30/2023 1821   PROTEINUR NEGATIVE 03/30/2023 1821   UROBILINOGEN 0.2 05/03/2023 1426   NITRITE neg 05/03/2023 1426   NITRITE NEGATIVE 03/30/2023 1821   LEUKOCYTESUR Negative 05/03/2023 1426   LEUKOCYTESUR TRACE (A) 03/30/2023 1821    Radiological Exams on Admission: DG Chest  Port 1 View Result Date: 12/15/2023 CLINICAL DATA:  Dyspnea EXAM: PORTABLE CHEST 1 VIEW COMPARISON:  X-ray and CTA 12/15/2023 earlier. FINDINGS: Borderline cardiopericardial silhouette. Interstitial and hazy bilateral opacities are seen. Please correlate with recent CT. No pneumothorax or effusion. Overlapping cardiac leads. Film is under penetrated. IMPRESSION: No significant interval change when adjusted for technique compared to the recent prior examination from same day. Electronically Signed   By: Karen Kays M.D.   On: 12/15/2023 14:14   CT Angio Chest/Abd/Pel for Dissection W and/or Wo Contrast Result Date: 12/15/2023 CLINICAL DATA:  Chest pain.  Vomiting blood.  Hypoxic. EXAM: CT ANGIOGRAPHY CHEST, ABDOMEN AND PELVIS TECHNIQUE: Non-contrast CT of the chest was initially obtained. Multidetector CT imaging through the chest, abdomen and pelvis was performed using the standard protocol during bolus administration of intravenous contrast. Multiplanar reconstructed images and MIPs were obtained and reviewed to evaluate the vascular anatomy. RADIATION DOSE REDUCTION: This exam was performed according to the departmental dose-optimization program which includes automated exposure control, adjustment of the mA and/or kV according to patient size and/or use of iterative reconstruction technique. CONTRAST:  OMNIPAQUE IOHEXOL 350 MG/ML SOLN COMPARISON:  08/04/2021 abdomen and pelvis CT. FINDINGS: CTA CHEST FINDINGS Cardiovascular: Pre contrast imaging shows no hyperdense crescent in the wall of the thoracic aorta to suggest acute intramural hematoma. Imaging after IV contrast administration shows no thoracic aortic aneurysm. There is no dissection of the thoracic aorta. Aortic arch branch vessel anatomy opacifies normally. There is no large central embolus in the main or lobar pulmonary arteries. No discernible segmental or subsegmental pulmonary embolus although this exam is not timed for the pulmonary  arteries. Heart is upper limits of normal for size. No pericardial effusion. 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 No mediastinal lymphadenopathy. Lymphadenopathy There is no hilar lymphadenopathy. Lymphadenopathy. The esophagus has normal imaging features. There is no axillary lymphadenopathy. Lungs/Pleura: There is diffuse nodular ground-glass opacities in both upper lobes in the right middle lobe with nodular more confluent ground-glass opacity in the lower lobes associated with interlobular septal thickening. No pleural effusion. No pneumothorax. Musculoskeletal: No worrisome lytic or sclerotic osseous abnormality. Review of the MIP images confirms the above findings. CTA ABDOMEN AND PELVIS FINDINGS VASCULAR Aorta: Normal caliber aorta without aneurysm, dissection, vasculitis or significant stenosis. Celiac: Patent without evidence of aneurysm, dissection, vasculitis or significant stenosis. SMA: Patent  without evidence of aneurysm, dissection, vasculitis or significant stenosis. Renals: Both renal arteries are patent without evidence of aneurysm, dissection, vasculitis, fibromuscular dysplasia or significant stenosis. Accessory left renal artery noted IMA: Patent without evidence of aneurysm, dissection, vasculitis or significant stenosis. Inflow: Patent without evidence of aneurysm, dissection, vasculitis or significant stenosis. Veins: No obvious venous abnormality within the limitations of this arterial phase study. Review of the MIP images confirms the above findings. NON-VASCULAR Hepatobiliary: No suspicious focal abnormality within the liver parenchyma. Nonvisualization of the gallbladder is consistent with cholecystectomy. No intrahepatic or extrahepatic biliary dilation. Pancreas: No focal mass lesion. No dilatation of the main duct. No intraparenchymal cyst. No peripancreatic edema. Spleen: No splenomegaly. No suspicious focal mass lesion.  Adrenals/Urinary Tract: No adrenal nodule or mass. Kidneys unremarkable. No evidence for hydroureter. The urinary bladder appears normal for the degree of distention. Stomach/Bowel: Stomach is unremarkable. No gastric wall thickening. No evidence of outlet obstruction. Duodenum is normally positioned as is the ligament of Treitz. No small bowel wall thickening. No small bowel dilatation. Status post subtotal colectomy. Ileocolic anastomosis noted at the level of the distal sigmoid colon. Lymphatic: No abdominal lymphadenopathy. No pelvic sidewall lymphadenopathy. Reproductive: IUD is visualized in the uterus. Limited assessment of the left adnexal space shows a benign simple appearing left adnexal cyst. No followup imaging is recommended. Other: No intraperitoneal free fluid. Musculoskeletal: No worrisome lytic or sclerotic osseous abnormality. Review of the MIP images confirms the above findings. IMPRESSION: 1. Nodular ground-glass opacity diffusely in the bilateral upper lobes and right middle lobe. The nodular airspace disease becomes confluent in the dependent lung bases where there is associated interlobular septal thickening. Imaging features are compatible with pulmonary edema. Diffuse inflammation/infection could also have this appearance. No substantial pleural effusion. 2. No thoracoabdominal aortic aneurysm. No dissection of the thoracoabdominal aorta. 3. No acute findings in the abdomen or pelvis. Electronically Signed   By: Kennith Center M.D.   On: 12/15/2023 11:16   DG Chest Portable 1 View Result Date: 12/15/2023 CLINICAL DATA:  hypoxic, chest pain EXAM: PORTABLE CHEST 1 VIEW COMPARISON:  11/15/2022 chest radiograph. FINDINGS: Stable cardiomediastinal silhouette with top-normal heart size. No pneumothorax. No pleural effusion. Faint patchy opacities throughout both lungs, new. IMPRESSION: Faint patchy opacities throughout both lungs, new, cannot exclude mild edema or atypical infection. Suggest  chest radiograph follow-up. Electronically Signed   By: Delbert Phenix M.D.   On: 12/15/2023 10:34    EKG: Independently reviewed.  Sinus tachycardia, no acute ST changes.  Assessment/Plan Principal Problem:   Hemoptysis Active Problems:   Pulmonary edema  (please populate well all problems here in Problem List. (For example, if patient is on BP meds at home and you resume or decide to hold them, it is a problem that needs to be her. Same for CAD, COPD, HLD and so on)  Hemoptysis -Pulmonology consultation appreciated.  Pulmonary working up for possible etiology of rheumatoid lung and alveolar hemorrhage, possible bronchial scopic tomorrow. -Other DDx, can also be associated with epinephrine injection.  Trend H&H tonight avoid chemical DVT prophylaxis.  Pneumonia considered to be less likely given the sudden onset of symptoms and clinical progress, will monitor off antibiotics.  Probably anaphylactic reaction -Unclear mechanism associated with Remicade.  Currently there is no symptoms or signs of active angioedema but will prophylactically treat with Solu-Medrol x 1 day andBenadryl x 2 doses  Acute hypoxic respiratory failure Acute pulmonary edema -Likely secondary to epinephrine and hemoptysis, management as above -Discontinue IV fluid  Chest pain and elevated troponins -Likely secondary to epinephrine injection -Trend troponins -Echocardiogram  HTN -Continue amlodipine    DVT prophylaxis: SCD Code Status: Full code Family Communication: Sister at bedside Disposition Plan: Patient is sick with anaphylaxis as well as pulmonary edema probably iatrogenic to epinephrine, requiring close monitoring and likely need inpatient bronchoscopy by pulmonary, expect more than 2 midnight hospital stay Consults called: Pulmonology Admission status: PCU admit   Emeline General MD [facility] Pager [phone] 12/15/2023, 2:37 PM

## 2023-12-15 NOTE — ED Triage Notes (Signed)
 Pt BIB [facility] from Home for allergic reaction, pt HX of Chrons disease. Pt receiving infusions at home for it for last several years. Pt does receive 25mg  Benadryl and 10mg  Zyrtek before infusion. Pt today began having an allergic reaction during infusion, pt describes feeling itchy, swelling to hands and difficulty breathing. Pt home RN gave 1mg  Epi IV and 125mg  Solumedrol. EMS gave 20mg  Famotidine. Pt also complaining of CP. Vitals 130HR, 108/60, 96 RA, 19rr, 32CO2.a

## 2023-12-15 NOTE — Sepsis Progress Note (Signed)
 Elink following code sepsis

## 2023-12-15 NOTE — ED Notes (Signed)
 Pt also having bloody emesis, bright red.

## 2023-12-15 NOTE — Consult Note (Signed)
 CODE SEPSIS - PHARMACY COMMUNICATION  **Broad Spectrum Antibiotics should be administered within 1 hour of Sepsis diagnosis**  Time Code Sepsis Called/Page Received: 1101  Antibiotics Ordered: Azithromycin, Ceftriaxone, Vancomycin  Time of 1st antibiotic administration: 1142  Additional action taken by pharmacy: N/A  If necessary, Name of Provider/Nurse Contacted: N/A  Littie Deeds, PharmD Pharmacy Resident  12/15/2023 11:17 AM

## 2023-12-15 NOTE — ED Provider Notes (Addendum)
 [facility] Provider Note    Event Date/Time   First MD Initiated Contact with Patient 12/15/23 1002     (approximate)   History   Allergic Reaction   HPI  Sandra Wilkerson is a 28 y.o. female past medical history significant for Crohn's disease on chronic Remicade infusion, who presents to the emergency department with concern for an allergic reaction.  Patient was receiving her infusion just prior to arrival.  After the infusion started having significant shortness of breath and itching.  The home health nurse that was giving the infusion and then administered 1 mg of IV epinephrine (this dose was confirmed with EMS multiple times), IV Solu-Medrol and IV Benadryl.  EMS gave IV famotidine.  Patient was stable on arrival and then upon arrival to the emergency department started having episodes where she was either coughing up blood or throwing up blood.  Complaining of some severe chest pain and shortness of breath.  Feels like she cannot catch her breath.  Denies any concern for pregnancy.  Currently on her menstrual cycle.  Does endorse recent cough and congestion and has not been feeling well.     Physical Exam   Triage Vital Signs: ED Triage Vitals  Encounter Vitals Group     BP      Systolic BP Percentile      Diastolic BP Percentile      Pulse      Resp      Temp      Temp src      SpO2      Weight      Height      Head Circumference      Peak Flow      Pain Score      Pain Loc      Pain Education      Exclude from Growth Chart     Most recent vital signs: Vitals:   12/15/23 1015 12/15/23 1100  BP: 101/69 110/78  Pulse: 70 (!) 105  Resp: (!) 27 (!) 25  Temp:  98.5 F (36.9 C)  SpO2: 95% 100%    Physical Exam Constitutional:      General: She is in acute distress.     Appearance: She is well-developed. She is ill-appearing.  HENT:     Head: Atraumatic.  Eyes:     Conjunctiva/sclera: Conjunctivae normal.  Cardiovascular:      Rate and Rhythm: Regular rhythm.     Heart sounds: No murmur heard. Pulmonary:     Effort: Respiratory distress present.     Breath sounds: Rhonchi present. No wheezing.     Comments: Hypoxic to the mid 70s.  Placed on 6 L nasal cannula with improvement to 95% Abdominal:     General: There is no distension.     Tenderness: There is no abdominal tenderness.     Comments: Well-healed abdominal scar  Musculoskeletal:        General: Normal range of motion.     Cervical back: Normal range of motion.  Skin:    General: Skin is warm.     Capillary Refill: Capillary refill takes less than 2 seconds.  Neurological:     Mental Status: She is alert. Mental status is at baseline.     IMPRESSION / MDM / ASSESSMENT AND PLAN / ED COURSE  I reviewed the triage vital signs and the nursing notes.  On arrival patient is ill-appearing, hypoxic and difficult to discern if she  is having hemoptysis or hematemesis.  Immediately called chest x-ray for bedside chest x-ray with concern for pulmonary edema, atypical infection or diffuse alveolar hemorrhage.  Then called back to CT scan for stat CT scan.  Patient adamantly denied any concern for pregnancy.  Differential diagnosis including side effect of receding acardiac dose of IV epinephrine with diffuse alveolar hemorrhage, pneumonia, ACS, pulmonary edema, allergic reaction with anaphylaxis  EKG  I, Corena Herter, the attending physician, personally viewed and interpreted this ECG.   Rate: Normal  Rhythm: Normal sinus  Axis: Normal  Intervals: Normal  ST&T Change: None  No tachycardic or bradycardic dysrhythmias while on cardiac telemetry.  RADIOLOGY I independently reviewed imaging, my interpretation of imaging: Chest x-ray with findings concerning for atypical pneumonia, pulmonary edema or diffuse alveolar hemorrhage  CT on my evaluation concern for possible diffuse alveolar hemorrhage given her hemoptysis.  No obvious signs of a dissection or  perforation.  Questionably pneumonia.  Read as findings concerning for atypical pneumonia versus pulmonary edema.  LABS (all labs ordered are listed, but only abnormal results are displayed) Labs interpreted as -    Labs Reviewed  CBC - Abnormal; Notable for the following components:      Result Value   WBC 15.7 (*)    All other components within normal limits  COMPREHENSIVE METABOLIC PANEL - Abnormal; Notable for the following components:   Potassium 3.3 (*)    Glucose, Bld 110 (*)    Calcium 8.1 (*)    Albumin 3.3 (*)    All other components within normal limits  TROPONIN I (HIGH SENSITIVITY) - Abnormal; Notable for the following components:   Troponin I (High Sensitivity) 247 (*)    All other components within normal limits  CULTURE, BLOOD (ROUTINE X 2)  CULTURE, BLOOD (ROUTINE X 2)  HCG, QUANTITATIVE, PREGNANCY  BRAIN NATRIURETIC PEPTIDE  LIPASE, BLOOD  MAGNESIUM  LACTIC ACID, PLASMA  PROTIME-INR  APTT  LACTIC ACID, PLASMA  TROPONIN I (HIGH SENSITIVITY)     MDM   Clinical Course as of 12/15/23 1204  Sat Dec 15, 2023  1023 Immediately called back to CT scanner for CTA chest abdomen pelvis. [SM]    Clinical Course User Index [SM] Corena Herter, MD   Consulted and discussed the patient's case with the ICU team Dr. Aundria Rud, who immediately came and evaluated the patient.  Also concern for diffuse alveolar hemorrhage likely secondary to receiving IV epinephrine.  Patient is having some improvement of symptoms.  Recommended admission to the hospitalist and will consult with pulmonology.  Recommended n.p.o. for possible bronc today or tomorrow.  Lab work with mild leukocytosis.  Given questionable underlying pneumonia we will add on blood cultures and start on antibiotics, possible recent viral illness so we will add on vancomycin for possible postviral pneumonia, given IV Rocephin and azithromycin to cover for community-acquired pneumonia.  Creatinine at baseline.  On  chart review patient has been seen by cardiology with Dr. Welton Flakes and recommended outpatient stress testing for her chest pain that she has been having.  Consulted hospitalist for admission for acute hypoxic respiratory failure and hemoptysis  PROCEDURES:  Critical Care performed: yes  .Critical Care  Performed by: Corena Herter, MD Authorized by: Corena Herter, MD   Critical care provider statement:    Critical care time (minutes):  50   Critical care time was exclusive of:  Separately billable procedures and treating other patients   Critical care was necessary to treat or prevent imminent or life-threatening  deterioration of the following conditions:  Respiratory failure   Critical care was time spent personally by me on the following activities:  Development of treatment plan with patient or surrogate, discussions with consultants, evaluation of patient's response to treatment, examination of patient, ordering and review of laboratory studies, ordering and review of radiographic studies, ordering and performing treatments and interventions, pulse oximetry, re-evaluation of patient's condition and review of old charts   Care discussed with: admitting provider     Patient's presentation is most consistent with acute presentation with potential threat to life or bodily function.   MEDICATIONS ORDERED IN ED: Medications  lactated ringers infusion ( Intravenous New Bag/Given 12/15/23 1140)  cefTRIAXone (ROCEPHIN) 2 g in sodium chloride 0.9 % 100 mL IVPB (2 g Intravenous New Bag/Given 12/15/23 1142)  azithromycin (ZITHROMAX) 500 mg in sodium chloride 0.9 % 250 mL IVPB (500 mg Intravenous New Bag/Given 12/15/23 1143)  vancomycin (VANCOCIN) IVPB 1000 mg/200 mL premix (1,000 mg Intravenous New Bag/Given 12/15/23 1143)  pantoprazole (PROTONIX) injection 80 mg (80 mg Intravenous Given 12/15/23 1031)  ondansetron (ZOFRAN) 4 MG/2ML injection (4 mg  Given 12/15/23 1027)  sodium chloride 0.9 % bolus 500 mL (0  mLs Intravenous Stopped 12/15/23 1105)  iohexol (OMNIPAQUE) 350 MG/ML injection 100 mL (100 mLs Intravenous Contrast Given 12/15/23 1104)  morphine (PF) 2 MG/ML injection 2 mg (2 mg Intravenous Given 12/15/23 1112)    FINAL CLINICAL IMPRESSION(S) / ED DIAGNOSES   Final diagnoses:  Anaphylaxis, initial encounter  Hypoxia  Hemoptysis  Elevated troponin     Rx / DC Orders   ED Discharge Orders     None        Note:  This document was prepared using Dragon voice recognition software and may include unintentional dictation errors.   Corena Herter, MD 12/15/23 1153    Corena Herter, MD 12/15/23 1204

## 2023-12-15 NOTE — ED Notes (Signed)
 Pt returned from CT

## 2023-12-15 NOTE — Consult Note (Addendum)
 NAME:  Sandra Wilkerson, MRN:  [id number], DOB:  Aug 20, 1996, LOS: 0 ADMISSION DATE:  12/15/2023, CONSULTATION DATE:  12/15/2023 REFERRING MD:  Corena Herter, MD, CHIEF COMPLAINT:  Hemoptysis   History of Present Illness:   Sandra Wilkerson is a pleasant 28 year old female with a past medical history of Crohn's disease maintained on infliximab who presents to the hospital with hemoptysis following an allergic reaction.  Patient was in her usual state of health up until this morning.  She was having a routine infusion of infliximab at home which resulted in an allergic reaction (itchiness, swollen eyes).  The home nurse helping with the infusion administered 1 mg of IV epinephrine following which the patient developed a cough and hemoptysis.  She coughed up blood-tinged sputum prompting evaluation in the ED.  She also has shortness of breath and has been requiring oxygen therapy, currently on 6 L via nasal cannula.  She is denying any chest pain or chest tightness, fevers, chills, or night sweats.  She has not had the symptoms before.  She had no respiratory symptoms yesterday.  There are no reported urinary symptoms or rashes.   In the ED, patient continued to experience hemoptysis though she tells me that the frequency of this and the intensity has improved.  She is requiring 6 L via nasal cannula.  Blood work shows mildly elevated troponin, elevated white blood cell count, and normal kidney function.  She had a CT scan of the chest which I personally reviewed and I note multiple patchy groundglass opacities in all lung fields.  Pulmonary is consulted for evaluation of hemoptysis.  Pertinent  Medical History   -Crohn's disease on long term infliximab (s/p ileocolectomy after r. Sided colonic perforation with ileorectal anastomosis) -Mild intermittent asthma  Objective   Blood pressure 110/78, pulse (!) 105, temperature 98.5 F (36.9 C), temperature source Oral, resp. rate (!) 25, height 5\' 4"  (1.626  m), weight 81.6 kg, SpO2 100%, currently breastfeeding.       No intake or output data in the 24 hours ending 12/15/23 1201 Filed Weights   12/15/23 1019  Weight: 81.6 kg    Examination:  Physical Exam Constitutional:      Appearance: Normal appearance. She is not ill-appearing.  Cardiovascular:     Rate and Rhythm: Normal rate and regular rhythm.     Pulses: Normal pulses.     Heart sounds: Normal heart sounds.  Pulmonary:     Effort: Pulmonary effort is normal.     Breath sounds: Normal breath sounds.  Abdominal:     Palpations: Abdomen is soft.  Neurological:     General: No focal deficit present.     Mental Status: She is alert and oriented to person, place, and time. Mental status is at baseline.      Assessment & Plan:   #Hemoptysis #Acute hypoxic respiratory failure #Anaphylaxis  Pleasant 28 year old female with past medical history of Crohn's disease controlled with infliximab who presents to the hospital following an anaphylactic reaction secondary to infliximab.  She developed hemoptysis and hypoxic respiratory failure prompting consultation with pulmonology.  The differential diagnosis for her symptoms include a pulmonary renal syndrome, diffuse alveolar hemorrhage as a side effect of infliximab, or pulmonary edema secondary to inappropriate dosage of IV epinephrine.  Her kidney function on blood work today is within normal and she has not had any respiratory symptoms prior to this morning to suggest a pulmonary renal syndrome or diffuse alveolar hemorrhage. It is unusual  for infliximab to cause DAH as a direct side effect while being infused, and it is usually more delayed in onset.  I suspect the administration of 1 mg of IV epinephrine resulted in peripheral vasoconstriction and increased cardiac output resulting in increased pulmonary capillary hydrostatic pressures (wedge pressure) resulting in pulmonary edema and bland pulm hemorrhage. The findings on the CT  scan certainly could represent pulmonary edema or diffuse alveolar hemorrhage but I suspect it is the former.  I would recommend continued conservative management at this point with management of her anaphylaxis and serial chest x-rays to assess for improvement.  Should the infiltrates improve, this would further support pulm edema as the underlying etiology.  I would recommend sending a panel for ANA, ANCA, and anti-GBM to evaluate for a pulmonary renal syndrome.  Should symptoms not improve, I will consider bronchoscopy to assess for diffuse alveolar hemorrhage.  -Admit to [facility] -serial CXR's -Wean oxygen as tolerated, goal SpO2 greater than 92% -Check ANA, ANCA, RF, CCP, anti-GBM -Maintain n.p.o. in case of bronchoscopy  Raechel Chute, MD [facility] 12/15/2023 12:17 PM     Labs   CBC: Recent Labs  Lab 12/15/23 1023  WBC 15.7*  HGB 13.0  HCT 39.6  MCV 83.5  PLT 313    Basic Metabolic Panel: Recent Labs  Lab 12/15/23 1023  NA 135  K 3.3*  CL 108  CO2 22  GLUCOSE 110*  BUN 18  CREATININE 0.68  CALCIUM 8.1*  MG 1.7   GFR: Estimated Creatinine Clearance: 106.3 mL/min (by C-G formula based on SCr of 0.68 mg/dL). Recent Labs  Lab 12/15/23 1023  WBC 15.7*    Liver Function Tests: Recent Labs  Lab 12/15/23 1023  AST 34  ALT 32  ALKPHOS 60  BILITOT 0.2  PROT 7.2  ALBUMIN 3.3*   Recent Labs  Lab 12/15/23 1023  LIPASE 37   No results for input(s): "AMMONIA" in the last 168 hours.  ABG No results found for: "PHART", "PCO2ART", "PO2ART", "HCO3", "TCO2", "ACIDBASEDEF", "O2SAT"   Coagulation Profile: No results for input(s): "INR", "PROTIME" in the last 168 hours.  Cardiac Enzymes: No results for input(s): "CKTOTAL", "CKMB", "CKMBINDEX", "TROPONINI" in the last 168 hours.  HbA1C: Hgb A1c MFr Bld  Date/Time Value Ref Range Status  12/21/2020 02:17 PM 5.4 4.8 - 5.6 % Final    Comment:             Prediabetes: 5.7 - 6.4           Diabetes: >6.4          Glycemic control for adults with diabetes: <7.0     CBG: No results for input(s): "GLUCAP" in the last 168 hours.  Review of Systems:   Review of Systems  Constitutional:  Negative for chills, fever and weight loss.  Respiratory:  Positive for cough, hemoptysis and shortness of breath. Negative for sputum production and wheezing.   Cardiovascular:  Negative for chest pain.  Skin:  Negative for rash.     Past Medical History:  She,  has a past medical history of Anemia, COVID, Crohn disease (HCC), and PCOS (polycystic ovarian syndrome).   Surgical History:   Past Surgical History:  Procedure Laterality Date   APPENDECTOMY     CESAREAN SECTION  07/15/2021   Procedure: CESAREAN SECTION;  Surgeon: Linzie Collin, MD;  Location: [facility] ORS;  Service: Obstetrics;;   CESAREAN SECTION N/A 05/07/2023   Procedure: REPEAT CESAREAN DELIVERY;  Surgeon: Brennan Bailey  Fayrene Fearing, MD;  Location: [facility] ORS;  Service: Obstetrics;  Laterality: N/A;   CHOLECYSTECTOMY     COLECTOMY     COLOSTOMY     COLOSTOMY REVERSAL     ERCP N/A 11/07/2021   Procedure: ENDOSCOPIC RETROGRADE CHOLANGIOPANCREATOGRAPHY (ERCP);  Surgeon: Midge Minium, MD;  Location: [facility] ENDOSCOPY;  Service: Endoscopy;  Laterality: N/A;     Social History:   reports that she has never smoked. She has never used smokeless tobacco. She reports that she does not drink alcohol and does not use drugs.   Family History:  Her family history includes Alzheimer's disease in her maternal grandmother; Asthma in her father; Congestive Heart Failure in her paternal grandmother; Diabetes in her maternal grandmother; Healthy in her maternal grandfather, mother, sister, sister, sister, sister, and sister.   Allergies Allergies  Allergen Reactions   Other Anaphylaxis    Sodium Ferric Gluconate     Home Medications  Prior to Admission medications   Medication Sig Start Date End Date Taking? Authorizing Provider   amLODipine (NORVASC) 10 MG tablet Take 1 tablet (10 mg total) by mouth daily. 11/16/23 11/15/24  Laurier Nancy, MD  fluticasone (FLONASE) 50 MCG/ACT nasal spray Place 1 spray into both nostrils daily. 10/12/23 10/11/24  Scoggins, Amber, NP  inFLIXimab (REMICADE) 100 MG injection Inject into the vein. 09/27/23   [provider]      I spent 60 minutes caring for this patient today, including preparing to see the patient, obtaining a medical history , reviewing a separately obtained history, performing a medically appropriate examination and/or evaluation, counseling and educating the patient/family/caregiver, ordering medications, tests, or procedures, documenting clinical information in the electronic health record, and independently interpreting results (not separately reported/billed) and communicating results to the patient/family/caregiver

## 2023-12-15 NOTE — Progress Notes (Signed)
       CROSS COVER NOTE  NAME: CYNDEE GIAMMARCO MRN: [id number] DOB : 01/09/1996 ATTENDING PHYSICIAN: Emeline General, MD    Date of Service   12/15/2023   HPI/Events of Note   Frontal headache without relief from acetaminophen given at 1799. Morphine relief does not las long. Has used excedrin in past for headaches but not fiorcet. Was also recommended in past to use magnesium  Interventions   Assessment/Plan: Fiorcet 2 tabs x 1 dose. Monitor for effectiveness Mag oxide 400 po x1        Donnie Mesa NP [facility] Cross Cover 7pm-7am - check amion for availability Pager [phone]

## 2023-12-16 ENCOUNTER — Inpatient Hospital Stay

## 2023-12-16 ENCOUNTER — Inpatient Hospital Stay: Admit: 2023-12-16

## 2023-12-16 DIAGNOSIS — J9601 Acute respiratory failure with hypoxia: Secondary | ICD-10-CM | POA: Diagnosis not present

## 2023-12-16 DIAGNOSIS — R7989 Other specified abnormal findings of blood chemistry: Secondary | ICD-10-CM | POA: Diagnosis not present

## 2023-12-16 DIAGNOSIS — T782XXA Anaphylactic shock, unspecified, initial encounter: Secondary | ICD-10-CM | POA: Diagnosis not present

## 2023-12-16 DIAGNOSIS — R042 Hemoptysis: Secondary | ICD-10-CM | POA: Diagnosis not present

## 2023-12-16 DIAGNOSIS — J810 Acute pulmonary edema: Secondary | ICD-10-CM | POA: Diagnosis not present

## 2023-12-16 LAB — BASIC METABOLIC PANEL
Anion gap: 6 (ref 5–15)
BUN: 12 mg/dL (ref 6–20)
CO2: 23 mmol/L (ref 22–32)
Calcium: 8.3 mg/dL — ABNORMAL LOW (ref 8.9–10.3)
Chloride: 107 mmol/L (ref 98–111)
Creatinine, Ser: 0.53 mg/dL (ref 0.44–1.00)
GFR, Estimated: >60 mL/min (ref >60)
Glucose, Bld: 109 mg/dL — ABNORMAL HIGH (ref 70–99)
Potassium: 3.8 mmol/L (ref 3.5–5.1)
Sodium: 136 mmol/L (ref 135–145)

## 2023-12-16 LAB — CBC
HCT: 39 % (ref 36.0–46.0)
Hemoglobin: 12.8 g/dL (ref 12.0–15.0)
MCH: 26.7 pg (ref 26.0–34.0)
MCHC: 32.8 g/dL (ref 30.0–36.0)
MCV: 81.3 fL (ref 80.0–100.0)
Platelets: 331 10*3/uL (ref 150–400)
RBC: 4.8 MIL/uL (ref 3.87–5.11)
RDW: 13.1 % (ref 11.5–15.5)
WBC: 15.3 10*3/uL — ABNORMAL HIGH (ref 4.0–10.5)
nRBC: 0 % (ref 0.0–0.2)

## 2023-12-16 LAB — BLOOD CULTURE ID PANEL (REFLEXED) - BCID2

## 2023-12-16 LAB — TROPONIN I (HIGH SENSITIVITY): Troponin I (High Sensitivity): 165 ng/L (ref <18)

## 2023-12-16 MED ORDER — EPINEPHRINE 0.3 MG/0.3ML IJ SOAJ
0.3000 mg | INTRAMUSCULAR | 1 refills | Status: AC | PRN
Start: 2023-12-16 — End: ?

## 2023-12-16 MED ORDER — PREDNISONE 20 MG PO TABS: 20.0000 mg | ORAL_TABLET | Freq: Every day | ORAL | 0 refills | Status: DC

## 2023-12-16 MED ORDER — BUTALBITAL-APAP-CAFFEINE 50-325-40 MG PO TABS
2.0000 | ORAL_TABLET | Freq: Once | ORAL | Status: AC
  Administered 2023-12-16: 2 via ORAL
  Filled 2023-12-16: qty 2

## 2023-12-16 NOTE — ED Notes (Addendum)
 This tech ambulated pt in room to monitor oxygen levels. Initially pts levels were 89 on RA and started to increase above 96 on RA as pt kept walking. Pt stated that she was starting to feel dizzy after about 4 laps in room and wanted to lay back down. RN Delice Bison notified.

## 2023-12-16 NOTE — Progress Notes (Signed)
 NAME:  Sandra Wilkerson, MRN:  [id number], DOB:  10/22/1995, LOS: 1 ADMISSION DATE:  12/15/2023, CHIEF COMPLAINT:  Hemoptysis   History of Present Illness:   Sandra Wilkerson is a pleasant 28 year old female with a past medical history of Crohn's disease maintained on infliximab who presents to the hospital with hemoptysis following an allergic reaction.   Patient was in her usual state of health up until this morning.  She was having a routine infusion of infliximab at home which resulted in an allergic reaction (itchiness, swollen eyes).  The home nurse helping with the infusion administered 1 mg of IV epinephrine following which the patient developed a cough and hemoptysis.  She coughed up blood-tinged sputum prompting evaluation in the ED.  She also has shortness of breath and has been requiring oxygen therapy, currently on 6 L via nasal cannula.  She is denying any chest pain or chest tightness, fevers, chills, or night sweats.  She has not had the symptoms before.  She had no respiratory symptoms yesterday.  There are no reported urinary symptoms or rashes.    In the ED, patient continued to experience hemoptysis though she tells me that the frequency of this and the intensity has improved.  She is requiring 6 L via nasal cannula.  Blood work shows mildly elevated troponin, elevated white blood cell count, and normal kidney function.  She had a CT scan of the chest which I personally reviewed and I note multiple patchy groundglass opacities in all lung fields.  Pulmonary is consulted for evaluation of hemoptysis.  Pertinent  Medical History  -Crohn's disease on long term infliximab (s/p ileocolectomy after r. Sided colonic perforation with ileorectal anastomosis) -Mild intermittent asthma  Interim History / Subjective:  Minimal cough with hemoptysis (dime sized blood mixed with phlegm) overnight. Shortness of breath improved. Does have some chest tightness.  Objective   Blood pressure 103/63,  pulse 70, temperature 98.5 F (36.9 C), temperature source Oral, resp. rate 20, height 5\' 4"  (1.626 m), weight 81.6 kg, SpO2 100%, not currently breastfeeding.       No intake or output data in the 24 hours ending 12/16/23 0942 Filed Weights   12/15/23 1019  Weight: 81.6 kg    Examination:  Physical Exam Constitutional:      Appearance: Normal appearance. She is obese. She is not ill-appearing.  Cardiovascular:     Rate and Rhythm: Normal rate and regular rhythm.     Pulses: Normal pulses.     Heart sounds: Normal heart sounds.  Pulmonary:     Effort: Pulmonary effort is normal.     Breath sounds: No stridor. Rales (RLL) present. No wheezing.  Neurological:     General: No focal deficit present.     Mental Status: She is alert and oriented to person, place, and time. Mental status is at baseline.      Assessment & Plan:   #Hemoptysis #Acute hypoxic respiratory failure #Anaphylaxis   Pleasant 28 year old female with past medical history of Crohn's disease controlled with infliximab who presents to the hospital following an anaphylactic reaction secondary to infliximab.  She developed hemoptysis and hypoxic respiratory failure prompting consultation with pulmonology. She is now back on room air.   The differential diagnosis for her symptoms include a pulmonary renal syndrome, diffuse alveolar hemorrhage as a side effect of infliximab, or pulmonary edema secondary to inappropriate dosage of IV epinephrine.  Her kidney function is within normal and she has not had any respiratory  symptoms prior to the anaphylactic reaction making pulmonary renal syndrome or DAH less likely. It is unusual for infliximab to cause DAH as a direct side effect while being infused, and it is usually more delayed in onset.  I suspect the administration of 1 mg of IV epinephrine resulted in peripheral vasoconstriction and increased cardiac output resulting in increased pulmonary capillary hydrostatic  pressures (PAOP) resulting in pulmonary edema and bland pulm hemorrhage. The findings on the CT scan certainly could represent pulmonary edema or diffuse alveolar hemorrhage but I suspect it is the former.   I would recommend continued conservative management at this point with management of her anaphylaxis. Repeat CXR this AM is re-assuring, and she is weaned to room air. Would recommend trending to ensure no hypoxia with exertion. ANA, ANCA, and anti-GBM sent to rule out an auto-immune process. Given improvement in oxygenation and symptoms, will hold off on bronchoscopy.  -serial CXR's -Wean oxygen as tolerated, goal SpO2 greater than 92%,  trend on room air -Check ANA, ANCA, RF, CCP, anti-GBM -ok to discharge if trends well on RA  Raechel Chute, MD [facility] 12/16/2023 9:47 AM    Labs   CBC: Recent Labs  Lab 12/15/23 1023 12/15/23 1457 12/15/23 2025 12/16/23 0818  WBC 15.7*  --   --  15.3*  HGB 13.0 13.4 13.5 12.8  HCT 39.6 40.5 40.7 39.0  MCV 83.5  --   --  81.3  PLT 313  --   --  331    Basic Metabolic Panel: Recent Labs  Lab 12/15/23 1023  NA 135  K 3.3*  CL 108  CO2 22  GLUCOSE 110*  BUN 18  CREATININE 0.68  CALCIUM 8.1*  MG 1.7   GFR: Estimated Creatinine Clearance: 106.3 mL/min (by C-G formula based on SCr of 0.68 mg/dL). Recent Labs  Lab 12/15/23 1023 12/15/23 1112 12/15/23 1307 12/16/23 0818  WBC 15.7*  --   --  15.3*  LATICACIDVEN  --  1.9 1.9  --     Liver Function Tests: Recent Labs  Lab 12/15/23 1023  AST 34  ALT 32  ALKPHOS 60  BILITOT 0.2  PROT 7.2  ALBUMIN 3.3*   Recent Labs  Lab 12/15/23 1023  LIPASE 37   No results for input(s): "AMMONIA" in the last 168 hours.  ABG No results found for: "PHART", "PCO2ART", "PO2ART", "HCO3", "TCO2", "ACIDBASEDEF", "O2SAT"   Coagulation Profile: Recent Labs  Lab 12/15/23 1111  INR 1.1    Cardiac Enzymes: No results for input(s): "CKTOTAL", "CKMB", "CKMBINDEX",  "TROPONINI" in the last 168 hours.  HbA1C: Hgb A1c MFr Bld  Date/Time Value Ref Range Status  12/21/2020 02:17 PM 5.4 4.8 - 5.6 % Final    Comment:             Prediabetes: 5.7 - 6.4          Diabetes: >6.4          Glycemic control for adults with diabetes: <7.0     CBG: No results for input(s): "GLUCAP" in the last 168 hours.  Review of Systems:   N/A  Past Medical History:  She,  has a past medical history of Anemia, COVID, Crohn disease (HCC), and PCOS (polycystic ovarian syndrome).   Surgical History:   Past Surgical History:  Procedure Laterality Date   APPENDECTOMY     CESAREAN SECTION  07/15/2021   Procedure: CESAREAN SECTION;  Surgeon: Linzie Collin, MD;  Location: [facility] ORS;  Service:  Obstetrics;;   CESAREAN SECTION N/A 05/07/2023   Procedure: REPEAT CESAREAN DELIVERY;  Surgeon: Linzie Collin, MD;  Location: [facility] ORS;  Service: Obstetrics;  Laterality: N/A;   CHOLECYSTECTOMY     COLECTOMY     COLOSTOMY     COLOSTOMY REVERSAL     ERCP N/A 11/07/2021   Procedure: ENDOSCOPIC RETROGRADE CHOLANGIOPANCREATOGRAPHY (ERCP);  Surgeon: Midge Minium, MD;  Location: [facility] ENDOSCOPY;  Service: Endoscopy;  Laterality: N/A;     Social History:   reports that she has never smoked. She has never used smokeless tobacco. She reports that she does not drink alcohol and does not use drugs.   Family History:  Her family history includes Alzheimer's disease in her maternal grandmother; Asthma in her father; Congestive Heart Failure in her paternal grandmother; Diabetes in her maternal grandmother; Healthy in her maternal grandfather, mother, sister, sister, sister, sister, and sister.   Allergies Allergies  Allergen Reactions   Other Anaphylaxis    Sodium Ferric Gluconate     Home Medications  Prior to Admission medications   Medication Sig Start Date End Date Taking? Authorizing Provider  amLODipine (NORVASC) 10 MG tablet Take 1 tablet (10 mg total) by mouth daily.  11/16/23 11/15/24 Yes Laurier Nancy, MD  colestipol (COLESTID) 1 g tablet Take 2 g by mouth 2 (two) times daily. At least 1 hour separate from other medications   Yes [provider]  fluticasone (FLONASE) 50 MCG/ACT nasal spray Place 1 spray into both nostrils daily. 10/12/23 10/11/24 Yes Scoggins, Amber, NP  inFLIXimab (REMICADE) 100 MG injection Inject into the vein. 09/27/23  Yes [provider]      I spent 35 minutes caring for this patient today, including preparing to see the patient, obtaining a medical history , reviewing a separately obtained history, performing a medically appropriate examination and/or evaluation, counseling and educating the patient/family/caregiver, documenting clinical information in the electronic health record, and independently interpreting results (not separately reported/billed) and communicating results to the patient/family/caregiver

## 2023-12-16 NOTE — Discharge Summary (Addendum)
 Physician Discharge Summary   Patient: Sandra Wilkerson MRN: [id number] DOB: March 26, 1996  Admit date:     12/15/2023  Discharge date: 12/16/23  Discharge Physician: Enedina Finner   PCP: Marisue Ivan, NP   Recommendations at discharge:   follow-up PCP in 1 to 2 week resume your Norvasc 10 systolic blood pressure is greater than 120 contact your [facility] gastroenterology regarding follow-up and future infusions of Remicade   Discharge Diagnoses: Principal Problem:   Hemoptysis Active Problems:   Pulmonary edema   MENUCHA DICESARE is a 28 y.o. female with medical history significant of Crohn's disease on Remicade, recently diagnosed HTN on amlodipine, presented with multiple complaints including possible allergy reaction to Remicade, and chest pain and shortness of breath after injection of epinephrine.   Patient was getting regular routine Remicade infusion this morning by her home health nurse however she suddenly developed feeling of itchiness in her face and swelling of cellulitis and tingling sensation of bilateral palms and forearms, soon she also developed wheezy and shortness of breath.  Remicade was stopped and patient was given IV injection of 1 mg epinephrine.  Patient started to feel severe chest pressure, with worsening shortness of breath.  She started to cough of initially pinkish phlegm then become bright red blood 5-6 times and color become darker each time.  CTA showed pulmonary edema versus inflammation/infection with nodular groundglass opacity throughout bilateral upper lobe and right middle lobe .  Hemoptysis acute onset -- differential includes pulmonary hemorrhage in the setting of receiving IV epinephrine/pulmonary edema,anaphylactic reaction secondary to Remicade, diffuse alveolar hemorrhage/pulmonary edema secondary to an appropriate dosage of IV epinephrine -- patient received empiric dose of broad-spectrum antibiotic and Solu-Medrol -- she was seen by pulmonary  Dr.Dgayli-- overall improved and okay from pulmonary standpoint for discharge -- patient was weaned off oxygen. She ambulated and sats remain more than 94 to 95% on room air. -- Repeat chest x-ray yesterday if remains stable. Labs remains stable. -- Patient did have some chest discomfort likely due to inflammation -- will give her couple days of steroids. -- Overall hemodynamically stable except soft blood pressure  Probable anaphylactic reaction to Remicade -- PO steroid -- PRN Benadryl -- advised patient to reach out [facility] for further treatment option/instruction regarding her Crohn's  Elevated troponin appears demand ischemia in the setting of pulmonary edema -- troponins trending down. -- Patient is scheduled to undergo outpatient stress test at [facility] next week  History of hypertension relative soft BP -- holding Norvasc till SBP greater than 120. Patient instructed  H/o Crohn's disease since age 28 --pt followed at [facility]  Patient given a prescription for epinephrine injection intramuscular dose. She and her family as instructed to come to the emergency room if needed. She needs to follow-up with [facility] and her PCP. Patient and family agreeable for discharge and voice understanding.       Consultants: Pulmonary Procedures performed: none  Disposition: Home Diet recommendation:  Discharge Diet Orders (From admission, onward)     Start     Ordered   12/16/23 0000  Diet - low sodium heart healthy        12/16/23 1246   12/16/23 0000  Diet general        12/16/23 1246           Regular diet DISCHARGE MEDICATION: Allergies as of 12/16/2023       Reactions   Other Anaphylaxis   Sodium Ferric Gluconate  Medication List     PAUSE taking these medications    amLODipine 10 MG tablet Wait to take this until your doctor or other care provider tells you to start again. Start taking once your SBP >120 Commonly known as: NORVASC Take 1 tablet (10 mg  total) by mouth daily.       TAKE these medications    colestipol 1 g tablet Commonly known as: COLESTID Take 2 g by mouth 2 (two) times daily. At least 1 hour separate from other medications   EPINEPHrine 0.3 mg/0.3 mL Soaj injection Commonly known as: EpiPen 2-Pak Inject 0.3 mg into the muscle as needed for anaphylaxis.   fluticasone 50 MCG/ACT nasal spray Commonly known as: FLONASE Place 1 spray into both nostrils daily.   inFLIXimab 100 MG injection Commonly known as: REMICADE Inject into the vein.   predniSONE 20 MG tablet Commonly known as: DELTASONE Take 1 tablet (20 mg total) by mouth daily with breakfast.        Follow-up Information     Scoggins, Amber, NP. Schedule an appointment as soon as possible for a visit in 1 week(s).   Specialty: Cardiology Contact information: [address] [place] [address] [phone]                Discharge Exam: Ceasar Mons Weights   12/15/23 1019  Weight: 81.6 kg   Alert and oriented times three respiratory clear to auscultation. No respiratory distress cardiovascular both heart sounds normal no murmur neuro- grossly intact skin warm and dry  Condition at discharge: fair  The results of significant diagnostics from this hospitalization (including imaging, microbiology, ancillary and laboratory) are listed below for reference.   Imaging Studies: DG Chest 2 View Result Date: 12/16/2023 CLINICAL DATA:  [id number].  CHF. EXAM: CHEST - 2 VIEW COMPARISON:  Portable chest yesterday at 6:59 p.m. FINDINGS: The heart size and mediastinal contours are within normal limits. Both lungs are clear. The visualized skeletal structures are unremarkable. IMPRESSION: No evidence of acute chest disease.  Unchanged. Electronically Signed   By: Almira Bar M.D.   On: 12/16/2023 08:00   DG Chest Port 1 View Result Date: 12/15/2023 CLINICAL DATA:  Dyspnea EXAM: PORTABLE CHEST 1 VIEW COMPARISON:  12/15/2023 FINDINGS: Lung volumes are  slightly small but are symmetric and there is bibasilar atelectasis. No pneumothorax or pleural effusion. Cardiac size within normal limits. Pulmonary vascularity is normal. No acute bone abnormality. IMPRESSION: 1. Pulmonary hypoinflation. Electronically Signed   By: Helyn Numbers M.D.   On: 12/15/2023 19:24   DG Chest Port 1 View Result Date: 12/15/2023 CLINICAL DATA:  Dyspnea EXAM: PORTABLE CHEST 1 VIEW COMPARISON:  X-ray and CTA 12/15/2023 earlier. FINDINGS: Borderline cardiopericardial silhouette. Interstitial and hazy bilateral opacities are seen. Please correlate with recent CT. No pneumothorax or effusion. Overlapping cardiac leads. Film is under penetrated. IMPRESSION: No significant interval change when adjusted for technique compared to the recent prior examination from same day. Electronically Signed   By: Karen Kays M.D.   On: 12/15/2023 14:14   CT Angio Chest/Abd/Pel for Dissection W and/or Wo Contrast Result Date: 12/15/2023 CLINICAL DATA:  Chest pain.  Vomiting blood.  Hypoxic. EXAM: CT ANGIOGRAPHY CHEST, ABDOMEN AND PELVIS TECHNIQUE: Non-contrast CT of the chest was initially obtained. Multidetector CT imaging through the chest, abdomen and pelvis was performed using the standard protocol during bolus administration of intravenous contrast. Multiplanar reconstructed images and MIPs were obtained and reviewed to evaluate the vascular anatomy. RADIATION DOSE REDUCTION: This exam  was performed according to the departmental dose-optimization program which includes automated exposure control, adjustment of the mA and/or kV according to patient size and/or use of iterative reconstruction technique. CONTRAST:  OMNIPAQUE IOHEXOL 350 MG/ML SOLN COMPARISON:  08/04/2021 abdomen and pelvis CT. FINDINGS: CTA CHEST FINDINGS Cardiovascular: Pre contrast imaging shows no hyperdense crescent in the wall of the thoracic aorta to suggest acute intramural hematoma. Imaging after IV contrast administration  shows no thoracic aortic aneurysm. There is no dissection of the thoracic aorta. Aortic arch branch vessel anatomy opacifies normally. There is no large central embolus in the main or lobar pulmonary arteries. No discernible segmental or subsegmental pulmonary embolus although this exam is not timed for the pulmonary arteries. Heart is upper limits of normal for size. No pericardial effusion. 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 No mediastinal lymphadenopathy. Lymphadenopathy There is no hilar lymphadenopathy. Lymphadenopathy. The esophagus has normal imaging features. There is no axillary lymphadenopathy. Lungs/Pleura: There is diffuse nodular ground-glass opacities in both upper lobes in the right middle lobe with nodular more confluent ground-glass opacity in the lower lobes associated with interlobular septal thickening. No pleural effusion. No pneumothorax. Musculoskeletal: No worrisome lytic or sclerotic osseous abnormality. Review of the MIP images confirms the above findings. CTA ABDOMEN AND PELVIS FINDINGS VASCULAR Aorta: Normal caliber aorta without aneurysm, dissection, vasculitis or significant stenosis. Celiac: Patent without evidence of aneurysm, dissection, vasculitis or significant stenosis. SMA: Patent without evidence of aneurysm, dissection, vasculitis or significant stenosis. Renals: Both renal arteries are patent without evidence of aneurysm, dissection, vasculitis, fibromuscular dysplasia or significant stenosis. Accessory left renal artery noted IMA: Patent without evidence of aneurysm, dissection, vasculitis or significant stenosis. Inflow: Patent without evidence of aneurysm, dissection, vasculitis or significant stenosis. Veins: No obvious venous abnormality within the limitations of this arterial phase study. Review of the MIP images confirms the above findings. NON-VASCULAR Hepatobiliary: No suspicious focal abnormality within the  liver parenchyma. Nonvisualization of the gallbladder is consistent with cholecystectomy. No intrahepatic or extrahepatic biliary dilation. Pancreas: No focal mass lesion. No dilatation of the main duct. No intraparenchymal cyst. No peripancreatic edema. Spleen: No splenomegaly. No suspicious focal mass lesion. Adrenals/Urinary Tract: No adrenal nodule or mass. Kidneys unremarkable. No evidence for hydroureter. The urinary bladder appears normal for the degree of distention. Stomach/Bowel: Stomach is unremarkable. No gastric wall thickening. No evidence of outlet obstruction. Duodenum is normally positioned as is the ligament of Treitz. No small bowel wall thickening. No small bowel dilatation. Status post subtotal colectomy. Ileocolic anastomosis noted at the level of the distal sigmoid colon. Lymphatic: No abdominal lymphadenopathy. No pelvic sidewall lymphadenopathy. Reproductive: IUD is visualized in the uterus. Limited assessment of the left adnexal space shows a benign simple appearing left adnexal cyst. No followup imaging is recommended. Other: No intraperitoneal free fluid. Musculoskeletal: No worrisome lytic or sclerotic osseous abnormality. Review of the MIP images confirms the above findings. IMPRESSION: 1. Nodular ground-glass opacity diffusely in the bilateral upper lobes and right middle lobe. The nodular airspace disease becomes confluent in the dependent lung bases where there is associated interlobular septal thickening. Imaging features are compatible with pulmonary edema. Diffuse inflammation/infection could also have this appearance. No substantial pleural effusion. 2. No thoracoabdominal aortic aneurysm. No dissection of the thoracoabdominal aorta. 3. No acute findings in the abdomen or pelvis. Electronically Signed   By: Kennith Center M.D.   On: 12/15/2023 11:16   DG Chest Portable 1 View Result Date: 12/15/2023 CLINICAL DATA:  hypoxic, chest pain EXAM: PORTABLE CHEST 1 VIEW COMPARISON:   11/15/2022 chest radiograph. FINDINGS: Stable cardiomediastinal silhouette with top-normal heart size. No pneumothorax. No pleural effusion. Faint patchy opacities throughout both lungs, new. IMPRESSION: Faint patchy opacities throughout both lungs, new, cannot exclude mild edema or atypical infection. Suggest chest radiograph follow-up. Electronically Signed   By: Delbert Phenix M.D.   On: 12/15/2023 10:34    Microbiology: Results for orders placed or performed during the hospital encounter of 12/15/23  Blood Culture (routine x 2)     Status: None (Preliminary result)   Collection Time: 12/15/23 11:11 AM   Specimen: BLOOD LEFT HAND  Result Value Ref Range Status   Specimen Description BLOOD LEFT HAND  Final   Special Requests   Final    BOTTLES DRAWN AEROBIC AND ANAEROBIC Blood Culture results may not be optimal due to an inadequate volume of blood received in culture bottles   Culture   Final    NO GROWTH < 24 HOURS Performed at [facility], [address] [place], [place] [address]    Report Status PENDING  Incomplete  Blood Culture (routine x 2)     Status: None (Preliminary result)   Collection Time: 12/15/23 11:11 AM   Specimen: BLOOD  Result Value Ref Range Status   Specimen Description BLOOD RIGHT ANTECUBITAL  Final   Special Requests   Final    BOTTLES DRAWN AEROBIC AND ANAEROBIC Blood Culture results may not be optimal due to an inadequate volume of blood received in culture bottles   Culture   Final    NO GROWTH < 24 HOURS Performed at [facility], [address] [place], [place] [address]    Report Status PENDING  Incomplete    Labs: CBC: Recent Labs  Lab 12/15/23 1023 12/15/23 1457 12/15/23 2025 12/16/23 0818  WBC 15.7*  --   --  15.3*  HGB 13.0 13.4 13.5 12.8  HCT 39.6 40.5 40.7 39.0  MCV 83.5  --   --  81.3  PLT 313  --   --  331   Basic Metabolic Panel: Recent Labs  Lab 12/15/23 1023 12/16/23 0818  NA 135 136  K 3.3* 3.8  CL  108 107  CO2 22 23  GLUCOSE 110* 109*  BUN 18 12  CREATININE 0.68 0.53  CALCIUM 8.1* 8.3*  MG 1.7  --    Liver Function Tests: Recent Labs  Lab 12/15/23 1023  AST 34  ALT 32  ALKPHOS 60  BILITOT 0.2  PROT 7.2  ALBUMIN 3.3*    Discharge time spent: greater than 30 minutes.  Signed: Enedina Finner, MD [facility] 12/16/2023

## 2023-12-16 NOTE — Discharge Instructions (Signed)
 Patient advised to reach out to her [facility] gastroenterologist regarding follow-up appointment.

## 2023-12-17 LAB — RHEUMATOID FACTOR: Rheumatoid fact SerPl-aCnc: <10 [IU]/mL (ref <14.0)

## 2023-12-17 LAB — ANCA PROFILE
Anti-MPO Antibodies: <0.2 U (ref 0.0–0.9)
Anti-PR3 Antibodies: <0.2 U (ref 0.0–0.9)
Atypical P-ANCA titer: <1:20 {titer}
C-ANCA: <1:20 {titer}
P-ANCA: <1:20 {titer}

## 2023-12-17 LAB — ANA W/REFLEX: Anti Nuclear Antibody (ANA): NEGATIVE

## 2023-12-17 LAB — GLOMERULAR BASEMENT MEMBRANE ANTIBODIES: GBM Ab: <0.2 U (ref 0.0–0.9)

## 2023-12-18 LAB — CULTURE, BLOOD (ROUTINE X 2)

## 2023-12-18 LAB — CYCLIC CITRUL PEPTIDE ANTIBODY, IGG/IGA: CCP Antibodies IgG/IgA: 6 U (ref 0–19)

## 2023-12-20 ENCOUNTER — Ambulatory Visit (INDEPENDENT_AMBULATORY_CARE_PROVIDER_SITE_OTHER): Payer: Medicaid Other

## 2023-12-20 DIAGNOSIS — E669 Obesity, unspecified: Secondary | ICD-10-CM

## 2023-12-20 DIAGNOSIS — I259 Chronic ischemic heart disease, unspecified: Secondary | ICD-10-CM

## 2023-12-20 DIAGNOSIS — R9431 Abnormal electrocardiogram [ECG] [EKG]: Secondary | ICD-10-CM | POA: Diagnosis not present

## 2023-12-20 DIAGNOSIS — R0602 Shortness of breath: Secondary | ICD-10-CM | POA: Diagnosis not present

## 2023-12-20 LAB — CULTURE, BLOOD (ROUTINE X 2): Culture: NO GROWTH

## 2023-12-20 MED ORDER — TECHNETIUM TC 99M SESTAMIBI GENERIC - CARDIOLITE
10.9000 | Freq: Once | INTRAVENOUS | Status: AC | PRN
  Administered 2023-12-20: 10.9 via INTRAVENOUS

## 2023-12-20 MED ORDER — TECHNETIUM TC 99M SESTAMIBI GENERIC - CARDIOLITE
30.8000 | Freq: Once | INTRAVENOUS | Status: AC | PRN
  Administered 2023-12-20: 30.8 via INTRAVENOUS

## 2023-12-21 ENCOUNTER — Ambulatory Visit: Payer: Medicaid Other

## 2023-12-21 DIAGNOSIS — E669 Obesity, unspecified: Secondary | ICD-10-CM

## 2023-12-21 DIAGNOSIS — I351 Nonrheumatic aortic (valve) insufficiency: Secondary | ICD-10-CM | POA: Diagnosis not present

## 2023-12-21 DIAGNOSIS — I361 Nonrheumatic tricuspid (valve) insufficiency: Secondary | ICD-10-CM

## 2023-12-21 DIAGNOSIS — I259 Chronic ischemic heart disease, unspecified: Secondary | ICD-10-CM

## 2023-12-21 DIAGNOSIS — R9431 Abnormal electrocardiogram [ECG] [EKG]: Secondary | ICD-10-CM

## 2023-12-21 DIAGNOSIS — I340 Nonrheumatic mitral (valve) insufficiency: Secondary | ICD-10-CM

## 2023-12-21 DIAGNOSIS — I371 Nonrheumatic pulmonary valve insufficiency: Secondary | ICD-10-CM | POA: Diagnosis not present

## 2023-12-21 DIAGNOSIS — R0602 Shortness of breath: Secondary | ICD-10-CM

## 2023-12-24 ENCOUNTER — Encounter: Payer: Self-pay | Admitting: Cardiovascular Disease

## 2023-12-24 ENCOUNTER — Ambulatory Visit (INDEPENDENT_AMBULATORY_CARE_PROVIDER_SITE_OTHER): Payer: Medicaid Other | Admitting: Cardiovascular Disease

## 2023-12-24 VITALS — BP 122/74 | HR 90 | Ht 59.0 in | Wt 170.0 lb

## 2023-12-24 DIAGNOSIS — R9439 Abnormal result of other cardiovascular function study: Secondary | ICD-10-CM | POA: Diagnosis not present

## 2023-12-24 DIAGNOSIS — I340 Nonrheumatic mitral (valve) insufficiency: Secondary | ICD-10-CM

## 2023-12-24 DIAGNOSIS — I259 Chronic ischemic heart disease, unspecified: Secondary | ICD-10-CM | POA: Diagnosis not present

## 2023-12-24 DIAGNOSIS — E669 Obesity, unspecified: Secondary | ICD-10-CM | POA: Diagnosis not present

## 2023-12-24 DIAGNOSIS — R079 Chest pain, unspecified: Secondary | ICD-10-CM

## 2023-12-24 DIAGNOSIS — R0602 Shortness of breath: Secondary | ICD-10-CM

## 2023-12-24 MED ORDER — METOPROLOL TARTRATE 50 MG PO TABS: ORAL_TABLET | ORAL | 0 refills | Status: DC

## 2023-12-24 NOTE — Progress Notes (Signed)
 Cardiology Office Note   Date:  12/24/2023   ID:  Sandra Wilkerson, DOB 1995/11/14, MRN [id number]  PCP:  Marisue Ivan, NP  Cardiologist:  Adrian Blackwater, MD      History of Present Illness: Sandra Wilkerson is a 28 y.o. female who presents for  Chief Complaint  Patient presents with   Results    ECHO/NST results    Occasional chest pains      Past Medical History:  Diagnosis Date   Anemia    COVID    Crohn disease (HCC)    PCOS (polycystic ovarian syndrome)      Past Surgical History:  Procedure Laterality Date   APPENDECTOMY     CESAREAN SECTION  07/15/2021   Procedure: CESAREAN SECTION;  Surgeon: Linzie Collin, MD;  Location: [facility] ORS;  Service: Obstetrics;;   CESAREAN SECTION N/A 05/07/2023   Procedure: REPEAT CESAREAN DELIVERY;  Surgeon: Linzie Collin, MD;  Location: [facility] ORS;  Service: Obstetrics;  Laterality: N/A;   CHOLECYSTECTOMY     COLECTOMY     COLOSTOMY     COLOSTOMY REVERSAL     ERCP N/A 11/07/2021   Procedure: ENDOSCOPIC RETROGRADE CHOLANGIOPANCREATOGRAPHY (ERCP);  Surgeon: Midge Minium, MD;  Location: [facility] ENDOSCOPY;  Service: Endoscopy;  Laterality: N/A;     Current Outpatient Medications  Medication Sig Dispense Refill   metoprolol tartrate (LOPRESSOR) 50 MG tablet Take 1 tab night before and 2 tab 90 minutes prior to CCTA next morning 3 tablet 0   [Paused] amLODipine (NORVASC) 10 MG tablet Take 1 tablet (10 mg total) by mouth daily. 30 tablet 11   colestipol (COLESTID) 1 g tablet Take 2 g by mouth 2 (two) times daily. At least 1 hour separate from other medications     EPINEPHrine (EPIPEN 2-PAK) 0.3 mg/0.3 mL IJ SOAJ injection Inject 0.3 mg into the muscle as needed for anaphylaxis. 4 each 1   fluticasone (FLONASE) 50 MCG/ACT nasal spray Place 1 spray into both nostrils daily. 48 mL 3   inFLIXimab (REMICADE) 100 MG injection Inject into the vein.     predniSONE (DELTASONE) 20 MG tablet Take 1 tablet (20 mg total) by mouth  daily with breakfast. 5 tablet 0   No current facility-administered medications for this visit.    Allergies:   Other    Social History:   reports that she has never smoked. She has never used smokeless tobacco. She reports that she does not drink alcohol and does not use drugs.   Family History:  family history includes Alzheimer's disease in her maternal grandmother; Asthma in her father; Congestive Heart Failure in her paternal grandmother; Diabetes in her maternal grandmother; Healthy in her maternal grandfather, mother, sister, sister, sister, sister, and sister.    ROS:     Review of Systems  Constitutional: Negative.   HENT: Negative.    Eyes: Negative.   Respiratory: Negative.    Gastrointestinal: Negative.   Genitourinary: Negative.   Musculoskeletal: Negative.   Skin: Negative.   Neurological: Negative.   Endo/Heme/Allergies: Negative.   Psychiatric/Behavioral: Negative.    All other systems reviewed and are negative.     All other systems are reviewed and negative.    PHYSICAL EXAM: VS:  BP 122/74   Pulse 90   Ht 4\' 11"  (1.499 m)   Wt 170 lb (77.1 kg)   SpO2 98%   BMI 34.34 kg/m  , BMI Body mass index is 34.34 kg/m. Last weight:  Wt Readings  from Last 3 Encounters:  12/24/23 170 lb (77.1 kg)  12/15/23 180 lb (81.6 kg)  11/16/23 168 lb (76.2 kg)     Physical Exam Constitutional:      Appearance: Normal appearance.  Cardiovascular:     Rate and Rhythm: Normal rate and regular rhythm.     Heart sounds: Normal heart sounds.  Pulmonary:     Effort: Pulmonary effort is normal.     Breath sounds: Normal breath sounds.  Musculoskeletal:     Right lower leg: No edema.     Left lower leg: No edema.  Neurological:     Mental Status: She is alert.       EKG:   Recent Labs: 12/15/2023: ALT 32; B Natriuretic Peptide 18.2; Magnesium 1.7 12/16/2023: BUN 12; Creatinine, Ser 0.53; Hemoglobin 12.8; Platelets 331; Potassium 3.8; Sodium 136    Lipid  Panel No results found for: "CHOL", "TRIG", "HDL", "CHOLHDL", "VLDL", "LDLCALC", "LDLDIRECT"    Other studies Reviewed: Additional studies/ records that were reviewed today include:  Review of the above records demonstrates:       No data to display            ASSESSMENT AND PLAN:    ICD-10-CM   1. Abnormal nuclear stress test  R94.39 CT CORONARY MORPH W/CTA COR W/SCORE W/CA W/CM &/OR WO/CM    metoprolol tartrate (LOPRESSOR) 50 MG tablet   Equivacal stress test and normal LVEF, advise CCTA    2. Chest pain due to myocardial ischemia, unspecified ischemic chest pain type  I25.9 CT CORONARY MORPH W/CTA COR W/SCORE W/CA W/CM &/OR WO/CM    metoprolol tartrate (LOPRESSOR) 50 MG tablet    3. Obesity (BMI 30-39.9)  E66.9 CT CORONARY MORPH W/CTA COR W/SCORE W/CA W/CM &/OR WO/CM    metoprolol tartrate (LOPRESSOR) 50 MG tablet    4. SOB (shortness of breath)  R06.02 CT CORONARY MORPH W/CTA COR W/SCORE W/CA W/CM &/OR WO/CM    metoprolol tartrate (LOPRESSOR) 50 MG tablet    5. Chest pain, unspecified type  R07.9 CT CORONARY MORPH W/CTA COR W/SCORE W/CA W/CM &/OR WO/CM    metoprolol tartrate (LOPRESSOR) 50 MG tablet    6. Nonrheumatic mitral valve regurgitation  I34.0 CT CORONARY MORPH W/CTA COR W/SCORE W/CA W/CM &/OR WO/CM    metoprolol tartrate (LOPRESSOR) 50 MG tablet   mild       Problem List Items Addressed This Visit       Other   Obesity (BMI 30-39.9)   Relevant Medications   metoprolol tartrate (LOPRESSOR) 50 MG tablet   Other Relevant Orders   CT CORONARY MORPH W/CTA COR W/SCORE W/CA W/CM &/OR WO/CM   Chest pain   Relevant Medications   metoprolol tartrate (LOPRESSOR) 50 MG tablet   Other Relevant Orders   CT CORONARY MORPH W/CTA COR W/SCORE W/CA W/CM &/OR WO/CM   CT CORONARY MORPH W/CTA COR W/SCORE W/CA W/CM &/OR WO/CM   Other Visit Diagnoses       Abnormal nuclear stress test    -  Primary   Equivacal stress test and normal LVEF, advise CCTA   Relevant  Medications   metoprolol tartrate (LOPRESSOR) 50 MG tablet   Other Relevant Orders   CT CORONARY MORPH W/CTA COR W/SCORE W/CA W/CM &/OR WO/CM     SOB (shortness of breath)       Relevant Medications   metoprolol tartrate (LOPRESSOR) 50 MG tablet   Other Relevant Orders   CT CORONARY MORPH W/CTA COR W/SCORE W/CA  W/CM &/OR WO/CM     Nonrheumatic mitral valve regurgitation       mild   Relevant Medications   metoprolol tartrate (LOPRESSOR) 50 MG tablet   Other Relevant Orders   CT CORONARY MORPH W/CTA COR W/SCORE W/CA W/CM &/OR WO/CM          Disposition:   Return in about 3 weeks (around 01/14/2024) for CCTA and f/u.    Total time spent: 30 minutes  Signed,  Adrian Blackwater, MD  12/24/2023 10:24 AM    [facility]

## 2024-01-08 ENCOUNTER — Ambulatory Visit: Admitting: Cardiovascular Disease

## 2024-01-08 ENCOUNTER — Other Ambulatory Visit

## 2024-01-15 ENCOUNTER — Ambulatory Visit: Admitting: Cardiovascular Disease

## 2024-01-31 ENCOUNTER — Ambulatory Visit: Admitting: Cardiology

## 2024-01-31 ENCOUNTER — Encounter: Payer: Self-pay | Admitting: Cardiology

## 2024-01-31 VITALS — BP 136/92 | HR 93 | Ht 59.0 in | Wt 177.0 lb

## 2024-01-31 DIAGNOSIS — Z0100 Encounter for examination of eyes and vision without abnormal findings: Secondary | ICD-10-CM

## 2024-01-31 DIAGNOSIS — N62 Hypertrophy of breast: Secondary | ICD-10-CM

## 2024-01-31 DIAGNOSIS — Z87898 Personal history of other specified conditions: Secondary | ICD-10-CM | POA: Diagnosis not present

## 2024-01-31 DIAGNOSIS — I10 Essential (primary) hypertension: Secondary | ICD-10-CM

## 2024-01-31 DIAGNOSIS — R0683 Snoring: Secondary | ICD-10-CM

## 2024-01-31 NOTE — Progress Notes (Addendum)
 Established Patient Office Visit  Subjective:  Patient ID: Sandra Wilkerson, female    DOB: [birth year]  Age: 28 y.o. MRN: [id number]  Chief Complaint  Patient presents with   Referral    Referral for Breast Reduction    Patient in office to discuss referral. Patient requesting a referral to plastic surgery for a medically necessary breast reduction. Patient previously seen by plastic surgery, was being worked up for breast reduction, became pregnant prior to scheduling surgery. Patient continues to experience neck and shoulder pain, back pain. Patient would like to consult with plastic surgery again for a breast reduction.  Patient also requesting a referral for a routine eye exam. Patient states she did not hear from sleep center to schedule sleep study, will resend referral to sleep center for an in office sleep study.    No other concerns at this time.   Past Medical History:  Diagnosis Date   Anemia    COVID    Crohn disease (HCC)    PCOS (polycystic ovarian syndrome)     Past Surgical History:  Procedure Laterality Date   APPENDECTOMY     CESAREAN SECTION  07/15/2021   Procedure: CESAREAN SECTION;  Surgeon: Zenobia Hila, MD;  Location: [facility] ORS;  Service: Obstetrics;;   CESAREAN SECTION N/A 05/07/2023   Procedure: REPEAT CESAREAN DELIVERY;  Surgeon: Zenobia Hila, MD;  Location: [facility] ORS;  Service: Obstetrics;  Laterality: N/A;   CHOLECYSTECTOMY     COLECTOMY     COLOSTOMY     COLOSTOMY REVERSAL     ERCP N/A 11/07/2021   Procedure: ENDOSCOPIC RETROGRADE CHOLANGIOPANCREATOGRAPHY (ERCP);  Surgeon: Marnee Sink, MD;  Location: [facility] ENDOSCOPY;  Service: Endoscopy;  Laterality: N/A;    Social History   Socioeconomic History   Marital status: Significant Other    Spouse name: Jayson Michael   Number of children: 1   Years of education: 16   Highest education level: Not on file  Occupational History   Occupation: childcare  Tobacco Use   Smoking status: Never    Smokeless tobacco: Never  Vaping Use   Vaping status: Never Used  Substance and Sexual Activity   Alcohol use: No   Drug use: No   Sexual activity: Yes    Partners: Male    Birth control/protection: None  Other Topics Concern   Not on file  Social History Narrative   Not on file   Social Drivers of Health   Financial Resource Strain: Low Risk  (10/05/2022)   Overall Financial Resource Strain (CARDIA)    Difficulty of Paying Living Expenses: Not very hard  Food Insecurity: No Food Insecurity (05/07/2023)   Hunger Vital Sign    Worried About Running Out of Food in the Last Year: Never true    Ran Out of Food in the Last Year: Never true  Transportation Needs: No Transportation Needs (05/07/2023)   PRAPARE - Administrator, Civil Service (Medical): No    Lack of Transportation (Non-Medical): No  Physical Activity: Insufficiently Active (10/05/2022)   Exercise Vital Sign    Days of Exercise per Week: 1 day    Minutes of Exercise per Session: 10 min  Stress: No Stress Concern Present (10/05/2022)   [employer] of Occupational Health - Occupational Stress Questionnaire    Feeling of Stress : Not at all  Social Connections: Moderately Integrated (10/05/2022)   Social Connection and Isolation Panel [NHANES]    Frequency of Communication with Friends and Family: More  than three times a week    Frequency of Social Gatherings with Friends and Family: Three times a week    Attends Religious Services: More than 4 times per year    Active Member of Clubs or Organizations: No    Attends Banker Meetings: Never    Marital Status: Living with partner  Intimate Partner Violence: Not At Risk (05/07/2023)   Humiliation, Afraid, Rape, and Kick questionnaire    Fear of Current or Ex-Partner: No    Emotionally Abused: No    Physically Abused: No    Sexually Abused: No    Family History  Problem Relation Age of Onset   Healthy Mother    Asthma Father     Healthy Sister    Healthy Sister    Healthy Sister    Healthy Sister    Healthy Sister    Diabetes Maternal Grandmother    Alzheimer's disease Maternal Grandmother    Healthy Maternal Grandfather    Congestive Heart Failure Paternal Grandmother     Allergies  Allergen Reactions   Other Anaphylaxis    Sodium Ferric Gluconate    Outpatient Medications Prior to Visit  Medication Sig   [Paused] amLODipine  (NORVASC ) 10 MG tablet Take 1 tablet (10 mg total) by mouth daily.   colestipol  (COLESTID ) 1 g tablet Take 2 g by mouth 2 (two) times daily. At least 1 hour separate from other medications   EPINEPHrine  (EPIPEN  2-PAK) 0.3 mg/0.3 mL IJ SOAJ injection Inject 0.3 mg into the muscle as needed for anaphylaxis.   fluticasone  (FLONASE ) 50 MCG/ACT nasal spray Place 1 spray into both nostrils daily.   inFLIXimab  (REMICADE ) 100 MG injection Inject into the vein.   metoprolol  tartrate (LOPRESSOR ) 50 MG tablet Take 1 tab night before and 2 tab 90 minutes prior to CCTA next morning   predniSONE  (DELTASONE ) 20 MG tablet Take 1 tablet (20 mg total) by mouth daily with breakfast.   No facility-administered medications prior to visit.    Review of Systems  Constitutional: Negative.   HENT: Negative.    Eyes: Negative.   Respiratory: Negative.  Negative for shortness of breath.   Cardiovascular: Negative.  Negative for chest pain.  Gastrointestinal: Negative.  Negative for abdominal pain, constipation and diarrhea.  Genitourinary: Negative.   Musculoskeletal:  Negative for joint pain and myalgias.  Skin: Negative.   Neurological: Negative.  Negative for dizziness and headaches.  Endo/Heme/Allergies: Negative.   All other systems reviewed and are negative.      Objective:   BP (!) 136/92   Pulse 93   Ht 4\' 11"  (1.499 m)   Wt 177 lb (80.3 kg)   SpO2 99%   BMI 35.75 kg/m   Vitals:   01/31/24 0914  BP: (!) 136/92  Pulse: 93  Height: 4\' 11"  (1.499 m)  Weight: 177 lb (80.3 kg)   SpO2: 99%  BMI (Calculated): 35.73    Physical Exam Vitals and nursing note reviewed.  Constitutional:      Appearance: Normal appearance. She is normal weight.  HENT:     Head: Normocephalic and atraumatic.     Nose: Nose normal.     Mouth/Throat:     Mouth: Mucous membranes are moist.  Eyes:     Extraocular Movements: Extraocular movements intact.     Conjunctiva/sclera: Conjunctivae normal.     Pupils: Pupils are equal, round, and reactive to light.  Cardiovascular:     Rate and Rhythm: Normal rate and regular rhythm.  Pulses: Normal pulses.     Heart sounds: Normal heart sounds.  Pulmonary:     Effort: Pulmonary effort is normal.     Breath sounds: Normal breath sounds.  Abdominal:     General: Abdomen is flat. Bowel sounds are normal.     Palpations: Abdomen is soft.  Musculoskeletal:        General: Normal range of motion.     Cervical back: Normal range of motion.  Skin:    General: Skin is warm and dry.  Neurological:     General: No focal deficit present.     Mental Status: She is alert and oriented to person, place, and time.  Psychiatric:        Mood and Affect: Mood normal.        Behavior: Behavior normal.        Thought Content: Thought content normal.        Judgment: Judgment normal.      No results found for any visits on 01/31/24.  Recent Results (from the past 2160 hours)  CBC     Status: Abnormal   Collection Time: 12/15/23 10:23 AM  Result Value Ref Range   WBC 15.7 (H) 4.0 - 10.5 K/uL   RBC 4.74 3.87 - 5.11 MIL/uL   Hemoglobin 13.0 12.0 - 15.0 g/dL   HCT 81.1 91.4 - 78.2 %   MCV 83.5 80.0 - 100.0 fL   MCH 27.4 26.0 - 34.0 pg   MCHC 32.8 30.0 - 36.0 g/dL   RDW 95.6 21.3 - 08.6 %   Platelets 313 150 - 400 K/uL   nRBC 0.0 0.0 - 0.2 %    Comment: Performed at [facility], [address] [place], [place] [address]  Comprehensive metabolic panel     Status: Abnormal   Collection Time: 12/15/23 10:23 AM  Result Value Ref  Range   Sodium 135 135 - 145 mmol/L   Potassium 3.3 (L) 3.5 - 5.1 mmol/L   Chloride 108 98 - 111 mmol/L   CO2 22 22 - 32 mmol/L   Glucose, Bld 110 (H) 70 - 99 mg/dL    Comment: Glucose reference range applies only to samples taken after fasting for at least 8 hours.   BUN 18 6 - 20 mg/dL   Creatinine, Ser 9.62 0.44 - 1.00 mg/dL   Calcium 8.1 (L) 8.9 - 10.3 mg/dL   Total Protein 7.2 6.5 - 8.1 g/dL   Albumin 3.3 (L) 3.5 - 5.0 g/dL   AST 34 15 - 41 U/L   ALT 32 0 - 44 U/L   Alkaline Phosphatase 60 38 - 126 U/L   Total Bilirubin 0.2 0.0 - 1.2 mg/dL   GFR, Estimated >95 >28 mL/min    Comment: (NOTE) Calculated using the CKD-EPI Creatinine Equation (2021)    Anion gap 5 5 - 15    Comment: Performed at [facility], [address] [place], [place] [address]  Troponin I (High Sensitivity)     Status: Abnormal   Collection Time: 12/15/23 10:23 AM  Result Value Ref Range   Troponin I (High Sensitivity) 247 (HH) <18 ng/L    Comment: CRITICAL RESULT CALLED TO, READ BACK BY AND VERIFIED WITH ZACH BRADY AT 1138 12/15/23.PMF (NOTE) Elevated high sensitivity troponin I (hsTnI) values and significant  changes across serial measurements may suggest ACS but many other  chronic and acute conditions are known to elevate hsTnI results.  Refer to the "Links" section for chest pain  algorithms and additional  guidance. Performed at [facility], [address] [place], [place] [address]   hCG, quantitative, pregnancy     Status: None   Collection Time: 12/15/23 10:23 AM  Result Value Ref Range   hCG, Beta Chain, Quant, S <1 <5 mIU/mL    Comment:          GEST. AGE      CONC.  (mIU/mL)   <=1 WEEK        5 - 50     2 WEEKS       50 - 500     3 WEEKS       100 - 10,000     4 WEEKS     1,000 - 30,000     5 WEEKS     3,500 - 115,000   6-8 WEEKS     12,000 - 270,000    12 WEEKS     15,000 - 220,000        FEMALE AND NON-PREGNANT FEMALE:     LESS THAN 5 mIU/mL Performed at  [facility], [address] [place], [place] [address]   Brain natriuretic peptide     Status: None   Collection Time: 12/15/23 10:23 AM  Result Value Ref Range   B Natriuretic Peptide 18.2 0.0 - 100.0 pg/mL    Comment: Performed at [facility], [address] [place], [place] [address]  Lipase, blood     Status: None   Collection Time: 12/15/23 10:23 AM  Result Value Ref Range   Lipase 37 11 - 51 U/L    Comment: Performed at [facility], [address] [place] [address] [place], [place] [address]  Magnesium     Status: None   Collection Time: 12/15/23 10:23 AM  Result Value Ref Range   Magnesium 1.7 1.7 - 2.4 mg/dL    Comment: Performed at [facility], [address] [place], [place] [address]  Protime-INR     Status: None   Collection Time: 12/15/23 11:11 AM  Result Value Ref Range   Prothrombin Time 14.0 11.4 - 15.2 seconds   INR 1.1 0.8 - 1.2    Comment: (NOTE) INR goal varies based on device and disease states. Performed at [facility], [address] [place], [place] [address]   APTT     Status: None   Collection Time: 12/15/23 11:11 AM  Result Value Ref Range   aPTT 27 24 - 36 seconds    Comment: Performed at [facility], [address] [place], [place] [address]  Blood Culture (routine x 2)     Status: None   Collection Time: 12/15/23 11:11 AM   Specimen: BLOOD LEFT HAND  Result Value Ref Range   Specimen Description BLOOD LEFT HAND    Special Requests      BOTTLES DRAWN AEROBIC AND ANAEROBIC Blood Culture results may not be optimal due to an inadequate volume of blood received in culture bottles   Culture      NO GROWTH 5 DAYS Performed at [facility], [address] [place], [place] [address]    Report Status 12/20/2023 FINAL   Blood Culture (routine x 2)     Status: Abnormal   Collection Time: 12/15/23 11:11 AM   Specimen: BLOOD  Result Value Ref Range   Specimen Description      BLOOD RIGHT  ANTECUBITAL Performed at [facility], [address], [place], [place] [address]  Special Requests      BOTTLES DRAWN AEROBIC AND ANAEROBIC Blood Culture results may not be optimal due to an inadequate volume of blood received in culture bottles Performed at [facility], [address] [place], [place] [address]    Culture  Setup Time      AEROBIC BOTTLE ONLY GRAM POSITIVE COCCI CRITICAL RESULT CALLED TO, READ BACK BY AND VERIFIED WITH: ASHLEY MURRAY @1639  12/16/23 MJU    Culture (A)     STAPHYLOCOCCUS EPIDERMIDIS THE SIGNIFICANCE OF ISOLATING THIS ORGANISM FROM A SINGLE VENIPUNCTURE CANNOT BE PREDICTED WITHOUT FURTHER CLINICAL AND CULTURE CORRELATION. SUSCEPTIBILITIES AVAILABLE ONLY ON REQUEST. Performed at [facility], [address], [place], [place] [address]    Report Status 12/18/2023 FINAL   Blood Culture ID Panel (Reflexed)     Status: Abnormal   Collection Time: 12/15/23 11:11 AM  Result Value Ref Range   Enterococcus faecalis NOT DETECTED NOT DETECTED   Enterococcus Faecium NOT DETECTED NOT DETECTED   Listeria monocytogenes NOT DETECTED NOT DETECTED   Staphylococcus species DETECTED (A) NOT DETECTED    Comment: CRITICAL RESULT CALLED TO, READ BACK BY AND VERIFIED WITH: ASHLEY MURRAY @1639  12/16/23 MJU    Staphylococcus aureus (BCID) NOT DETECTED NOT DETECTED   Staphylococcus epidermidis DETECTED (A) NOT DETECTED    Comment: CRITICAL RESULT CALLED TO, READ BACK BY AND VERIFIED WITH: ASHLEY MURRAY @1639  12/16/23 MJU    Staphylococcus lugdunensis NOT DETECTED NOT DETECTED   Streptococcus species NOT DETECTED NOT DETECTED   Streptococcus agalactiae NOT DETECTED NOT DETECTED   Streptococcus pneumoniae NOT DETECTED NOT DETECTED   Streptococcus pyogenes NOT DETECTED NOT DETECTED   A.calcoaceticus-baumannii NOT DETECTED NOT DETECTED   Bacteroides fragilis NOT DETECTED NOT DETECTED   Enterobacterales NOT DETECTED NOT DETECTED   Enterobacter cloacae  complex NOT DETECTED NOT DETECTED   Escherichia coli NOT DETECTED NOT DETECTED   Klebsiella aerogenes NOT DETECTED NOT DETECTED   Klebsiella oxytoca NOT DETECTED NOT DETECTED   Klebsiella pneumoniae NOT DETECTED NOT DETECTED   Proteus species NOT DETECTED NOT DETECTED   Salmonella species NOT DETECTED NOT DETECTED   Serratia marcescens NOT DETECTED NOT DETECTED   Haemophilus influenzae NOT DETECTED NOT DETECTED   Neisseria meningitidis NOT DETECTED NOT DETECTED   Pseudomonas aeruginosa NOT DETECTED NOT DETECTED   Stenotrophomonas maltophilia NOT DETECTED NOT DETECTED   Candida albicans NOT DETECTED NOT DETECTED   Candida auris NOT DETECTED NOT DETECTED   Candida glabrata NOT DETECTED NOT DETECTED   Candida krusei NOT DETECTED NOT DETECTED   Candida parapsilosis NOT DETECTED NOT DETECTED   Candida tropicalis NOT DETECTED NOT DETECTED   Cryptococcus neoformans/gattii NOT DETECTED NOT DETECTED   Methicillin resistance mecA/C NOT DETECTED NOT DETECTED    Comment: Performed at [facility], [address] [place], [place] [address]  Lactic acid, plasma     Status: None   Collection Time: 12/15/23 11:12 AM  Result Value Ref Range   Lactic Acid, Venous 1.9 0.5 - 1.9 mmol/L    Comment: Performed at [facility], [address] [place], [place] [address]  Lactic acid, plasma     Status: None   Collection Time: 12/15/23  1:07 PM  Result Value Ref Range   Lactic Acid, Venous 1.9 0.5 - 1.9 mmol/L    Comment: Performed at [facility], [address] [place], [place] [address]  Troponin I (High Sensitivity)     Status: Abnormal   Collection Time: 12/15/23  1:07  PM  Result Value Ref Range   Troponin I (High Sensitivity) 590 (HH) <18 ng/L    Comment: CRITICAL VALUE NOTED. VALUE IS CONSISTENT WITH PREVIOUSLY REPORTED/CALLED VALUE MJU (NOTE) Elevated high sensitivity troponin I (hsTnI) values and significant  changes across serial measurements may suggest ACS but  many other  chronic and acute conditions are known to elevate hsTnI results.  Refer to the "Links" section for chest pain algorithms and additional  guidance. Performed at [facility], [address] [place], [place] [address]   ANA w/Reflex     Status: None   Collection Time: 12/15/23  1:07 PM  Result Value Ref Range   Anti Nuclear Antibody (ANA) Negative Negative    Comment: (NOTE) Performed At: [facility] [address] [place], [place] [id number] Pearlean Botts MD [id number]   ANCA Profile     Status: None   Collection Time: 12/15/23  1:07 PM  Result Value Ref Range   Anti-MPO Antibodies <0.2 0.0 - 0.9 units   Anti-PR3 Antibodies <0.2 0.0 - 0.9 units   C-ANCA <1:20 Neg:<1:20 titer   P-ANCA <1:20 Neg:<1:20 titer    Comment: (NOTE) The presence of positive fluorescence exhibiting P-ANCA or C-ANCA patterns alone is not specific for the diagnosis of Wegener's Granulomatosis (WG) or microscopic polyangiitis. Decisions about treatment should not be based solely on ANCA IFA results.  The [employer] ANCA [employer] Consensus recommends follow up testing of positive sera with both PR-3 and MPO-ANCA enzyme immunoassays. As many as 5% serum samples are positive only by EIA. Ref. AM J Clin Pathol 1999;111:507-513.    Atypical P-ANCA titer <1:20 Neg:<1:20 titer    Comment: (NOTE) The atypical pANCA pattern has been observed in a significant percentage of patients with ulcerative colitis, primary sclerosing cholangitis and autoimmune hepatitis. Performed At: [facility] [address] [place], [place] [id number] Pearlean Botts MD [id number]   Glomerular basement membrane antibodies     Status: None   Collection Time: 12/15/23  1:07 PM  Result Value Ref Range   GBM Ab <0.2 0.0 - 0.9 units    Comment: (NOTE) Performed At: [facility] [address] [place], [place] [id number] Pearlean Botts MD [id number]   Rheumatoid factor      Status: None   Collection Time: 12/15/23  1:07 PM  Result Value Ref Range   Rheumatoid fact SerPl-aCnc <10.0 <14.0 IU/mL    Comment: (NOTE) Performed At: [facility] [address] [place], [place] [id number] Pearlean Botts MD [id number]   CYCLIC CITRUL PEPTIDE ANTIBODY, IGG/IGA     Status: None   Collection Time: 12/15/23  1:07 PM  Result Value Ref Range   CCP Antibodies IgG/IgA 6 0 - 19 units    Comment: (NOTE)                          Negative               <20                          Weak positive      20 - 39                          Moderate positive  40 - 59  Strong positive        >59 Performed At: [facility] [address] [place], [place] [id number] Pearlean Botts MD [id number]   HIV Antibody (routine testing w rflx)     Status: None   Collection Time: 12/15/23  1:07 PM  Result Value Ref Range   HIV Screen 4th Generation wRfx Non Reactive Non Reactive    Comment: Performed at [facility], [address], [place], [place] [address]  Hemoglobin and hematocrit, blood     Status: None   Collection Time: 12/15/23  2:57 PM  Result Value Ref Range   Hemoglobin 13.4 12.0 - 15.0 g/dL   HCT 96.2 95.2 - 84.1 %    Comment: Performed at [facility], [address] [place], [place] [address]  Troponin I (High Sensitivity)     Status: Abnormal   Collection Time: 12/15/23  4:12 PM  Result Value Ref Range   Troponin I (High Sensitivity) 502 (HH) <18 ng/L    Comment: CRITICAL VALUE NOTED. VALUE IS CONSISTENT WITH PREVIOUSLY REPORTED/CALLED VALUE MJU (NOTE) Elevated high sensitivity troponin I (hsTnI) values and significant  changes across serial measurements may suggest ACS but many other  chronic and acute conditions are known to elevate hsTnI results.  Refer to the "Links" section for chest pain algorithms and additional  guidance. Performed at [facility], [address] [place], [place]  [address]   Hemoglobin and hematocrit, blood     Status: None   Collection Time: 12/15/23  8:25 PM  Result Value Ref Range   Hemoglobin 13.5 12.0 - 15.0 g/dL   HCT 53.6 64.4 - 03.4 %    Comment: Performed at [facility], [address] [place], [place] [address]  CBC     Status: Abnormal   Collection Time: 12/16/23  8:18 AM  Result Value Ref Range   WBC 15.3 (H) 4.0 - 10.5 K/uL   RBC 4.80 3.87 - 5.11 MIL/uL   Hemoglobin 12.8 12.0 - 15.0 g/dL   HCT 56.3 87.5 - 64.3 %   MCV 81.3 80.0 - 100.0 fL   MCH 26.7 26.0 - 34.0 pg   MCHC 32.8 30.0 - 36.0 g/dL   RDW 32.9 51.8 - 84.1 %   Platelets 331 150 - 400 K/uL   nRBC 0.0 0.0 - 0.2 %    Comment: Performed at [facility], [address] [place], [place] [address]  Basic metabolic panel     Status: Abnormal   Collection Time: 12/16/23  8:18 AM  Result Value Ref Range   Sodium 136 135 - 145 mmol/L   Potassium 3.8 3.5 - 5.1 mmol/L   Chloride 107 98 - 111 mmol/L   CO2 23 22 - 32 mmol/L   Glucose, Bld 109 (H) 70 - 99 mg/dL    Comment: Glucose reference range applies only to samples taken after fasting for at least 8 hours.   BUN 12 6 - 20 mg/dL   Creatinine, Ser 0.16 0.44 - 1.00 mg/dL   Calcium 8.3 (L) 8.9 - 10.3 mg/dL   GFR, Estimated >01 >09 mL/min    Comment: (NOTE) Calculated using the CKD-EPI Creatinine Equation (2021)    Anion gap 6 5 - 15    Comment: Performed at [facility], [address] [place], [place] [address]  Troponin I (High Sensitivity)     Status: Abnormal   Collection Time: 12/16/23  8:18 AM  Result Value Ref Range   Troponin  I (High Sensitivity) 165 (HH) <18 ng/L    Comment: CRITICAL VALUE NOTED. VALUE IS CONSISTENT WITH PREVIOUSLY REPORTED/CALLED VALUE. JAG (NOTE) Elevated high sensitivity troponin I (hsTnI) values and significant  changes across serial measurements may suggest ACS but many other  chronic and acute conditions are known to elevate hsTnI results.  Refer to the  "Links" section for chest pain algorithms and additional  guidance. Performed at [facility], [address] [place], [place] [address]       Assessment & Plan:  Referral sent to plastic surgery Referral sent to ophthalmology Referral sent to sleep studies for in office sleep study  Problem List Items Addressed This Visit       Other   History of apnea   Relevant Orders   Ambulatory referral to Sleep Studies   Large breasts - Primary   Relevant Orders   Ambulatory referral to Plastic Surgery   Snoring   Relevant Orders   Ambulatory referral to Sleep Studies   Eye exam, routine   Relevant Orders   Ambulatory referral to Ophthalmology    Return if symptoms worsen or fail to improve.   Total time spent: 25 minutes  [employer], NP  01/31/2024   This document may have been prepared by Dragon Voice Recognition software and as such may include unintentional dictation errors.

## 2024-02-21 ENCOUNTER — Institutional Professional Consult (permissible substitution): Admitting: Plastic Surgery

## 2024-03-26 ENCOUNTER — Ambulatory Visit: Attending: Sleep Medicine

## 2024-03-26 DIAGNOSIS — R0683 Snoring: Secondary | ICD-10-CM | POA: Diagnosis not present

## 2024-03-26 DIAGNOSIS — G4733 Obstructive sleep apnea (adult) (pediatric): Secondary | ICD-10-CM | POA: Diagnosis present

## 2024-03-28 NOTE — Progress Notes (Addendum)
 Infusions scheduled:    Skyrizi  Q8W approved for $4 copay.   Skyrizi  requires three IV inductions doses completed at weeks 0, 4, and 8 with the maintenance subcutaneous dose starting at week 12.   Sandra Wilkerson completed 1st and 2nd IV induction doses on 5/13 & 6/10. The 3rd induction IV dose is scheduled for 7/8.     Patient outreach is scheduled the week of July 21st which is 2 weeks before first subcutaneous dose is due on 8/5.  Sent patient Mychart message with link to video and communicated outreach.     7/16 update:   3rd IV induction dose completed 7/15  1st OBI due: 8/12      Sandra Wilkerson, PharmD   [facility]  [address], [place], [place] [address]  Phone: [phone] - Fax. [phone]

## 2024-04-09 ENCOUNTER — Telehealth: Payer: Self-pay

## 2024-04-09 NOTE — Telephone Encounter (Signed)
 Patient states she had a sleep study done 2 weeks ago and is requesting results.

## 2024-04-10 NOTE — Telephone Encounter (Signed)
 Sent message via [employer]

## 2024-04-21 ENCOUNTER — Ambulatory Visit (INDEPENDENT_AMBULATORY_CARE_PROVIDER_SITE_OTHER): Admitting: Cardiology

## 2024-04-21 ENCOUNTER — Encounter: Payer: Self-pay | Admitting: Cardiology

## 2024-04-21 VITALS — BP 127/85 | HR 91 | Ht 59.0 in | Wt 180.2 lb

## 2024-04-21 DIAGNOSIS — R0683 Snoring: Secondary | ICD-10-CM | POA: Diagnosis not present

## 2024-04-21 DIAGNOSIS — G4733 Obstructive sleep apnea (adult) (pediatric): Secondary | ICD-10-CM | POA: Insufficient documentation

## 2024-04-21 DIAGNOSIS — Z0130 Encounter for examination of blood pressure without abnormal findings: Secondary | ICD-10-CM

## 2024-04-21 NOTE — Progress Notes (Signed)
 Established Patient Office Visit  Subjective:  Patient ID: Sandra Wilkerson, female    DOB: 02/27/1996  Age: 28 y.o. MRN: [id number]  Chief Complaint  Patient presents with   Follow-up    Sleep study results.     Patient in office to discuss sleep study results. Recent sleep study revealed mild obstructive sleep apnea, recommend titration CPAP study. Explained results to patient, questions answered. Will refer patient for CPAP titration study.     No other concerns at this time.   Past Medical History:  Diagnosis Date   Anemia    COVID    Crohn disease (HCC)    PCOS (polycystic ovarian syndrome)     Past Surgical History:  Procedure Laterality Date   APPENDECTOMY     CESAREAN SECTION  07/15/2021   Procedure: CESAREAN SECTION;  Surgeon: Janit Alm Agent, MD;  Location: [facility] ORS;  Service: Obstetrics;;   CESAREAN SECTION N/A 05/07/2023   Procedure: REPEAT CESAREAN DELIVERY;  Surgeon: Janit Alm Agent, MD;  Location: [facility] ORS;  Service: Obstetrics;  Laterality: N/A;   CHOLECYSTECTOMY     COLECTOMY     COLOSTOMY     COLOSTOMY REVERSAL     ERCP N/A 11/07/2021   Procedure: ENDOSCOPIC RETROGRADE CHOLANGIOPANCREATOGRAPHY (ERCP);  Surgeon: Jinny Carmine, MD;  Location: [facility] ENDOSCOPY;  Service: Endoscopy;  Laterality: N/A;    Social History   Socioeconomic History   Marital status: Significant Other    Spouse name: Darold   Number of children: 1   Years of education: 16   Highest education level: Not on file  Occupational History   Occupation: childcare  Tobacco Use   Smoking status: Never   Smokeless tobacco: Never  Vaping Use   Vaping status: Never Used  Substance and Sexual Activity   Alcohol use: No   Drug use: No   Sexual activity: Yes    Partners: Male    Birth control/protection: None  Other Topics Concern   Not on file  Social History Narrative   Not on file   Social Drivers of Health   Financial Resource Strain: Low Risk  (10/05/2022)    Overall Financial Resource Strain (CARDIA)    Difficulty of Paying Living Expenses: Not very hard  Food Insecurity: No Food Insecurity (05/07/2023)   Hunger Vital Sign    Worried About Running Out of Food in the Last Year: Never true    Ran Out of Food in the Last Year: Never true  Transportation Needs: No Transportation Needs (05/07/2023)   PRAPARE - Administrator, Civil Service (Medical): No    Lack of Transportation (Non-Medical): No  Physical Activity: Insufficiently Active (10/05/2022)   Exercise Vital Sign    Days of Exercise per Week: 1 day    Minutes of Exercise per Session: 10 min  Stress: No Stress Concern Present (10/05/2022)   [employer] of Occupational Health - Occupational Stress Questionnaire    Feeling of Stress : Not at all  Social Connections: Moderately Integrated (10/05/2022)   Social Connection and Isolation Panel    Frequency of Communication with Friends and Family: More than three times a week    Frequency of Social Gatherings with Friends and Family: Three times a week    Attends Religious Services: More than 4 times per year    Active Member of Clubs or Organizations: No    Attends Banker Meetings: Never    Marital Status: Living with partner  Intimate Partner Violence: Not  At Risk (05/07/2023)   Humiliation, Afraid, Rape, and Kick questionnaire    Fear of Current or Ex-Partner: No    Emotionally Abused: No    Physically Abused: No    Sexually Abused: No    Family History  Problem Relation Age of Onset   Healthy Mother    Asthma Father    Healthy Sister    Healthy Sister    Healthy Sister    Healthy Sister    Healthy Sister    Diabetes Maternal Grandmother    Alzheimer's disease Maternal Grandmother    Healthy Maternal Grandfather    Congestive Heart Failure Paternal Grandmother     Allergies  Allergen Reactions   Other Anaphylaxis    Sodium Ferric Gluconate    Outpatient Medications Prior to Visit   Medication Sig   colestipol  (COLESTID ) 1 g tablet Take 2 g by mouth 2 (two) times daily. At least 1 hour separate from other medications   EPINEPHrine  (EPIPEN  2-PAK) 0.3 mg/0.3 mL IJ SOAJ injection Inject 0.3 mg into the muscle as needed for anaphylaxis.   fluticasone  (FLONASE ) 50 MCG/ACT nasal spray Place 1 spray into both nostrils daily.   Risankizumab-rzaa (SKYRIZI) 360 MG/2.4ML SOCT Inject 360 mg into the skin.   [Paused] amLODipine  (NORVASC ) 10 MG tablet Take 1 tablet (10 mg total) by mouth daily. (Patient not taking: Reported on 04/21/2024)   inFLIXimab  (REMICADE ) 100 MG injection Inject into the vein. (Patient not taking: Reported on 04/21/2024)   metoprolol  tartrate (LOPRESSOR ) 50 MG tablet Take 1 tab night before and 2 tab 90 minutes prior to CCTA next morning (Patient not taking: Reported on 04/21/2024)   predniSONE  (DELTASONE ) 20 MG tablet Take 1 tablet (20 mg total) by mouth daily with breakfast. (Patient not taking: Reported on 04/21/2024)   No facility-administered medications prior to visit.    Review of Systems  Constitutional: Negative.   HENT: Negative.    Eyes: Negative.   Respiratory: Negative.  Negative for shortness of breath.   Cardiovascular: Negative.  Negative for chest pain.  Gastrointestinal: Negative.  Negative for abdominal pain, constipation and diarrhea.  Genitourinary: Negative.   Musculoskeletal:  Negative for joint pain and myalgias.  Skin: Negative.   Neurological: Negative.  Negative for dizziness and headaches.  Endo/Heme/Allergies: Negative.   All other systems reviewed and are negative.      Objective:   BP 127/85   Pulse 91   Ht 4' 11 (1.499 m)   Wt 180 lb 3.2 oz (81.7 kg)   SpO2 98%   BMI 36.40 kg/m   Vitals:   04/21/24 1309  BP: 127/85  Pulse: 91  Height: 4' 11 (1.499 m)  Weight: 180 lb 3.2 oz (81.7 kg)  SpO2: 98%  BMI (Calculated): 36.38    Physical Exam Vitals and nursing note reviewed.  Constitutional:      Appearance:  Normal appearance. She is normal weight.  HENT:     Head: Normocephalic and atraumatic.     Nose: Nose normal.     Mouth/Throat:     Mouth: Mucous membranes are moist.  Eyes:     Extraocular Movements: Extraocular movements intact.     Conjunctiva/sclera: Conjunctivae normal.     Pupils: Pupils are equal, round, and reactive to light.  Cardiovascular:     Rate and Rhythm: Normal rate and regular rhythm.     Pulses: Normal pulses.     Heart sounds: Normal heart sounds.  Pulmonary:     Effort: Pulmonary effort is  normal.     Breath sounds: Normal breath sounds.  Abdominal:     General: Abdomen is flat. Bowel sounds are normal.     Palpations: Abdomen is soft.  Musculoskeletal:        General: Normal range of motion.     Cervical back: Normal range of motion.  Skin:    General: Skin is warm and dry.  Neurological:     General: No focal deficit present.     Mental Status: She is alert and oriented to person, place, and time.  Psychiatric:        Mood and Affect: Mood normal.        Behavior: Behavior normal.        Thought Content: Thought content normal.        Judgment: Judgment normal.      No results found for any visits on 04/21/24.  No results found for this or any previous visit (from the past 2160 hours).    Assessment & Plan:  Will refer patient for CPAP titration study.   Problem List Items Addressed This Visit       Respiratory   Obstructive sleep apnea syndrome - Primary     Other   Snoring    Return if symptoms worsen or fail to improve.   Total time spent: 25 minutes  [employer], NP  04/21/2024   This document may have been prepared by Dragon Voice Recognition software and as such may include unintentional dictation errors.

## 2024-05-13 ENCOUNTER — Ambulatory Visit (INDEPENDENT_AMBULATORY_CARE_PROVIDER_SITE_OTHER): Admitting: Plastic Surgery

## 2024-05-13 VITALS — BP 129/86 | HR 69 | Ht 59.0 in | Wt 181.0 lb

## 2024-05-13 DIAGNOSIS — M549 Dorsalgia, unspecified: Secondary | ICD-10-CM

## 2024-05-13 DIAGNOSIS — M542 Cervicalgia: Secondary | ICD-10-CM

## 2024-05-13 DIAGNOSIS — N62 Hypertrophy of breast: Secondary | ICD-10-CM | POA: Insufficient documentation

## 2024-05-13 DIAGNOSIS — Z6836 Body mass index (BMI) 36.0-36.9, adult: Secondary | ICD-10-CM | POA: Diagnosis not present

## 2024-05-13 DIAGNOSIS — G4733 Obstructive sleep apnea (adult) (pediatric): Secondary | ICD-10-CM

## 2024-05-13 DIAGNOSIS — Z803 Family history of malignant neoplasm of breast: Secondary | ICD-10-CM

## 2024-05-13 NOTE — Progress Notes (Signed)
 Patient ID: Sandra Wilkerson, female    DOB: 08-Jun-1996, 28 y.o.   MRN: [id number]   Chief Complaint  Patient presents with   Advice Only   Skin Problem    Mammary Hyperplasia: The patient is a 28 y.o. female with a history of mammary hyperplasia for several years.  She has extremely large breasts causing symptoms that include the following: Back pain in the upper and lower back, including neck pain. She pulls or pins her bra straps to provide better lift and relief of the pressure and pain. She notices relief by holding her breast up manually.  Her shoulder straps cause grooves and pain and pressure that requires padding for relief. Pain medication is sometimes required with motrin  and tylenol .  Activities that are hindered by enlarged breasts include: exercise and running.  She has tried supportive clothing as well as fitted bras without improvement.  Her breasts are extremely large and fairly symmetric.  She has hyperpigmentation of the inframammary area on both sides.  The sternal to nipple distance on the right is 40 cm and the left is 40 cm.  The IMF distance is 23 cm.  She is 4 feet 11 inches tall and weighs 181 pounds.  The BMI = 36.6 kg/m.  Preoperative bra size = M cup.  The estimated excess breast tissue to be removed at the time of surgery = 550 grams on the left and 550 grams on the right.  Mammogram history: none.  Family history of breast cancer:  maternal grandmother.  Tobacco use:  none.   The patient expresses the desire to pursue surgical intervention.  The patient has a 31-year-old and stopped breast-feeding 9 months ago.  She does have some past medical histories including polycystic ovary disease and Crohn's.  She has also had C-section and an appendectomy with a colostomy reversal.     Review of Systems  Constitutional:  Positive for activity change. Negative for appetite change.  Eyes: Negative.   Respiratory: Negative.    Cardiovascular: Negative.    Gastrointestinal: Negative.   Endocrine: Negative.   Genitourinary: Negative.   Musculoskeletal:  Positive for back pain and neck pain.  Skin:  Positive for rash.    Past Medical History:  Diagnosis Date   Anemia    COVID    Crohn disease (HCC)    PCOS (polycystic ovarian syndrome)     Past Surgical History:  Procedure Laterality Date   APPENDECTOMY     CESAREAN SECTION  07/15/2021   Procedure: CESAREAN SECTION;  Surgeon: Janit Alm Agent, MD;  Location: [facility] ORS;  Service: Obstetrics;;   CESAREAN SECTION N/A 05/07/2023   Procedure: REPEAT CESAREAN DELIVERY;  Surgeon: Janit Alm Agent, MD;  Location: [facility] ORS;  Service: Obstetrics;  Laterality: N/A;   CHOLECYSTECTOMY     COLECTOMY     COLOSTOMY     COLOSTOMY REVERSAL     ERCP N/A 11/07/2021   Procedure: ENDOSCOPIC RETROGRADE CHOLANGIOPANCREATOGRAPHY (ERCP);  Surgeon: Jinny Carmine, MD;  Location: [facility] ENDOSCOPY;  Service: Endoscopy;  Laterality: N/A;      Current Outpatient Medications:    [Paused] amLODipine  (NORVASC ) 10 MG tablet, Take 1 tablet (10 mg total) by mouth daily., Disp: 30 tablet, Rfl: 11   colestipol  (COLESTID ) 1 g tablet, Take 2 g by mouth 2 (two) times daily. At least 1 hour separate from other medications, Disp: , Rfl:    EPINEPHrine  (EPIPEN  2-PAK) 0.3 mg/0.3 mL IJ SOAJ injection, Inject 0.3 mg into the  muscle as needed for anaphylaxis., Disp: 4 each, Rfl: 1   fluticasone  (FLONASE ) 50 MCG/ACT nasal spray, Place 1 spray into both nostrils daily., Disp: 48 mL, Rfl: 3   Risankizumab-rzaa (SKYRIZI) 360 MG/2.4ML SOCT, Inject 360 mg into the skin., Disp: , Rfl:    inFLIXimab  (REMICADE ) 100 MG injection, Inject into the vein. (Patient not taking: Reported on 05/13/2024), Disp: , Rfl:    metoprolol  tartrate (LOPRESSOR ) 50 MG tablet, Take 1 tab night before and 2 tab 90 minutes prior to CCTA next morning (Patient not taking: Reported on 05/13/2024), Disp: 3 tablet, Rfl: 0   predniSONE  (DELTASONE ) 20 MG tablet, Take 1  tablet (20 mg total) by mouth daily with breakfast. (Patient not taking: Reported on 05/13/2024), Disp: 5 tablet, Rfl: 0   Objective:   Vitals:   05/13/24 1028  BP: 129/86  Pulse: 69  SpO2: 98%    Physical Exam Vitals reviewed.  Constitutional:      Appearance: Normal appearance.  HENT:     Head: Atraumatic.  Cardiovascular:     Rate and Rhythm: Normal rate.     Pulses: Normal pulses.  Pulmonary:     Effort: Pulmonary effort is normal.  Abdominal:     Palpations: Abdomen is soft.  Musculoskeletal:        General: No swelling or deformity.  Skin:    General: Skin is warm.     Capillary Refill: Capillary refill takes less than 2 seconds.  Neurological:     Mental Status: She is alert and oriented to person, place, and time.  Psychiatric:        Mood and Affect: Mood normal.        Behavior: Behavior normal.        Thought Content: Thought content normal.        Judgment: Judgment normal.     Assessment & Plan:  Obstructive sleep apnea syndrome  Large breasts  Neck pain  Symptomatic mammary hypertrophy  The procedure the patient selected and that was best for the patient was discussed. The risk were discussed and include but not limited to the following:  Breast asymmetry, fluid accumulation, firmness of the breast, inability to breast feed, loss of nipple or areola, skin loss, change in skin and nipple sensation, fat necrosis of the breast tissue, bleeding, infection and healing delay.  There are risks of anesthesia and injury to nerves or blood vessels.  Allergic reaction to tape, suture and skin glue are possible.  There will be swelling.  Any of these can lead to the need for revisional surgery which is not included in this surgery.  A breast reduction has potential to interfere with diagnostic procedures in the future.  This procedure is best done when the breast is fully developed.  Changes in the breast will continue to occur over time: pregnancy, weight gain or  weigh loss. No guarantees are given for a certain bra or breast size.    Total time: 40 minutes. This includes time spent with the patient during the visit as well as time spent before and after the visit reviewing the chart, documenting the encounter, ordering pertinent studies and literature for the patient.   Physical therapy: Not required Mammogram: Not indicated  I think the patient is a really good candidate for bilateral breast reduction.  Her breasts are really large compared to her overall body size.  I think she would benefit greatly.  Plan for bilateral breast reduction with liposuction.  She may need amputation  technique and she is aware of that.  Pictures were obtained of the patient and placed in the chart with the patient's or guardian's permission.   Estefana RAMAN Omkar Stratmann, DO

## 2024-05-19 ENCOUNTER — Encounter: Payer: Self-pay | Admitting: Cardiology

## 2024-05-21 ENCOUNTER — Other Ambulatory Visit: Payer: Self-pay | Admitting: Cardiology

## 2024-05-21 DIAGNOSIS — R0683 Snoring: Secondary | ICD-10-CM

## 2024-05-21 DIAGNOSIS — G4733 Obstructive sleep apnea (adult) (pediatric): Secondary | ICD-10-CM

## 2024-06-12 ENCOUNTER — Encounter (HOSPITAL_BASED_OUTPATIENT_CLINIC_OR_DEPARTMENT_OTHER): Payer: Self-pay | Admitting: Plastic Surgery

## 2024-06-13 ENCOUNTER — Ambulatory Visit (INDEPENDENT_AMBULATORY_CARE_PROVIDER_SITE_OTHER): Admitting: Student

## 2024-06-13 ENCOUNTER — Telehealth: Payer: Self-pay

## 2024-06-13 DIAGNOSIS — N62 Hypertrophy of breast: Secondary | ICD-10-CM

## 2024-06-13 NOTE — Telephone Encounter (Signed)
 Faxed the Request for Surgical Clearance form to patient's PCP, Jeoffrey Pollen, NP, and her cardiologist, Dr. Fernand, with confirmed receipt.  Both providers work out of the same office.  Also faxed her GI: Dr. Darra with confirmed receipt.

## 2024-06-13 NOTE — Progress Notes (Signed)
 Patient ID: Sandra Wilkerson, female    DOB: [birth year], 28 y.o.   MRN: [id number]  Preoperative Appointment     ICD-10-CM   1. Symptomatic mammary hypertrophy  N62        History of Present Illness: Sandra Wilkerson is a 28 y.o.  female  with a history of macromastia.  She presents for preoperative evaluation for upcoming procedure, Bilateral Breast Reduction with liposuction, scheduled for 06/19/2024 with Dr.  Lowery  Summary of Previous Visit: Patient was seen for consult by Dr. Lowery on 05/13/2024.  At this visit, patient reported upper back and neck pain due to her enlarged breast.  On exam, her STN on the right was 40 cm and her STN on the left was 40 cm.  Her BMI was 36.6 kg/m.  Her preoperative bra size was not M.  The amount of excess breast tissue to be removed at the time of surgery was 550 g bilaterally.  The patient was found to be a good candidate for bilateral breast reduction.  Estimated excess breast tissue to be removed at time of surgery: 550 grams  Patient reports that she has never needed a mammogram before.  She does report her grandmother has had breast cancer.  She denies any current cardiac disease and denies taking any blood thinners currently.  She does state that she was having chest pain back in March and had a cardiac workup which she states was negative.  She states that her PCP and the patient came to the conclusion that the chest pain was due to panic attacks.  Patient does not report any new issues since then.  Patient states that she currently has an IUD in place.  She denies any history of miscarriages.  She denies any personal family history of blood clots or clotting diseases.  She denies any recent surgeries, traumas or infections.  She denies any history of stroke or heart attack.  She denies any history of Crohn's disease, ulcerative colitis.  She denies any history of COPD.  She denies any history of cancer.  She denies any varicosities to  her lower extremities.  She denies any recent fevers or chills.  She states that she was recently diagnosed with sleep apnea, but she is not wearing a CPAP at this time.   I discussed with the patient that in the chart, it appears that she still has pending cardiac workup left and that there are further notes about resolution of her cardiac symptoms.  I discussed with her that the preoperative team over at the surgery center will need cardiac clearance prior to proceeding with surgery.  We will also send clearance to her PCP.  Patient expressed understanding.  Patient is also noted to be on Skyrizi for her Crohn's disease.  She states that this is managed by her gastroenterologist.  I discussed with the patient that medication such as Nelma can inhibit her wound healing and typically we have these medications held 1 month before and 1 month after surgery.  We will go ahead and send a clearance to her gastroenterologist to have this held.  Patient expressed understanding  Given that we are going to be sending several clearances, recommended delaying surgery until these clearances have been received.  Patient expressed understanding.  She and I will do a telephone visit in 2 weeks to follow-up on these clearances and discuss next steps.     Electronically signed by: Estefana FORBES Peck, PA-C 06/13/2024 5:46 PM

## 2024-06-17 ENCOUNTER — Telehealth (INDEPENDENT_AMBULATORY_CARE_PROVIDER_SITE_OTHER): Payer: Self-pay | Admitting: Sleep Medicine

## 2024-06-17 NOTE — Telephone Encounter (Signed)
 Sleep study mild OSA, recommend proceeding with APAP therapy set to 4-16 cm H2O, EPR 3 with the Airtouch N30i nasal mask.

## 2024-06-18 ENCOUNTER — Telehealth: Payer: Self-pay | Admitting: Cardiology

## 2024-06-18 NOTE — Telephone Encounter (Signed)
 Patient called to check the status of her surgery clearance paperwork. Sandra Wilkerson said she doesn't have it. Can you check with SK and see if he has it?

## 2024-06-19 ENCOUNTER — Encounter (HOSPITAL_BASED_OUTPATIENT_CLINIC_OR_DEPARTMENT_OTHER): Payer: Self-pay

## 2024-06-19 ENCOUNTER — Ambulatory Visit (HOSPITAL_BASED_OUTPATIENT_CLINIC_OR_DEPARTMENT_OTHER): Admit: 2024-06-19 | Admitting: Plastic Surgery

## 2024-06-19 SURGERY — BREAST REDUCTION WITH LIPOSUCTION
Anesthesia: Choice | Site: Breast | Laterality: Bilateral

## 2024-06-24 ENCOUNTER — Telehealth: Payer: Self-pay | Admitting: Plastic Surgery

## 2024-06-24 ENCOUNTER — Telehealth: Payer: Self-pay

## 2024-06-24 NOTE — Telephone Encounter (Signed)
 Got the paperwork and spoke with the folks at the surgeons office, getting it signed and will fax back

## 2024-06-24 NOTE — Telephone Encounter (Signed)
 Pt called back regarding this, informed to call surgeon to refax clearance ppr work

## 2024-06-24 NOTE — Telephone Encounter (Signed)
 Faxed the Request for Surgical Clearance form to patient's PCP, Jeoffrey Pollen, NP, and her cardiologist, Dr. Fernand, with confirmed receipt.  Both providers work out of the same office.  Also faxed her GI: Dr. Darra with confirmed receipt.

## 2024-06-24 NOTE — Telephone Encounter (Signed)
 Patient called wondering about a surgical clearance form and moving forward with surgery

## 2024-06-24 NOTE — Telephone Encounter (Signed)
 Office was contacted for cards/PCP for new fax number. I was given [phone].  Faxed this number with confirmed receipt.

## 2024-06-27 ENCOUNTER — Encounter: Admitting: Plastic Surgery

## 2024-07-01 NOTE — Progress Notes (Unsigned)
   Referring Provider Carin Gauze, NP [address] [place],  [place] [address]   CC: No chief complaint on file.     Sandra Wilkerson is an 28 y.o. female.  HPI: Patient is a 28 year old female who presents for follow-up today to further discuss moving forward with breast reduction surgery.  Patient was seen for consult by Dr. Lowery on 05/13/2024. At this visit, patient reported upper back and neck pain due to her enlarged breast. On exam, her STN on the right was 40 cm and her STN on the left was 40 cm. Her BMI was 36.6 kg/m. Her preoperative bra size was not M. The amount of excess breast tissue to be removed at the time of surgery was 550 g bilaterally. The patient was found to be a good candidate for bilateral breast reduction.   Patient then had a telephone visit on 06/13/2024.  At this visit, patient reported that she was having chest pain back in March which she attributed to panic attacks.  It was discussed with her that the preoperative team over the surgery center wanted cardiac clearance prior to proceeding with surgery.  A clearance was also sent to her GI provider to see if she could hold her Skyrizi as well as to her primary care provider.  Per chart review, patient saw Dr. Fernand on on 11/16/2023 and it was recommended that patient follow-up in about 4 weeks for a an echo and stress test..  Patient then saw Dr. Fernand again on 12/24/2023 and it was recommended that patient follow-up in 3 weeks for a CCTA after that time.  There does not appear to be any further documentation of follow-up with Dr. Fernand, although it does appear patient then did follow-up with her PCP, Gauze Carin, NP.  Since patient's last visit here in our office, clearance was sent to Dr. Gaspar. Fernand both of which signed off on clearance for patient's breast reduction surgery.  A clearance had also been sent to patient's GI provider, Dr. Darra. Patient was seen by Dr. Darra on 06/17/24. Per Dr. Keller note:  Patient's elective breast reduction surgery was postponed due to the concern that her current use of risankizumab would affect wound healing. Discussed that there are no reported risks of infectious complications on this medication surrounding surgery and we do not recommend altering her dose around her surgery. Furthermore the [facility] study which specifically evaluated the more immunosuppressive anti-TNF medications on the risk of infection for higher risk intraabdominal surgeries found that in IBD patients on anti-TNF therapy there was no increased association with postoperative infectious complications. We stressed that she can proceed with her planned breast reduction surgery at her surgeons convenience. And You do not need to change how you take your skyrizi or delay your breast reduction surgery because you are on skyrizi..   Today,    Review of Systems General: ***  Physical Exam    05/13/2024   10:28 AM 04/21/2024    1:09 PM 01/31/2024    9:14 AM  Vitals with BMI  Height 4' 11 4' 11 4' 11  Weight 181 lbs 180 lbs 3 oz 177 lbs  BMI 36.54 36.38 35.73  Systolic 129 127 863  Diastolic 86 85 92  Pulse 69 91 93    General:  No acute distress,  Alert and oriented, Non-Toxic, Normal speech and affect ***   Assessment/Plan ***  Sandra Wilkerson 07/01/2024, 10:49 AM

## 2024-07-02 ENCOUNTER — Ambulatory Visit (INDEPENDENT_AMBULATORY_CARE_PROVIDER_SITE_OTHER): Admitting: Student

## 2024-07-02 DIAGNOSIS — N62 Hypertrophy of breast: Secondary | ICD-10-CM

## 2024-07-03 ENCOUNTER — Ambulatory Visit (INDEPENDENT_AMBULATORY_CARE_PROVIDER_SITE_OTHER): Admitting: Plastic Surgery

## 2024-07-03 DIAGNOSIS — M542 Cervicalgia: Secondary | ICD-10-CM

## 2024-07-03 DIAGNOSIS — N62 Hypertrophy of breast: Secondary | ICD-10-CM | POA: Diagnosis not present

## 2024-07-03 NOTE — Progress Notes (Signed)
   Subjective:    Patient ID: Sandra Wilkerson, female    DOB: 09-21-1996, 28 y.o.   MRN: [id number]  The patient is a 28 year old female joining me by phone.  She is concerned about her breast size.  She was seen by her rheumatologist and told she did not need to come off her rheumatology medicine prior to surgery.  However I expressed to her that what we have seen in real life is about a month after surgery patients open up and I do not want that to happen to her.  I want to keep her safe.  I do not want to cause any flareup of her Crohn's either.  After long discussion the patient said she does want to move ahead with surgery and is feel comfortable coming off of the medication or at least holding it for the time.  Around the surgery.      Review of Systems  Constitutional: Negative.   HENT: Negative.    Eyes: Negative.   Respiratory: Negative.    Cardiovascular: Negative.   Gastrointestinal: Negative.   Endocrine: Negative.   Genitourinary: Negative.   Musculoskeletal: Negative.        Objective:   Physical Exam      Assessment & Plan:     ICD-10-CM   1. Symptomatic mammary hypertrophy  N62     2. Large breasts  N62     3. Neck pain  M54.2        I connected with  Sandra Wilkerson on 07/03/24 by phone and verified that I am speaking with the correct person using two identifiers.  Far as I know the patient was at home and I was at the office.  We spent 5 minutes in discussion.   I discussed the limitations of evaluation and management by telemedicine. The patient expressed understanding and agreed to proceed.  The patient still has a high chance of skin breakdown and not healing because of her overall condition but her chances will be better then if she stays on the medication throughout the surgery.  Patient wants to move ahead with bilateral breast reduction with liposuction.  She will hold the October dose and then we can do the surgery late November.

## 2024-07-11 ENCOUNTER — Encounter: Admitting: Surgical

## 2024-07-25 ENCOUNTER — Encounter: Admitting: Surgical

## 2024-08-04 ENCOUNTER — Ambulatory Visit (INDEPENDENT_AMBULATORY_CARE_PROVIDER_SITE_OTHER): Admitting: Student

## 2024-08-04 VITALS — BP 129/87 | HR 78 | Ht 59.0 in | Wt 181.0 lb

## 2024-08-04 DIAGNOSIS — N62 Hypertrophy of breast: Secondary | ICD-10-CM

## 2024-08-04 MED ORDER — ONDANSETRON HCL 4 MG PO TABS: 4.0000 mg | ORAL_TABLET | Freq: Three times a day (TID) | ORAL | 0 refills | Status: AC | PRN

## 2024-08-04 MED ORDER — OXYCODONE HCL 5 MG PO TABS: 5.0000 mg | ORAL_TABLET | Freq: Four times a day (QID) | ORAL | 0 refills | Status: DC | PRN

## 2024-08-04 MED ORDER — CEPHALEXIN 500 MG PO CAPS: 500.0000 mg | ORAL_CAPSULE | Freq: Four times a day (QID) | ORAL | 0 refills | Status: AC

## 2024-08-04 NOTE — Progress Notes (Signed)
 Patient ID: Sandra Wilkerson, female    DOB: 17-Mar-1996, 28 y.o.   MRN: [id number]  Chief Complaint  Patient presents with   Pre-op Exam    Bilateral breast reduction      ICD-10-CM   1. Symptomatic mammary hypertrophy  N62        History of Present Illness: Sandra Wilkerson is a 28 y.o.  female  with a history of macromastia.  She presents for preoperative evaluation for upcoming procedure, Bilateral Breast Reduction with liposuction, scheduled for 08/21/24 with Dr.  Lowery  The patient has not had problems with anesthesia.  Patient denies any history of cardiac disease.  She denies taking any blood thinners.  Patient reports she is not a smoker.  Patient reports she currently has an IUD in place.  She denies any history of miscarriages.  She denies any personal family history of blood clots or clotting diseases.  She denies any recent surgeries, traumas or infections.  She denies any history of stroke or heart attack.  Patient does report she has history of Crohn's disease.  She denies history of ulcerative colitis, COPD or asthma.  She denies any history of cancer.  She denies any varicosities to her lower extremities.  She denies any recent fevers, chills or changes in her health.  Patient does report that she has sleep apnea, but she does not wear CPAP at night.  Patient reports she is currently in M cup, she would like to be around a C cup.  Discussed with the patient that cup size cannot be guaranteed.  She expressed understanding.  Of note, patient reports that she has been holding her Skyrizi since August.  Summary of Previous Visit: Patient was seen for consult by Dr. Lowery on 05/13/2024. At this visit, patient reported upper back and neck pain due to her enlarged breast. On exam, her STN on the right was 40 cm and her STN on the left was 40 cm. Her BMI was 36.6 kg/m. Her preoperative bra size was not M. The amount of excess breast tissue to be removed at the time  of surgery was 550 g bilaterally. The patient was found to be a good candidate for bilateral breast reduction.   Patient then had a telephone visit on 06/13/2024.  At this visit, patient reported that she was having chest pain back in March which she attributed to panic attacks.  It was discussed with her that the preoperative team over the surgery center wanted cardiac clearance prior to proceeding with surgery.  A clearance was also sent to her GI provider to see if she could hold her Skyrizi as well as to her primary care provider.   Per chart review, patient saw Dr. Fernand on on 11/16/2023 and it was recommended that patient follow-up in about 4 weeks for a an echo and stress test..  Patient then saw Dr. Fernand again on 12/24/2023 and it was recommended that patient follow-up in 3 weeks for a CCTA after that time.  There does not appear to be any further documentation of follow-up with Dr. Fernand, although it does appear patient then did follow-up with her PCP, Jeoffrey Pollen, NP.   Clearance was sent to Dr. Gaspar. Fernand both of which signed off on clearance for patient's breast reduction surgery.  Patient had visit on 07/02/2024.  At this visit, patient reported that she did talk with Dr. Nelida about clearance for surgery and she reported that per Dr. Nelida, patient could  follow-up with him if she were to have chest pain again.  She also stated that her GI provider recommended she continue the Skyrizi as recommended.  Patient then had a visit with Dr. Lowery on 07/03/2024.  At this visit, wound breakdown on medications such as the Skyrizi could be a possibility.  After long discussion, it was noted that the patient wanted to move forward with surgery and felt comfortable coming off the medication/holding it for the time around surgery.  It was also noted that the patient still has a high chance of skin breakdown and not healing because of her overall condition   Estimated excess breast tissue to be  removed at time of surgery: 550 grams  Job: Does not work at this time  PMH Significant for: Asthma, sleep apnea, Crohn's disease, macromastia   Past Medical History: Allergies: Allergies  Allergen Reactions   Infliximab  Anaphylaxis    Remicade    Other Anaphylaxis    Iron  Supplements   Risankizumab Itching, Other (See Comments) and Cough    Chest pressure Skyrizi    Current Medications:  Current Outpatient Medications:    cephALEXin (KEFLEX) 500 MG capsule, Take 1 capsule (500 mg total) by mouth 4 (four) times daily for 3 days., Disp: 12 capsule, Rfl: 0   colestipol  (COLESTID ) 1 g tablet, Take 2 g by mouth 2 (two) times daily. At least 1 hour separate from other medications, Disp: , Rfl:    EPINEPHrine  (EPIPEN  2-PAK) 0.3 mg/0.3 mL IJ SOAJ injection, Inject 0.3 mg into the muscle as needed for anaphylaxis., Disp: 4 each, Rfl: 1   fluticasone  (FLONASE ) 50 MCG/ACT nasal spray, Place 1 spray into both nostrils daily., Disp: 48 mL, Rfl: 3   ondansetron  (ZOFRAN ) 4 MG tablet, Take 1 tablet (4 mg total) by mouth every 8 (eight) hours as needed for up to 15 doses for nausea or vomiting., Disp: 15 tablet, Rfl: 0   oxyCODONE  (ROXICODONE ) 5 MG immediate release tablet, Take 1 tablet (5 mg total) by mouth every 6 (six) hours as needed for up to 15 doses for severe pain (pain score 7-10)., Disp: 15 tablet, Rfl: 0   Risankizumab-rzaa (SKYRIZI) 360 MG/2.4ML SOCT, Inject 360 mg into the skin., Disp: , Rfl:   Past Medical Problems: Past Medical History:  Diagnosis Date   Anemia    Crohn disease (HCC)    PCOS (polycystic ovarian syndrome)     Past Surgical History: Past Surgical History:  Procedure Laterality Date   APPENDECTOMY     CESAREAN SECTION  07/15/2021   Procedure: CESAREAN SECTION;  Surgeon: Janit Alm Agent, MD;  Location: [facility] ORS;  Service: Obstetrics;;   CESAREAN SECTION N/A 05/07/2023   Procedure: REPEAT CESAREAN DELIVERY;  Surgeon: Janit Alm Agent, MD;  Location: [facility]  ORS;  Service: Obstetrics;  Laterality: N/A;   CHOLECYSTECTOMY     COLECTOMY     COLOSTOMY     COLOSTOMY REVERSAL     ERCP N/A 11/07/2021   Procedure: ENDOSCOPIC RETROGRADE CHOLANGIOPANCREATOGRAPHY (ERCP);  Surgeon: Jinny Carmine, MD;  Location: [facility] ENDOSCOPY;  Service: Endoscopy;  Laterality: N/A;    Social History: Social History   Socioeconomic History   Marital status: Significant Other    Spouse name: Darold   Number of children: 1   Years of education: 16   Highest education level: Not on file  Occupational History   Occupation: childcare  Tobacco Use   Smoking status: Never   Smokeless tobacco: Never  Vaping Use   Vaping status:  Never Used  Substance and Sexual Activity   Alcohol use: No   Drug use: No   Sexual activity: Yes    Partners: Male    Birth control/protection: None  Other Topics Concern   Not on file  Social History Narrative   Not on file   Social Drivers of Health   Financial Resource Strain: Low Risk  (10/05/2022)   Overall Financial Resource Strain (CARDIA)    Difficulty of Paying Living Expenses: Not very hard  Food Insecurity: No Food Insecurity (05/07/2023)   Hunger Vital Sign    Worried About Running Out of Food in the Last Year: Never true    Ran Out of Food in the Last Year: Never true  Transportation Needs: No Transportation Needs (05/07/2023)   PRAPARE - Administrator, Civil Service (Medical): No    Lack of Transportation (Non-Medical): No  Physical Activity: Insufficiently Active (10/05/2022)   Exercise Vital Sign    Days of Exercise per Week: 1 day    Minutes of Exercise per Session: 10 min  Stress: No Stress Concern Present (10/05/2022)   [employer] of Occupational Health - Occupational Stress Questionnaire    Feeling of Stress : Not at all  Social Connections: Moderately Integrated (10/05/2022)   Social Connection and Isolation Panel    Frequency of Communication with Friends and Family: More than three  times a week    Frequency of Social Gatherings with Friends and Family: Three times a week    Attends Religious Services: More than 4 times per year    Active Member of Clubs or Organizations: No    Attends Banker Meetings: Never    Marital Status: Living with partner  Intimate Partner Violence: Not At Risk (06/17/2024)   Received from [facility]   Humiliation, Afraid, Rape, and Kick questionnaire    Within the last year, have you been afraid of your partner or ex-partner?: No    Within the last year, have you been humiliated or emotionally abused in other ways by your partner or ex-partner?: No    Within the last year, have you been kicked, hit, slapped, or otherwise physically hurt by your partner or ex-partner?: No    Within the last year, have you been raped or forced to have any kind of sexual activity by your partner or ex-partner?: No    Family History: Family History  Problem Relation Age of Onset   Healthy Mother    Asthma Father    Healthy Sister    Healthy Sister    Healthy Sister    Healthy Sister    Healthy Sister    Diabetes Maternal Grandmother    Alzheimer's disease Maternal Grandmother    Healthy Maternal Grandfather    Congestive Heart Failure Paternal Grandmother     Review of Systems: Denies any recent fevers, chills or changes in her health  Physical Exam: Vital Signs BP 129/87 (Cuff Size: Normal)   Pulse 78   Ht 4' 11 (1.499 m)   Wt 181 lb (82.1 kg)   SpO2 100%   BMI 36.56 kg/m   Physical Exam  Constitutional:      General: Not in acute distress.    Appearance: Normal appearance. Not ill-appearing.  HENT:     Head: Normocephalic and atraumatic.  Eyes:     Pupils: Pupils are equal, round Neck:     Musculoskeletal: Normal range of motion.  Cardiovascular:     Rate and Rhythm: Normal  rate Pulmonary:     Effort: Pulmonary effort is normal. No respiratory distress.  Musculoskeletal: Normal range of motion.  Skin:     General: Skin is warm and dry.     Findings: No erythema or rash.  Neurological:     Mental Status: Alert and oriented to person, place, and time. Mental status is at baseline.  Psychiatric:        Mood and Affect: Mood normal.        Behavior: Behavior normal.    Assessment/Plan: The patient is scheduled for bilateral breast reduction with Dr. Lowery.  Risks, benefits, and alternatives of procedure discussed, questions answered and consent obtained.    Smoking Status: Non-smoker; Counseling Given?  N/A Last Mammogram: N/A due to age  Caprini Score: 4; Risk Factors include: BMI greater than 25, history of Crohn's disease and length of planned surgery. Recommendation for mechanical prophylaxis. Encourage early ambulation.   Pictures obtained: @consult   Post-op Rx sent to pharmacy: Oxycodone , Zofran , Keflex  Discussed with patient I like her to continue to hold her Skyrizi and hold it for 1 month after surgery.  Patient states she is no longer taking infliximab  or prednisone .  Discussed with the patient to hold her colestipol  the day before and the day of surgery.  Instructed her to hold any sort of multivitamins or supplements at least 1 week prior to surgery.  Patient expressed understanding.  Patient was provided with the breast reduction and General Surgical Risk consent document and Pain Medication Agreement prior to their appointment.  They had adequate time to read through the risk consent documents and Pain Medication Agreement. We also discussed them in person together during this preop appointment. All of their questions were answered to their satisfaction.  Recommended calling if they have any further questions.  Risk consent form and Pain Medication Agreement to be scanned into patient's chart.  The risk that can be encountered with breast reduction were discussed and include the following but not limited to these:  Breast asymmetry, fluid accumulation, firmness of the breast,  inability to breast feed, loss of nipple or areola, skin loss, decrease or no nipple sensation, fat necrosis of the breast tissue, bleeding, infection, healing delay.  There are risks of anesthesia, changes to skin sensation and injury to nerves or blood vessels.  The muscle can be temporarily or permanently injured.  You may have an allergic reaction to tape, suture, glue, blood products which can result in skin discoloration, swelling, pain, skin lesions, poor healing.  Any of these can lead to the need for revisonal surgery or stage procedures.  A reduction has potential to interfere with diagnostic procedures.  Nipple or breast piercing can increase risks of infection.  This procedure is best done when the breast is fully developed.  Changes in the breast will continue to occur over time.  Pregnancy can alter the outcomes of previous breast reduction surgery, weight gain and weigh loss can also effect the long term appearance.   We discussed the possibility of amputation/free nipple graft technique due to the length of her STN.  She is understanding of the possibility that we would need to transition from a pedicle technique to a free nipple graft technique intraoperatively.  We discussed the risks associated with free nipple graft breast reductions, including but not limited to failure of the graft, partial loss of the graft, loss of sensation of bilateral nipple areola, complete loss of the nipple areola graft, inability to breast-feed, postoperative wounds, ongoing  wound care.  We also discussed the risks associated with the pedicle technique.  We discussed that with the pedicle technique she could develop nipple areolar necrosis which would result in loss of the nipple, this would also result in ongoing wound care and possible changes in the shape of her breast.   I discussed with the patient that her history of sleep apnea may put her at higher risk of perioperative complications.  Patient expressed  understanding.    Electronically signed by: Estefana FORBES Peck, PA-C 08/04/2024 4:44 PM

## 2024-08-04 NOTE — H&P (View-Only) (Signed)
 Patient ID: Sandra Wilkerson, female    DOB: 17-Mar-1996, 28 y.o.   MRN: [id number]  Chief Complaint  Patient presents with   Pre-op Exam    Bilateral breast reduction      ICD-10-CM   1. Symptomatic mammary hypertrophy  N62        History of Present Illness: Sandra Wilkerson is a 28 y.o.  female  with a history of macromastia.  She presents for preoperative evaluation for upcoming procedure, Bilateral Breast Reduction with liposuction, scheduled for 08/21/24 with Dr.  Lowery  The patient has not had problems with anesthesia.  Patient denies any history of cardiac disease.  She denies taking any blood thinners.  Patient reports she is not a smoker.  Patient reports she currently has an IUD in place.  She denies any history of miscarriages.  She denies any personal family history of blood clots or clotting diseases.  She denies any recent surgeries, traumas or infections.  She denies any history of stroke or heart attack.  Patient does report she has history of Crohn's disease.  She denies history of ulcerative colitis, COPD or asthma.  She denies any history of cancer.  She denies any varicosities to her lower extremities.  She denies any recent fevers, chills or changes in her health.  Patient does report that she has sleep apnea, but she does not wear CPAP at night.  Patient reports she is currently in M cup, she would like to be around a C cup.  Discussed with the patient that cup size cannot be guaranteed.  She expressed understanding.  Of note, patient reports that she has been holding her Skyrizi since August.  Summary of Previous Visit: Patient was seen for consult by Dr. Lowery on 05/13/2024. At this visit, patient reported upper back and neck pain due to her enlarged breast. On exam, her STN on the right was 40 cm and her STN on the left was 40 cm. Her BMI was 36.6 kg/m. Her preoperative bra size was not M. The amount of excess breast tissue to be removed at the time  of surgery was 550 g bilaterally. The patient was found to be a good candidate for bilateral breast reduction.   Patient then had a telephone visit on 06/13/2024.  At this visit, patient reported that she was having chest pain back in March which she attributed to panic attacks.  It was discussed with her that the preoperative team over the surgery center wanted cardiac clearance prior to proceeding with surgery.  A clearance was also sent to her GI provider to see if she could hold her Skyrizi as well as to her primary care provider.   Per chart review, patient saw Dr. Fernand on on 11/16/2023 and it was recommended that patient follow-up in about 4 weeks for a an echo and stress test..  Patient then saw Dr. Fernand again on 12/24/2023 and it was recommended that patient follow-up in 3 weeks for a CCTA after that time.  There does not appear to be any further documentation of follow-up with Dr. Fernand, although it does appear patient then did follow-up with her PCP, Jeoffrey Pollen, NP.   Clearance was sent to Dr. Gaspar. Fernand both of which signed off on clearance for patient's breast reduction surgery.  Patient had visit on 07/02/2024.  At this visit, patient reported that she did talk with Dr. Nelida about clearance for surgery and she reported that per Dr. Nelida, patient could  follow-up with him if she were to have chest pain again.  She also stated that her GI provider recommended she continue the Skyrizi as recommended.  Patient then had a visit with Dr. Lowery on 07/03/2024.  At this visit, wound breakdown on medications such as the Skyrizi could be a possibility.  After long discussion, it was noted that the patient wanted to move forward with surgery and felt comfortable coming off the medication/holding it for the time around surgery.  It was also noted that the patient still has a high chance of skin breakdown and not healing because of her overall condition   Estimated excess breast tissue to be  removed at time of surgery: 550 grams  Job: Does not work at this time  PMH Significant for: Asthma, sleep apnea, Crohn's disease, macromastia   Past Medical History: Allergies: Allergies  Allergen Reactions   Infliximab  Anaphylaxis    Remicade    Other Anaphylaxis    Iron  Supplements   Risankizumab Itching, Other (See Comments) and Cough    Chest pressure Skyrizi    Current Medications:  Current Outpatient Medications:    cephALEXin (KEFLEX) 500 MG capsule, Take 1 capsule (500 mg total) by mouth 4 (four) times daily for 3 days., Disp: 12 capsule, Rfl: 0   colestipol  (COLESTID ) 1 g tablet, Take 2 g by mouth 2 (two) times daily. At least 1 hour separate from other medications, Disp: , Rfl:    EPINEPHrine  (EPIPEN  2-PAK) 0.3 mg/0.3 mL IJ SOAJ injection, Inject 0.3 mg into the muscle as needed for anaphylaxis., Disp: 4 each, Rfl: 1   fluticasone  (FLONASE ) 50 MCG/ACT nasal spray, Place 1 spray into both nostrils daily., Disp: 48 mL, Rfl: 3   ondansetron  (ZOFRAN ) 4 MG tablet, Take 1 tablet (4 mg total) by mouth every 8 (eight) hours as needed for up to 15 doses for nausea or vomiting., Disp: 15 tablet, Rfl: 0   oxyCODONE  (ROXICODONE ) 5 MG immediate release tablet, Take 1 tablet (5 mg total) by mouth every 6 (six) hours as needed for up to 15 doses for severe pain (pain score 7-10)., Disp: 15 tablet, Rfl: 0   Risankizumab-rzaa (SKYRIZI) 360 MG/2.4ML SOCT, Inject 360 mg into the skin., Disp: , Rfl:   Past Medical Problems: Past Medical History:  Diagnosis Date   Anemia    Crohn disease (HCC)    PCOS (polycystic ovarian syndrome)     Past Surgical History: Past Surgical History:  Procedure Laterality Date   APPENDECTOMY     CESAREAN SECTION  07/15/2021   Procedure: CESAREAN SECTION;  Surgeon: Janit Alm Agent, MD;  Location: [facility] ORS;  Service: Obstetrics;;   CESAREAN SECTION N/A 05/07/2023   Procedure: REPEAT CESAREAN DELIVERY;  Surgeon: Janit Alm Agent, MD;  Location: [facility]  ORS;  Service: Obstetrics;  Laterality: N/A;   CHOLECYSTECTOMY     COLECTOMY     COLOSTOMY     COLOSTOMY REVERSAL     ERCP N/A 11/07/2021   Procedure: ENDOSCOPIC RETROGRADE CHOLANGIOPANCREATOGRAPHY (ERCP);  Surgeon: Jinny Carmine, MD;  Location: [facility] ENDOSCOPY;  Service: Endoscopy;  Laterality: N/A;    Social History: Social History   Socioeconomic History   Marital status: Significant Other    Spouse name: Darold   Number of children: 1   Years of education: 16   Highest education level: Not on file  Occupational History   Occupation: childcare  Tobacco Use   Smoking status: Never   Smokeless tobacco: Never  Vaping Use   Vaping status:  Never Used  Substance and Sexual Activity   Alcohol use: No   Drug use: No   Sexual activity: Yes    Partners: Male    Birth control/protection: None  Other Topics Concern   Not on file  Social History Narrative   Not on file   Social Drivers of Health   Financial Resource Strain: Low Risk  (10/05/2022)   Overall Financial Resource Strain (CARDIA)    Difficulty of Paying Living Expenses: Not very hard  Food Insecurity: No Food Insecurity (05/07/2023)   Hunger Vital Sign    Worried About Running Out of Food in the Last Year: Never true    Ran Out of Food in the Last Year: Never true  Transportation Needs: No Transportation Needs (05/07/2023)   PRAPARE - Administrator, Civil Service (Medical): No    Lack of Transportation (Non-Medical): No  Physical Activity: Insufficiently Active (10/05/2022)   Exercise Vital Sign    Days of Exercise per Week: 1 day    Minutes of Exercise per Session: 10 min  Stress: No Stress Concern Present (10/05/2022)   [employer] of Occupational Health - Occupational Stress Questionnaire    Feeling of Stress : Not at all  Social Connections: Moderately Integrated (10/05/2022)   Social Connection and Isolation Panel    Frequency of Communication with Friends and Family: More than three  times a week    Frequency of Social Gatherings with Friends and Family: Three times a week    Attends Religious Services: More than 4 times per year    Active Member of Clubs or Organizations: No    Attends Banker Meetings: Never    Marital Status: Living with partner  Intimate Partner Violence: Not At Risk (06/17/2024)   Received from [facility]   Humiliation, Afraid, Rape, and Kick questionnaire    Within the last year, have you been afraid of your partner or ex-partner?: No    Within the last year, have you been humiliated or emotionally abused in other ways by your partner or ex-partner?: No    Within the last year, have you been kicked, hit, slapped, or otherwise physically hurt by your partner or ex-partner?: No    Within the last year, have you been raped or forced to have any kind of sexual activity by your partner or ex-partner?: No    Family History: Family History  Problem Relation Age of Onset   Healthy Mother    Asthma Father    Healthy Sister    Healthy Sister    Healthy Sister    Healthy Sister    Healthy Sister    Diabetes Maternal Grandmother    Alzheimer's disease Maternal Grandmother    Healthy Maternal Grandfather    Congestive Heart Failure Paternal Grandmother     Review of Systems: Denies any recent fevers, chills or changes in her health  Physical Exam: Vital Signs BP 129/87 (Cuff Size: Normal)   Pulse 78   Ht 4' 11 (1.499 m)   Wt 181 lb (82.1 kg)   SpO2 100%   BMI 36.56 kg/m   Physical Exam  Constitutional:      General: Not in acute distress.    Appearance: Normal appearance. Not ill-appearing.  HENT:     Head: Normocephalic and atraumatic.  Eyes:     Pupils: Pupils are equal, round Neck:     Musculoskeletal: Normal range of motion.  Cardiovascular:     Rate and Rhythm: Normal  rate Pulmonary:     Effort: Pulmonary effort is normal. No respiratory distress.  Musculoskeletal: Normal range of motion.  Skin:     General: Skin is warm and dry.     Findings: No erythema or rash.  Neurological:     Mental Status: Alert and oriented to person, place, and time. Mental status is at baseline.  Psychiatric:        Mood and Affect: Mood normal.        Behavior: Behavior normal.    Assessment/Plan: The patient is scheduled for bilateral breast reduction with Dr. Lowery.  Risks, benefits, and alternatives of procedure discussed, questions answered and consent obtained.    Smoking Status: Non-smoker; Counseling Given?  N/A Last Mammogram: N/A due to age  Caprini Score: 4; Risk Factors include: BMI greater than 25, history of Crohn's disease and length of planned surgery. Recommendation for mechanical prophylaxis. Encourage early ambulation.   Pictures obtained: @consult   Post-op Rx sent to pharmacy: Oxycodone , Zofran , Keflex  Discussed with patient I like her to continue to hold her Skyrizi and hold it for 1 month after surgery.  Patient states she is no longer taking infliximab  or prednisone .  Discussed with the patient to hold her colestipol  the day before and the day of surgery.  Instructed her to hold any sort of multivitamins or supplements at least 1 week prior to surgery.  Patient expressed understanding.  Patient was provided with the breast reduction and General Surgical Risk consent document and Pain Medication Agreement prior to their appointment.  They had adequate time to read through the risk consent documents and Pain Medication Agreement. We also discussed them in person together during this preop appointment. All of their questions were answered to their satisfaction.  Recommended calling if they have any further questions.  Risk consent form and Pain Medication Agreement to be scanned into patient's chart.  The risk that can be encountered with breast reduction were discussed and include the following but not limited to these:  Breast asymmetry, fluid accumulation, firmness of the breast,  inability to breast feed, loss of nipple or areola, skin loss, decrease or no nipple sensation, fat necrosis of the breast tissue, bleeding, infection, healing delay.  There are risks of anesthesia, changes to skin sensation and injury to nerves or blood vessels.  The muscle can be temporarily or permanently injured.  You may have an allergic reaction to tape, suture, glue, blood products which can result in skin discoloration, swelling, pain, skin lesions, poor healing.  Any of these can lead to the need for revisonal surgery or stage procedures.  A reduction has potential to interfere with diagnostic procedures.  Nipple or breast piercing can increase risks of infection.  This procedure is best done when the breast is fully developed.  Changes in the breast will continue to occur over time.  Pregnancy can alter the outcomes of previous breast reduction surgery, weight gain and weigh loss can also effect the long term appearance.   We discussed the possibility of amputation/free nipple graft technique due to the length of her STN.  She is understanding of the possibility that we would need to transition from a pedicle technique to a free nipple graft technique intraoperatively.  We discussed the risks associated with free nipple graft breast reductions, including but not limited to failure of the graft, partial loss of the graft, loss of sensation of bilateral nipple areola, complete loss of the nipple areola graft, inability to breast-feed, postoperative wounds, ongoing  wound care.  We also discussed the risks associated with the pedicle technique.  We discussed that with the pedicle technique she could develop nipple areolar necrosis which would result in loss of the nipple, this would also result in ongoing wound care and possible changes in the shape of her breast.   I discussed with the patient that her history of sleep apnea may put her at higher risk of perioperative complications.  Patient expressed  understanding.    Electronically signed by: Estefana FORBES Peck, PA-C 08/04/2024 4:44 PM

## 2024-08-05 MED ORDER — OXYCODONE HCL 5 MG PO TABS: 5.0000 mg | ORAL_TABLET | Freq: Four times a day (QID) | ORAL | 0 refills | Status: DC | PRN

## 2024-08-05 NOTE — Addendum Note (Signed)
 Addended by: ANDRIS STAGGER on: 08/05/2024 02:07 PM   Modules accepted: Orders

## 2024-08-13 ENCOUNTER — Encounter (HOSPITAL_BASED_OUTPATIENT_CLINIC_OR_DEPARTMENT_OTHER): Payer: Self-pay | Admitting: Plastic Surgery

## 2024-08-13 ENCOUNTER — Other Ambulatory Visit: Payer: Self-pay

## 2024-08-21 ENCOUNTER — Encounter (HOSPITAL_BASED_OUTPATIENT_CLINIC_OR_DEPARTMENT_OTHER)
Admission: RE | Disposition: A | Discharge status: Home / Self Care | Payer: Self-pay | Source: Home / Self Care | Attending: Plastic Surgery

## 2024-08-21 ENCOUNTER — Encounter (HOSPITAL_BASED_OUTPATIENT_CLINIC_OR_DEPARTMENT_OTHER): Payer: Self-pay | Admitting: Plastic Surgery

## 2024-08-21 ENCOUNTER — Other Ambulatory Visit: Payer: Self-pay

## 2024-08-21 ENCOUNTER — Ambulatory Visit (HOSPITAL_BASED_OUTPATIENT_CLINIC_OR_DEPARTMENT_OTHER)
Admission: RE | Admit: 2024-08-21 | Discharge: 2024-08-21 | Disposition: A | Discharge status: Home / Self Care | Attending: Plastic Surgery | Admitting: Plastic Surgery

## 2024-08-21 ENCOUNTER — Ambulatory Visit (HOSPITAL_BASED_OUTPATIENT_CLINIC_OR_DEPARTMENT_OTHER): Admitting: Anesthesiology

## 2024-08-21 DIAGNOSIS — N62 Hypertrophy of breast: Secondary | ICD-10-CM

## 2024-08-21 DIAGNOSIS — Z87891 Personal history of nicotine dependence: Secondary | ICD-10-CM | POA: Insufficient documentation

## 2024-08-21 DIAGNOSIS — K219 Gastro-esophageal reflux disease without esophagitis: Secondary | ICD-10-CM | POA: Diagnosis not present

## 2024-08-21 DIAGNOSIS — G4730 Sleep apnea, unspecified: Secondary | ICD-10-CM | POA: Diagnosis not present

## 2024-08-21 DIAGNOSIS — M549 Dorsalgia, unspecified: Secondary | ICD-10-CM | POA: Diagnosis not present

## 2024-08-21 DIAGNOSIS — M542 Cervicalgia: Secondary | ICD-10-CM | POA: Diagnosis not present

## 2024-08-21 DIAGNOSIS — Z01818 Encounter for other preprocedural examination: Secondary | ICD-10-CM

## 2024-08-21 HISTORY — PX: BREAST REDUCTION SURGERY: SHX8

## 2024-08-21 LAB — POCT PREGNANCY, URINE: Preg Test, Ur: NEGATIVE

## 2024-08-21 SURGERY — BREAST REDUCTION WITH LIPOSUCTION
Anesthesia: General | Site: Breast | Laterality: Bilateral

## 2024-08-21 MED ORDER — CEFAZOLIN SODIUM-DEXTROSE 2-4 GM/100ML-% IV SOLN
INTRAVENOUS | Status: AC
  Filled 2024-08-21: qty 100

## 2024-08-21 MED ORDER — ACETAMINOPHEN 500 MG PO TABS
ORAL_TABLET | ORAL | Status: AC
  Filled 2024-08-21: qty 2

## 2024-08-21 MED ORDER — ROCURONIUM BROMIDE 10 MG/ML (PF) SYRINGE
PREFILLED_SYRINGE | INTRAVENOUS | Status: AC
  Filled 2024-08-21: qty 10

## 2024-08-21 MED ORDER — LIDOCAINE HCL 1 % IJ SOLN
INTRAVENOUS | Status: DC | PRN
  Administered 2024-08-21: 250 mL

## 2024-08-21 MED ORDER — ROCURONIUM BROMIDE 100 MG/10ML IV SOLN
INTRAVENOUS | Status: DC | PRN
  Administered 2024-08-21: 20 mg via INTRAVENOUS
  Administered 2024-08-21: 50 mg via INTRAVENOUS
  Administered 2024-08-21: 10 mg via INTRAVENOUS

## 2024-08-21 MED ORDER — ONDANSETRON HCL 4 MG/2ML IJ SOLN
INTRAMUSCULAR | Status: DC | PRN
  Administered 2024-08-21: 4 mg via INTRAVENOUS

## 2024-08-21 MED ORDER — HYDROMORPHONE HCL 1 MG/ML IJ SOLN: 0.2500 mg | INTRAMUSCULAR | Status: DC | PRN

## 2024-08-21 MED ORDER — FENTANYL CITRATE (PF) 100 MCG/2ML IJ SOLN
INTRAMUSCULAR | Status: DC | PRN
  Administered 2024-08-21: 50 ug via INTRAVENOUS
  Administered 2024-08-21: 25 ug via INTRAVENOUS
  Administered 2024-08-21: 50 ug via INTRAVENOUS
  Administered 2024-08-21: 25 ug via INTRAVENOUS

## 2024-08-21 MED ORDER — MIDAZOLAM HCL 2 MG/2ML IJ SOLN
INTRAMUSCULAR | Status: AC
  Filled 2024-08-21: qty 2

## 2024-08-21 MED ORDER — ACETAMINOPHEN 500 MG PO TABS
1000.0000 mg | ORAL_TABLET | Freq: Once | ORAL | Status: AC
  Administered 2024-08-21: 1000 mg via ORAL

## 2024-08-21 MED ORDER — HYDROMORPHONE HCL 1 MG/ML IJ SOLN
INTRAMUSCULAR | Status: AC
  Filled 2024-08-21: qty 0.5

## 2024-08-21 MED ORDER — ARTIFICIAL TEARS OPHTHALMIC OINT
TOPICAL_OINTMENT | OPHTHALMIC | Status: AC
  Filled 2024-08-21: qty 14

## 2024-08-21 MED ORDER — FENTANYL CITRATE (PF) 100 MCG/2ML IJ SOLN
25.0000 ug | INTRAMUSCULAR | Status: DC | PRN
  Administered 2024-08-21: 50 ug via INTRAVENOUS

## 2024-08-21 MED ORDER — HYDROMORPHONE HCL 1 MG/ML IJ SOLN
INTRAMUSCULAR | Status: DC | PRN
  Administered 2024-08-21: .5 mg via INTRAVENOUS

## 2024-08-21 MED ORDER — ACETAMINOPHEN 500 MG PO TABS: 1000.0000 mg | ORAL_TABLET | Freq: Once | ORAL | Status: AC

## 2024-08-21 MED ORDER — DEXMEDETOMIDINE HCL IN NACL 80 MCG/20ML IV SOLN
INTRAVENOUS | Status: DC | PRN
  Administered 2024-08-21: 8 ug via INTRAVENOUS

## 2024-08-21 MED ORDER — OXYCODONE HCL 5 MG PO TABS
5.0000 mg | ORAL_TABLET | ORAL | Status: DC | PRN
  Administered 2024-08-21: 5 mg via ORAL

## 2024-08-21 MED ORDER — SUGAMMADEX SODIUM 200 MG/2ML IV SOLN
INTRAVENOUS | Status: DC | PRN
  Administered 2024-08-21: 200 mg via INTRAVENOUS

## 2024-08-21 MED ORDER — FENTANYL CITRATE (PF) 100 MCG/2ML IJ SOLN
INTRAMUSCULAR | Status: AC
  Filled 2024-08-21: qty 2

## 2024-08-21 MED ORDER — SODIUM CHLORIDE 0.9% FLUSH: 3.0000 mL | INTRAVENOUS | Status: DC | PRN

## 2024-08-21 MED ORDER — PROPOFOL 500 MG/50ML IV EMUL
INTRAVENOUS | Status: AC
  Filled 2024-08-21: qty 50

## 2024-08-21 MED ORDER — VASHE WOUND IRRIGATION OPTIME
TOPICAL | Status: DC | PRN
  Administered 2024-08-21: 34 [oz_av]

## 2024-08-21 MED ORDER — LIDOCAINE-EPINEPHRINE 1 %-1:100000 IJ SOLN
INTRAMUSCULAR | Status: DC | PRN
  Administered 2024-08-21: 50 mL

## 2024-08-21 MED ORDER — DROPERIDOL 2.5 MG/ML IJ SOLN: 0.6250 mg | Freq: Once | INTRAMUSCULAR | Status: DC | PRN

## 2024-08-21 MED ORDER — PROPOFOL 500 MG/50ML IV EMUL
INTRAVENOUS | Status: DC | PRN
  Administered 2024-08-21: 200 ug/kg/min via INTRAVENOUS

## 2024-08-21 MED ORDER — LIDOCAINE HCL (CARDIAC) PF 100 MG/5ML IV SOSY
PREFILLED_SYRINGE | INTRAVENOUS | Status: DC | PRN
  Administered 2024-08-21: 80 mg via INTRAVENOUS

## 2024-08-21 MED ORDER — DEXAMETHASONE SODIUM PHOSPHATE 4 MG/ML IJ SOLN
INTRAMUSCULAR | Status: DC | PRN
  Administered 2024-08-21: 8 mg via INTRAVENOUS

## 2024-08-21 MED ORDER — SCOPOLAMINE 1 MG/3DAYS TD PT72
MEDICATED_PATCH | TRANSDERMAL | Status: AC
  Filled 2024-08-21: qty 1

## 2024-08-21 MED ORDER — EPHEDRINE 5 MG/ML INJ
INTRAVENOUS | Status: AC
Start: 2024-08-21 — End: 2024-08-21
  Filled 2024-08-21: qty 10

## 2024-08-21 MED ORDER — ARTIFICIAL TEARS OPHTHALMIC OINT
TOPICAL_OINTMENT | OPHTHALMIC | Status: AC
  Filled 2024-08-21: qty 3.5

## 2024-08-21 MED ORDER — SODIUM CHLORIDE 0.9% FLUSH: 3.0000 mL | Freq: Two times a day (BID) | INTRAVENOUS | Status: DC

## 2024-08-21 MED ORDER — PROPOFOL 10 MG/ML IV BOLUS
INTRAVENOUS | Status: DC | PRN
  Administered 2024-08-21: 160 mg via INTRAVENOUS
  Administered 2024-08-21: 50 mg via INTRAVENOUS
  Administered 2024-08-21: 40 mg via INTRAVENOUS

## 2024-08-21 MED ORDER — SCOPOLAMINE 1 MG/3DAYS TD PT72
1.0000 | MEDICATED_PATCH | TRANSDERMAL | Status: DC
  Administered 2024-08-21: 1 mg via TRANSDERMAL

## 2024-08-21 MED ORDER — MIDAZOLAM HCL (PF) 2 MG/2ML IJ SOLN
INTRAMUSCULAR | Status: DC | PRN
  Administered 2024-08-21: 2 mg via INTRAVENOUS

## 2024-08-21 MED ORDER — LIDOCAINE 2% (20 MG/ML) 5 ML SYRINGE
INTRAMUSCULAR | Status: AC
  Filled 2024-08-21: qty 15

## 2024-08-21 MED ORDER — PHENYLEPHRINE 80 MCG/ML (10ML) SYRINGE FOR IV PUSH (FOR BLOOD PRESSURE SUPPORT)
PREFILLED_SYRINGE | INTRAVENOUS | Status: AC
  Filled 2024-08-21: qty 20

## 2024-08-21 MED ORDER — LACTATED RINGERS IV SOLN: INTRAVENOUS | Status: DC

## 2024-08-21 MED ORDER — OXYCODONE HCL 5 MG PO TABS
ORAL_TABLET | ORAL | Status: AC
  Filled 2024-08-21: qty 1

## 2024-08-21 MED ORDER — PROPOFOL 10 MG/ML IV BOLUS
INTRAVENOUS | Status: AC
  Filled 2024-08-21: qty 20

## 2024-08-21 MED ORDER — BUPIVACAINE LIPOSOME 1.3 % IJ SUSP
INTRAMUSCULAR | Status: DC | PRN
  Administered 2024-08-21: 40 mL

## 2024-08-21 MED ORDER — SODIUM CHLORIDE 0.9 % IV SOLN: 250.0000 mL | INTRAVENOUS | Status: DC | PRN

## 2024-08-21 MED ORDER — CEFAZOLIN SODIUM-DEXTROSE 2-3 GM-%(50ML) IV SOLR
INTRAVENOUS | Status: DC | PRN
  Administered 2024-08-21: 2 g via INTRAVENOUS

## 2024-08-21 MED ORDER — ONDANSETRON HCL 4 MG/2ML IJ SOLN
INTRAMUSCULAR | Status: AC
  Filled 2024-08-21: qty 4

## 2024-08-21 MED ORDER — ONDANSETRON HCL 4 MG/2ML IJ SOLN
INTRAMUSCULAR | Status: AC
  Filled 2024-08-21: qty 2

## 2024-08-21 MED ORDER — ACETAMINOPHEN 325 MG PO TABS: 650.0000 mg | ORAL_TABLET | ORAL | Status: DC | PRN

## 2024-08-21 MED ORDER — ACETAMINOPHEN 325 MG RE SUPP: 650.0000 mg | RECTAL | Status: DC | PRN

## 2024-08-21 SURGICAL SUPPLY — 66 items
BINDER BREAST LRG (GAUZE/BANDAGES/DRESSINGS) IMPLANT
BINDER BREAST MEDIUM (GAUZE/BANDAGES/DRESSINGS) IMPLANT
BINDER BREAST XLRG (GAUZE/BANDAGES/DRESSINGS) IMPLANT
BINDER BREAST XXLRG (GAUZE/BANDAGES/DRESSINGS) IMPLANT
BIOPATCH RED 1 DISK 7.0 (GAUZE/BANDAGES/DRESSINGS) IMPLANT
BLADE HEX COATED 2.75 (ELECTRODE) IMPLANT
BLADE KNIFE PERSONA 10 (BLADE) ×2 IMPLANT
BLADE SURG 15 STRL LF DISP TIS (BLADE) ×1 IMPLANT
CANISTER SUCT 1200ML W/VALVE (MISCELLANEOUS) ×1 IMPLANT
CLEANSER WND VASHE 34 (WOUND CARE) ×1 IMPLANT
COTTONBALL LRG STERILE PKG (GAUZE/BANDAGES/DRESSINGS) IMPLANT
COVER BACK TABLE 60X90IN (DRAPES) ×1 IMPLANT
COVER MAYO STAND STRL (DRAPES) ×1 IMPLANT
DERMABOND ADVANCED .7 DNX12 (GAUZE/BANDAGES/DRESSINGS) ×2 IMPLANT
DRAIN CHANNEL 15F RND FF W/TCR (WOUND CARE) IMPLANT
DRAIN CHANNEL 19F RND (DRAIN) IMPLANT
DRAPE LAPAROSCOPIC ABDOMINAL (DRAPES) ×1 IMPLANT
DRAPE UTILITY XL STRL (DRAPES) ×1 IMPLANT
DRSG MEPILEX POST OP 4X8 (GAUZE/BANDAGES/DRESSINGS) ×2 IMPLANT
DRSG TEGADERM 4X4.75 (GAUZE/BANDAGES/DRESSINGS) IMPLANT
DRSG TELFA 3X8 NADH STRL (GAUZE/BANDAGES/DRESSINGS) IMPLANT
ELECTRODE BLDE 4.0 EZ CLN MEGD (MISCELLANEOUS) ×1 IMPLANT
ELECTRODE REM PT RTRN 9FT ADLT (ELECTROSURGICAL) ×1 IMPLANT
EVACUATOR SILICONE 100CC (DRAIN) IMPLANT
GAUZE PAD ABD 8X10 STRL (GAUZE/BANDAGES/DRESSINGS) ×2 IMPLANT
GAUZE XEROFORM 5X9 LF (GAUZE/BANDAGES/DRESSINGS) IMPLANT
GLOVE BIO SURGEON STRL SZ 6.5 (GLOVE) ×2 IMPLANT
GLOVE BIO SURGEON STRL SZ7.5 (GLOVE) IMPLANT
GLOVE BIOGEL PI IND STRL 7.0 (GLOVE) IMPLANT
GLOVE BIOGEL PI IND STRL 8 (GLOVE) IMPLANT
GOWN STRL REUS W/ TWL LRG LVL3 (GOWN DISPOSABLE) ×2 IMPLANT
GOWN STRL REUS W/ TWL XL LVL3 (GOWN DISPOSABLE) IMPLANT
LINER CANISTER 1000CC FLEX (MISCELLANEOUS) ×1 IMPLANT
NDL FILTER BLUNT 18X1 1/2 (NEEDLE) IMPLANT
NDL HYPO 25X1 1.5 SAFETY (NEEDLE) ×2 IMPLANT
NEEDLE FILTER BLUNT 18X1 1/2 (NEEDLE) ×1 IMPLANT
NEEDLE HYPO 25X1 1.5 SAFETY (NEEDLE) ×2 IMPLANT
PACK BASIN DAY SURGERY FS (CUSTOM PROCEDURE TRAY) ×1 IMPLANT
PAD ALCOHOL SWAB (MISCELLANEOUS) IMPLANT
PAD FOAM SILICONE BACKED (GAUZE/BANDAGES/DRESSINGS) IMPLANT
PENCIL SMOKE EVACUATOR (MISCELLANEOUS) ×1 IMPLANT
PIN SAFETY STERILE (MISCELLANEOUS) IMPLANT
POWDER MYRIAD MORCLLS FINE 500 (Miscellaneous) IMPLANT
SLEEVE SCD COMPRESS KNEE MED (STOCKING) ×1 IMPLANT
SOL PREP POV-IOD 4OZ 10% (MISCELLANEOUS) ×1 IMPLANT
SOLN 0.9% NACL POUR BTL 1000ML (IV SOLUTION) IMPLANT
SPIKE FLUID TRANSFER (MISCELLANEOUS) IMPLANT
SPONGE T-LAP 18X18 ~~LOC~~+RFID (SPONGE) ×2 IMPLANT
STRIP SUTURE WOUND CLOSURE 1/2 (MISCELLANEOUS) ×4 IMPLANT
SUT MNCRL AB 4-0 PS2 18 (SUTURE) ×4 IMPLANT
SUT MON AB 3-0 SH27 (SUTURE) ×4 IMPLANT
SUT MON AB 5-0 PS2 18 (SUTURE) IMPLANT
SUT PDS II 3-0 CT2 27 ABS (SUTURE) ×4 IMPLANT
SUT SILK 3 0 PS 1 (SUTURE) IMPLANT
SUT VIC AB 4-0 PS2 27 (SUTURE) IMPLANT
SYR 50ML LL SCALE MARK (SYRINGE) IMPLANT
SYR BULB IRRIG 60ML STRL (SYRINGE) ×1 IMPLANT
SYR CONTROL 10ML LL (SYRINGE) ×2 IMPLANT
TAPE MEASURE VINYL STERILE (MISCELLANEOUS) IMPLANT
TOWEL GREEN STERILE FF (TOWEL DISPOSABLE) ×3 IMPLANT
TRAY DSU PREP LF (CUSTOM PROCEDURE TRAY) ×1 IMPLANT
TUBE CONNECTING 20X1/4 (TUBING) ×1 IMPLANT
TUBING INFILTRATION IT-10001 (TUBING) IMPLANT
TUBING SET GRADUATE ASPIR 12FT (MISCELLANEOUS) IMPLANT
UNDERPAD 30X36 HEAVY ABSORB (UNDERPADS AND DIAPERS) ×2 IMPLANT
YANKAUER SUCT BULB TIP NO VENT (SUCTIONS) ×1 IMPLANT

## 2024-08-21 NOTE — Anesthesia Procedure Notes (Signed)
 Procedure Name: Intubation Date/Time: 08/21/2024 10:11 AM  Performed by: Donnell Berwyn SQUIBB, CRNAPre-anesthesia Checklist: Patient identified, Emergency Drugs available, Suction available, Patient being monitored and Timeout performed Patient Re-evaluated:Patient Re-evaluated prior to induction Oxygen Delivery Method: Circle system utilized Preoxygenation: Pre-oxygenation with 100% oxygen Induction Type: IV induction Ventilation: Mask ventilation without difficulty Laryngoscope Size: Mac and 3 Grade View: Grade I Tube size: 7.0 mm Number of attempts: 1 Airway Equipment and Method: Stylet Placement Confirmation: ETT inserted through vocal cords under direct vision, positive ETCO2 and breath sounds checked- equal and bilateral Secured at: 21 cm Tube secured with: Tape Dental Injury: Teeth and Oropharynx as per pre-operative assessment

## 2024-08-21 NOTE — Transfer of Care (Signed)
 Immediate Anesthesia Transfer of Care Note  Patient: Sandra Wilkerson  Procedure(s) Performed: BREAST REDUCTION WITH LIPOSUCTION (Bilateral: Breast)  Patient Location: PACU  Anesthesia Type:General  Level of Consciousness: drowsy and patient cooperative  Airway & Oxygen Therapy: Patient Spontanous Breathing and Patient connected to nasal cannula oxygen  Post-op Assessment: Report given to RN and Post -op Vital signs reviewed and stable  Post vital signs: Reviewed and stable  Last Vitals:  Vitals Value Taken Time  BP 132/84 08/21/24 13:30  Temp    Pulse 108 08/21/24 13:32  Resp 23 08/21/24 13:32  SpO2 95 % 08/21/24 13:32  Vitals shown include unfiled device data.  Last Pain:  Vitals:   08/21/24 0917  TempSrc: Temporal  PainSc: 0-No pain         Complications: No notable events documented.

## 2024-08-21 NOTE — Interval H&P Note (Signed)
 History and Physical Interval Note:  08/21/2024 9:16 AM  Sandra Wilkerson  has presented today for surgery, with the diagnosis of n62.  The various methods of treatment have been discussed with the patient and family. After consideration of risks, benefits and other options for treatment, the patient has consented to  Procedure(s): BREAST REDUCTION WITH LIPOSUCTION (Bilateral) as a surgical intervention.  The patient's history has been reviewed, patient examined, no change in status, stable for surgery.  I have reviewed the patient's chart and labs.  Questions were answered to the patient's satisfaction.     [name]

## 2024-08-21 NOTE — Op Note (Signed)
 Breast Reduction Op note:    DATE OF PROCEDURE: 08/21/2024  LOCATION: [name]  SURGEON: Estefana Fritter, DO  ASSISTANT: Honora Seip, PA  PREOPERATIVE DIAGNOSIS 1. Macromastia 2. Neck Pain 3. Back Pain  POSTOPERATIVE DIAGNOSIS 1. Macromastia 2. Neck Pain 3. Back Pain  PROCEDURES 1. Bilateral breast reduction.  Right reduction 1348 g, Left reduction 1345 g  COMPLICATIONS: None.  DRAINS: none  INDICATIONS FOR PROCEDURE Sandra Wilkerson is a 28 y.o. year-old female born on 1996-03-28,with a history of symptomatic macromastia with concomitant back pain, neck pain, shoulder grooving from her bra.   MRN: [id number]  CONSENT Informed consent was obtained directly from the patient. The risks, benefits and alternatives were fully discussed. Specific risks including but not limited to bleeding, infection, hematoma, seroma, scarring, pain, nipple necrosis, asymmetry, poor cosmetic results, and need for further surgery were discussed. The patient's questions were answered.  DESCRIPTION OF PROCEDURE  Patient was brought into the operating room and rested on the operating room table in the supine position.  SCDs were placed and appropriate padding was performed.  Antibiotics were given. The patient underwent general anesthesia and the chest was prepped and draped in a sterile fashion.  A timeout was performed and all information was confirmed to be correct by those in the room. Tumescent was placed in each lateral breast.  Liposuction was done laterally.  Right side: Preoperative markings were confirmed.  Incision lines were injected with local containing epinephrine .  After waiting for vasoconstriction, the marked lines were incised with a #15 blade.  A Wise-pattern superomedial breast reduction was performed by de-epithelializing the pedicle, using bovie to create the superomedial pedicle, and removing breast tissue from the superior, lateral, and inferior  portions of the breast.  Experel and Myriad were placed. Care was taken to not undermine the breast pedicle. Hemostasis was achieved.  The nipple was gently rotated into position and the soft tissue closed with 4-0 Monocryl.   The pocket was irrigated and hemostasis confirmed.  The deep tissues were approximated with 3-0 PDS sutures.  The skin was closed with deep dermal 3-0 Monocryl and subcuticular 4-0 Monocryl sutures.  The nipple and skin flaps had good capillary refill at the end of the procedure.    Left side: Preoperative markings were confirmed.  Incision lines were injected with local containing epinephrine .  After waiting for vasoconstriction, the marked lines were incised with a #15 blade.  A Wise-pattern superomedial breast reduction was performed by de-epithelializing the pedicle, using bovie to create the superomedial pedicle, and removing breast tissue from the superior, lateral, and inferior portions of the breast. Experel and myriad were placed in the pocket.  Care was taken to not undermine the breast pedicle. Hemostasis was achieved.  The nipple was gently rotated into position and the soft tissue was closed with 4-0 Monocryl.  The patient was sat upright and size and shape symmetry was confirmed.  The pocket was irrigated and hemostasis confirmed.  The deep tissues were approximated with 3-0 PDS sutures. The skin was closed with deep dermal 3-0 Monocryl and subcuticular 4-0 Monocryl sutures.  Dermabond was applied.  A breast binder and ABDs were placed.  The nipple and skin flaps had good capillary refill at the end of the procedure.  The patient tolerated the procedure well. The patient was allowed to wake from anesthesia and taken to the recovery room in satisfactory condition.  The advanced practice practitioner (APP) assisted throughout the case.  The APP was essential  in retraction and counter traction when needed to make the case progress smoothly.  This retraction and assistance made  it possible to see the tissue plans for the procedure.  The assistance was needed for blood control, tissue re-approximation and assisted with closure of the incision site.

## 2024-08-21 NOTE — Anesthesia Postprocedure Evaluation (Signed)
 Anesthesia Post Note  Patient: Sandra Wilkerson  Procedure(s) Performed: BREAST REDUCTION WITH LIPOSUCTION (Bilateral: Breast)     Patient location during evaluation: PACU Anesthesia Type: General Level of consciousness: awake and alert Pain management: pain level controlled Vital Signs Assessment: post-procedure vital signs reviewed and stable Respiratory status: spontaneous breathing, nonlabored ventilation and respiratory function stable Cardiovascular status: blood pressure returned to baseline Postop Assessment: no apparent nausea or vomiting Anesthetic complications: no   No notable events documented.  Last Vitals:  Vitals:   08/21/24 1400 08/21/24 1431  BP: (!) 122/91 (!) 138/91  Pulse: (!) 105 96  Resp: (!) 24 16  Temp:  36.8 C  SpO2: 93% 94%    Last Pain:  Vitals:   08/21/24 1431  TempSrc:   PainSc: 3                  Vertell Row

## 2024-08-21 NOTE — Anesthesia Preprocedure Evaluation (Signed)
 Anesthesia Evaluation  Patient identified by MRN, date of birth, ID band Patient awake    Reviewed: Allergy & Precautions, NPO status , Patient's Chart, lab work & pertinent test results  History of Anesthesia Complications Negative for: history of anesthetic complications  Airway Mallampati: III  TM Distance: >3 FB Neck ROM: Full    Dental no notable dental hx. (+) Teeth Intact   Pulmonary sleep apnea , neg COPD, Patient abstained from smoking.Not current smoker   Pulmonary exam normal breath sounds clear to auscultation       Cardiovascular Exercise Tolerance: Good METS(-) hypertension(-) CAD and (-) Past MI negative cardio ROS (-) dysrhythmias  Rhythm:Regular Rate:Normal - Systolic murmurs    Neuro/Psych  Headaches  negative psych ROS   GI/Hepatic ,GERD  Controlled,,(+)     (-) substance abuse    Endo/Other  neg diabetes    Renal/GU negative Renal ROS     Musculoskeletal   Abdominal  (+) + obese  Peds  Hematology   Anesthesia Other Findings Past Medical History: No date: Anemia No date: Crohn disease (HCC) No date: PCOS (polycystic ovarian syndrome)  Reproductive/Obstetrics                              Anesthesia Physical Anesthesia Plan  ASA: 2  Anesthesia Plan: General   Post-op Pain Management: Tylenol  PO (pre-op)* and Toradol  IV (intra-op)*   Induction: Intravenous  PONV Risk Score and Plan: 3 and Ondansetron , Dexamethasone  and Midazolam   Airway Management Planned: Oral ETT  Additional Equipment: None  Intra-op Plan:   Post-operative Plan: Extubation in OR  Informed Consent: I have reviewed the patients History and Physical, chart, labs and discussed the procedure including the risks, benefits and alternatives for the proposed anesthesia with the patient or authorized representative who has indicated his/her understanding and acceptance.     Dental advisory  given  Plan Discussed with: CRNA and Surgeon  Anesthesia Plan Comments: (Discussed risks of anesthesia with patient, including PONV, sore throat, lip/dental/eye damage. Rare risks discussed as well, such as cardiorespiratory and neurological sequelae, and allergic reactions. Discussed the role of CRNA in patient's perioperative care. Patient understands.)        Anesthesia Quick Evaluation

## 2024-08-21 NOTE — Discharge Instructions (Addendum)
 INSTRUCTIONS FOR AFTER BREAST SURGERY   You will likely have some questions about what to expect following your operation.  The following information will help you and your family understand what to expect when you are discharged from the hospital.  It is important to follow these guidelines to help ensure a smooth recovery and reduce complication.  Postoperative instructions include information on: diet, wound care, medications and physical activity.  AFTER SURGERY Expect to go home after the procedure.  In some cases, you may need to spend one night in the hospital for observation.  DIET Breast surgery does not require a specific diet.  However, the healthier you eat the better your body will heal. It is important to increasing your protein intake.  This means limiting the foods with sugar and carbohydrates.  Focus on vegetables and some meat.  If you have liposuction during your procedure be sure to drink water.  If your urine is bright yellow, then it is concentrated, and you need to drink more water.  As a general rule after surgery, you should have 8 ounces of water every hour while awake.  If you find you are persistently nauseated or unable to take in liquids let us  know.  NO TOBACCO USE or EXPOSURE.  This will slow your healing process and lead to a wound.  WOUND CARE Leave the binder on for 3 days . Use fragrance free soap like Dial, Dove or Ivory.   After 3 days you can remove the binder to shower. Once dry apply binder or sports bra. If you have liposuction you will have a soft and spongy dressing (Lipofoam) that helps prevent creases in your skin.  Remove before you shower and then replace it.  It is also available on [employer]. If you have steri-strips / tape directly attached to your skin leave them in place. It is OK to get these wet.   No baths, pools or hot tubs for four weeks. We close your incision to leave the smallest and best-looking scar. No ointment or creams on your incisions  for four weeks.  No Neosporin (Too many skin reactions).  A few weeks after surgery you can use Mederma and start massaging the scar. We ask you to wear your binder or sports bra for the first 6 weeks around the clock, including while sleeping. This provides added comfort and helps reduce the fluid accumulation at the surgery site. NO Ice or heating pads to the operative site.  You have a very high risk of a BURN before you feel the temperature change.  ACTIVITY No heavy lifting until cleared by the doctor.  This usually means no more than a half-gallon of milk.  It is OK to walk and climb stairs. Moving your legs is very important to decrease your risk of a blood clot.  It will also help keep you from getting deconditioned.  Every 1 to 2 hours get up and walk for 5 minutes. This will help with a quicker recovery back to normal.  Let pain be your guide so you don't do too much.  This time is for you to recover.  You will be more comfortable if you sleep and rest with your head elevated either with a few pillows under you or in a recliner.  No stomach sleeping for a three months.  WORK Everyone returns to work at different times. As a rough guide, most people take at least 1 - 2 weeks off prior to returning to work. If  you need documentation for your job, give the forms to the front staff at the clinic.  DRIVING Arrange for someone to bring you home from the hospital after your surgery.  You may be able to drive a few days after surgery but not while taking any narcotics or valium.  BOWEL MOVEMENTS Constipation can occur after anesthesia and while taking pain medication.  It is important to stay ahead for your comfort.  We recommend taking Milk of Magnesia (2 tablespoons; twice a day) while taking the pain pills.  MEDICATIONS You may be prescribed should start after surgery At your preoperative visit for you history and physical you were given the following medications: Antibiotic: Start this  medication when you get home and take according to the instructions on the bottle. Zofran  4 mg:  This is to treat nausea and vomiting.  You can take this every 6 hours as needed and only if needed. Oxycodone  5 mg every 6 hours for 3 - 5 days.  This is to be used after you have taken the Motrin  or the Tylenol . 4.   Gabapentin 300 mg every 12 hours for 7 days.  Over the counter Medication to take: Ibuprofen  (Motrin ) 400 - 600 mg every 6 hour for 7 days Tylenol  500 mg every 6 hours for 7 days.  Only take the Oxycodone  after you have tried these two. MiraLAX  or stool softener of choice: Take this according to the bottle if you take the Norco.  If muscle work done:  Flexeril 5 mg every 12 hours for 7 days.  WHEN TO CALL Call your surgeon's office if any of the following occur: Fever 101 degrees F or greater Excessive bleeding or fluid from the incision site. Pain that increases over time without aid from the medications Redness, warmth, or pus draining from incision sites Persistent nausea or inability to take in liquids Severe misshapen area that underwent the operation.   Post Anesthesia Home Care Instructions  Activity: Get plenty of rest for the remainder of the day. A responsible individual must stay with you for 24 hours following the procedure.  For the next 24 hours, DO NOT: -Drive a car -Advertising copywriter -Drink alcoholic beverages -Take any medication unless instructed by your physician -Make any legal decisions or sign important papers.  Meals: Start with liquid foods such as gelatin or soup. Progress to regular foods as tolerated. Avoid greasy, spicy, heavy foods. If nausea and/or vomiting occur, drink only clear liquids until the nausea and/or vomiting subsides. Call your physician if vomiting continues.  Special Instructions/Symptoms: Your throat may feel dry or sore from the anesthesia or the breathing tube placed in your throat during surgery. If this causes  discomfort, gargle with warm salt water. The discomfort should disappear within 24 hours.  If you had a scopolamine  patch placed behind your ear for the management of post- operative nausea and/or vomiting:  1. The medication in the patch is effective for 72 hours, after which it should be removed.  Wrap patch in a tissue and discard in the trash. Wash hands thoroughly with soap and water. 2. You may remove the patch earlier than 72 hours if you experience unpleasant side effects which may include dry mouth, dizziness or visual disturbances. 3. Avoid touching the patch. Wash your hands with soap and water after contact with the patch.  May have Tylenol  after 3:30 pm if needed.    Information for Discharge Teaching: EXPAREL  (bupivacaine  liposome injectable suspension)   Pain relief is  important to your recovery. The goal is to control your pain so you can move easier and return to your normal activities as soon as possible after your procedure. Your physician may use several types of medicines to manage pain, swelling, and more.  Your surgeon or anesthesiologist gave you EXPAREL (bupivacaine ) to help control your pain after surgery.  EXPAREL  is a local anesthetic designed to release slowly over an extended period of time to provide pain relief by numbing the tissue around the surgical site. EXPAREL  is designed to release pain medication over time and can control pain for up to 72 hours. Depending on how you respond to EXPAREL , you may require less pain medication during your recovery. EXPAREL  can help reduce or eliminate the need for opioids during the first few days after surgery when pain relief is needed the most. EXPAREL  is not an opioid and is not addictive. It does not cause sleepiness or sedation.   Important! A teal colored band has been placed on your arm with the date, time and amount of EXPAREL  you have received. Please leave this armband in place for the full 96 hours following  administration, and then you may remove the band. If you return to the hospital for any reason within 96 hours following the administration of EXPAREL , the armband provides important information that your health care providers to know, and alerts them that you have received this anesthetic.    Possible side effects of EXPAREL : Temporary loss of sensation or ability to move in the area where medication was injected. Nausea, vomiting, constipation Rarely, numbness and tingling in your mouth or lips, lightheadedness, or anxiety may occur. Call your doctor right away if you think you may be experiencing any of these sensations, or if you have other questions regarding possible side effects.  Follow all other discharge instructions given to you by your surgeon or nurse. Eat a healthy diet and drink plenty of water or other fluids.

## 2024-08-22 ENCOUNTER — Encounter (HOSPITAL_BASED_OUTPATIENT_CLINIC_OR_DEPARTMENT_OTHER): Payer: Self-pay | Admitting: Plastic Surgery

## 2024-08-25 ENCOUNTER — Encounter: Payer: Self-pay | Admitting: Plastic Surgery

## 2024-08-25 LAB — SURGICAL PATHOLOGY

## 2024-08-26 ENCOUNTER — Other Ambulatory Visit: Payer: Self-pay | Admitting: Student

## 2024-08-26 MED ORDER — KETOROLAC TROMETHAMINE 10 MG PO TABS: 10.0000 mg | ORAL_TABLET | Freq: Three times a day (TID) | ORAL | 0 refills | Status: DC | PRN

## 2024-08-26 NOTE — Progress Notes (Signed)
 Called patient in regards to her MyChart message.  Her mother answered the phone.  She states that the patient is currently sleeping.  She states that the patient has been nauseous and threw up once or twice.  Patient's mother states that patient does not have the Zofran  that was sent into her at her preoperative appointment.  She states that she never picked it up with the other medications.  She reports the patient has felt warm, but she states that she has not gotten a temperature under.  She states that the patient has been experiencing pain to both breasts, fluctuating between 5 out of 10 pain and 8 out of 10 pain.  She denies the patient having any redness, swelling, or drainage.  She reports that the patient has been tolerating food and fluids.  She reports that the patient has not complained of any chest pain or shortness of breath.  She does report patient has also had a headache.  Recommended that patient/her mother pick up the Zofran  at the pharmacy that was sent in at her preoperative appointment.  Discussed with her to see if this might help with her nausea.  Encouraged the patient's mother to have the patient drink plenty of fluids, including liquid IV or Gatorade.  She expressed understanding.  Recommended that she take the patient's temperature and monitor her temperature.  Recommended Toradol  for the pain.  Patient's mother denies the patient having any history of gastric ulcers, kidney disease or clotting disorders.  I discussed with the patient's mother that it is very important that she does not take any other NSAIDs while taking the Toradol .  I discussed with her she may supplement with Tylenol .  Patient's mother expressed understanding.  I discussed with the patient's mother that if she develops any worsening symptoms, fevers, chills, redness, swelling, patient should be evaluated in the emergency room.  She expressed understanding.  I discussed with the patient's mother that if the  patient has any questions or concerns she may call us .  I discussed with the patient's mother that if headache persists or worsens, she needs to be evaluated at an urgent care or the emergency room.  She expressed understanding.  We will plan to see the patient in clinic tomorrow for evaluation.

## 2024-08-27 ENCOUNTER — Encounter: Payer: Self-pay | Admitting: Student

## 2024-08-27 ENCOUNTER — Encounter: Admitting: Student

## 2024-08-27 ENCOUNTER — Ambulatory Visit (INDEPENDENT_AMBULATORY_CARE_PROVIDER_SITE_OTHER): Admitting: Student

## 2024-08-27 VITALS — BP 139/92 | HR 68 | Temp 98.8°F | Ht 59.0 in | Wt 186.8 lb

## 2024-08-27 DIAGNOSIS — Z9889 Other specified postprocedural states: Secondary | ICD-10-CM

## 2024-08-27 DIAGNOSIS — N62 Hypertrophy of breast: Secondary | ICD-10-CM

## 2024-08-27 MED ORDER — DOXYCYCLINE HYCLATE 100 MG PO TABS: 100.0000 mg | ORAL_TABLET | Freq: Two times a day (BID) | ORAL | 0 refills | Status: AC

## 2024-08-27 MED ORDER — TRAMADOL HCL 50 MG PO TABS: 50.0000 mg | ORAL_TABLET | Freq: Three times a day (TID) | ORAL | 0 refills | Status: DC | PRN

## 2024-08-27 NOTE — Progress Notes (Signed)
 Patient is a 28 year old female who recently had bilateral breast reduction with Dr. Lowery on 08/21/2024.  She presents to the clinic today with concerns about pain and nausea.  Patient messaged the clinic yesterday stating that she was experiencing nausea and pain.  There were no fevers reported.  She had not taken Zofran  as she had not picked it up.  Toradol  was sent in for the patient for pain and is recommended she take Zofran  for the nausea.  Today, patient presents with her mother at bedside.  She reports that she is feeling better today.  She states that she has not been nauseous and has not vomited.  She states that her pharmacy did not have any of the medications.  She states that she is still having some pain from time to time, especially on the sides.  She states that it is okay during the day, but worse at night.  She does not report any fevers or chills.  She does not report any chest pain or shortness of breath.  She states that she has been eating and drinking without any issue.  Vitals:   08/27/24 1313  BP: (!) 139/92  Pulse: 68  Temp: 98.8 F (37.1 C)  SpO2: 99%  Vitals today are stable.  Patient is afebrile.  Chaperone present on exam.  On exam, patient is sitting upright in no acute distress.  Breasts are overall soft and symmetric.  There is some swelling noted bilaterally.  Skin is not taut to either breast.  There is some overlying ecchymosis, especially noted to the right inferior/lateral breast.  There is mild tenderness to palpation in this area and some mild firmness, consistent with may be a small hematoma.  Skin to this area appears to be mildly irritated.  Bolsters are in place over the NAC's bilaterally.  Dressings are in place over the incisions and are clean dry and intact.  There are no signs of infection on exam.  Discussed with the patient that she has a little bit of bruising and swelling and that she may have a very small hematoma to her right breast, but it  is difficult to say for sure.  Discussed with her that I would like her to continue with compression at all times.  I discussed with her she could put a little bit of Vaseline where the skin is irritated.  Discussed with her to closely monitor the area and if she develops any worsening swelling, pain or concerning symptoms she should call us  right away.  I discussed with her I do like her to avoid NSAIDs for now.  Discussed with her to not take the Toradol  that was sent in yesterday.  She states that she did not pick this up.  She expressed understanding.  Given pain and the possibility of a small hematoma, will send in some antibiotics to cover her.  I did offer to have her just monitor the area for a little bit, but patient opted to start some antibiotics.  Discussed with the patient that the antibiotics may make her nauseous.  Recommended she pick up the Zofran  from the pharmacy that was sent at her preoperative appointment.  She expressed understanding.  Will send in tramadol  for the pain.  Discussed with her to like her to take Tylenol  first for her pain before taking the tramadol  and that she should only take the tramadol  if absolutely necessary.  Patient expressed understanding.  I would like the patient to follow back up on  Friday at her scheduled appointment for reevaluation and removal of the bolsters.  I discussed with her to call in the meantime if she has any worsening symptoms or has any questions or concerns.  She expressed understanding.  Pictures were obtained of the patient and placed in the chart with the patient's or guardian's permission.

## 2024-08-27 NOTE — Addendum Note (Signed)
 Addended by: ANDRIS STAGGER on: 08/27/2024 01:51 PM   Modules accepted: Orders

## 2024-08-28 NOTE — Telephone Encounter (Signed)
 Called patient's pharmacy and they state that the Zofran , tramadol  and doxycycline are ready for the patient to pick up.  I called the patient and discussed this with her.  I discussed with her to not take that Toradol /NSAIDs at this time.  She expressed understanding.

## 2024-08-29 ENCOUNTER — Ambulatory Visit (INDEPENDENT_AMBULATORY_CARE_PROVIDER_SITE_OTHER): Admitting: Student

## 2024-08-29 ENCOUNTER — Telehealth: Payer: Self-pay

## 2024-08-29 VITALS — BP 125/83 | HR 87

## 2024-08-29 DIAGNOSIS — N62 Hypertrophy of breast: Secondary | ICD-10-CM

## 2024-08-29 NOTE — Telephone Encounter (Signed)
 Faxed [facility]  order:  Silicone borered foam-daily Rice

## 2024-08-29 NOTE — Progress Notes (Signed)
 Patient is a 28 year old female who recently had bilateral breast reduction with Dr. Lowery on 08/21/2024.  Patient is a little over 1 week postop.  She presents to the clinic today for postoperative follow-up.     Patient was last seen in the clinic on 08/27/2024.  At this visit, patient reported she was experiencing nausea and vomiting, but reported that this was improving.  She reported she was still having some pain on the sides.  On exam, breasts were overall soft and symmetric.  There was some swelling noted bilaterally, skin was not taut to either breast.  There was some overlying ecchymosis, especially noted to the right inferior/lateral breast.  There is some mild tenderness to palpation in the area and some mild firmness consistent with maybe a small hematoma.  Bolsters were in place over the NAC's bilaterally.  It was recommended that patient continue with compression at all times and closely monitor her surgical sites.  Today, patient reports she is doing well.  She presents with her mother at bedside.  She states that she has not had any more episodes of nausea or vomiting.  She states that she is still having some pain, especially on the right side.  She denies any fevers or chills.  She denies any increased swelling in either of her breast.  She reports she has been eating and drinking without issue.  She states that she has been ambulating without issue.  Chaperone present on exam.  On exam, patient sitting upright in no acute distress.  Breasts are overall soft and fairly symmetric.  Skin is not taut to either breast.  There is still some firmness noted to the right inferior/lateral breast with overlying ecchymosis.  It appears to be consistent with a small hematoma.  There is no overlying erythema to either breast.  There is a little bit of ecchymosis noted around the NAC's bilaterally.  Bolsters are in place over the NAC's.  These were removed.  NAC's are approximately 4 x 4 x 0.10 cm.   NAC's bilaterally appear to be a little bit dark, as expected after free nipple graft.  Mepilex border dressings are in place with a small amount of drainage on them.  They were removed.  Steri-Strips are in place over the incisions bilaterally, Steri-Strips to the bilateral inframammary incisions appear to be soiled.  These were removed.  Incision underneath appears to be intact.  Area was cleaned with Vashe and dried, new Steri-Strips were applied.  New Mepilex border dressings were applied.  There are no signs of infection on exam.  Discussed with the patient the importance of continue with compression at all times.  I discussed with her to continue to monitor her right breast.  Discussed with her she can gently massage the area to help break up the small hematoma.  She expressed understanding.  Discussed with the patient to apply Xeroform daily over her NAC's.  Will try to order supplies through [facility] .  Discussed with her that if supplies do not come, she may put a thick layer of Vaseline over the NAC's and cover with a nonstick gauze.  She expressed understanding.  Encouraged patient to continue to ambulate and to make sure she is drinking plenty of fluids.  Discussed with the patient to closely monitor her breasts/surgical sites.  I discussed with her that if she has any increase swelling, pain, fevers, chills or has any concerns, I would like her to have a low threshold for calling us .  She  expressed understanding.  Instructed the patient to call if she has any questions or concerns about anything.  Pictures were obtained of the patient and placed in the chart with the patient's or guardian's permission.

## 2024-08-29 NOTE — Telephone Encounter (Signed)
 Received fax confirmation: This message was sent via FAXCOM, a product from [employer]. [email or web address]                    -------Fax Transmission Report-------  To:               Recipient at [phone] Subject:          SECURED Result:           The transmission was successful. Explanation:      All Pages Ok Pages Sent:       3 Connect Time:     1 minutes, 45 seconds Transmit Time:    08/29/2024 14:04 Transfer Rate:    14400 Status Code:      0000 Retry Count:      0 Job Id:           [id number] Unique Id:        [id number] Fax Line:         65 Fax Server:       [id number]

## 2024-09-03 NOTE — Telephone Encounter (Signed)
 Patient called to follow up because she hasn't received anything and didn't know how long it takes. She also asked if maybe there is any delays if there is a store she can get the products from. She asked for a call back at [phone].

## 2024-09-09 ENCOUNTER — Encounter: Payer: Self-pay | Admitting: Plastic Surgery

## 2024-09-09 ENCOUNTER — Ambulatory Visit: Admitting: Plastic Surgery

## 2024-09-09 VITALS — BP 120/86 | HR 94 | Ht 59.0 in | Wt 183.4 lb

## 2024-09-09 DIAGNOSIS — N62 Hypertrophy of breast: Secondary | ICD-10-CM

## 2024-09-09 DIAGNOSIS — Z9889 Other specified postprocedural states: Secondary | ICD-10-CM

## 2024-09-09 NOTE — Progress Notes (Signed)
 The patient is a 28 year old female here for follow-up after undergoing bilateral breast reduction.  The patient's reduction was November 13 and she had over 1300 g removed from both breast.  She had a nipple areola grafting.  There appears to be good take.  She has some sloughing of the pigment.  This will take some time to come back.  Continue with Xeroform to the graft sites.  Also recommend cleaning with Vashe.  Follow-up in 1 to 2 weeks.

## 2024-09-15 ENCOUNTER — Ambulatory Visit: Admitting: Student

## 2024-09-15 DIAGNOSIS — N62 Hypertrophy of breast: Secondary | ICD-10-CM

## 2024-09-15 NOTE — Progress Notes (Signed)
 Patient is a 28 year old female who recently had bilateral breast reduction with amputation technique with Dr. Lowery on 08/21/2024.  Patient is a little over 3 weeks postop.  She presents to the clinic today with concerns about her right breast.  Patient was last seen in the clinic on 09/09/2024.  At this visit, it was noted that the nipple areolar grafting had good take.  There was some sloughing of the pigment.  Plan was for patient to continue with Xeroform to the graft sites.  Today, patient reports she is doing well.  She states that her breasts still feel swollen bilaterally, more so the right than the left.  She reports that starting on Saturday, she noticed brownish drainage from the right breast.  She denies any fevers or chills.  She reports has been eating and drinking without issue.  She denies any dizziness or lightheadedness.  She denies any other issues or concerns at this time.  Chaperone present on exam.  On exam, patient is sitting upright in no acute distress.  Breasts are overall soft and the skin is not taut.  The right breast is slightly bigger than the left breast.  There is some firmness noted to the right inferior/lateral aspect of the right breast consistent with a small hematoma.  There are no overlying skin changes.  There is no fluid wave noted on exam to either breast.  There is no overlying erythema to either breast.  NAC's appear to be overall healing well.  There does appear to be some pigment starting to come back to the areolas.  There is some epidermal lysis noted centrally.  Steri-Strips were removed from the incision.  Incisions overall appear to be intact and healing well.  There were several Monocryl sutures that were noted.  These were cut and removed and patient tolerated well.  There is a small wound noted to the right lateral inframammary incision which is draining thin brownish drainage.  There are no signs of infection on exam.  I was able to massage out a  moderate amount of brownish drainage consistent with old blood/residual hematoma.  Patient felt relief and the breast felt softer after massage.  Discussed with the patient to continue with compression at all times.  Discussed with her to allow the area to the right lateral inframammary incision to continue to drain.  Discussed with her she may place gauze over this area.  Discussed with her to change daily or as needed for saturation.  Discussed with the patient to continue with massage, recommended she massage in a lateral fashion to help facilitate the drainage through the small wound.  Patient expressed understanding.  Recommended that patient apply Vaseline throughout her incisions besides the small wound to the right lateral inframammary incision.  Discussed with her to continue with Xeroform dressings over the NAC's.  Patient expressed understanding.  I would like the patient to follow back up next week.  Instructed her to call in the meantime if she has any questions or concerns about anything.

## 2024-09-15 NOTE — Telephone Encounter (Signed)
Patient is going to come in this afternoon.

## 2024-09-21 ENCOUNTER — Other Ambulatory Visit: Payer: Self-pay

## 2024-09-21 ENCOUNTER — Encounter (HOSPITAL_COMMUNITY): Payer: Self-pay | Admitting: Emergency Medicine

## 2024-09-21 ENCOUNTER — Emergency Department (HOSPITAL_COMMUNITY)

## 2024-09-21 ENCOUNTER — Inpatient Hospital Stay (HOSPITAL_COMMUNITY)
Admission: EM | Admit: 2024-09-21 | Discharge: 2024-09-25 | DRG: 862 | Disposition: A | Attending: Family Medicine | Admitting: Family Medicine

## 2024-09-21 DIAGNOSIS — R7401 Elevation of levels of liver transaminase levels: Secondary | ICD-10-CM | POA: Diagnosis present

## 2024-09-21 DIAGNOSIS — E66812 Obesity, class 2: Secondary | ICD-10-CM | POA: Diagnosis present

## 2024-09-21 DIAGNOSIS — K5090 Crohn's disease, unspecified, without complications: Secondary | ICD-10-CM | POA: Diagnosis present

## 2024-09-21 DIAGNOSIS — Z8249 Family history of ischemic heart disease and other diseases of the circulatory system: Secondary | ICD-10-CM

## 2024-09-21 DIAGNOSIS — T368X5A Adverse effect of other systemic antibiotics, initial encounter: Secondary | ICD-10-CM | POA: Diagnosis not present

## 2024-09-21 DIAGNOSIS — Z820 Family history of epilepsy and other diseases of the nervous system: Secondary | ICD-10-CM

## 2024-09-21 DIAGNOSIS — Z833 Family history of diabetes mellitus: Secondary | ICD-10-CM

## 2024-09-21 DIAGNOSIS — T8144XA Sepsis following a procedure, initial encounter: Secondary | ICD-10-CM | POA: Diagnosis present

## 2024-09-21 DIAGNOSIS — E876 Hypokalemia: Secondary | ICD-10-CM | POA: Diagnosis present

## 2024-09-21 DIAGNOSIS — Z79620 Long term (current) use of immunosuppressive biologic: Secondary | ICD-10-CM

## 2024-09-21 DIAGNOSIS — D509 Iron deficiency anemia, unspecified: Secondary | ICD-10-CM | POA: Diagnosis present

## 2024-09-21 DIAGNOSIS — N611 Abscess of the breast and nipple: Secondary | ICD-10-CM | POA: Diagnosis present

## 2024-09-21 DIAGNOSIS — B9620 Unspecified Escherichia coli [E. coli] as the cause of diseases classified elsewhere: Secondary | ICD-10-CM | POA: Diagnosis present

## 2024-09-21 DIAGNOSIS — T8142XA Infection following a procedure, deep incisional surgical site, initial encounter: Principal | ICD-10-CM | POA: Diagnosis present

## 2024-09-21 DIAGNOSIS — Z79899 Other long term (current) drug therapy: Secondary | ICD-10-CM

## 2024-09-21 DIAGNOSIS — Z6837 Body mass index (BMI) 37.0-37.9, adult: Secondary | ICD-10-CM

## 2024-09-21 DIAGNOSIS — A419 Sepsis, unspecified organism: Secondary | ICD-10-CM | POA: Diagnosis present

## 2024-09-21 DIAGNOSIS — L299 Pruritus, unspecified: Secondary | ICD-10-CM | POA: Diagnosis not present

## 2024-09-21 DIAGNOSIS — L7632 Postprocedural hematoma of skin and subcutaneous tissue following other procedure: Secondary | ICD-10-CM | POA: Diagnosis present

## 2024-09-21 DIAGNOSIS — Y838 Other surgical procedures as the cause of abnormal reaction of the patient, or of later complication, without mention of misadventure at the time of the procedure: Secondary | ICD-10-CM | POA: Diagnosis present

## 2024-09-21 DIAGNOSIS — Z98891 History of uterine scar from previous surgery: Secondary | ICD-10-CM

## 2024-09-21 DIAGNOSIS — Z825 Family history of asthma and other chronic lower respiratory diseases: Secondary | ICD-10-CM

## 2024-09-21 LAB — COMPREHENSIVE METABOLIC PANEL WITH GFR
ALT: 62 U/L — ABNORMAL HIGH (ref 0–44)
AST: 38 U/L (ref 15–41)
Albumin: 3.2 g/dL — ABNORMAL LOW (ref 3.5–5.0)
Alkaline Phosphatase: 111 U/L (ref 38–126)
Anion gap: 13 (ref 5–15)
BUN: 9 mg/dL (ref 6–20)
CO2: 22 mmol/L (ref 22–32)
Calcium: 8.9 mg/dL (ref 8.9–10.3)
Chloride: 100 mmol/L (ref 98–111)
Creatinine, Ser: 0.79 mg/dL (ref 0.44–1.00)
GFR, Estimated: >60 mL/min (ref >60)
Glucose, Bld: 125 mg/dL — ABNORMAL HIGH (ref 70–99)
Potassium: 3.1 mmol/L — ABNORMAL LOW (ref 3.5–5.1)
Sodium: 135 mmol/L (ref 135–145)
Total Bilirubin: 1.2 mg/dL (ref 0.0–1.2)
Total Protein: 7.7 g/dL (ref 6.5–8.1)

## 2024-09-21 LAB — CBC WITH DIFFERENTIAL/PLATELET
Abs Immature Granulocytes: 0.08 K/uL — ABNORMAL HIGH (ref 0.00–0.07)
Basophils Absolute: 0.1 K/uL (ref 0.0–0.1)
Basophils Relative: 0 %
Eosinophils Absolute: 0.2 K/uL (ref 0.0–0.5)
Eosinophils Relative: 1 %
HCT: 35.7 % — ABNORMAL LOW (ref 36.0–46.0)
Hemoglobin: 11.5 g/dL — ABNORMAL LOW (ref 12.0–15.0)
Immature Granulocytes: 1 %
Lymphocytes Relative: 17 %
Lymphs Abs: 2.8 K/uL (ref 0.7–4.0)
MCH: 26.4 pg (ref 26.0–34.0)
MCHC: 32.2 g/dL (ref 30.0–36.0)
MCV: 81.9 fL (ref 80.0–100.0)
Monocytes Absolute: 0.9 K/uL (ref 0.1–1.0)
Monocytes Relative: 5 %
Neutro Abs: 12.4 K/uL — ABNORMAL HIGH (ref 1.7–7.7)
Neutrophils Relative %: 76 %
Platelets: 323 K/uL (ref 150–400)
RBC: 4.36 MIL/uL (ref 3.87–5.11)
RDW: 13.5 % (ref 11.5–15.5)
WBC: 16.4 K/uL — ABNORMAL HIGH (ref 4.0–10.5)
nRBC: 0 % (ref 0.0–0.2)

## 2024-09-21 LAB — PROTIME-INR
INR: 1.1 (ref 0.8–1.2)
Prothrombin Time: 15.3 s — ABNORMAL HIGH (ref 11.4–15.2)

## 2024-09-21 LAB — I-STAT CG4 LACTIC ACID, ED: Lactic Acid, Venous: 1.8 mmol/L (ref 0.5–1.9)

## 2024-09-21 LAB — HCG, SERUM, QUALITATIVE: Preg, Serum: NEGATIVE

## 2024-09-21 MED ORDER — IOHEXOL 350 MG/ML SOLN
75.0000 mL | Freq: Once | INTRAVENOUS | Status: AC | PRN
  Administered 2024-09-21: 75 mL via INTRAVENOUS

## 2024-09-21 MED ORDER — VANCOMYCIN HCL 1500 MG/300ML IV SOLN
1500.0000 mg | Freq: Two times a day (BID) | INTRAVENOUS | Status: DC
  Filled 2024-09-21: qty 300

## 2024-09-21 MED ORDER — VANCOMYCIN HCL 1.5 G IV SOLR
1500.0000 mg | Freq: Once | INTRAVENOUS | Status: DC
  Filled 2024-09-21: qty 30

## 2024-09-21 MED ORDER — ONDANSETRON HCL 4 MG/2ML IJ SOLN
4.0000 mg | Freq: Once | INTRAMUSCULAR | Status: AC
  Administered 2024-09-21: 4 mg via INTRAVENOUS
  Filled 2024-09-21: qty 2

## 2024-09-21 MED ORDER — LACTATED RINGERS IV BOLUS
1000.0000 mL | Freq: Once | INTRAVENOUS | Status: AC
  Administered 2024-09-21: 1000 mL via INTRAVENOUS

## 2024-09-21 MED ORDER — POTASSIUM CHLORIDE CRYS ER 20 MEQ PO TBCR
40.0000 meq | EXTENDED_RELEASE_TABLET | Freq: Once | ORAL | Status: AC
  Administered 2024-09-22: 01:00:00 40 meq via ORAL
  Filled 2024-09-21: qty 2

## 2024-09-21 MED ORDER — ACETAMINOPHEN 500 MG PO TABS
1000.0000 mg | ORAL_TABLET | ORAL | Status: AC
  Administered 2024-09-21: 1000 mg via ORAL
  Filled 2024-09-21: qty 2

## 2024-09-21 MED ORDER — MORPHINE SULFATE (PF) 4 MG/ML IV SOLN
4.0000 mg | Freq: Once | INTRAVENOUS | Status: AC
  Administered 2024-09-21: 4 mg via INTRAVENOUS
  Filled 2024-09-21: qty 1

## 2024-09-21 MED ORDER — VANCOMYCIN HCL IN DEXTROSE 1-5 GM/200ML-% IV SOLN: 1000.0000 mg | Freq: Once | INTRAVENOUS | Status: DC

## 2024-09-21 NOTE — ED Triage Notes (Signed)
 Patient states she has been having chills, bodyaches. She had a breast reduction 4 weeks ago and she had to get her right breast drained 2 weeks ago. She states the right breast is swelling again.

## 2024-09-21 NOTE — ED Provider Triage Note (Signed)
 Emergency Medicine Provider Triage Evaluation Note  Sandra Wilkerson , a 28 y.o. female  was evaluated in triage.  Pt complains of generalized body aches and breast pain.  Patient had a breast reduction surgery approximately 4 to 5 weeks ago.  Patient did report having to have the right breast drained about 2 weeks ago.  She presents emergency department for evaluation of right breast pain on the lateral side as well as under the armpit.  In triage, patient's heart rate is elevated to the mid 120s and she is febrile to 100.6.  Sepsis order set in place.  Review of Systems  Positive: Body aches, breast pain, fever Negative:   Physical Exam  BP (!) 144/79 (BP Location: Right Arm)   Pulse (!) 118   Temp (!) 100.6 F (38.1 C) (Oral)   Resp 18   Ht 4' 11 (1.499 m)   Wt 83.1 kg   SpO2 100%   BMI 37.00 kg/m  Gen:   Awake, no distress   Resp:  Normal effort, tachycardic to mid 120s MSK:   Moves extremities without difficulty  Other:  Right breast is tender to palpation on the lateral side.  Area does feel indurated, however no fluctuance is noted.  Patient does have a nonadhesive bandage over the nipple.  There is no active bleeding at this time.  Medical Decision Making  Medically screening exam initiated at 8:59 PM.  Appropriate orders placed.  Sandra Wilkerson was informed that the remainder of the evaluation will be completed by another provider, this initial triage assessment does not replace that evaluation, and the importance of remaining in the ED until their evaluation is complete.  MD and charge aware.  Patient will be roomed shortly.  I did order sepsis order set in triage.   Torrence Marry RAMAN, PA-C 09/21/24 2101

## 2024-09-21 NOTE — ED Provider Notes (Signed)
 [place] EMERGENCY DEPARTMENT AT [facility] Provider Note   CSN: [id number] Arrival date & time: 09/21/24  2050     Patient presents with: Generalized Body Aches, Chills, and Post-op Problem   Sandra Wilkerson is a 28 y.o. female.   Patient is a 29 year old female with a past medical history of Crohn's and approximately 4 weeks post bilateral breast reduction presenting to the emergency department with right sided breast pain and fever.  The patient states that she woke up this morning feeling feverish with bodyaches and chills.  She states that she is having pain around the right lateral incisions of the right breast with a small amount of brownish drainage.  She states that she had some drainage about 2 weeks ago that was drained in the office and has been doing well since.  The patient states that she has some nausea but denies any vomiting.  Denies any cough, congestion or sore throat, abdominal pain, diarrhea, dysuria or hematuria.  The history is provided by the patient.       Prior to Admission medications  Medication Sig Start Date End Date Taking? Authorizing Provider  colestipol  (COLESTID ) 1 g tablet Take 2 g by mouth 2 (two) times daily. At least 1 hour separate from other medications Patient not taking: Reported on 09/15/2024    [provider]  EPINEPHrine  (EPIPEN  2-PAK) 0.3 mg/0.3 mL IJ SOAJ injection Inject 0.3 mg into the muscle as needed for anaphylaxis. 12/16/23   Patel, Sona, MD  fluticasone  (FLONASE ) 50 MCG/ACT nasal spray Place 1 spray into both nostrils daily. Patient not taking: Reported on 09/15/2024 10/12/23 10/11/24  Scoggins, [facility], NP  ondansetron  (ZOFRAN ) 4 MG tablet Take 1 tablet (4 mg total) by mouth every 8 (eight) hours as needed for up to 15 doses for nausea or vomiting. 08/04/24   Andris Estefana FORBES, PA-C  Risankizumab-rzaa (SKYRIZI) 360 MG/2.4ML SOCT Inject 360 mg into the skin. 03/18/24   [provider]  traMADol  (ULTRAM ) 50  MG tablet Take 1 tablet (50 mg total) by mouth every 8 (eight) hours as needed for up to 8 doses for moderate pain (pain score 4-6) or severe pain (pain score 7-10). 08/27/24   Andris Estefana FORBES, PA-C    Allergies: Infliximab , Other, and Risankizumab    Review of Systems  Updated Vital Signs BP 113/77   Pulse (!) 103   Temp (!) 100.6 F (38.1 C) (Oral)   Resp 15   Ht 4' 11 (1.499 m)   Wt 83.1 kg   SpO2 97%   BMI 37.00 kg/m   Physical Exam Vitals and nursing note reviewed. Exam conducted with a chaperone present (Kalie Barreiro RN).  Constitutional:      Appearance: Normal appearance.  HENT:     Head: Normocephalic and atraumatic.     Nose: Nose normal.     Mouth/Throat:     Mouth: Mucous membranes are moist.     Pharynx: Oropharynx is clear.  Eyes:     Extraocular Movements: Extraocular movements intact.     Conjunctiva/sclera: Conjunctivae normal.  Cardiovascular:     Rate and Rhythm: Regular rhythm. Tachycardia present.     Heart sounds: Normal heart sounds.  Pulmonary:     Effort: Pulmonary effort is normal.     Breath sounds: Normal breath sounds.  Abdominal:     General: Abdomen is flat.     Palpations: Abdomen is soft.     Tenderness: There is no abdominal tenderness.  Musculoskeletal:  General: Normal range of motion.  Skin:    General: Skin is warm and dry.     Comments: Small area of dehiscence of surgical scar at lateral aspect of R breast, no active drainage, induration and tenderness to palpation along inferior surgical scar, no palpable fluctuance Scar appears to be well healing on the L breast with some induration and tenderness along the left lateral inferior scar  Neurological:     General: No focal deficit present.     Mental Status: She is alert and oriented to person, place, and time.  Psychiatric:        Mood and Affect: Mood normal.        Behavior: Behavior normal.     (all labs ordered are listed, but only abnormal results are  displayed) Labs Reviewed  COMPREHENSIVE METABOLIC PANEL WITH GFR - Abnormal; Notable for the following components:      Result Value   Potassium 3.1 (*)    Glucose, Bld 125 (*)    Albumin 3.2 (*)    ALT 62 (*)    All other components within normal limits  CBC WITH DIFFERENTIAL/PLATELET - Abnormal; Notable for the following components:   WBC 16.4 (*)    Hemoglobin 11.5 (*)    HCT 35.7 (*)    Neutro Abs 12.4 (*)    Abs Immature Granulocytes 0.08 (*)    All other components within normal limits  PROTIME-INR - Abnormal; Notable for the following components:   Prothrombin Time 15.3 (*)    All other components within normal limits  CULTURE, BLOOD (ROUTINE X 2)  CULTURE, BLOOD (ROUTINE X 2)  HCG, SERUM, QUALITATIVE  URINALYSIS, W/ REFLEX TO CULTURE (INFECTION SUSPECTED)  I-STAT CG4 LACTIC ACID, ED    EKG: None  Radiology: CT Chest W Contrast Result Date: 09/21/2024 EXAM: CT CHEST WITH CONTRAST 09/21/2024 11:06:00 PM TECHNIQUE: CT of the chest was performed with the administration of 75 mL of iohexol  (OMNIPAQUE ) 350 MG/ML injection. Multiplanar reformatted images are provided for review. Automated exposure control, iterative reconstruction, and/or weight based adjustment of the mA/kV was utilized to reduce the radiation dose to as low as reasonably achievable. COMPARISON: 12/15/2023 CLINICAL HISTORY: Sepsis, recent breast reduction, eval for abscess. FINDINGS: MEDIASTINUM: Heart and pericardium are unremarkable. The central airways are clear. The thoracic aorta is within normal limits. 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 The esophagus is within normal limits. LYMPH NODES: No mediastinal, hilar or axillary lymphadenopathy. LUNGS AND PLEURA: The lungs are well aerated bilaterally. No focal infiltrate or pulmonary edema. No pleural effusion or pneumothorax. No evidence of pulmonary embolism is seen, although the study is not timed for  embolus evaluation. SOFT TISSUES/BONES: No acute bony abnormality is noted. There are changes consistent with the given clinical history of bilateral breast reduction. There is a mildly enhancing fluid collection identified near the inferior aspect of the right breast measuring 7.2 x 5.5 cm. This extends for approximately 11 cm in craniocaudal projection. A similar but smaller fluid collection is noted on the left measuring 4.5 x 3.2 cm and extending for at least 7 cm in craniocaudal projection. UPPER ABDOMEN: Limited images of the upper abdomen demonstrates no acute abnormality. Visualized upper abdomen is unremarkable. IMPRESSION: 1. Bilateral peripherally enhancing fluid collections in the area of prior surgery, right greater than left. Although these may simply represent postoperative seromas, the possibility of developing abscess deserves consideration. Fluid sampling/drainage is recommended. Electronically signed by: Oneil Devonshire  MD 09/21/2024 11:15 PM EST RP Workstation: [id number]     Procedures   Medications Ordered in the ED  potassium chloride  SA (KLOR-CON  M) CR tablet 40 mEq (has no administration in time range)  Vancomycin  (VANCOCIN ) 1,500 mg in sodium chloride  0.9 % 500 mL IVPB (has no administration in time range)  acetaminophen  (TYLENOL ) tablet 1,000 mg (1,000 mg Oral Given 09/21/24 2141)  morphine  (PF) 4 MG/ML injection 4 mg (4 mg Intravenous Given 09/21/24 2153)  ondansetron  (ZOFRAN ) injection 4 mg (4 mg Intravenous Given 09/21/24 2151)  lactated ringers  bolus 1,000 mL (1,000 mLs Intravenous New Bag/Given 09/21/24 2156)  iohexol  (OMNIPAQUE ) 350 MG/ML injection 75 mL (75 mLs Intravenous Contrast Given 09/21/24 2306)    Clinical Course as of 09/21/24 2336  Sun Sep 21, 2024  2243 Labs with hypokalemia, will be repleted. CT pending.  [VK]  2318 CT chest with bilateral fluid collections R > L concerning for seroma vs abscess. Abx ordered. Will consult patient's plastic surgeon - Estefana Holt. [VK]  2334 Patient signed out to Dr. Haze pending plastics recommendations.  [VK]    Clinical Course User Index [VK] Kingsley, Mirriam Vadala K, DO                                 Medical Decision Making This patient presents to the ED with chief complaint(s) of breast pain, fever with pertinent past medical history of crohn's, 4 weeks post-op bilateral breast reduction which further complicates the presenting complaint. The complaint involves an extensive differential diagnosis and also carries with it a high risk of complications and morbidity.    The differential diagnosis includes cellulitis, abscess, sepsis, other postop complication, abscess, hematoma  Additional history obtained: Additional history obtained from spouse Records reviewed plastic surgery records  ED Course and Reassessment: On patient's arrival she was febrile and tachycardic and otherwise hemodynamically stable in no acute distress.  She had labs to evaluate for sepsis initiated in triage.  Patient will additionally have CT imaging of her chest to evaluate for abscess or other deep space infection.  She was given pain and nausea control and Tylenol  for her fever and will be closely reassessed.  Independent labs interpretation:  The following labs were independently interpreted: leukocytosis, mild hypokalemia  Independent visualization of imaging: - I independently visualized the following imaging with scope of interpretation limited to determining acute life threatening conditions related to emergency care: CT chest, which revealed R > L breast fluid collection  Consultation: - Consulted or discussed management/test interpretation w/ external professional: plastic surgery    Amount and/or Complexity of Data Reviewed Radiology: ordered.  Risk Prescription drug management.       Final diagnoses:  Infection of deep incisional surgical site after procedure, initial encounter    ED Discharge  Orders     None          Ellouise Richerd POUR, DO 09/21/24 2337

## 2024-09-22 ENCOUNTER — Inpatient Hospital Stay (HOSPITAL_COMMUNITY)

## 2024-09-22 ENCOUNTER — Telehealth: Payer: Self-pay | Admitting: Physician Assistant

## 2024-09-22 ENCOUNTER — Encounter: Admitting: Physician Assistant

## 2024-09-22 DIAGNOSIS — N611 Abscess of the breast and nipple: Secondary | ICD-10-CM | POA: Diagnosis present

## 2024-09-22 DIAGNOSIS — T368X5A Adverse effect of other systemic antibiotics, initial encounter: Secondary | ICD-10-CM | POA: Diagnosis not present

## 2024-09-22 DIAGNOSIS — Y838 Other surgical procedures as the cause of abnormal reaction of the patient, or of later complication, without mention of misadventure at the time of the procedure: Secondary | ICD-10-CM | POA: Diagnosis present

## 2024-09-22 DIAGNOSIS — T8142XA Infection following a procedure, deep incisional surgical site, initial encounter: Secondary | ICD-10-CM | POA: Diagnosis present

## 2024-09-22 DIAGNOSIS — Z820 Family history of epilepsy and other diseases of the nervous system: Secondary | ICD-10-CM | POA: Diagnosis not present

## 2024-09-22 DIAGNOSIS — Z98891 History of uterine scar from previous surgery: Secondary | ICD-10-CM | POA: Diagnosis not present

## 2024-09-22 DIAGNOSIS — L7632 Postprocedural hematoma of skin and subcutaneous tissue following other procedure: Secondary | ICD-10-CM | POA: Diagnosis present

## 2024-09-22 DIAGNOSIS — D509 Iron deficiency anemia, unspecified: Secondary | ICD-10-CM | POA: Diagnosis present

## 2024-09-22 DIAGNOSIS — E66812 Obesity, class 2: Secondary | ICD-10-CM | POA: Diagnosis present

## 2024-09-22 DIAGNOSIS — E876 Hypokalemia: Secondary | ICD-10-CM | POA: Diagnosis present

## 2024-09-22 DIAGNOSIS — L299 Pruritus, unspecified: Secondary | ICD-10-CM | POA: Diagnosis not present

## 2024-09-22 DIAGNOSIS — Z833 Family history of diabetes mellitus: Secondary | ICD-10-CM | POA: Diagnosis not present

## 2024-09-22 DIAGNOSIS — R7401 Elevation of levels of liver transaminase levels: Secondary | ICD-10-CM | POA: Diagnosis present

## 2024-09-22 DIAGNOSIS — Z825 Family history of asthma and other chronic lower respiratory diseases: Secondary | ICD-10-CM | POA: Diagnosis not present

## 2024-09-22 DIAGNOSIS — Z9889 Other specified postprocedural states: Secondary | ICD-10-CM

## 2024-09-22 DIAGNOSIS — A419 Sepsis, unspecified organism: Secondary | ICD-10-CM | POA: Diagnosis present

## 2024-09-22 DIAGNOSIS — Z79899 Other long term (current) drug therapy: Secondary | ICD-10-CM | POA: Diagnosis not present

## 2024-09-22 DIAGNOSIS — B9620 Unspecified Escherichia coli [E. coli] as the cause of diseases classified elsewhere: Secondary | ICD-10-CM | POA: Diagnosis present

## 2024-09-22 DIAGNOSIS — T8144XA Sepsis following a procedure, initial encounter: Secondary | ICD-10-CM | POA: Diagnosis present

## 2024-09-22 DIAGNOSIS — Z79620 Long term (current) use of immunosuppressive biologic: Secondary | ICD-10-CM | POA: Diagnosis not present

## 2024-09-22 DIAGNOSIS — Z6837 Body mass index (BMI) 37.0-37.9, adult: Secondary | ICD-10-CM | POA: Diagnosis not present

## 2024-09-22 DIAGNOSIS — Z8249 Family history of ischemic heart disease and other diseases of the circulatory system: Secondary | ICD-10-CM | POA: Diagnosis not present

## 2024-09-22 DIAGNOSIS — K5090 Crohn's disease, unspecified, without complications: Secondary | ICD-10-CM | POA: Diagnosis present

## 2024-09-22 LAB — URINALYSIS, W/ REFLEX TO CULTURE (INFECTION SUSPECTED)
Bilirubin Urine: NEGATIVE
Glucose, UA: NEGATIVE mg/dL
Ketones, ur: NEGATIVE mg/dL
Nitrite: NEGATIVE
Protein, ur: NEGATIVE mg/dL
Specific Gravity, Urine: >1.06 — ABNORMAL HIGH (ref 1.005–1.030)
pH: 6 (ref 5.0–8.0)

## 2024-09-22 LAB — COMPREHENSIVE METABOLIC PANEL WITH GFR
ALT: 44 U/L (ref 0–44)
AST: 22 U/L (ref 15–41)
Albumin: 2.4 g/dL — ABNORMAL LOW (ref 3.5–5.0)
Alkaline Phosphatase: 84 U/L (ref 38–126)
Anion gap: 7 (ref 5–15)
BUN: 6 mg/dL (ref 6–20)
CO2: 24 mmol/L (ref 22–32)
Calcium: 7.6 mg/dL — ABNORMAL LOW (ref 8.9–10.3)
Chloride: 107 mmol/L (ref 98–111)
Creatinine, Ser: 0.57 mg/dL (ref 0.44–1.00)
GFR, Estimated: >60 mL/min (ref >60)
Glucose, Bld: 100 mg/dL — ABNORMAL HIGH (ref 70–99)
Potassium: 3.1 mmol/L — ABNORMAL LOW (ref 3.5–5.1)
Sodium: 138 mmol/L (ref 135–145)
Total Bilirubin: 1.3 mg/dL — ABNORMAL HIGH (ref 0.0–1.2)
Total Protein: 5.6 g/dL — ABNORMAL LOW (ref 6.5–8.1)

## 2024-09-22 LAB — CBC WITH DIFFERENTIAL/PLATELET
Abs Immature Granulocytes: 0.08 K/uL — ABNORMAL HIGH (ref 0.00–0.07)
Basophils Absolute: 0.1 K/uL (ref 0.0–0.1)
Basophils Relative: 0 %
Eosinophils Absolute: 0.2 K/uL (ref 0.0–0.5)
Eosinophils Relative: 2 %
HCT: 29.5 % — ABNORMAL LOW (ref 36.0–46.0)
Hemoglobin: 9.3 g/dL — ABNORMAL LOW (ref 12.0–15.0)
Immature Granulocytes: 1 %
Lymphocytes Relative: 19 %
Lymphs Abs: 2.7 K/uL (ref 0.7–4.0)
MCH: 25.8 pg — ABNORMAL LOW (ref 26.0–34.0)
MCHC: 31.5 g/dL (ref 30.0–36.0)
MCV: 81.9 fL (ref 80.0–100.0)
Monocytes Absolute: 0.9 K/uL (ref 0.1–1.0)
Monocytes Relative: 6 %
Neutro Abs: 10.6 K/uL — ABNORMAL HIGH (ref 1.7–7.7)
Neutrophils Relative %: 72 %
Platelets: 270 K/uL (ref 150–400)
RBC: 3.6 MIL/uL — ABNORMAL LOW (ref 3.87–5.11)
RDW: 13.4 % (ref 11.5–15.5)
WBC: 14.6 K/uL — ABNORMAL HIGH (ref 4.0–10.5)
nRBC: 0 % (ref 0.0–0.2)

## 2024-09-22 LAB — PHOSPHORUS: Phosphorus: 2.2 mg/dL — ABNORMAL LOW (ref 2.5–4.6)

## 2024-09-22 LAB — MAGNESIUM: Magnesium: 1.4 mg/dL — ABNORMAL LOW (ref 1.7–2.4)

## 2024-09-22 MED ORDER — SODIUM CHLORIDE 0.9 % IV SOLN
2.0000 g | Freq: Three times a day (TID) | INTRAVENOUS | Status: DC
  Administered 2024-09-22 (×2): 2 g via INTRAVENOUS
  Filled 2024-09-22 (×2): qty 12.5

## 2024-09-22 MED ORDER — VANCOMYCIN HCL 1250 MG/250ML IV SOLN
1250.0000 mg | INTRAVENOUS | Status: DC
  Administered 2024-09-22 – 2024-09-23 (×2): 1250 mg via INTRAVENOUS
  Filled 2024-09-22 (×2): qty 250

## 2024-09-22 MED ORDER — DIPHENHYDRAMINE HCL 25 MG PO CAPS
25.0000 mg | ORAL_CAPSULE | Freq: Four times a day (QID) | ORAL | Status: DC | PRN
  Administered 2024-09-23: 25 mg via ORAL
  Filled 2024-09-22: qty 1

## 2024-09-22 MED ORDER — ACETAMINOPHEN 500 MG PO TABS
500.0000 mg | ORAL_TABLET | Freq: Four times a day (QID) | ORAL | Status: DC | PRN
  Administered 2024-09-22: 10:00:00 500 mg via ORAL
  Filled 2024-09-22: qty 1

## 2024-09-22 MED ORDER — POTASSIUM CHLORIDE CRYS ER 20 MEQ PO TBCR
40.0000 meq | EXTENDED_RELEASE_TABLET | ORAL | Status: AC
  Administered 2024-09-22 (×2): 40 meq via ORAL
  Filled 2024-09-22 (×2): qty 2

## 2024-09-22 MED ORDER — LIDOCAINE HCL (PF) 1 % IJ SOLN
5.0000 mL | Freq: Once | INTRAMUSCULAR | Status: AC
  Administered 2024-09-22: 16:00:00 5 mL via INTRADERMAL

## 2024-09-22 MED ORDER — DIPHENHYDRAMINE HCL 50 MG/ML IJ SOLN
25.0000 mg | INTRAMUSCULAR | Status: AC
  Administered 2024-09-22: 01:00:00 25 mg via INTRAVENOUS
  Filled 2024-09-22: qty 1

## 2024-09-22 MED ORDER — POLYETHYLENE GLYCOL 3350 17 G PO PACK: 17.0000 g | PACK | Freq: Every day | ORAL | Status: DC | PRN

## 2024-09-22 MED ORDER — POTASSIUM CHLORIDE CRYS ER 20 MEQ PO TBCR
20.0000 meq | EXTENDED_RELEASE_TABLET | Freq: Two times a day (BID) | ORAL | Status: DC
  Administered 2024-09-22: 03:00:00 20 meq via ORAL
  Filled 2024-09-22: qty 1

## 2024-09-22 MED ORDER — LACTATED RINGERS IV SOLN: INTRAVENOUS | Status: AC

## 2024-09-22 MED ORDER — MAGNESIUM SULFATE 2 GM/50ML IV SOLN
2.0000 g | Freq: Once | INTRAVENOUS | Status: AC
  Administered 2024-09-22: 09:00:00 2 g via INTRAVENOUS
  Filled 2024-09-22: qty 50

## 2024-09-22 MED ORDER — ENOXAPARIN SODIUM 40 MG/0.4ML IJ SOSY
40.0000 mg | PREFILLED_SYRINGE | INTRAMUSCULAR | Status: DC
  Administered 2024-09-23 – 2024-09-24 (×2): 40 mg via SUBCUTANEOUS
  Filled 2024-09-22 (×4): qty 0.4

## 2024-09-22 MED ORDER — OXYCODONE HCL 5 MG PO TABS
5.0000 mg | ORAL_TABLET | Freq: Four times a day (QID) | ORAL | Status: DC | PRN
  Administered 2024-09-22 – 2024-09-24 (×3): 10 mg via ORAL
  Filled 2024-09-22 (×3): qty 2

## 2024-09-22 MED ORDER — SODIUM CHLORIDE 0.9 % IV SOLN: 2.0000 g | Freq: Once | INTRAVENOUS | Status: DC

## 2024-09-22 MED ORDER — DIPHENHYDRAMINE HCL 50 MG/ML IJ SOLN: 25.0000 mg | Freq: Four times a day (QID) | INTRAMUSCULAR | Status: DC | PRN

## 2024-09-22 MED ORDER — POLYETHYLENE GLYCOL 3350 17 G PO PACK
17.0000 g | PACK | Freq: Two times a day (BID) | ORAL | Status: DC
  Administered 2024-09-22 – 2024-09-25 (×3): 17 g via ORAL
  Filled 2024-09-22 (×7): qty 1

## 2024-09-22 MED ORDER — ORAL CARE MOUTH RINSE: 15.0000 mL | OROMUCOSAL | Status: DC | PRN

## 2024-09-22 MED ORDER — POTASSIUM CHLORIDE CRYS ER 20 MEQ PO TBCR: 20.0000 meq | EXTENDED_RELEASE_TABLET | Freq: Three times a day (TID) | ORAL | Status: DC

## 2024-09-22 MED ORDER — MELATONIN 5 MG PO TABS: 5.0000 mg | ORAL_TABLET | Freq: Every evening | ORAL | Status: DC | PRN

## 2024-09-22 MED ORDER — SODIUM CHLORIDE 0.9 % IV SOLN
2.0000 g | INTRAVENOUS | Status: DC
  Administered 2024-09-22 – 2024-09-23 (×2): 2 g via INTRAVENOUS
  Filled 2024-09-22 (×2): qty 20

## 2024-09-22 MED ORDER — PROCHLORPERAZINE EDISYLATE 10 MG/2ML IJ SOLN
5.0000 mg | Freq: Four times a day (QID) | INTRAMUSCULAR | Status: DC | PRN
  Administered 2024-09-22: 09:00:00 5 mg via INTRAVENOUS
  Filled 2024-09-22: qty 2

## 2024-09-22 MED ORDER — MORPHINE SULFATE (PF) 2 MG/ML IV SOLN
2.0000 mg | INTRAVENOUS | Status: DC | PRN
  Administered 2024-09-24: 20:00:00 2 mg via INTRAVENOUS
  Filled 2024-09-22 (×2): qty 1

## 2024-09-22 MED ORDER — MIDAZOLAM HCL 2 MG/2ML IJ SOLN
INTRAMUSCULAR | Status: AC
  Filled 2024-09-22: qty 2

## 2024-09-22 MED ORDER — FENTANYL CITRATE (PF) 100 MCG/2ML IJ SOLN
INTRAMUSCULAR | Status: AC | PRN
  Administered 2024-09-22 (×2): 50 ug via INTRAVENOUS

## 2024-09-22 MED ORDER — VANCOMYCIN HCL 1500 MG/300ML IV SOLN
1500.0000 mg | Freq: Once | INTRAVENOUS | Status: AC
  Administered 2024-09-22: 01:00:00 1500 mg via INTRAVENOUS
  Filled 2024-09-22: qty 300

## 2024-09-22 MED ORDER — KETOROLAC TROMETHAMINE 15 MG/ML IJ SOLN
15.0000 mg | Freq: Four times a day (QID) | INTRAMUSCULAR | Status: DC
  Administered 2024-09-22 – 2024-09-25 (×14): 15 mg via INTRAVENOUS
  Filled 2024-09-22 (×14): qty 1

## 2024-09-22 MED ORDER — FENTANYL CITRATE (PF) 100 MCG/2ML IJ SOLN
INTRAMUSCULAR | Status: AC
  Filled 2024-09-22: qty 2

## 2024-09-22 MED ORDER — MIDAZOLAM HCL (PF) 2 MG/2ML IJ SOLN
INTRAMUSCULAR | Status: AC | PRN
  Administered 2024-09-22: 16:00:00 1 mg via INTRAVENOUS
  Administered 2024-09-22 (×2): .5 mg via INTRAVENOUS

## 2024-09-22 MED ORDER — K PHOS MONO-SOD PHOS DI & MONO 155-852-130 MG PO TABS
500.0000 mg | ORAL_TABLET | Freq: Four times a day (QID) | ORAL | Status: AC
  Administered 2024-09-22 (×3): 500 mg via ORAL
  Filled 2024-09-22 (×5): qty 2

## 2024-09-22 NOTE — ED Notes (Signed)
 Pt ambulated with an even and and steady gate to the rest room.  Breathing is even and unlabored.

## 2024-09-22 NOTE — Progress Notes (Signed)
 PROGRESS NOTE    CARIANNA LAGUE  FMW:[id number] DOB: [birth year] DOA: 09/21/2024 PCP: Carin Gauze, NP  Chief Complaint  Patient presents with   Generalized Body Aches   Chills   Post-op Problem    Brief Narrative:   Sandra Wilkerson is Sandra Wilkerson 28 y.o. female with medical history significant for obesity, symptomatic mammary hypertrophy, who presents to the ER due to bilateral breast pain and swelling.  The patient recently had bilateral breast reduction with Dr. Lowery on 08/21/2024.  She was seen by her plastic surgeon's office Jaquelinne Glendening week ago with concern for bilateral breast swelling, right greater than left.  Associated with brownish drainage from the right breast.  In the plastic surgery's office the patient had her breast massage with moderate amount of brownish fluid drained from the right, consistent with old blood/residual hematoma.  The patient felt relief and the breast felt softer after discharge.  The patient was advised to continue with massage and Xeroform dressings, and to apply Vaseline throughout her incisions, besides Shyam Dawson small wound to the right lateral inframammary incision.   She presented to the ED with R breast swelling and pain associated with chills.  Admitted with concern for post op infection.  Assessment & Plan:   Principal Problem:   Infection of deep incisional surgical site after procedure, initial encounter  Sepsis due to Post Operative Infection Rules in with fever, tachycardia, leukocytosis, tachypnea + concern for post op infection CT with bilateral peripherally enhancing fluid collections in the area of prior surgery UA contaminated with squams Blood cultures pending Continue vancomycin , ceftriaxone  Appreciate plastic surgery consult  Bowel regimen, toradol , oxy prn pain   Anemia Downtrend, likely dilutional  Follow iron , b12, folate, ferritin and trend   Hypokalemia Replace and follow   Hypophosphatemia Replace and follow   Elevated  ALT Mildly Elevated Bilirubin Noted, trend  Pruritus Thought related to vanc - will continue to monitor, has benadryl  prn  Obesity Body mass index is 37 kg/m.    DVT prophylaxis: lovenox  Code Status: full Family Communication: none Disposition:   Status is: Inpatient Remains inpatient appropriate because: need for continued inpatient care   Consultants:  Plastic surgery  Procedures:  none  Antimicrobials:  Anti-infectives (From admission, onward)    Start     Dose/Rate Route Frequency Ordered Stop   09/22/24 2200  vancomycin  (VANCOREADY) IVPB 1250 mg/250 mL        1,250 mg 166.7 mL/hr over 90 Minutes Intravenous Every 24 hours 09/22/24 0030     09/22/24 1400  cefTRIAXone  (ROCEPHIN ) 2 g in sodium chloride  0.9 % 100 mL IVPB        2 g 200 mL/hr over 30 Minutes Intravenous Every 24 hours 09/22/24 0705     09/22/24 0045  vancomycin  (VANCOREADY) IVPB 1500 mg/300 mL        1,500 mg 150 mL/hr over 120 Minutes Intravenous Once 09/22/24 0030 09/22/24 0237   09/22/24 0030  ceFEPIme  (MAXIPIME ) 2 g in sodium chloride  0.9 % 100 mL IVPB  Status:  Discontinued        2 g 200 mL/hr over 30 Minutes Intravenous  Once 09/22/24 0020 09/22/24 0025   09/22/24 0030  ceFEPIme  (MAXIPIME ) 2 g in sodium chloride  0.9 % 100 mL IVPB  Status:  Discontinued        2 g 200 mL/hr over 30 Minutes Intravenous Every 8 hours 09/22/24 0025 09/22/24 0705   09/22/24 0000  vancomycin  (VANCOREADY) IVPB 1500 mg/300 mL  Status:  Discontinued        1,500 mg 150 mL/hr over 120 Minutes Intravenous Every 12 hours 09/21/24 2345 09/22/24 0030   09/21/24 2330  vancomycin  (VANCOCIN ) IVPB 1000 mg/200 mL premix  Status:  Discontinued        1,000 mg 200 mL/hr over 60 Minutes Intravenous  Once 09/21/24 2321 09/21/24 2323   09/21/24 2330  Vancomycin  (VANCOCIN ) 1,500 mg in sodium chloride  0.9 % 500 mL IVPB  Status:  Discontinued        1,500 mg 250 mL/hr over 120 Minutes Intravenous  Once 09/21/24 2323 09/21/24 2345        Subjective: C/o muscle aches, chills  Objective: Vitals:   09/22/24 0530 09/22/24 0615 09/22/24 0630 09/22/24 0645  BP: 106/69 109/89 119/76 120/73  Pulse: 98 97 100 (!) 103  Resp: (!) 28 (!) 35 (!) 23 19  Temp:      TempSrc:      SpO2: 98% 100% 100% 100%  Weight:      Height:        Intake/Output Summary (Last 24 hours) at 09/22/2024 0755 Last data filed at 09/22/2024 0544 Gross per 24 hour  Intake 1492 ml  Output --  Net 1492 ml   Filed Weights   09/21/24 2055  Weight: 83.1 kg    Examination:  General exam: Appears calm and comfortable  Respiratory system: unlabored Cardiovascular system: RRR Breast exam deferred as she's just been seen by plastic surgery No significant TTP to back (notes mild discomfort with palpation of back in thoracic region) - no CVA tenderness Central nervous system: Alert and oriented. No focal neurological deficits. Extremities: no LEE   Data Reviewed: I have personally reviewed following labs and imaging studies  CBC: Recent Labs  Lab 09/21/24 2114 09/22/24 0422  WBC 16.4* 14.6*  NEUTROABS 12.4* 10.6*  HGB 11.5* 9.3*  HCT 35.7* 29.5*  MCV 81.9 81.9  PLT 323 270    Basic Metabolic Panel: Recent Labs  Lab 09/21/24 2114 09/22/24 0422  NA 135 138  K 3.1* 3.1*  CL 100 107  CO2 22 24  GLUCOSE 125* 100*  BUN 9 6  CREATININE 0.79 0.57  CALCIUM 8.9 7.6*  MG  --  1.4*  PHOS  --  2.2*    GFR: Estimated Creatinine Clearance: 96.1 mL/min (by C-G formula based on SCr of 0.57 mg/dL).  Liver Function Tests: Recent Labs  Lab 09/21/24 2114 09/22/24 0422  AST 38 22  ALT 62* 44  ALKPHOS 111 84  BILITOT 1.2 1.3*  PROT 7.7 5.6*  ALBUMIN 3.2* 2.4*    CBG: No results for input(s): GLUCAP in the last 168 hours.   No results found for this or any previous visit (from the past 240 hours).       Radiology Studies: CT Chest W Contrast Result Date: 09/21/2024 EXAM: CT CHEST WITH CONTRAST 09/21/2024 11:06:00  PM TECHNIQUE: CT of the chest was performed with the administration of 75 mL of iohexol  (OMNIPAQUE ) 350 MG/ML injection. Multiplanar reformatted images are provided for review. Automated exposure control, iterative reconstruction, and/or weight based adjustment of the mA/kV was utilized to reduce the radiation dose to as low as reasonably achievable. COMPARISON: 12/15/2023 CLINICAL HISTORY: Sepsis, recent breast reduction, eval for abscess. FINDINGS: MEDIASTINUM: Heart and pericardium are unremarkable. The central airways are clear. The thoracic aorta is within normal limits. 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 The esophagus is within normal limits. LYMPH NODES:  No mediastinal, hilar or axillary lymphadenopathy. LUNGS AND PLEURA: The lungs are well aerated bilaterally. No focal infiltrate or pulmonary edema. No pleural effusion or pneumothorax. No evidence of pulmonary embolism is seen, although the study is not timed for embolus evaluation. SOFT TISSUES/BONES: No acute bony abnormality is noted. There are changes consistent with the given clinical history of bilateral breast reduction. There is Kenika Sahm mildly enhancing fluid collection identified near the inferior aspect of the right breast measuring 7.2 x 5.5 cm. This extends for approximately 11 cm in craniocaudal projection. Mandy Fitzwater similar but smaller fluid collection is noted on the left measuring 4.5 x 3.2 cm and extending for at least 7 cm in craniocaudal projection. UPPER ABDOMEN: Limited images of the upper abdomen demonstrates no acute abnormality. Visualized upper abdomen is unremarkable. IMPRESSION: 1. Bilateral peripherally enhancing fluid collections in the area of prior surgery, right greater than left. Although these may simply represent postoperative seromas, the possibility of developing abscess deserves consideration. Fluid sampling/drainage is recommended. Electronically signed by: Oneil Devonshire MD  09/21/2024 11:15 PM EST RP Workstation: [id number]        Scheduled Meds:  ketorolac   15 mg Intravenous Q6H   phosphorus  500 mg Oral QID   potassium chloride   40 mEq Oral Q4H   Continuous Infusions:  cefTRIAXone  (ROCEPHIN )  IV     lactated ringers  100 mL/hr at 09/22/24 0035   magnesium  sulfate bolus IVPB     vancomycin        LOS: 0 days    Time spent: over 30 min    Meliton Monte, MD [facility]   To contact the attending provider between 7A-7P or the covering provider during after hours 7P-7A, please log into the web site [email or web address] and access using universal  password for that web site. If you do not have the password, please call the hospital operator.  09/22/2024, 7:55 AM

## 2024-09-22 NOTE — ED Notes (Signed)
Walk patient to the bathroom patient did well 

## 2024-09-22 NOTE — ED Notes (Signed)
 Patient is back in bed on the monitor with family at bedside

## 2024-09-22 NOTE — Consult Note (Addendum)
 Reason for Consult: Postoperative infection Referring Physician: Dr. Pollina  Sandra Wilkerson is an 28 y.o. female.  HPI: Patient is a pleasant 28 year old female s/p bilateral breast reduction with liposuction and free nipple graft performed 08/21/2024 Dr. Lowery who was admitted to the hospital for postop infection.  She presented to the ED late last evening for increased breast swelling, pain, and fever.  She was noted to be tachycardic and febrile with white count elevated at 16.4.  Sepsis protocol initiated.  CT obtained revealed peripherally enhancing fluid collections in bilateral breasts, R > L.    On my examination today, patient states that a moderate amount of darkish fluid was expressed from her right breast during a postoperative visit after which she felt better.  However, over the weekend she noted increased swelling/discomfort prior to the onset of fever.  She denies any shortness of breath, chest pain, difficulty breathing, changes in urine/bowel movements, or leg swelling.  She is ambulatory at home, tolerating p.o. intake without difficulty, and has been wearing her compressive garment.  Denies any sick contacts.  She also states that the free nipple grafts are going nicely and is starting to have melanin return to her left areola.  Past Medical History:  Diagnosis Date   Anemia    Crohn disease (HCC)    PCOS (polycystic ovarian syndrome)     Past Surgical History:  Procedure Laterality Date   APPENDECTOMY     BREAST REDUCTION SURGERY Bilateral 08/21/2024   Procedure: BREAST REDUCTION WITH LIPOSUCTION;  Surgeon: Lowery Estefana RAMAN, DO;  Location: [facility];  Service: Plastics;  Laterality: Bilateral;   CESAREAN SECTION  07/15/2021   Procedure: CESAREAN SECTION;  Surgeon: Janit Alm Agent, MD;  Location: [facility];  Service: Obstetrics;;   CESAREAN SECTION N/A 05/07/2023   Procedure: REPEAT CESAREAN DELIVERY;  Surgeon: Janit Alm Agent, MD;   Location: [facility];  Service: Obstetrics;  Laterality: N/A;   CHOLECYSTECTOMY     COLECTOMY     COLOSTOMY     COLOSTOMY REVERSAL     ERCP N/A 11/07/2021   Procedure: ENDOSCOPIC RETROGRADE CHOLANGIOPANCREATOGRAPHY (ERCP);  Surgeon: Jinny Carmine, MD;  Location: [facility];  Service: Endoscopy;  Laterality: N/A;    Family History  Problem Relation Age of Onset   Healthy Mother    Asthma Father    Healthy Sister    Healthy Sister    Healthy Sister    Healthy Sister    Healthy Sister    Diabetes Maternal Grandmother    Alzheimer's disease Maternal Grandmother    Healthy Maternal Grandfather    Congestive Heart Failure Paternal Grandmother     Social History:  reports that she has never smoked. She has never used smokeless tobacco. She reports that she does not drink alcohol and does not use drugs.  Allergies: Allergies[1]  Medications: I have reviewed the patient's current medications.  Results for orders placed or performed during the hospital encounter of 09/21/24 (from the past 48 hours)  Comprehensive metabolic panel     Status: Abnormal   Collection Time: 09/21/24  9:14 PM  Result Value Ref Range   Sodium 135 135 - 145 mmol/L   Potassium 3.1 (L) 3.5 - 5.1 mmol/L   Chloride 100 98 - 111 mmol/L   CO2 22 22 - 32 mmol/L   Glucose, Bld 125 (H) 70 - 99 mg/dL    Comment: Glucose reference range applies only to samples taken after fasting for at least 8 hours.  BUN 9 6 - 20 mg/dL   Creatinine, Ser 9.20 0.44 - 1.00 mg/dL   Calcium 8.9 8.9 - 89.6 mg/dL   Total Protein 7.7 6.5 - 8.1 g/dL   Albumin 3.2 (L) 3.5 - 5.0 g/dL   AST 38 15 - 41 U/L   ALT 62 (H) 0 - 44 U/L   Alkaline Phosphatase 111 38 - 126 U/L   Total Bilirubin 1.2 0.0 - 1.2 mg/dL   GFR, Estimated >39 >39 mL/min    Comment: (NOTE) Calculated using the CKD-EPI Creatinine Equation (2021)    Anion gap 13 5 - 15    Comment: Performed at [facility], [address], [place], [place] [address]  CBC with  Differential     Status: Abnormal   Collection Time: 09/21/24  9:14 PM  Result Value Ref Range   WBC 16.4 (H) 4.0 - 10.5 K/uL   RBC 4.36 3.87 - 5.11 MIL/uL   Hemoglobin 11.5 (L) 12.0 - 15.0 g/dL   HCT 64.2 (L) 63.9 - 53.9 %   MCV 81.9 80.0 - 100.0 fL   MCH 26.4 26.0 - 34.0 pg   MCHC 32.2 30.0 - 36.0 g/dL   RDW 86.4 88.4 - 84.4 %   Platelets 323 150 - 400 K/uL   nRBC 0.0 0.0 - 0.2 %   Neutrophils Relative % 76 %   Neutro Abs 12.4 (H) 1.7 - 7.7 K/uL   Lymphocytes Relative 17 %   Lymphs Abs 2.8 0.7 - 4.0 K/uL   Monocytes Relative 5 %   Monocytes Absolute 0.9 0.1 - 1.0 K/uL   Eosinophils Relative 1 %   Eosinophils Absolute 0.2 0.0 - 0.5 K/uL   Basophils Relative 0 %   Basophils Absolute 0.1 0.0 - 0.1 K/uL   Immature Granulocytes 1 %   Abs Immature Granulocytes 0.08 (H) 0.00 - 0.07 K/uL    Comment: Performed at [facility], [address] [place], [place] [address]  Protime-INR     Status: Abnormal   Collection Time: 09/21/24  9:14 PM  Result Value Ref Range   Prothrombin Time 15.3 (H) 11.4 - 15.2 seconds   INR 1.1 0.8 - 1.2    Comment: (NOTE) INR goal varies based on device and disease states. Performed at [facility], [address] [place], [place] [address]   hCG, serum, qualitative     Status: None   Collection Time: 09/21/24  9:14 PM  Result Value Ref Range   Preg, Serum NEGATIVE NEGATIVE    Comment:        THE SENSITIVITY OF THIS METHODOLOGY IS >10 mIU/mL. Performed at [facility], [address], [place], [place] [address]   I-Stat Lactic Acid, ED     Status: None   Collection Time: 09/21/24  9:16 PM  Result Value Ref Range   Lactic Acid, Venous 1.8 0.5 - 1.9 mmol/L  Urinalysis, w/ Reflex to Culture (Infection Suspected) -Urine, Clean Catch     Status: Abnormal   Collection Time: 09/21/24 11:50 PM  Result Value Ref Range   Specimen Source URINE, CATHETERIZED    Color, Urine YELLOW YELLOW   APPearance HAZY (A) CLEAR   Specific Gravity,  Urine >1.060 (H) 1.005 - 1.030   pH 6.0 5.0 - 8.0   Glucose, UA NEGATIVE NEGATIVE mg/dL   Hgb urine dipstick SMALL (A) NEGATIVE   Bilirubin Urine NEGATIVE NEGATIVE   Ketones, ur NEGATIVE NEGATIVE mg/dL   Protein, ur NEGATIVE  NEGATIVE mg/dL   Nitrite NEGATIVE NEGATIVE   Leukocytes,Ua MODERATE (A) NEGATIVE   RBC / HPF 0-5 0 - 5 RBC/hpf   WBC, UA 0-5 0 - 5 WBC/hpf    Comment:        Reflex urine culture not performed if WBC <=10, OR if Squamous epithelial cells >5. If Squamous epithelial cells >5 suggest recollection.    Bacteria, UA RARE (A) NONE SEEN   Squamous Epithelial / HPF 6-10 0 - 5 /HPF   Mucus PRESENT     Comment: Performed at [facility], [address] [place], [place] [address]  CBC with Differential/Platelet     Status: Abnormal   Collection Time: 09/22/24  4:22 AM  Result Value Ref Range   WBC 14.6 (H) 4.0 - 10.5 K/uL   RBC 3.60 (L) 3.87 - 5.11 MIL/uL   Hemoglobin 9.3 (L) 12.0 - 15.0 g/dL   HCT 70.4 (L) 63.9 - 53.9 %   MCV 81.9 80.0 - 100.0 fL   MCH 25.8 (L) 26.0 - 34.0 pg   MCHC 31.5 30.0 - 36.0 g/dL   RDW 86.5 88.4 - 84.4 %   Platelets 270 150 - 400 K/uL   nRBC 0.0 0.0 - 0.2 %   Neutrophils Relative % 72 %   Neutro Abs 10.6 (H) 1.7 - 7.7 K/uL   Lymphocytes Relative 19 %   Lymphs Abs 2.7 0.7 - 4.0 K/uL   Monocytes Relative 6 %   Monocytes Absolute 0.9 0.1 - 1.0 K/uL   Eosinophils Relative 2 %   Eosinophils Absolute 0.2 0.0 - 0.5 K/uL   Basophils Relative 0 %   Basophils Absolute 0.1 0.0 - 0.1 K/uL   Immature Granulocytes 1 %   Abs Immature Granulocytes 0.08 (H) 0.00 - 0.07 K/uL    Comment: Performed at [facility], [address], [place], [place] [address]  Comprehensive metabolic panel     Status: Abnormal   Collection Time: 09/22/24  4:22 AM  Result Value Ref Range   Sodium 138 135 - 145 mmol/L   Potassium 3.1 (L) 3.5 - 5.1 mmol/L   Chloride 107 98 - 111 mmol/L   CO2 24 22 - 32 mmol/L   Glucose, Bld 100 (H) 70 - 99 mg/dL     Comment: Glucose reference range applies only to samples taken after fasting for at least 8 hours.   BUN 6 6 - 20 mg/dL   Creatinine, Ser 9.42 0.44 - 1.00 mg/dL   Calcium 7.6 (L) 8.9 - 10.3 mg/dL   Total Protein 5.6 (L) 6.5 - 8.1 g/dL   Albumin 2.4 (L) 3.5 - 5.0 g/dL   AST 22 15 - 41 U/L   ALT 44 0 - 44 U/L   Alkaline Phosphatase 84 38 - 126 U/L   Total Bilirubin 1.3 (H) 0.0 - 1.2 mg/dL   GFR, Estimated >39 >39 mL/min    Comment: (NOTE) Calculated using the CKD-EPI Creatinine Equation (2021)    Anion gap 7 5 - 15    Comment: Performed at [facility], [address], [place], [place] [address]  Magnesium      Status: Abnormal   Collection Time: 09/22/24  4:22 AM  Result Value Ref Range   Magnesium  1.4 (L) 1.7 - 2.4 mg/dL    Comment: Performed at [facility], [address] [place], [place] [address]  Phosphorus     Status: Abnormal   Collection Time: 09/22/24  4:22 AM  Result Value Ref Range   Phosphorus 2.2 (L) 2.5 - 4.6 mg/dL    Comment: Performed at [facility], [address] [place], [place] [address]    CT Chest W Contrast Result Date: 09/21/2024 EXAM: CT CHEST WITH CONTRAST 09/21/2024 11:06:00 PM TECHNIQUE: CT of the chest was performed with the administration of 75 mL of iohexol  (OMNIPAQUE ) 350 MG/ML injection. Multiplanar reformatted images are provided for review. Automated exposure control, iterative reconstruction, and/or weight based adjustment of the mA/kV was utilized to reduce the radiation dose to as low as reasonably achievable. COMPARISON: 12/15/2023 CLINICAL HISTORY: Sepsis, recent breast reduction, eval for abscess. FINDINGS: MEDIASTINUM: Heart and pericardium are unremarkable. The central airways are clear. The thoracic aorta is within normal limits. 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 The esophagus is within normal limits. LYMPH NODES: No mediastinal, hilar or axillary lymphadenopathy.  LUNGS AND PLEURA: The lungs are well aerated bilaterally. No focal infiltrate or pulmonary edema. No pleural effusion or pneumothorax. No evidence of pulmonary embolism is seen, although the study is not timed for embolus evaluation. SOFT TISSUES/BONES: No acute bony abnormality is noted. There are changes consistent with the given clinical history of bilateral breast reduction. There is a mildly enhancing fluid collection identified near the inferior aspect of the right breast measuring 7.2 x 5.5 cm. This extends for approximately 11 cm in craniocaudal projection. A similar but smaller fluid collection is noted on the left measuring 4.5 x 3.2 cm and extending for at least 7 cm in craniocaudal projection. UPPER ABDOMEN: Limited images of the upper abdomen demonstrates no acute abnormality. Visualized upper abdomen is unremarkable. IMPRESSION: 1. Bilateral peripherally enhancing fluid collections in the area of prior surgery, right greater than left. Although these may simply represent postoperative seromas, the possibility of developing abscess deserves consideration. Fluid sampling/drainage is recommended. Electronically signed by: Oneil Devonshire MD 09/21/2024 11:15 PM EST RP Workstation: [id number]    ROS Endorses bilateral breast discomfort laterally, R > L.   Endorses fever at home.   Blood pressure 120/73, pulse (!) 103, temperature 98.9 F (37.2 C), temperature source Oral, resp. rate 19, height 4' 11 (1.499 m), weight 83.1 kg, SpO2 100%, not currently breastfeeding. Physical Exam Constitutional:      Appearance: Normal appearance. She is not toxic-appearing.  Cardiovascular:     Rate and Rhythm: Tachycardia present.  Pulmonary:     Effort: Pulmonary effort is normal.  Skin:    Comments: Breasts: Discrete areas of firmness in inferior-lateral aspects of breast bilaterally, right larger than left.  Mildly TTP.  No overlying erythema or blanching.  No fluctuance or crepitus.    Neurological:      Mental Status: She is alert and oriented to person, place, and time.     Assessment/Plan:  Recent breast reduction surgery complicated by postoperative infection, - Patient's discrete areas of firmness feel more like fat necrosis from liposuction performed out laterally.  However, given fluid collections on CT, suspect possible infected seroma as a cause for her fever and leukocytosis.  Difficult to palpate the fluid on exam.   - Repeat CBC shows downtrending leukocytosis from 16.4 to 14.6. - Discussed case with Dr. Lowery.  She will reevaluate patient later today.  At this time, no plans for IR guided drain placement or surgical intervention.  Continued medical management with IV antibiotics. - Appreciate the assistance from primary team.  Sandra Wilkerson 09/22/2024, 7:27 AM         [  1]  Allergies Allergen Reactions   Infliximab  Anaphylaxis    REMICADE :  chest pressure, itching, and cough   Other Anaphylaxis    Applies to iron  infusions only. Patient can use oral iron  supplements.

## 2024-09-22 NOTE — Telephone Encounter (Signed)
 Patient is a 28 year old female s/p breast reduction performed 08/21/2024 by Dr. Lowery.  She presented to the ED late last evening for fever and right breast pain. Patient met SIRS criteria and sepsis workup was initiated.  There were fluid collections reflective of seroma/abscess on CT and induration noted on exam.  Received phone call from Dr. Haze with the [facility] ED who recommended admission to hospitalist team for IV antibiotics.   Patient is stable and will plan to round on the patient in the morning.

## 2024-09-22 NOTE — ED Notes (Signed)
 Breathing is even and unlabored.  Support person at bedside.

## 2024-09-22 NOTE — ED Notes (Signed)
 Floor notified pt is on the way up.

## 2024-09-22 NOTE — ED Notes (Signed)
 Breathing is even and unlabored. Call light within reach, encouraged to use when needs arise.

## 2024-09-22 NOTE — Procedures (Signed)
 Pre procedural Dx: Right breast post op abscess Post procedural Dx: Same  Technically successful US  guided aspiration of the right breast yielding redish brown material appearing to be infected hematoma.    A representative aspirated sample was capped and sent to the laboratory for analysis.    EBL: Trace Complications: None immediate  KANDICE Banner, MD Pager #: [phone]

## 2024-09-22 NOTE — Consult Note (Signed)
 Chief Complaint: Right breast aspiration. Right breast abscess  Referring Physician(s): Dr. Meliton Monte  Supervising Physician: Luverne Aran  Patient Status: [facility] - ED  History of Present Illness: Sandra Wilkerson is a 28 y.o. female  outpatient. History of Crohns and amenia. s/p bilateral breast reduction on 11.13.25. Presented to the ED at [facility] on 12.14.25 feverish with body aches and chills. She states that she is having pain around the right lateral incisions of the right breast with a small amount of brownish drainage. CT chest with contrast reads ilateral peripherally enhancing fluid collections in the area of prior surgery, right greater than left. Although these may simply represent postoperative seromas, the possibility of developing abscess deserves consideration. Fluid sampling/drainage is recommended. Team is requesting a right breast aspiration.   Patient alert and laying in bed,calm. Endorses right breast tenderness. Denies any fevers, headache, chest pain, SOB, cough, abdominal pain, nausea, vomiting or bleeding.   WBC 14.6. albumin 2.4 potassium 3.1. Patient refused lovenox  today. All other labs and medications are within acceptable parameters. No pertinent allergies. Patient has been NPO since midnight.   Return precautions and treatment recommendations and follow-up discussed with the patient and family  who is agreeable with the plan.    Past Medical History:  Diagnosis Date   Anemia    Crohn disease (HCC)    PCOS (polycystic ovarian syndrome)     Past Surgical History:  Procedure Laterality Date   APPENDECTOMY     BREAST REDUCTION SURGERY Bilateral 08/21/2024   Procedure: BREAST REDUCTION WITH LIPOSUCTION;  Surgeon: Lowery Estefana RAMAN, DO;  Location: [facility];  Service: Plastics;  Laterality: Bilateral;   CESAREAN SECTION  07/15/2021   Procedure: CESAREAN SECTION;  Surgeon: Janit Alm Agent, MD;  Location: [facility] ORS;  Service:  Obstetrics;;   CESAREAN SECTION N/A 05/07/2023   Procedure: REPEAT CESAREAN DELIVERY;  Surgeon: Janit Alm Agent, MD;  Location: [facility] ORS;  Service: Obstetrics;  Laterality: N/A;   CHOLECYSTECTOMY     COLECTOMY     COLOSTOMY     COLOSTOMY REVERSAL     ERCP N/A 11/07/2021   Procedure: ENDOSCOPIC RETROGRADE CHOLANGIOPANCREATOGRAPHY (ERCP);  Surgeon: Jinny Carmine, MD;  Location: [facility];  Service: Endoscopy;  Laterality: N/A;    Allergies: Infliximab  and Other  Medications: Prior to Admission medications  Medication Sig Start Date End Date Taking? Authorizing Provider  acetaminophen  (TYLENOL ) 500 MG tablet Take 1,000 mg by mouth every 6 (six) hours as needed for mild pain (pain score 1-3) or moderate pain (pain score 4-6).   Yes [provider]  EPINEPHrine  (EPIPEN  2-PAK) 0.3 mg/0.3 mL IJ SOAJ injection Inject 0.3 mg into the muscle as needed for anaphylaxis. 12/16/23  Yes Patel, Sona, MD  folic acid (FOLVITE) 1 MG tablet Take 1 mg by mouth daily. 06/17/24 06/17/25 Yes [provider]  ondansetron  (ZOFRAN ) 4 MG tablet Take 1 tablet (4 mg total) by mouth every 8 (eight) hours as needed for up to 15 doses for nausea or vomiting. 08/04/24  Yes Andris Estefana BRAVO, PA-C  Risankizumab-rzaa (SKYRIZI) 360 MG/2.4ML SOCT Inject 360 mg into the skin. 03/18/24  Yes [provider]  sulfaSALAzine (AZULFIDINE) 500 MG tablet Take 1,000 mg by mouth 2 (two) times daily. 07/14/24  Yes [provider]     Family History  Problem Relation Age of Onset   Healthy Mother    Asthma Father    Healthy Sister    Healthy Sister    Healthy Sister  Healthy Sister    Healthy Sister    Diabetes Maternal Grandmother    Alzheimer's disease Maternal Grandmother    Healthy Maternal Grandfather    Congestive Heart Failure Paternal Grandmother     Social History   Socioeconomic History   Marital status: Significant Other    Spouse name: Darold   Number of children: 1    Years of education: 16   Highest education level: Not on file  Occupational History   Occupation: childcare  Tobacco Use   Smoking status: Never   Smokeless tobacco: Never  Vaping Use   Vaping status: Never Used  Substance and Sexual Activity   Alcohol use: No   Drug use: No   Sexual activity: Yes    Partners: Male    Birth control/protection: None  Other Topics Concern   Not on file  Social History Narrative   Not on file   Social Drivers of Health   Tobacco Use: Low Risk (09/21/2024)   Patient History    Smoking Tobacco Use: Never    Smokeless Tobacco Use: Never    Passive Exposure: Not on file  Financial Resource Strain: Low Risk (10/05/2022)   Overall Financial Resource Strain (CARDIA)    Difficulty of Paying Living Expenses: Not very hard  Food Insecurity: No Food Insecurity (09/22/2024)   Epic    Worried About Radiation Protection Practitioner of Food in the Last Year: Never true    Ran Out of Food in the Last Year: Never true  Transportation Needs: No Transportation Needs (09/22/2024)   Epic    Lack of Transportation (Medical): No    Lack of Transportation (Non-Medical): No  Physical Activity: Insufficiently Active (10/05/2022)   Exercise Vital Sign    Days of Exercise per Week: 1 day    Minutes of Exercise per Session: 10 min  Stress: No Stress Concern Present (10/05/2022)   [employer] of Occupational Health - Occupational Stress Questionnaire    Feeling of Stress : Not at all  Social Connections: Moderately Integrated (10/05/2022)   Social Connection and Isolation Panel    Frequency of Communication with Friends and Family: More than three times a week    Frequency of Social Gatherings with Friends and Family: Three times a week    Attends Religious Services: More than 4 times per year    Active Member of Clubs or Organizations: No    Attends Banker Meetings: Never    Marital Status: Living with partner  Depression (PHQ2-9): Not on file  Alcohol Screen:  Not on file  Housing: Low Risk (09/22/2024)   Epic    Unable to Pay for Housing in the Last Year: No    Number of Times Moved in the Last Year: 0    Homeless in the Last Year: No  Utilities: Not At Risk (09/22/2024)   Epic    Threatened with loss of utilities: No  Health Literacy: Not on file      Review of Systems: A 12 point ROS discussed and pertinent positives are indicated in the HPI above.  All other systems are negative.  Review of Systems  Constitutional:  Negative for fatigue and fever.  HENT:  Negative for congestion.   Respiratory:  Negative for cough and shortness of breath.   Gastrointestinal:  Negative for abdominal pain, diarrhea, nausea and vomiting.  Musculoskeletal:  Positive for myalgias (right breast tenderness with drainage).    Vital Signs: BP 120/76   Pulse (!) 104   Temp 98.9  F (37.2 C) (Oral)   Resp 20   Ht 4' 11 (1.499 m)   Wt 183 lb 3.2 oz (83.1 kg)   SpO2 100%   BMI 37.00 kg/m     Physical Exam Vitals and nursing note reviewed.  Constitutional:      Appearance: She is well-developed. She is obese.  HENT:     Head: Normocephalic and atraumatic.  Eyes:     Conjunctiva/sclera: Conjunctivae normal.  Cardiovascular:     Rate and Rhythm: Regular rhythm.  Pulmonary:     Effort: Pulmonary effort is normal.  Musculoskeletal:        General: Normal range of motion.     Cervical back: Normal range of motion.  Skin:    General: Skin is warm.  Neurological:     General: No focal deficit present.     Mental Status: She is alert and oriented to person, place, and time. Mental status is at baseline.  Psychiatric:        Mood and Affect: Mood normal.        Behavior: Behavior normal.        Thought Content: Thought content normal.        Judgment: Judgment normal.     Imaging: CT Chest W Contrast Result Date: 09/21/2024 EXAM: CT CHEST WITH CONTRAST 09/21/2024 11:06:00 PM TECHNIQUE: CT of the chest was performed with the administration  of 75 mL of iohexol  (OMNIPAQUE ) 350 MG/ML injection. Multiplanar reformatted images are provided for review. Automated exposure control, iterative reconstruction, and/or weight based adjustment of the mA/kV was utilized to reduce the radiation dose to as low as reasonably achievable. COMPARISON: 12/15/2023 CLINICAL HISTORY: Sepsis, recent breast reduction, eval for abscess. FINDINGS: MEDIASTINUM: Heart and pericardium are unremarkable. The central airways are clear. The thoracic aorta is within normal limits. 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 The esophagus is within normal limits. LYMPH NODES: No mediastinal, hilar or axillary lymphadenopathy. LUNGS AND PLEURA: The lungs are well aerated bilaterally. No focal infiltrate or pulmonary edema. No pleural effusion or pneumothorax. No evidence of pulmonary embolism is seen, although the study is not timed for embolus evaluation. SOFT TISSUES/BONES: No acute bony abnormality is noted. There are changes consistent with the given clinical history of bilateral breast reduction. There is a mildly enhancing fluid collection identified near the inferior aspect of the right breast measuring 7.2 x 5.5 cm. This extends for approximately 11 cm in craniocaudal projection. A similar but smaller fluid collection is noted on the left measuring 4.5 x 3.2 cm and extending for at least 7 cm in craniocaudal projection. UPPER ABDOMEN: Limited images of the upper abdomen demonstrates no acute abnormality. Visualized upper abdomen is unremarkable. IMPRESSION: 1. Bilateral peripherally enhancing fluid collections in the area of prior surgery, right greater than left. Although these may simply represent postoperative seromas, the possibility of developing abscess deserves consideration. Fluid sampling/drainage is recommended. Electronically signed by: Oneil Devonshire MD 09/21/2024 11:15 PM EST RP Workstation: [id number]     Labs:  CBC: Recent Labs    12/15/23 1023 12/15/23 1457 12/15/23 2025 12/16/23 0818 09/21/24 2114 09/22/24 0422  WBC 15.7*  --   --  15.3* 16.4* 14.6*  HGB 13.0   < > 13.5 12.8 11.5* 9.3*  HCT 39.6   < > 40.7 39.0 35.7* 29.5*  PLT 313  --   --  331 323 270   < > = values in this interval not displayed.  COAGS: Recent Labs    12/15/23 1111 09/21/24 2114  INR 1.1 1.1  APTT 27  --     BMP: Recent Labs    12/15/23 1023 12/16/23 0818 09/21/24 2114 09/22/24 0422  NA 135 136 135 138  K 3.3* 3.8 3.1* 3.1*  CL 108 107 100 107  CO2 22 23 22 24   GLUCOSE 110* 109* 125* 100*  BUN 18 12 9 6   CALCIUM 8.1* 8.3* 8.9 7.6*  CREATININE 0.68 0.53 0.79 0.57  GFRNONAA >60 >60 >60 >60    LIVER FUNCTION TESTS: Recent Labs    12/15/23 1023 09/21/24 2114 09/22/24 0422  BILITOT 0.2 1.2 1.3*  AST 34 38 22  ALT 32 62* 44  ALKPHOS 60 111 84  PROT 7.2 7.7 5.6*  ALBUMIN 3.3* 3.2* 2.4*    TUMOR MARKERS: No results for input(s): AFPTM, CEA, CA199, CHROMGRNA in the last 8760 hours.  Assessment and Plan: 42 y.. female outpatient. History of Crohns and amenia. s/p bilateral breast reduction on 11.13.25. Presented to the ED at [facility] on 12.14.25 feverish with body aches and chills. She states that she is having pain around the right lateral incisions of the right breast with a small amount of brownish drainage. CT chest with contrast reads ilateral peripherally enhancing fluid collections in the area of prior surgery, right greater than left. Although these may simply represent postoperative seromas, the possibility of developing abscess deserves consideration. Fluid sampling/drainage is recommended. Team is requesting a right breast aspiration.   PLAN: IR Image Guided Right Breast Aspiration  Risks and benefits discussed with the patient including bleeding, infection, damage to adjacent structures, bowel perforation/fistula connection, and sepsis.  All of the patient's  questions were answered, patient is agreeable to proceed. Consent signed and in chart.   Thank you for this interesting consult.  I greatly enjoyed meeting Sandra Wilkerson and look forward to participating in their care.  A copy of this report was sent to the requesting provider on this date.  Electronically Signed: Delon JAYSON Beagle, NP 09/22/2024, 3:18 PM   I spent a total of 40 Minutes    in face to face in clinical consultation, greater than 50% of which was counseling/coordinating care for right breast abscess aspiration

## 2024-09-22 NOTE — H&P (Addendum)
 History and Physical  Sandra Wilkerson FMW:[id number] DOB: [birth year] DOA: 09/21/2024  Referring physician: Dr. Haze, EDP  PCP: Carin Gauze, NP  Outpatient Specialists: Plastic surgery. Patient coming from: Home.  Chief Complaint: Right breast pain and swelling.  HPI: Sandra Wilkerson is a 28 y.o. female with medical history significant for obesity, symptomatic mammary hypertrophy, who presents to the ER due to bilateral breast pain and swelling.  The patient recently had bilateral breast reduction with Dr. Lowery on 08/21/2024.  She was seen by her plastic surgeon's office a week ago with concern for bilateral breast swelling, right greater than left.  Associated with brownish drainage from the right breast.  In the plastic surgery's office the patient had her breast massage with moderate amount of brownish fluid drained from the right, consistent with old blood/residual hematoma.  The patient felt relief and the breast felt softer after discharge.  The patient was advised to continue with massage and Xeroform dressings, and to apply Vaseline throughout her incisions, besides a small wound to the right lateral inframammary incision.  Today, the patient presents to the ER with complaints of right breast swelling and pain.  Associated with chills.  Tachycardic in the ER.  Leukocytosis, with neutrophilia 12.4, and bandemia.  CT chest with contrast revealed bilateral peripherally enhancing fluid collections in the area of prior surgery, right greater than left.  Although this may simply represent postoperative seromas the possibility of developing abscess deserves consideration.  Fluid sampling and drainage is recommended.  IV vancomycin  and cefepime  was initiated in the ER.  Admitted by [facility], hospitalist service.  ED Course: Temperature 98.7.  BP 109/73, pulse 103, respiratory 16, oxygen saturation 100% on room air.  Review of Systems: Review of systems as noted in the HPI. All other  systems reviewed and are negative.   Past Medical History:  Diagnosis Date   Anemia    Crohn disease (HCC)    PCOS (polycystic ovarian syndrome)    Past Surgical History:  Procedure Laterality Date   APPENDECTOMY     BREAST REDUCTION SURGERY Bilateral 08/21/2024   Procedure: BREAST REDUCTION WITH LIPOSUCTION;  Surgeon: Lowery Estefana RAMAN, DO;  Location: [facility];  Service: Plastics;  Laterality: Bilateral;   CESAREAN SECTION  07/15/2021   Procedure: CESAREAN SECTION;  Surgeon: Janit Alm Agent, MD;  Location: [facility] ORS;  Service: Obstetrics;;   CESAREAN SECTION N/A 05/07/2023   Procedure: REPEAT CESAREAN DELIVERY;  Surgeon: Janit Alm Agent, MD;  Location: [facility] OR[facility];  Service: Obstetrics;  Laterality: N/A;   CHOLECYSTECTOMY     COLECTOMY     COLOSTOMY     COLOSTOMY REVERSAL     ERCP N/A 11/07/2021   Procedure: ENDOSCOPIC RETROGRADE CHOLANGIOPANCREATOGRAPHY (ERCP);  Surgeon: Jinny Carmine, MD;  Location: [facility];  Service: Endoscopy;  Laterality: N/A;    Social History:  reports that she has never smoked. She has never used smokeless tobacco. She reports that she does not drink alcohol and does not use drugs.   Allergies[1]  Family History  Problem Relation Age of Onset   Healthy Mother    Asthma Father    Healthy Sister    Healthy Sister    Healthy Sister    Healthy Sister    Healthy Sister    Diabetes Maternal Grandmother    Alzheimer's disease Maternal Grandmother    Healthy Maternal Grandfather    Congestive Heart Failure Paternal Grandmother       Prior to Admission medications  Medication Sig  Start Date End Date Taking? Authorizing Provider  colestipol  (COLESTID ) 1 g tablet Take 2 g by mouth 2 (two) times daily. At least 1 hour separate from other medications Patient not taking: Reported on 09/15/2024    [provider]  EPINEPHrine  (EPIPEN  2-PAK) 0.3 mg/0.3 mL IJ SOAJ injection Inject 0.3 mg into the muscle as needed for  anaphylaxis. 12/16/23   Patel, Sona, MD  fluticasone  (FLONASE ) 50 MCG/ACT nasal spray Place 1 spray into both nostrils daily. Patient not taking: Reported on 09/15/2024 10/12/23 10/11/24  Scoggins, [facility], NP  ondansetron  (ZOFRAN ) 4 MG tablet Take 1 tablet (4 mg total) by mouth every 8 (eight) hours as needed for up to 15 doses for nausea or vomiting. 08/04/24   Andris Estefana BRAVO, PA-C  Risankizumab-rzaa (SKYRIZI) 360 MG/2.4ML SOCT Inject 360 mg into the skin. 03/18/24   [provider]  traMADol  (ULTRAM ) 50 MG tablet Take 1 tablet (50 mg total) by mouth every 8 (eight) hours as needed for up to 8 doses for moderate pain (pain score 4-6) or severe pain (pain score 7-10). 08/27/24   Andris Estefana BRAVO, PA-C    Physical Exam: BP 118/74 (BP Location: Right Arm)   Pulse (!) 103   Temp 98.7 F (37.1 C) (Oral)   Resp 16   Ht 4' 11 (1.499 m)   Wt 83.1 kg   SpO2 100%   BMI 37.00 kg/m   General: 28 y.o. year-old female well developed well nourished in no acute distress.  Alert and oriented x3. Cardiovascular: Tachycardic with no rubs or gallops.  No thyromegaly or JVD noted.  No lower extremity edema. 2/4 pulses in all 4 extremities. Respiratory: Clear to auscultation with no wheezes or rales. Good inspiratory effort. Abdomen: Soft nontender nondistended with normal bowel sounds x4 quadrants. Muskuloskeletal: No cyanosis, clubbing or edema noted bilaterally.  B/L breast moderately edematous and tender to the touch.  No significant erythema. Neuro: CN II-XII intact, strength, sensation, reflexes Skin: No ulcerative lesions noted or rashes Psychiatry: Judgement and insight appear normal. Mood is appropriate for condition and setting          Labs on Admission:  Basic Metabolic Panel: Recent Labs  Lab 09/21/24 2114  NA 135  K 3.1*  CL 100  CO2 22  GLUCOSE 125*  BUN 9  CREATININE 0.79  CALCIUM 8.9   Liver Function Tests: Recent Labs  Lab 09/21/24 2114  AST 38  ALT 62*  ALKPHOS  111  BILITOT 1.2  PROT 7.7  ALBUMIN 3.2*   No results for input(s): LIPASE, AMYLASE in the last 168 hours. No results for input(s): AMMONIA  in the last 168 hours. CBC: Recent Labs  Lab 09/21/24 2114  WBC 16.4*  NEUTROABS 12.4*  HGB 11.5*  HCT 35.7*  MCV 81.9  PLT 323   Cardiac Enzymes: No results for input(s): CKTOTAL, CKMB, CKMBINDEX, TROPONINI in the last 168 hours.  BNP (last 3 results) Recent Labs    12/15/23 1023  BNP 18.2    ProBNP (last 3 results) No results for input(s): PROBNP in the last 8760 hours.  CBG: No results for input(s): GLUCAP in the last 168 hours.  Radiological Exams on Admission: CT Chest W Contrast Result Date: 09/21/2024 EXAM: CT CHEST WITH CONTRAST 09/21/2024 11:06:00 PM TECHNIQUE: CT of the chest was performed with the administration of 75 mL of iohexol  (OMNIPAQUE ) 350 MG/ML injection. Multiplanar reformatted images are provided for review. Automated exposure control, iterative reconstruction, and/or weight based adjustment of the mA/kV  was utilized to reduce the radiation dose to as low as reasonably achievable. COMPARISON: 12/15/2023 CLINICAL HISTORY: Sepsis, recent breast reduction, eval for abscess. FINDINGS: MEDIASTINUM: Heart and pericardium are unremarkable. The central airways are clear. The thoracic aorta is within normal limits. 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 The esophagus is within normal limits. LYMPH NODES: No mediastinal, hilar or axillary lymphadenopathy. LUNGS AND PLEURA: The lungs are well aerated bilaterally. No focal infiltrate or pulmonary edema. No pleural effusion or pneumothorax. No evidence of pulmonary embolism is seen, although the study is not timed for embolus evaluation. SOFT TISSUES/BONES: No acute bony abnormality is noted. There are changes consistent with the given clinical history of bilateral breast reduction. There is a mildly enhancing  fluid collection identified near the inferior aspect of the right breast measuring 7.2 x 5.5 cm. This extends for approximately 11 cm in craniocaudal projection. A similar but smaller fluid collection is noted on the left measuring 4.5 x 3.2 cm and extending for at least 7 cm in craniocaudal projection. UPPER ABDOMEN: Limited images of the upper abdomen demonstrates no acute abnormality. Visualized upper abdomen is unremarkable. IMPRESSION: 1. Bilateral peripherally enhancing fluid collections in the area of prior surgery, right greater than left. Although these may simply represent postoperative seromas, the possibility of developing abscess deserves consideration. Fluid sampling/drainage is recommended. Electronically signed by: Oneil Devonshire MD 09/21/2024 11:15 PM EST RP Workstation: [id number]    EKG: I independently viewed the EKG done and my findings are as followed: None available at the time of this visit.  Assessment/Plan Present on Admission:  Infection of deep incisional surgical site after procedure, initial encounter  Principal Problem:   Infection of deep incisional surgical site after procedure, initial encounter  Infection of deep incisional surgical site after procedure, POA CT chest with contrast revealed bilateral peripherally enhancing fluid collections in the area of prior surgery, right greater than left.  Although this may simply represent postoperative seromas the possibility of developing abscess deserves consideration.  Fluid sampling and drainage is recommended. IV vancomycin  and cefepime  started in the ER, continue Plastic surgery consulted and will see in the morning. Pain control as needed Gentle IV hydration NPO except for sips with meds until seen by plastic surgery.  Hypokalemia Serum potassium 3.1 Repleted orally Repeat BMP in the morning Check magnesium  level.  Elevated ALT ALT 62 Avoid hepatotoxic agents.  Obesity BMI 37 Recommend weight loss  outpatient with regular physical activity and healthy dieting.  Acute Pruritus, suspect from IV antibiotics Possibly from Histamine release from IV Vancomycin  IV Benadryl  25 mg Q6h PRN   Time: 75 minutes.   DVT prophylaxis: SCDs.  Code Status: Full code.  Family Communication: Significant other at her bedside.  Disposition Plan: Admitted to telemetry unit.  Consults called: Plastic surgery consulted by EDP.  Admission status: Inpatient status.   Status is: Inpatient The patient requires at least 2 midnights for further evaluation and treatment of present condition.   Terry LOISE Hurst MD [facility] Pager [phone]  If 7PM-7AM, please contact night-coverage [email or web address] Password [facility]  09/22/2024, 12:25 AM      [1]  Allergies Allergen Reactions   Infliximab  Anaphylaxis    Remicade    Other Anaphylaxis    Iron  Supplements   Risankizumab Itching, Other (See Comments) and Cough    Chest pressure Skyrizi

## 2024-09-22 NOTE — ED Notes (Signed)
 Meal tray ordered

## 2024-09-22 NOTE — Progress Notes (Signed)
 Pharmacy Antibiotic Note  Sandra Wilkerson is a 28 y.o. female admitted on 09/21/2024 with cellulitis/wound infection of the right breast s/p bilateral reduction surgery one month ago  .  Pharmacy has been consulted for Vancomycin  dosing. WBC elevated. Renal function good.   Plan: Vancomycin  1250 mg IV q24h >>>Estimated AUC: 481 Trend WBC, temp, renal function  F/U infectious work-up Drug levels as indicated   Height: 4' 11 (149.9 cm) Weight: 83.1 kg (183 lb 3.2 oz) IBW/kg (Calculated) : 43.2  Temp (24hrs), Avg:99.7 F (37.6 C), Min:98.7 F (37.1 C), Max:100.6 F (38.1 C)  Recent Labs  Lab 09/21/24 2114 09/21/24 2116  WBC 16.4*  --   CREATININE 0.79  --   LATICACIDVEN  --  1.8    Estimated Creatinine Clearance: 96.1 mL/min (by C-G formula based on SCr of 0.79 mg/dL).    Allergies[1]  Lynwood Mckusick, PharmD, BCPS Clinical Pharmacist Phone: [phone]     [1]  Allergies Allergen Reactions   Infliximab  Anaphylaxis    Remicade    Other Anaphylaxis    Iron  Supplements   Risankizumab Itching, Other (See Comments) and Cough    Chest pressure Skyrizi

## 2024-09-23 ENCOUNTER — Inpatient Hospital Stay (HOSPITAL_COMMUNITY)

## 2024-09-23 LAB — IRON AND TIBC
Iron: 18 ug/dL — ABNORMAL LOW (ref 28–170)
Saturation Ratios: 7 % — ABNORMAL LOW (ref 10.4–31.8)
TIBC: 262 ug/dL (ref 250–450)
UIBC: 244 ug/dL

## 2024-09-23 LAB — CBC WITH DIFFERENTIAL/PLATELET
Abs Immature Granulocytes: 0.05 K/uL (ref 0.00–0.07)
Basophils Absolute: 0.1 K/uL (ref 0.0–0.1)
Basophils Relative: 1 %
Eosinophils Absolute: 0.5 K/uL (ref 0.0–0.5)
Eosinophils Relative: 5 %
HCT: 32.6 % — ABNORMAL LOW (ref 36.0–46.0)
Hemoglobin: 10.2 g/dL — ABNORMAL LOW (ref 12.0–15.0)
Immature Granulocytes: 1 %
Lymphocytes Relative: 28 %
Lymphs Abs: 3 K/uL (ref 0.7–4.0)
MCH: 26.2 pg (ref 26.0–34.0)
MCHC: 31.3 g/dL (ref 30.0–36.0)
MCV: 83.8 fL (ref 80.0–100.0)
Monocytes Absolute: 0.6 K/uL (ref 0.1–1.0)
Monocytes Relative: 6 %
Neutro Abs: 6.5 K/uL (ref 1.7–7.7)
Neutrophils Relative %: 59 %
Platelets: 277 K/uL (ref 150–400)
RBC: 3.89 MIL/uL (ref 3.87–5.11)
RDW: 13.5 % (ref 11.5–15.5)
WBC: 10.7 K/uL — ABNORMAL HIGH (ref 4.0–10.5)
nRBC: 0 % (ref 0.0–0.2)

## 2024-09-23 LAB — COMPREHENSIVE METABOLIC PANEL WITH GFR
ALT: 57 U/L — ABNORMAL HIGH (ref 0–44)
AST: 28 U/L (ref 15–41)
Albumin: 2.4 g/dL — ABNORMAL LOW (ref 3.5–5.0)
Alkaline Phosphatase: 123 U/L (ref 38–126)
Anion gap: 9 (ref 5–15)
BUN: 8 mg/dL (ref 6–20)
CO2: 22 mmol/L (ref 22–32)
Calcium: 8.1 mg/dL — ABNORMAL LOW (ref 8.9–10.3)
Chloride: 106 mmol/L (ref 98–111)
Creatinine, Ser: 0.69 mg/dL (ref 0.44–1.00)
GFR, Estimated: >60 mL/min (ref >60)
Glucose, Bld: 102 mg/dL — ABNORMAL HIGH (ref 70–99)
Potassium: 3.8 mmol/L (ref 3.5–5.1)
Sodium: 137 mmol/L (ref 135–145)
Total Bilirubin: 0.5 mg/dL (ref 0.0–1.2)
Total Protein: 6.4 g/dL — ABNORMAL LOW (ref 6.5–8.1)

## 2024-09-23 LAB — VITAMIN B12: Vitamin B-12: 293 pg/mL (ref 180–914)

## 2024-09-23 LAB — FOLATE: Folate: 8.3 ng/mL (ref >5.9)

## 2024-09-23 LAB — FERRITIN: Ferritin: 164 ng/mL (ref 11–307)

## 2024-09-23 LAB — PHOSPHORUS: Phosphorus: 4.1 mg/dL (ref 2.5–4.6)

## 2024-09-23 LAB — MAGNESIUM: Magnesium: 1.7 mg/dL (ref 1.7–2.4)

## 2024-09-23 MED ORDER — FERROUS SULFATE 325 (65 FE) MG PO TABS
325.0000 mg | ORAL_TABLET | ORAL | Status: DC
  Administered 2024-09-23 – 2024-09-25 (×2): 325 mg via ORAL
  Filled 2024-09-23 (×2): qty 1

## 2024-09-23 NOTE — Progress Notes (Signed)
 PROGRESS NOTE    Sandra Wilkerson  FMW:[id number] DOB: Jun 29, 1996 DOA: 09/21/2024 PCP: Carin Gauze, NP  Chief Complaint  Patient presents with   Generalized Body Aches   Chills   Post-op Problem    Brief Narrative:   Sandra Wilkerson is Sandra Wilkerson 28 y.o. female with medical history significant for obesity, symptomatic mammary hypertrophy, who presents to the ER due to bilateral breast pain and swelling.  The patient recently had bilateral breast reduction with Dr. Lowery on 08/21/2024.  She was seen by her plastic surgeon's office Rima Blizzard week ago with concern for bilateral breast swelling, right greater than left.  Associated with brownish drainage from the right breast.  In the plastic surgery's office the patient had her breast massage with moderate amount of brownish fluid drained from the right, consistent with old blood/residual hematoma.  The patient felt relief and the breast felt softer after discharge.  The patient was advised to continue with massage and Xeroform dressings, and to apply Vaseline throughout her incisions, besides Jayshun Galentine small wound to the right lateral inframammary incision.   She presented to the ED with R breast swelling and pain associated with chills.  Admitted with concern for post op infection.  Assessment & Plan:   Principal Problem:   Infection of deep incisional surgical site after procedure, initial encounter  Sepsis due to Post Operative Infection Rules in with fever, tachycardia, leukocytosis, tachypnea + concern for post op infection CT with bilateral peripherally enhancing fluid collections in the area of prior surgery UA contaminated with squams Blood cultures Ngx2 S/p IR drainage 12/15 - culture with gram negative rods Continue vancomycin , ceftriaxone  Appreciate plastic surgery consult - ? Whether drain can be placed on R Bowel regimen, toradol , oxy prn pain   Anemia Iron  Def Low iron , %sat, normal ferritin.  Low normal b12, normal  folate. Supplement iron .  Follow MMA and supplement b12.   Hypokalemia Replace and follow   Hypophosphatemia Replace and follow   Elevated ALT Mildly Elevated Bilirubin Noted, trend  Pruritus Thought related to vanc - will continue to monitor, has benadryl  prn  Obesity Body mass index is 37 kg/m.    DVT prophylaxis: lovenox  Code Status: full Family Communication: none Disposition:   Status is: Inpatient Remains inpatient appropriate because: need for continued inpatient care   Consultants:  Plastic surgery  Procedures:  none  Antimicrobials:  Anti-infectives (From admission, onward)    Start     Dose/Rate Route Frequency Ordered Stop   09/22/24 2200  vancomycin  (VANCOREADY) IVPB 1250 mg/250 mL        1,250 mg 166.7 mL/hr over 90 Minutes Intravenous Every 24 hours 09/22/24 0030     09/22/24 1400  cefTRIAXone  (ROCEPHIN ) 2 g in sodium chloride  0.9 % 100 mL IVPB        2 g 200 mL/hr over 30 Minutes Intravenous Every 24 hours 09/22/24 0705     09/22/24 0045  vancomycin  (VANCOREADY) IVPB 1500 mg/300 mL        1,500 mg 150 mL/hr over 120 Minutes Intravenous Once 09/22/24 0030 09/22/24 0237   09/22/24 0030  ceFEPIme  (MAXIPIME ) 2 g in sodium chloride  0.9 % 100 mL IVPB  Status:  Discontinued        2 g 200 mL/hr over 30 Minutes Intravenous  Once 09/22/24 0020 09/22/24 0025   09/22/24 0030  ceFEPIme  (MAXIPIME ) 2 g in sodium chloride  0.9 % 100 mL IVPB  Status:  Discontinued        2  g 200 mL/hr over 30 Minutes Intravenous Every 8 hours 09/22/24 0025 09/22/24 0705   09/22/24 0000  vancomycin  (VANCOREADY) IVPB 1500 mg/300 mL  Status:  Discontinued        1,500 mg 150 mL/hr over 120 Minutes Intravenous Every 12 hours 09/21/24 2345 09/22/24 0030   09/21/24 2330  vancomycin  (VANCOCIN ) IVPB 1000 mg/200 mL premix  Status:  Discontinued        1,000 mg 200 mL/hr over 60 Minutes Intravenous  Once 09/21/24 2321 09/21/24 2323   09/21/24 2330  Vancomycin  (VANCOCIN ) 1,500 mg in  sodium chloride  0.9 % 500 mL IVPB  Status:  Discontinued        1,500 mg 250 mL/hr over 120 Minutes Intravenous  Once 09/21/24 2323 09/21/24 2345       Subjective: Feels better overall  Objective: Vitals:   09/22/24 1749 09/23/24 0027 09/23/24 0500 09/23/24 0900  BP: 134/85 122/79 103/62 114/75  Pulse: 95 95 81 78  Resp: 16 18 17 16   Temp: 98.6 F (37 C) 99.5 F (37.5 C) 98.6 F (37 C) 98.2 F (36.8 C)  TempSrc: Oral Oral Oral Oral  SpO2: 99% 100% 99% 99%  Weight:      Height:        Intake/Output Summary (Last 24 hours) at 09/23/2024 1409 Last data filed at 09/23/2024 0403 Gross per 24 hour  Intake 2347.48 ml  Output --  Net 2347.48 ml   Filed Weights   09/21/24 2055  Weight: 83.1 kg    Examination:  General: No acute distress. Cardiovascular: RRR Lungs: unlabored Breast exam with RN Susi as chaperone - induration R>L noted at bilateral breast, TTP in these areas Neurological: Alert and oriented 3. Moves all extremities 4 with equal strength. Cranial nerves II through XII grossly intact. Extremities: No clubbing or cyanosis. No edema.   Data Reviewed: I have personally reviewed following labs and imaging studies  CBC: Recent Labs  Lab 09/21/24 2114 09/22/24 0422 09/23/24 0544  WBC 16.4* 14.6* 10.7*  NEUTROABS 12.4* 10.6* 6.5  HGB 11.5* 9.3* 10.2*  HCT 35.7* 29.5* 32.6*  MCV 81.9 81.9 83.8  PLT 323 270 277    Basic Metabolic Panel: Recent Labs  Lab 09/21/24 2114 09/22/24 0422 09/23/24 0544  NA 135 138 137  K 3.1* 3.1* 3.8  CL 100 107 106  CO2 22 24 22   GLUCOSE 125* 100* 102*  BUN 9 6 8   CREATININE 0.79 0.57 0.69  CALCIUM 8.9 7.6* 8.1*  MG  --  1.4* 1.7  PHOS  --  2.2* 4.1    GFR: Estimated Creatinine Clearance: 96.1 mL/min (by C-G formula based on SCr of 0.69 mg/dL).  Liver Function Tests: Recent Labs  Lab 09/21/24 2114 09/22/24 0422 09/23/24 0544  AST 38 22 28  ALT 62* 44 57*  ALKPHOS 111 84 123  BILITOT 1.2 1.3* 0.5   PROT 7.7 5.6* 6.4*  ALBUMIN 3.2* 2.4* 2.4*    CBG: No results for input(s): GLUCAP in the last 168 hours.   Recent Results (from the past 240 hours)  Culture, blood (Routine x 2)     Status: None (Preliminary result)   Collection Time: 09/21/24  9:00 PM   Specimen: BLOOD  Result Value Ref Range Status   Specimen Description BLOOD BLOOD LEFT HAND  Final   Special Requests   Final    BOTTLES DRAWN AEROBIC AND ANAEROBIC Blood Culture results may not be optimal due to an inadequate volume of blood received in culture  bottles   Culture   Final    NO GROWTH 2 DAYS Performed at [facility], [address] [place], [place] [address]    Report Status PENDING  Incomplete  Culture, blood (Routine x 2)     Status: None (Preliminary result)   Collection Time: 09/21/24  9:14 PM   Specimen: BLOOD  Result Value Ref Range Status   Specimen Description BLOOD BLOOD LEFT HAND  Final   Special Requests   Final    BOTTLES DRAWN AEROBIC AND ANAEROBIC Blood Culture adequate volume   Culture   Final    NO GROWTH 2 DAYS Performed at [facility], [address] [place], [place] [address]    Report Status PENDING  Incomplete  Aerobic/Anaerobic Culture w Gram Stain (surgical/deep wound)     Status: None (Preliminary result)   Collection Time: 09/22/24  4:00 PM   Specimen: Abscess  Result Value Ref Range Status   Specimen Description ABSCESS  Final   Special Requests RIGHT BREAST  Final   Gram Stain   Final    ABUNDANT WBC PRESENT, PREDOMINANTLY PMN NO ORGANISMS SEEN    Culture   Final    MODERATE GRAM NEGATIVE RODS IDENTIFICATION AND SUSCEPTIBILITIES TO FOLLOW Performed at [facility], [address] [place], [place] [address]    Report Status PENDING  Incomplete         Radiology Studies: US  Abscess Drain Result Date: 09/22/2024 INDICATION: Postoperative abscess breast EXAM: Ultrasound-guided aspiration MEDICATIONS: The patient is currently admitted to the  hospital and receiving intravenous antibiotics. The antibiotics were administered within an appropriate time frame prior to the initiation of the procedure. ANESTHESIA/SEDATION: Moderate (conscious) sedation was employed during this procedure. Ercel Pepitone total of Versed  2 mg and Fentanyl  100 mcg was administered intravenously by the radiology nurse. Total intra-service moderate Sedation Time: 17 minutes. The patient's level of consciousness and vital signs were monitored continuously by radiology nursing throughout the procedure under my direct supervision. COMPLICATIONS: None immediate. PROCEDURE: Informed written consent was obtained from the patient after Rozalia Dino thorough discussion of the procedural risks, benefits and alternatives. All questions were addressed. Maximal Sterile Barrier Technique was utilized including caps, mask, sterile gowns, sterile gloves, sterile drape, hand hygiene and skin antiseptic. Calaya Gildner timeout was performed prior to the initiation of the procedure. In Shamarr Faucett supine position with head of bed elevated 45 degrees, the right breast was evaluated with ultrasound. Natalie Mceuen large complex fluid collection is identified in the outer aspect the 9 o'clock position of the right breast. Patient's skin was prepped and draped in usual sterile fashion. Local anesthesia was achieved with 1% lidocaine . Eriana Suliman small incision was made o'clock position. Brinson Tozzi 10 cm Yueh needle was then advanced under ultrasound guidance into the collection. Aspiration resulted in removal of 145 mL reddish-brown material likely representing an infected hematoma. The fluid collection was almost completely reduced. Sterile dressing applied. Saiquan Hands small sample was sent to laboratory for culture and sensitivity. IMPRESSION: Successful aspiration of 145 mL of fluid from the right breast likely representing infected hematoma. Yanely Mast small sample was sent to laboratory for culture and sensitivity. Electronically Signed   By: Cordella Banner   On: 09/22/2024 16:35   CT Chest  W Contrast Result Date: 09/21/2024 EXAM: CT CHEST WITH CONTRAST 09/21/2024 11:06:00 PM TECHNIQUE: CT of the chest was performed with the administration of 75 mL of iohexol  (OMNIPAQUE ) 350 MG/ML injection. Multiplanar reformatted images are provided for review. Automated exposure control, iterative  reconstruction, and/or weight based adjustment of the mA/kV was utilized to reduce the radiation dose to as low as reasonably achievable. COMPARISON: 12/15/2023 CLINICAL HISTORY: Sepsis, recent breast reduction, eval for abscess. FINDINGS: MEDIASTINUM: Heart and pericardium are unremarkable. The central airways are clear. The thoracic aorta is within normal limits. 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 The esophagus is within normal limits. LYMPH NODES: No mediastinal, hilar or axillary lymphadenopathy. LUNGS AND PLEURA: The lungs are well aerated bilaterally. No focal infiltrate or pulmonary edema. No pleural effusion or pneumothorax. No evidence of pulmonary embolism is seen, although the study is not timed for embolus evaluation. SOFT TISSUES/BONES: No acute bony abnormality is noted. There are changes consistent with the given clinical history of bilateral breast reduction. There is Leni Pankonin mildly enhancing fluid collection identified near the inferior aspect of the right breast measuring 7.2 x 5.5 cm. This extends for approximately 11 cm in craniocaudal projection. Merrisa Skorupski similar but smaller fluid collection is noted on the left measuring 4.5 x 3.2 cm and extending for at least 7 cm in craniocaudal projection. UPPER ABDOMEN: Limited images of the upper abdomen demonstrates no acute abnormality. Visualized upper abdomen is unremarkable. IMPRESSION: 1. Bilateral peripherally enhancing fluid collections in the area of prior surgery, right greater than left. Although these may simply represent postoperative seromas, the possibility of developing abscess deserves consideration.  Fluid sampling/drainage is recommended. Electronically signed by: Oneil Devonshire MD 09/21/2024 11:15 PM EST RP Workstation: [id number]        Scheduled Meds:  enoxaparin  (LOVENOX ) injection  40 mg Subcutaneous Q24H   ferrous sulfate   325 mg Oral QODAY   ketorolac   15 mg Intravenous Q6H   polyethylene glycol  17 g Oral BID   Continuous Infusions:  cefTRIAXone  (ROCEPHIN )  IV Stopped (09/22/24 1640)   vancomycin  Stopped (09/22/24 2301)     LOS: 1 day    Time spent: over 30 min    Meliton Monte, MD [facility]   To contact the attending provider between 7A-7P or the covering provider during after hours 7P-7A, please log into the web site [email or web address] and access using universal [place] password for that web site. If you do not have the password, please call the hospital operator.  09/23/2024, 2:09 PM

## 2024-09-23 NOTE — Plan of Care (Signed)

## 2024-09-23 NOTE — Progress Notes (Cosign Needed Addendum)
 Subjective: Patient is a 28 year old female who underwent bilateral breast reduction with Dr. Lowery on 08/21/2024.  Patient presented to [name] ER on 09/21/2024 reporting that she was feeling feverish and complained of bodyaches and chills.  Patient was admitted for IV antibiotics and observation.  Per chart review, patient had US  guided aspiration of right breast yielding 145 mL of reddish-brown material.  Today, patient reports she is overall doing well.  She is sitting comfortably in bed in no acute distress.  She reports that she felt some relief this morning after she went to IR yesterday to have fluid collection drained.  She denies any fevers overnight.  She reports that she does have some tenderness to the bilateral breast, but overall her pain has been controlled.  She states that she is eager to go home.  Objective: Vital signs in last 24 hours: Temp:  [98.2 F (36.8 C)-99.5 F (37.5 C)] 98.2 F (36.8 C) (12/16 0900) Pulse Rate:  [78-104] 78 (12/16 0900) Resp:  [12-31] 16 (12/16 0900) BP: (102-134)/(62-85) 114/75 (12/16 0900) SpO2:  [99 %-100 %] 99 % (12/16 0900)    Intake/Output from previous day: 12/15 0701 - 12/16 0700 In: 2347.5 [I.V.:2000; IV Piggyback:347.5] Out: -  Intake/Output this shift: No intake/output data recorded.  General appearance: alert, cooperative, and no distress Resp: Unlabored breathing, no respiratory distress Breasts: Breasts are overall soft and fairly symmetric.  There is no overlying erythema to either breast.  There is a little bit of firmness noted to the inferior/lateral aspect of each breast.  There is no overlying skin changes to this area.  NAC's appear to be overall healing well.  Incisions are overall intact and healing well.  There is though a small superficial wound noted to the right lateral inframammary incision.  There is active drainage noted on exam.  Upon palpation and massage, I was able to express a minimal amount of  light brown thin drainage.   Lab Results:     Latest Ref Rng & Units 09/23/2024    5:44 AM 09/22/2024    4:22 AM 09/21/2024    9:14 PM  CBC  WBC 4.0 - 10.5 K/uL 10.7  14.6  16.4   Hemoglobin 12.0 - 15.0 g/dL 89.7  9.3  88.4   Hematocrit 36.0 - 46.0 % 32.6  29.5  35.7   Platelets 150 - 400 K/uL 277  270  323     BMET Recent Labs    09/22/24 0422 09/23/24 0544  NA 138 137  K 3.1* 3.8  CL 107 106  CO2 24 22  GLUCOSE 100* 102*  BUN 6 8  CREATININE 0.57 0.69  CALCIUM 7.6* 8.1*   PT/INR Recent Labs    09/21/24 2114  LABPROT 15.3*  INR 1.1   ABG No results for input(s): PHART, HCO3 in the last 72 hours.  Invalid input(s): PCO2, PO2  Studies/Results: US  Abscess Drain Result Date: 09/22/2024 INDICATION: Postoperative abscess breast EXAM: Ultrasound-guided aspiration MEDICATIONS: The patient is currently admitted to the hospital and receiving intravenous antibiotics. The antibiotics were administered within an appropriate time frame prior to the initiation of the procedure. ANESTHESIA/SEDATION: Moderate (conscious) sedation was employed during this procedure. A total of Versed  2 mg and Fentanyl  100 mcg was administered intravenously by the radiology nurse. Total intra-service moderate Sedation Time: 17 minutes. The patient's level of consciousness and vital signs were monitored continuously by radiology nursing throughout the procedure under my direct supervision. COMPLICATIONS: None immediate. PROCEDURE: Informed written consent was  obtained from the patient after a thorough discussion of the procedural risks, benefits and alternatives. All questions were addressed. Maximal Sterile Barrier Technique was utilized including caps, mask, sterile gowns, sterile gloves, sterile drape, hand hygiene and skin antiseptic. A timeout was performed prior to the initiation of the procedure. In a supine position with head of bed elevated 45 degrees, the right breast was evaluated with  ultrasound. A large complex fluid collection is identified in the outer aspect the 9 o'clock position of the right breast. Patient's skin was prepped and draped in usual sterile fashion. Local anesthesia was achieved with 1% lidocaine . A small incision was made o'clock position. A 10 cm Yueh needle was then advanced under ultrasound guidance into the collection. Aspiration resulted in removal of 145 mL reddish-brown material likely representing an infected hematoma. The fluid collection was almost completely reduced. Sterile dressing applied. A small sample was sent to laboratory for culture and sensitivity. IMPRESSION: Successful aspiration of 145 mL of fluid from the right breast likely representing infected hematoma. A small sample was sent to laboratory for culture and sensitivity. Electronically Signed   By: Cordella Banner   On: 09/22/2024 16:35   CT Chest W Contrast Result Date: 09/21/2024 EXAM: CT CHEST WITH CONTRAST 09/21/2024 11:06:00 PM TECHNIQUE: CT of the chest was performed with the administration of 75 mL of iohexol  (OMNIPAQUE ) 350 MG/ML injection. Multiplanar reformatted images are provided for review. Automated exposure control, iterative reconstruction, and/or weight based adjustment of the mA/kV was utilized to reduce the radiation dose to as low as reasonably achievable. COMPARISON: 12/15/2023 CLINICAL HISTORY: Sepsis, recent breast reduction, eval for abscess. FINDINGS: MEDIASTINUM: Heart and pericardium are unremarkable. The central airways are clear. The thoracic aorta is within normal limits. 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 The esophagus is within normal limits. LYMPH NODES: No mediastinal, hilar or axillary lymphadenopathy. LUNGS AND PLEURA: The lungs are well aerated bilaterally. No focal infiltrate or pulmonary edema. No pleural effusion or pneumothorax. No evidence of pulmonary embolism is seen, although the study is not  timed for embolus evaluation. SOFT TISSUES/BONES: No acute bony abnormality is noted. There are changes consistent with the given clinical history of bilateral breast reduction. There is a mildly enhancing fluid collection identified near the inferior aspect of the right breast measuring 7.2 x 5.5 cm. This extends for approximately 11 cm in craniocaudal projection. A similar but smaller fluid collection is noted on the left measuring 4.5 x 3.2 cm and extending for at least 7 cm in craniocaudal projection. UPPER ABDOMEN: Limited images of the upper abdomen demonstrates no acute abnormality. Visualized upper abdomen is unremarkable. IMPRESSION: 1. Bilateral peripherally enhancing fluid collections in the area of prior surgery, right greater than left. Although these may simply represent postoperative seromas, the possibility of developing abscess deserves consideration. Fluid sampling/drainage is recommended. Electronically signed by: Oneil Devonshire MD 09/21/2024 11:15 PM EST RP Workstation: [id number]    Anti-infectives: Anti-infectives (From admission, onward)    Start     Dose/Rate Route Frequency Ordered Stop   09/22/24 2200  vancomycin  (VANCOREADY) IVPB 1250 mg/250 mL        1,250 mg 166.7 mL/hr over 90 Minutes Intravenous Every 24 hours 09/22/24 0030     09/22/24 1400  cefTRIAXone  (ROCEPHIN ) 2 g in sodium chloride  0.9 % 100 mL IVPB        2 g 200 mL/hr over 30 Minutes Intravenous Every 24 hours 09/22/24 0705  09/22/24 0045  vancomycin  (VANCOREADY) IVPB 1500 mg/300 mL        1,500 mg 150 mL/hr over 120 Minutes Intravenous Once 09/22/24 0030 09/22/24 0237   09/22/24 0030  ceFEPIme  (MAXIPIME ) 2 g in sodium chloride  0.9 % 100 mL IVPB  Status:  Discontinued        2 g 200 mL/hr over 30 Minutes Intravenous  Once 09/22/24 0020 09/22/24 0025   09/22/24 0030  ceFEPIme  (MAXIPIME ) 2 g in sodium chloride  0.9 % 100 mL IVPB  Status:  Discontinued        2 g 200 mL/hr over 30 Minutes Intravenous Every 8  hours 09/22/24 0025 09/22/24 0705   09/22/24 0000  vancomycin  (VANCOREADY) IVPB 1500 mg/300 mL  Status:  Discontinued        1,500 mg 150 mL/hr over 120 Minutes Intravenous Every 12 hours 09/21/24 2345 09/22/24 0030   09/21/24 2330  vancomycin  (VANCOCIN ) IVPB 1000 mg/200 mL premix  Status:  Discontinued        1,000 mg 200 mL/hr over 60 Minutes Intravenous  Once 09/21/24 2321 09/21/24 2323   09/21/24 2330  Vancomycin  (VANCOCIN ) 1,500 mg in sodium chloride  0.9 % 500 mL IVPB  Status:  Discontinued        1,500 mg 250 mL/hr over 120 Minutes Intravenous  Once 09/21/24 2323 09/21/24 2345       Assessment/Plan: Plan:  -Patient is overall feeling better after aspiration.  I did discuss with the patient that she appears to still be draining from her right side.  We did discuss the possibility of drain placement during her hospitalization.  She was agreeable to this. I spoke with IR team and they are going to evaluate to see if drain placement is possible.  -ADDEND: spoke with IR, requesting breast US  to determine if residual fluid collection is present for drain placement. Order has been placed. Also spoke with IR team to see if L side fluid collection could be drained. NP was going to check with MD.  -Discussed with the patient to continue with compression at all times. -May continue with dressing changes over the NAC's bilaterally with Xeroform and dressing daily.  Recommend that she apply gauze and ABD pad over the right lateral wound daily or as needed if it becomes saturated. -Medical management and pain control per primary team    LOS: 1 day    Sandra FORBES Peck, PA-C 09/23/2024

## 2024-09-24 ENCOUNTER — Inpatient Hospital Stay (HOSPITAL_COMMUNITY)

## 2024-09-24 ENCOUNTER — Encounter: Admitting: Physician Assistant

## 2024-09-24 DIAGNOSIS — T8142XA Infection following a procedure, deep incisional surgical site, initial encounter: Secondary | ICD-10-CM | POA: Diagnosis not present

## 2024-09-24 HISTORY — PX: IR US GUIDE BX ASP/DRAIN: IMG2392

## 2024-09-24 LAB — CBC WITH DIFFERENTIAL/PLATELET
Abs Immature Granulocytes: 0.03 K/uL (ref 0.00–0.07)
Basophils Absolute: 0.1 K/uL (ref 0.0–0.1)
Basophils Relative: 1 %
Eosinophils Absolute: 0.7 K/uL — ABNORMAL HIGH (ref 0.0–0.5)
Eosinophils Relative: 7 %
HCT: 31.6 % — ABNORMAL LOW (ref 36.0–46.0)
Hemoglobin: 10.2 g/dL — ABNORMAL LOW (ref 12.0–15.0)
Immature Granulocytes: 0 %
Lymphocytes Relative: 35 %
Lymphs Abs: 3.5 K/uL (ref 0.7–4.0)
MCH: 26.6 pg (ref 26.0–34.0)
MCHC: 32.3 g/dL (ref 30.0–36.0)
MCV: 82.5 fL (ref 80.0–100.0)
Monocytes Absolute: 0.5 K/uL (ref 0.1–1.0)
Monocytes Relative: 5 %
Neutro Abs: 5.1 K/uL (ref 1.7–7.7)
Neutrophils Relative %: 52 %
Platelets: 305 K/uL (ref 150–400)
RBC: 3.83 MIL/uL — ABNORMAL LOW (ref 3.87–5.11)
RDW: 13.2 % (ref 11.5–15.5)
WBC: 9.9 K/uL (ref 4.0–10.5)
nRBC: 0 % (ref 0.0–0.2)

## 2024-09-24 LAB — COMPREHENSIVE METABOLIC PANEL WITH GFR
ALT: 52 U/L — ABNORMAL HIGH (ref 0–44)
AST: 25 U/L (ref 15–41)
Albumin: 3.3 g/dL — ABNORMAL LOW (ref 3.5–5.0)
Alkaline Phosphatase: 156 U/L — ABNORMAL HIGH (ref 38–126)
Anion gap: 9 (ref 5–15)
BUN: 8 mg/dL (ref 6–20)
CO2: 22 mmol/L (ref 22–32)
Calcium: 8.7 mg/dL — ABNORMAL LOW (ref 8.9–10.3)
Chloride: 105 mmol/L (ref 98–111)
Creatinine, Ser: 0.52 mg/dL (ref 0.44–1.00)
GFR, Estimated: >60 mL/min (ref >60)
Glucose, Bld: 116 mg/dL — ABNORMAL HIGH (ref 70–99)
Potassium: 3.7 mmol/L (ref 3.5–5.1)
Sodium: 135 mmol/L (ref 135–145)
Total Bilirubin: <0.2 mg/dL (ref 0.0–1.2)
Total Protein: 6.8 g/dL (ref 6.5–8.1)

## 2024-09-24 LAB — MAGNESIUM: Magnesium: 1.8 mg/dL (ref 1.7–2.4)

## 2024-09-24 LAB — PHOSPHORUS: Phosphorus: 2.9 mg/dL (ref 2.5–4.6)

## 2024-09-24 MED ORDER — VANCOMYCIN HCL 1250 MG/250ML IV SOLN: 1250.0000 mg | INTRAVENOUS | Status: DC

## 2024-09-24 MED ORDER — FAMOTIDINE IN NACL 20-0.9 MG/50ML-% IV SOLN
20.0000 mg | Freq: Once | INTRAVENOUS | Status: AC
  Administered 2024-09-24: 01:00:00 20 mg via INTRAVENOUS
  Filled 2024-09-24: qty 50

## 2024-09-24 MED ORDER — MIDAZOLAM HCL (PF) 2 MG/2ML IJ SOLN
INTRAMUSCULAR | Status: AC | PRN
  Administered 2024-09-24 (×4): 1 mg via INTRAVENOUS

## 2024-09-24 MED ORDER — LIDOCAINE HCL 1 % IJ SOLN
20.0000 mL | Freq: Once | INTRAMUSCULAR | Status: AC
  Administered 2024-09-24: 15:00:00 10 mL via INTRADERMAL

## 2024-09-24 MED ORDER — DIPHENHYDRAMINE HCL 25 MG PO CAPS: 25.0000 mg | ORAL_CAPSULE | Freq: Four times a day (QID) | ORAL | Status: DC | PRN

## 2024-09-24 MED ORDER — FENTANYL CITRATE (PF) 100 MCG/2ML IJ SOLN
INTRAMUSCULAR | Status: AC | PRN
  Administered 2024-09-24: 15:00:00 25 ug via INTRAVENOUS
  Administered 2024-09-24: 14:00:00 50 ug via INTRAVENOUS
  Administered 2024-09-24: 15:00:00 25 ug via INTRAVENOUS
  Administered 2024-09-24: 14:00:00 50 ug via INTRAVENOUS

## 2024-09-24 MED ORDER — SULFAMETHOXAZOLE-TRIMETHOPRIM 800-160 MG PO TABS
1.0000 | ORAL_TABLET | Freq: Two times a day (BID) | ORAL | Status: DC
  Administered 2024-09-24 – 2024-09-25 (×3): 1 via ORAL
  Filled 2024-09-24 (×3): qty 1

## 2024-09-24 MED ORDER — MIDAZOLAM HCL 2 MG/2ML IJ SOLN
INTRAMUSCULAR | Status: AC
  Filled 2024-09-24: qty 4

## 2024-09-24 MED ORDER — LIDOCAINE HCL 1 % IJ SOLN
INTRAMUSCULAR | Status: AC
  Filled 2024-09-24: qty 20

## 2024-09-24 MED ORDER — FENTANYL CITRATE (PF) 100 MCG/2ML IJ SOLN
INTRAMUSCULAR | Status: AC
  Filled 2024-09-24: qty 4

## 2024-09-24 MED ORDER — SODIUM CHLORIDE 0.9% FLUSH
5.0000 mL | Freq: Three times a day (TID) | INTRAVENOUS | Status: DC
  Administered 2024-09-24 – 2024-09-25 (×2): 5 mL

## 2024-09-24 MED ADMIN — Loratadine Tab 10 MG: 10 mg | ORAL | @ 11:00:00 | NDC 70010016201

## 2024-09-24 MED FILL — Loratadine Tab 10 MG: 10.0000 mg | ORAL | Qty: 1 | Status: AC

## 2024-09-24 NOTE — Progress Notes (Signed)
 PROGRESS NOTE    Sandra Wilkerson  FMW:[id number] DOB: [birth year] DOA: 09/21/2024 PCP: Carin Gauze, NP  Chief Complaint  Patient presents with   Generalized Body Aches   Chills   Post-op Problem    Brief Narrative:   Sandra Wilkerson is Sandra Wilkerson 28 y.o. female with medical history significant for obesity, symptomatic mammary hypertrophy, who presents to the ER due to bilateral breast pain and swelling.  The patient recently had bilateral breast reduction with Dr. Lowery on 08/21/2024.  She was seen by her plastic surgeon's office Eleonore Shippee week ago with concern for bilateral breast swelling, right greater than left.  Associated with brownish drainage from the right breast.  In the plastic surgery's office the patient had her breast massage with moderate amount of brownish fluid drained from the right, consistent with old blood/residual hematoma.  The patient felt relief and the breast felt softer after discharge.  The patient was advised to continue with massage and Xeroform dressings, and to apply Vaseline throughout her incisions, besides Dacari Beckstrand small wound to the right lateral inframammary incision.   She presented to the ED with R breast swelling and pain associated with chills.  Admitted with concern for post op infection.  Assessment & Plan:   Principal Problem:   Infection of deep incisional surgical site after procedure, initial encounter  Sepsis due to Post Operative Infection Rules in with fever, tachycardia, leukocytosis, tachypnea + concern for post op infection CT with bilateral peripherally enhancing fluid collections in the area of prior surgery UA contaminated with squams Blood cultures Ngx2 S/p IR drainage 12/15 - culture with gram negative rods Continue vancomycin , ceftriaxone  Appreciate plastic surgery consult - ? Whether drain can be placed on R and whether L side collection can be drained Bowel regimen, toradol , oxy prn pain   Vancomycin  Infusion Reaction With gram  negative rods on culture, will consider discontinuation - will discuss with plastics Discussed with pharmacy, plan on otherwise slowing infusion rate Note eosinophils - will trend  Anemia Iron  Def Low iron , %sat, normal ferritin.  Low normal b12, normal folate. Supplement iron .  Follow MMA and supplement b12.   Hypokalemia Replace and follow   Hypophosphatemia Replace and follow   Elevated ALT Mildly Elevated Bilirubin Noted, trend  Pruritus Thought related to vanc - will continue to monitor, has benadryl  prn  Obesity Body mass index is 37 kg/m.    DVT prophylaxis: lovenox  Code Status: full Family Communication: none Disposition:   Status is: Inpatient Remains inpatient appropriate because: need for continued inpatient care   Consultants:  Plastic surgery  Procedures:  none  Antimicrobials:  Anti-infectives (From admission, onward)    Start     Dose/Rate Route Frequency Ordered Stop   09/22/24 2200  vancomycin  (VANCOREADY) IVPB 1250 mg/250 mL        1,250 mg 166.7 mL/hr over 90 Minutes Intravenous Every 24 hours 09/22/24 0030     09/22/24 1400  cefTRIAXone  (ROCEPHIN ) 2 g in sodium chloride  0.9 % 100 mL IVPB        2 g 200 mL/hr over 30 Minutes Intravenous Every 24 hours 09/22/24 0705     09/22/24 0045  vancomycin  (VANCOREADY) IVPB 1500 mg/300 mL        1,500 mg 150 mL/hr over 120 Minutes Intravenous Once 09/22/24 0030 09/22/24 0237   09/22/24 0030  ceFEPIme  (MAXIPIME ) 2 g in sodium chloride  0.9 % 100 mL IVPB  Status:  Discontinued        2 g 200  mL/hr over 30 Minutes Intravenous  Once 09/22/24 0020 09/22/24 0025   09/22/24 0030  ceFEPIme  (MAXIPIME ) 2 g in sodium chloride  0.9 % 100 mL IVPB  Status:  Discontinued        2 g 200 mL/hr over 30 Minutes Intravenous Every 8 hours 09/22/24 0025 09/22/24 0705   09/22/24 0000  vancomycin  (VANCOREADY) IVPB 1500 mg/300 mL  Status:  Discontinued        1,500 mg 150 mL/hr over 120 Minutes Intravenous Every 12 hours  09/21/24 2345 09/22/24 0030   09/21/24 2330  vancomycin  (VANCOCIN ) IVPB 1000 mg/200 mL premix  Status:  Discontinued        1,000 mg 200 mL/hr over 60 Minutes Intravenous  Once 09/21/24 2321 09/21/24 2323   09/21/24 2330  Vancomycin  (VANCOCIN ) 1,500 mg in sodium chloride  0.9 % 500 mL IVPB  Status:  Discontinued        1,500 mg 250 mL/hr over 120 Minutes Intravenous  Once 09/21/24 2323 09/21/24 2345       Subjective: Notes rash overnight, itchiness with vancomycin   Objective: Vitals:   09/23/24 1651 09/23/24 2226 09/24/24 0007 09/24/24 0503  BP: (!) 130/92 113/79 (!) 129/95 103/75  Pulse: 62 64 70 (!) 54  Resp: 15 16    Temp: 98.5 F (36.9 C) 98.9 F (37.2 C) 98.4 F (36.9 C) 97.7 F (36.5 C)  TempSrc: Oral Oral Oral Oral  SpO2: 98% 97% 100% 99%  Weight:      Height:        Intake/Output Summary (Last 24 hours) at 09/24/2024 0856 Last data filed at 09/23/2024 1300 Gross per 24 hour  Intake 480 ml  Output --  Net 480 ml   Filed Weights   09/21/24 2055  Weight: 83.1 kg    Examination:  General: No acute distress. Lungs: unlabored SKIN: chaperone present, Shavona RN: no erythematous rash on her bottom, but fine nonpalpable papules noted - breast exam deferred today, will defer to plastic surgery. Neurological: Alert and oriented 3. Moves all extremities 4 with equal strength. Cranial nerves II through XII grossly intact. Extremities: No clubbing or cyanosis. No edema.   Data Reviewed: I have personally reviewed following labs and imaging studies  CBC: Recent Labs  Lab 09/21/24 2114 09/22/24 0422 09/23/24 0544 09/24/24 0311  WBC 16.4* 14.6* 10.7* 9.9  NEUTROABS 12.4* 10.6* 6.5 5.1  HGB 11.5* 9.3* 10.2* 10.2*  HCT 35.7* 29.5* 32.6* 31.6*  MCV 81.9 81.9 83.8 82.5  PLT 323 270 277 305    Basic Metabolic Panel: Recent Labs  Lab 09/21/24 2114 09/22/24 0422 09/23/24 0544 09/24/24 0311  NA 135 138 137 135  K 3.1* 3.1* 3.8 3.7  CL 100 107 106 105   CO2 22 24 22 22   GLUCOSE 125* 100* 102* 116*  BUN 9 6 8 8   CREATININE 0.79 0.57 0.69 0.52  CALCIUM 8.9 7.6* 8.1* 8.7*  MG  --  1.4* 1.7 1.8  PHOS  --  2.2* 4.1 2.9    GFR: Estimated Creatinine Clearance: 96.1 mL/min (by C-G formula based on SCr of 0.52 mg/dL).  Liver Function Tests: Recent Labs  Lab 09/21/24 2114 09/22/24 0422 09/23/24 0544 09/24/24 0311  AST 38 22 28 25   ALT 62* 44 57* 52*  ALKPHOS 111 84 123 156*  BILITOT 1.2 1.3* 0.5 <0.2  PROT 7.7 5.6* 6.4* 6.8  ALBUMIN 3.2* 2.4* 2.4* 3.3*    CBG: No results for input(s): GLUCAP in the last 168 hours.   Recent  Results (from the past 240 hours)  Culture, blood (Routine x 2)     Status: None (Preliminary result)   Collection Time: 09/21/24  9:00 PM   Specimen: BLOOD LEFT HAND  Result Value Ref Range Status   Specimen Description BLOOD LEFT HAND  Final   Special Requests   Final    BOTTLES DRAWN AEROBIC AND ANAEROBIC Blood Culture results may not be optimal due to an inadequate volume of blood received in culture bottles   Culture   Final    NO GROWTH 3 DAYS Performed at [facility], [address] [place], [place] [address]    Report Status PENDING  Incomplete  Culture, blood (Routine x 2)     Status: None (Preliminary result)   Collection Time: 09/21/24  9:14 PM   Specimen: BLOOD LEFT HAND  Result Value Ref Range Status   Specimen Description BLOOD LEFT HAND  Final   Special Requests   Final    BOTTLES DRAWN AEROBIC AND ANAEROBIC Blood Culture adequate volume   Culture   Final    NO GROWTH 3 DAYS Performed at [facility], [address] [place], [place] [address]    Report Status PENDING  Incomplete  Aerobic/Anaerobic Culture w Gram Stain (surgical/deep wound)     Status: None (Preliminary result)   Collection Time: 09/22/24  4:00 PM   Specimen: Abscess  Result Value Ref Range Status   Specimen Description ABSCESS  Final   Special Requests RIGHT BREAST  Final   Gram Stain   Final     ABUNDANT WBC PRESENT, PREDOMINANTLY PMN NO ORGANISMS SEEN    Culture   Final    MODERATE GRAM NEGATIVE RODS IDENTIFICATION AND SUSCEPTIBILITIES TO FOLLOW Performed at [facility], [address] [place], [place] [address]    Report Status PENDING  Incomplete         Radiology Studies: US  Abscess Drain Result Date: 09/22/2024 INDICATION: Postoperative abscess breast EXAM: Ultrasound-guided aspiration MEDICATIONS: The patient is currently admitted to the hospital and receiving intravenous antibiotics. The antibiotics were administered within an appropriate time frame prior to the initiation of the procedure. ANESTHESIA/SEDATION: Moderate (conscious) sedation was employed during this procedure. Stephaie Dardis total of Versed  2 mg and Fentanyl  100 mcg was administered intravenously by the radiology nurse. Total intra-service moderate Sedation Time: 17 minutes. The patient's level of consciousness and vital signs were monitored continuously by radiology nursing throughout the procedure under my direct supervision. COMPLICATIONS: None immediate. PROCEDURE: Informed written consent was obtained from the patient after Marcelina Mclaurin thorough discussion of the procedural risks, benefits and alternatives. All questions were addressed. Maximal Sterile Barrier Technique was utilized including caps, mask, sterile gowns, sterile gloves, sterile drape, hand hygiene and skin antiseptic. Trelon Plush timeout was performed prior to the initiation of the procedure. In Kiah Vanalstine supine position with head of bed elevated 45 degrees, the right breast was evaluated with ultrasound. Muriel Hannold large complex fluid collection is identified in the outer aspect the 9 o'clock position of the right breast. Patient's skin was prepped and draped in usual sterile fashion. Local anesthesia was achieved with 1% lidocaine . Kadian Barcellos small incision was made o'clock position. Ninamarie Keel 10 cm Yueh needle was then advanced under ultrasound guidance into the collection. Aspiration resulted in  removal of 145 mL reddish-brown material likely representing an infected hematoma. The fluid collection was almost completely reduced. Sterile dressing applied. Swathi Dauphin small sample was sent to laboratory for culture and sensitivity. IMPRESSION: Successful aspiration of 145  mL of fluid from the right breast likely representing infected hematoma. Kutter Schnepf small sample was sent to laboratory for culture and sensitivity. Electronically Signed   By: Cordella Banner   On: 09/22/2024 16:35        Scheduled Meds:  enoxaparin  (LOVENOX ) injection  40 mg Subcutaneous Q24H   ferrous sulfate   325 mg Oral QODAY   ketorolac   15 mg Intravenous Q6H   loratadine   10 mg Oral Daily   polyethylene glycol  17 g Oral BID   Continuous Infusions:  cefTRIAXone  (ROCEPHIN )  IV 2 g (09/23/24 1422)   vancomycin  Stopped (09/24/24 0002)     LOS: 2 days    Time spent: over 30 min    Meliton Monte, MD [facility]   To contact the attending provider between 7A-7P or the covering provider during after hours 7P-7A, please log into the web site [email or web address] and access using universal [place] password for that web site. If you do not have the password, please call the hospital operator.  09/24/2024, 8:56 AM

## 2024-09-24 NOTE — Consult Note (Signed)
 Chief Complaint: Patient was seen in consultation today for  Chief Complaint  Patient presents with   Generalized Body Aches   Chills   Post-op Problem   Referring Physician(s): Estefana Peck, PA-C  Supervising Physician: Philip Cornet  Patient Status: [facility] - In-pt  History of Present Illness: Sandra Wilkerson is a 28 y.o. female with a medical history significant for Crohn's and anemia. She underwent elective bilateral breast reduction surgery 08/21/24 and presented to the [facility] ED 09/21/24 with fevers, chills and body aches. She also reported pain and drainage around the right breast. CT chest showed bilateral fluid collections right > left. IR was consulted and on 09/22/24 Dr. Jenna aspirated 145 ml of fluid from the right breast. Cultures were positive for E. Coli.   The patient is continuing to have fluid draining/weeping from her right breast as well as persistent pain from the left breast. IR was asked to evaluate patient for bilateral breast aspirations/drain placements. An updated right breast ultrasound was obtained for further work up and this showed residual fluid. Imaging reviewed and procedure approved by Dr. Philip.   Past Medical History:  Diagnosis Date   Anemia    Crohn disease (HCC)    PCOS (polycystic ovarian syndrome)     Past Surgical History:  Procedure Laterality Date   APPENDECTOMY     BREAST REDUCTION SURGERY Bilateral 08/21/2024   Procedure: BREAST REDUCTION WITH LIPOSUCTION;  Surgeon: Lowery Estefana RAMAN, DO;  Location: [facility];  Service: Plastics;  Laterality: Bilateral;   CESAREAN SECTION  07/15/2021   Procedure: CESAREAN SECTION;  Surgeon: Janit Alm Agent, MD;  Location: [facility] ORS;  Service: Obstetrics;;   CESAREAN SECTION N/A 05/07/2023   Procedure: REPEAT CESAREAN DELIVERY;  Surgeon: Janit Alm Agent, MD;  Location: [facility] ORS;  Service: Obstetrics;  Laterality: N/A;   CHOLECYSTECTOMY     COLECTOMY     COLOSTOMY      COLOSTOMY REVERSAL     ERCP N/A 11/07/2021   Procedure: ENDOSCOPIC RETROGRADE CHOLANGIOPANCREATOGRAPHY (ERCP);  Surgeon: Jinny Carmine, MD;  Location: [facility] END[facility]COPY;  Service: Endoscopy;  Laterality: N/A;    Allergies: Infliximab , Other, and Vancomycin   Medications: Prior to Admission medications  Medication Sig Start Date End Date Taking? Authorizing Provider  acetaminophen  (TYLENOL ) 500 MG tablet Take 1,000 mg by mouth every 6 (six) hours as needed for mild pain (pain score 1-3) or moderate pain (pain score 4-6).   Yes [provider]  EPINEPHrine  (EPIPEN  2-PAK) 0.3 mg/0.3 mL IJ SOAJ injection Inject 0.3 mg into the muscle as needed for anaphylaxis. 12/16/23  Yes Patel, Sona, MD  folic acid (FOLVITE) 1 MG tablet Take 1 mg by mouth daily. 06/17/24 06/17/25 Yes [provider]  ondansetron  (ZOFRAN ) 4 MG tablet Take 1 tablet (4 mg total) by mouth every 8 (eight) hours as needed for up to 15 doses for nausea or vomiting. 08/04/24  Yes Peck Estefana BRAVO, PA-C  Risankizumab-rzaa (SKYRIZI) 360 MG/2.4ML SOCT Inject 360 mg into the skin. 03/18/24  Yes [provider]  sulfaSALAzine (AZULFIDINE) 500 MG tablet Take 1,000 mg by mouth 2 (two) times daily. 07/14/24  Yes [provider]     Family History  Problem Relation Age of Onset   Healthy Mother    Asthma Father    Healthy Sister    Healthy Sister    Healthy Sister    Healthy Sister    Healthy Sister    Diabetes Maternal Grandmother    Alzheimer's disease  Maternal Grandmother    Healthy Maternal Grandfather    Congestive Heart Failure Paternal Grandmother     Social History   Socioeconomic History   Marital status: Significant Other    Spouse name: Darold   Number of children: 1   Years of education: 16   Highest education level: Not on file  Occupational History   Occupation: childcare  Tobacco Use   Smoking status: Never   Smokeless tobacco: Never  Vaping Use   Vaping status: Never Used   Substance and Sexual Activity   Alcohol use: No   Drug use: No   Sexual activity: Yes    Partners: Male    Birth control/protection: None  Other Topics Concern   Not on file  Social History Narrative   Not on file   Social Drivers of Health   Tobacco Use: Low Risk (09/21/2024)   Patient History    Smoking Tobacco Use: Never    Smokeless Tobacco Use: Never    Passive Exposure: Not on file  Financial Resource Strain: Low Risk (10/05/2022)   Overall Financial Resource Strain (CARDIA)    Difficulty of Paying Living Expenses: Not very hard  Food Insecurity: No Food Insecurity (09/22/2024)   Epic    Worried About Radiation Protection Practitioner of Food in the Last Year: Never true    Ran Out of Food in the Last Year: Never true  Transportation Needs: No Transportation Needs (09/22/2024)   Epic    Lack of Transportation (Medical): No    Lack of Transportation (Non-Medical): No  Physical Activity: Insufficiently Active (10/05/2022)   Exercise Vital Sign    Days of Exercise per Week: 1 day    Minutes of Exercise per Session: 10 min  Stress: No Stress Concern Present (10/05/2022)   [employer] of Occupational Health - Occupational Stress Questionnaire    Feeling of Stress : Not at all  Social Connections: Moderately Integrated (10/05/2022)   Social Connection and Isolation Panel    Frequency of Communication with Friends and Family: More than three times a week    Frequency of Social Gatherings with Friends and Family: Three times a week    Attends Religious Services: More than 4 times per year    Active Member of Clubs or Organizations: No    Attends Banker Meetings: Never    Marital Status: Living with partner  Depression (PHQ2-9): Not on file  Alcohol Screen: Not on file  Housing: Low Risk (09/22/2024)   Epic    Unable to Pay for Housing in the Last Year: No    Number of Times Moved in the Last Year: 0    Homeless in the Last Year: No  Utilities: Not At Risk (09/22/2024)    Epic    Threatened with loss of utilities: No  Health Literacy: Not on file    Review of Systems: A 12 point ROS discussed and pertinent positives are indicated in the HPI above.  All other systems are negative.  Review of Systems  Skin:  Positive for wound.       Bilateral breast pain/tenderness. Right breast redness with serosanguineous fluid draining from under the breast.   All other systems reviewed and are negative.   Vital Signs: BP 130/83 (BP Location: Right Arm)   Pulse 78   Temp 98.2 F (36.8 C) (Oral)   Resp 16   Ht 4' 11 (1.499 m)   Wt 183 lb 3.2 oz (83.1 kg)   SpO2 100%   BMI  37.00 kg/m   Physical Exam Constitutional:      General: She is not in acute distress.    Appearance: She is not ill-appearing.  HENT:     Mouth/Throat:     Mouth: Mucous membranes are moist.     Pharynx: Oropharynx is clear.  Cardiovascular:     Rate and Rhythm: Normal rate.  Pulmonary:     Effort: Pulmonary effort is normal.  Abdominal:     Tenderness: There is no abdominal tenderness.  Skin:    General: Skin is warm and dry.     Comments: Bilateral breast pain/tenderness. Right breast redness with serosanguineous fluid draining from under the breast.  Neurological:     Mental Status: She is alert and oriented to person, place, and time.     Labs:  CBC: Recent Labs    09/21/24 2114 09/22/24 0422 09/23/24 0544 09/24/24 0311  WBC 16.4* 14.6* 10.7* 9.9  HGB 11.5* 9.3* 10.2* 10.2*  HCT 35.7* 29.5* 32.6* 31.6*  PLT 323 270 277 305    COAGS: Recent Labs    12/15/23 1111 09/21/24 2114  INR 1.1 1.1  APTT 27  --     BMP: Recent Labs    09/21/24 2114 09/22/24 0422 09/23/24 0544 09/24/24 0311  NA 135 138 137 135  K 3.1* 3.1* 3.8 3.7  CL 100 107 106 105  CO2 22 24 22 22   GLUCOSE 125* 100* 102* 116*  BUN 9 6 8 8   CALCIUM 8.9 7.6* 8.1* 8.7*  CREATININE 0.79 0.57 0.69 0.52  GFRNONAA >60 >60 >60 >60    LIVER FUNCTION TESTS: Recent Labs     09/21/24 2114 09/22/24 0422 09/23/24 0544 09/24/24 0311  BILITOT 1.2 1.3* 0.5 <0.2  AST 38 22 28 25   ALT 62* 44 57* 52*  ALKPHOS 111 84 123 156*  PROT 7.7 5.6* 6.4* 6.8  ALBUMIN 3.2* 2.4* 2.4* 3.3*    TUMOR MARKERS: No results for input(s): AFPTM, CEA, CA199, CHROMGRNA in the last 8760 hours.  Assessment and Plan:  Bilateral breast fluid collections: Afrah E. Boileau, 28 year old female, is scheduled today for an image-guided bilateral breast fluid collection aspirations with possible drain placements.   Risks and benefits discussed with the patient including bleeding, infection, damage to adjacent structures and sepsis.  All of the patient's questions were answered, patient is agreeable to proceed. She has been NPO since 0800.   Consent signed and in IR.   Thank you for this interesting consult.  I greatly enjoyed meeting Baneza E Logiudice and look forward to participating in their care.  A copy of this report was sent to the requesting provider on this date.  Electronically Signed: Warren Dais, AGACNP-BC 09/24/2024, 1:21 PM   I spent a total of 20 Minutes    in face to face in clinical consultation, greater than 50% of which was counseling/coordinating care for bilateral breast fluid collections.

## 2024-09-24 NOTE — Procedures (Signed)
 Interventional Radiology Procedure:   Indications: Post operative breast fluid collections  Procedure: Ultrasound guided placement of bilateral breast drains  Findings: 10 Fr drains placed bilaterally.  Right breast collection is complex and aspirated 10 ml of thick bloody fluid.  Left breast collection is smaller but more simple, removed 15 ml of amber fluid.    Complications: None     EBL: None  Plan:  Routine flushing and follow outputs   Asante Ritacco R. Philip, MD  Pager: [phone]

## 2024-09-24 NOTE — Progress Notes (Signed)
 JP drain education and care provided to patient with good understanding verbalized.

## 2024-09-24 NOTE — Plan of Care (Signed)
  Problem: Clinical Measurements: Goal: Will remain free from infection Outcome: Progressing   Problem: Pain Managment: Goal: General experience of comfort will improve and/or be controlled Outcome: Progressing   Problem: Skin Integrity: Goal: Risk for impaired skin integrity will decrease Outcome: Progressing

## 2024-09-24 NOTE — Progress Notes (Signed)
 Subjective: Patient is a 28 year old female who underwent bilateral breast reduction with Dr. Lowery on 08/21/2024. Patient presented to [name], ER on 09/21/2024 reporting that she was feeling feverish and complained of bodyaches and chills. Patient was admitted for IV antibiotics and observation. Per chart review, patient had US  guided aspiration of right breast yielding 145 mL of reddish-brown material.   Today, patient reports she is doing okay.  She states that last night, she had a rash/itching from the vancomycin .  She does report she still has a little bit of tenderness to the breast bilaterally, more so on the right.  She otherwise denies any new issues or concerns.  Objective: Vital signs in last 24 hours: Temp:  [97.7 F (36.5 C)-98.9 F (37.2 C)] 98.2 F (36.8 C) (12/17 1009) Pulse Rate:  [54-78] 78 (12/17 1009) Resp:  [15-16] 16 (12/17 1009) BP: (103-130)/(75-95) 130/83 (12/17 1009) SpO2:  [97 %-100 %] 100 % (12/17 1009) Last BM Date : 09/23/24  Intake/Output from previous day: 12/16 0701 - 12/17 0700 In: 480 [P.O.:480] Out: -  Intake/Output this shift: No intake/output data recorded.  General appearance: alert, cooperative, and no distress Resp: Unlabored breathing, no respiratory distress Breasts: Exam is similar to yesterday's.  Breasts are overall soft and symmetric.  There is no overlying erythema to either breast.  There is still some firmness noted to the inferior/lateral breasts similar to previous exam.  NAC's are healing welland incisions are overall intact with the exception of a small wound to the right inframammary incision.  There is still light brown thin drainage coming from this wound.    Lab Results:     Latest Ref Rng & Units 09/24/2024    3:11 AM 09/23/2024    5:44 AM 09/22/2024    4:22 AM  CBC  WBC 4.0 - 10.5 K/uL 9.9  10.7  14.6   Hemoglobin 12.0 - 15.0 g/dL 89.7  89.7  9.3   Hematocrit 36.0 - 46.0 % 31.6  32.6  29.5   Platelets 150  - 400 K/uL 305  277  270     BMET Recent Labs    09/23/24 0544 09/24/24 0311  NA 137 135  K 3.8 3.7  CL 106 105  CO2 22 22  GLUCOSE 102* 116*  BUN 8 8  CREATININE 0.69 0.52  CALCIUM 8.1* 8.7*   PT/INR Recent Labs    09/21/24 2114  LABPROT 15.3*  INR 1.1   ABG No results for input(s): PHART, HCO3 in the last 72 hours.  Invalid input(s): PCO2, PO2  Studies/Results: US  Abscess Drain Result Date: 09/22/2024 INDICATION: Postoperative abscess breast EXAM: Ultrasound-guided aspiration MEDICATIONS: The patient is currently admitted to the hospital and receiving intravenous antibiotics. The antibiotics were administered within an appropriate time frame prior to the initiation of the procedure. ANESTHESIA/SEDATION: Moderate (conscious) sedation was employed during this procedure. A total of Versed  2 mg and Fentanyl  100 mcg was administered intravenously by the radiology nurse. Total intra-service moderate Sedation Time: 17 minutes. The patient's level of consciousness and vital signs were monitored continuously by radiology nursing throughout the procedure under my direct supervision. COMPLICATIONS: None immediate. PROCEDURE: Informed written consent was obtained from the patient after a thorough discussion of the procedural risks, benefits and alternatives. All questions were addressed. Maximal Sterile Barrier Technique was utilized including caps, mask, sterile gowns, sterile gloves, sterile drape, hand hygiene and skin antiseptic. A timeout was performed prior to the initiation of the procedure. In a supine position with head  of bed elevated 45 degrees, the right breast was evaluated with ultrasound. A large complex fluid collection is identified in the outer aspect the 9 o'clock position of the right breast. Patient's skin was prepped and draped in usual sterile fashion. Local anesthesia was achieved with 1% lidocaine . A small incision was made o'clock position. A 10 cm Yueh needle  was then advanced under ultrasound guidance into the collection. Aspiration resulted in removal of 145 mL reddish-brown material likely representing an infected hematoma. The fluid collection was almost completely reduced. Sterile dressing applied. A small sample was sent to laboratory for culture and sensitivity. IMPRESSION: Successful aspiration of 145 mL of fluid from the right breast likely representing infected hematoma. A small sample was sent to laboratory for culture and sensitivity. Electronically Signed   By: Cordella Banner   On: 09/22/2024 16:35    Anti-infectives: Anti-infectives (From admission, onward)    Start     Dose/Rate Route Frequency Ordered Stop   09/24/24 2200  vancomycin  (VANCOREADY) IVPB 1250 mg/250 mL        1,250 mg 125 mL/hr over 120 Minutes Intravenous Every 24 hours 09/24/24 0858     09/22/24 2200  vancomycin  (VANCOREADY) IVPB 1250 mg/250 mL  Status:  Discontinued        1,250 mg 166.7 mL/hr over 90 Minutes Intravenous Every 24 hours 09/22/24 0030 09/24/24 0858   09/22/24 1400  cefTRIAXone  (ROCEPHIN ) 2 g in sodium chloride  0.9 % 100 mL IVPB        2 g 200 mL/hr over 30 Minutes Intravenous Every 24 hours 09/22/24 0705     09/22/24 0045  vancomycin  (VANCOREADY) IVPB 1500 mg/300 mL        1,500 mg 150 mL/hr over 120 Minutes Intravenous Once 09/22/24 0030 09/22/24 0237   09/22/24 0030  ceFEPIme  (MAXIPIME ) 2 g in sodium chloride  0.9 % 100 mL IVPB  Status:  Discontinued        2 g 200 mL/hr over 30 Minutes Intravenous  Once 09/22/24 0020 09/22/24 0025   09/22/24 0030  ceFEPIme  (MAXIPIME ) 2 g in sodium chloride  0.9 % 100 mL IVPB  Status:  Discontinued        2 g 200 mL/hr over 30 Minutes Intravenous Every 8 hours 09/22/24 0025 09/22/24 0705   09/22/24 0000  vancomycin  (VANCOREADY) IVPB 1500 mg/300 mL  Status:  Discontinued        1,500 mg 150 mL/hr over 120 Minutes Intravenous Every 12 hours 09/21/24 2345 09/22/24 0030   09/21/24 2330  vancomycin  (VANCOCIN ) IVPB  1000 mg/200 mL premix  Status:  Discontinued        1,000 mg 200 mL/hr over 60 Minutes Intravenous  Once 09/21/24 2321 09/21/24 2323   09/21/24 2330  Vancomycin  (VANCOCIN ) 1,500 mg in sodium chloride  0.9 % 500 mL IVPB  Status:  Discontinued        1,500 mg 250 mL/hr over 120 Minutes Intravenous  Once 09/21/24 2323 09/21/24 2345       Assessment/Plan: Plan: - Spoke with IR, plan is for them to attempt drain placement on the right side, and perform aspiration and possible drain placement on the left side.  I did discuss this with the patient and she was agreeable with this plan. -Patient's white blood cell count continues to trend down.  Today her WBC is 9.9.  Given reaction to vancomycin , vancomycin  will be stopped.  Cultures showed moderate E. coli which is sensitive to Bactrim .  Discussed this with primary team and  they will switch her to Bactrim . -Discussed with patient to continue with compression at all times. -Plastics will continue to follow -Pain control and medical management per primary  -Discussed today's plan with Dr. Lowery and she was in agreement.   LOS: 2 days    Estefana FORBES Peck, PA-C 09/24/2024

## 2024-09-25 ENCOUNTER — Other Ambulatory Visit (HOSPITAL_COMMUNITY): Payer: Self-pay

## 2024-09-25 ENCOUNTER — Other Ambulatory Visit: Payer: Self-pay | Admitting: Student

## 2024-09-25 DIAGNOSIS — T8142XA Infection following a procedure, deep incisional surgical site, initial encounter: Secondary | ICD-10-CM | POA: Diagnosis not present

## 2024-09-25 LAB — COMPREHENSIVE METABOLIC PANEL WITH GFR
ALT: 44 U/L (ref 0–44)
AST: 23 U/L (ref 15–41)
Albumin: 3.4 g/dL — ABNORMAL LOW (ref 3.5–5.0)
Alkaline Phosphatase: 144 U/L — ABNORMAL HIGH (ref 38–126)
Anion gap: 8 (ref 5–15)
BUN: 10 mg/dL (ref 6–20)
CO2: 25 mmol/L (ref 22–32)
Calcium: 8.9 mg/dL (ref 8.9–10.3)
Chloride: 102 mmol/L (ref 98–111)
Creatinine, Ser: 0.75 mg/dL (ref 0.44–1.00)
GFR, Estimated: >60 mL/min (ref >60)
Glucose, Bld: 98 mg/dL (ref 70–99)
Potassium: 3.9 mmol/L (ref 3.5–5.1)
Sodium: 135 mmol/L (ref 135–145)
Total Bilirubin: <0.2 mg/dL (ref 0.0–1.2)
Total Protein: 7 g/dL (ref 6.5–8.1)

## 2024-09-25 LAB — CBC WITH DIFFERENTIAL/PLATELET
Abs Immature Granulocytes: 0.04 K/uL (ref 0.00–0.07)
Basophils Absolute: 0.1 K/uL (ref 0.0–0.1)
Basophils Relative: 1 %
Eosinophils Absolute: 0.6 K/uL — ABNORMAL HIGH (ref 0.0–0.5)
Eosinophils Relative: 7 %
HCT: 35.9 % — ABNORMAL LOW (ref 36.0–46.0)
Hemoglobin: 11.4 g/dL — ABNORMAL LOW (ref 12.0–15.0)
Immature Granulocytes: 0 %
Lymphocytes Relative: 39 %
Lymphs Abs: 3.5 K/uL (ref 0.7–4.0)
MCH: 26.1 pg (ref 26.0–34.0)
MCHC: 31.8 g/dL (ref 30.0–36.0)
MCV: 82.2 fL (ref 80.0–100.0)
Monocytes Absolute: 0.4 K/uL (ref 0.1–1.0)
Monocytes Relative: 5 %
Neutro Abs: 4.4 K/uL (ref 1.7–7.7)
Neutrophils Relative %: 48 %
Platelets: 372 K/uL (ref 150–400)
RBC: 4.37 MIL/uL (ref 3.87–5.11)
RDW: 13 % (ref 11.5–15.5)
WBC: 9.1 K/uL (ref 4.0–10.5)
nRBC: 0 % (ref 0.0–0.2)

## 2024-09-25 LAB — METHYLMALONIC ACID, SERUM: Methylmalonic Acid, Quantitative: 79 nmol/L (ref 0–378)

## 2024-09-25 LAB — PHOSPHORUS: Phosphorus: 4.2 mg/dL (ref 2.5–4.6)

## 2024-09-25 LAB — MAGNESIUM: Magnesium: 1.7 mg/dL (ref 1.7–2.4)

## 2024-09-25 MED ORDER — VITAMIN B-12 1000 MCG PO TABS
1000.0000 ug | ORAL_TABLET | Freq: Every day | ORAL | 0 refills | Status: AC
  Filled 2024-09-25: qty 30, 30d supply, fill #0

## 2024-09-25 MED ORDER — OXYCODONE HCL 5 MG PO TABS: 5.0000 mg | ORAL_TABLET | Freq: Four times a day (QID) | ORAL | 0 refills | Status: DC | PRN

## 2024-09-25 MED ORDER — OXYCODONE HCL 5 MG PO TABS: 5.0000 mg | ORAL_TABLET | Freq: Four times a day (QID) | ORAL | 0 refills | Status: AC | PRN

## 2024-09-25 MED ORDER — SULFAMETHOXAZOLE-TRIMETHOPRIM 800-160 MG PO TABS
1.0000 | ORAL_TABLET | Freq: Two times a day (BID) | ORAL | 0 refills | Status: AC
  Filled 2024-09-25: qty 28, 14d supply, fill #0

## 2024-09-25 MED ORDER — FERROUS SULFATE 325 (65 FE) MG PO TABS
325.0000 mg | ORAL_TABLET | ORAL | 0 refills | Status: AC
  Filled 2024-09-25: qty 15, 30d supply, fill #0

## 2024-09-25 MED ADMIN — Loratadine Tab 10 MG: 10 mg | ORAL | @ 09:00:00 | NDC 70010016201

## 2024-09-25 NOTE — Progress Notes (Signed)
 Called in oxycodone  earlier today for the patient.  Patient sent MyChart message stating that the [facility] that was sent to is out of the oxycodone .  I called the [facility] and canceled the order for the oxycodone .  Will send oxycodone  to different [facility] pharmacy per patient's request.

## 2024-09-25 NOTE — Discharge Summary (Signed)
 Physician Discharge Summary  Sandra Wilkerson FMW:[id number] DOB: [birth year] DOA: 09/21/2024  PCP: Carin Gauze, NP  Admit date: 09/21/2024 Discharge date: 09/25/2024  Time spent: 40 minutes  Recommendations for Outpatient Follow-up:  Follow outpatient CBC/CMP  Follow with plastics outpatient Iron  def, started on iron  follow outpatient Low normal B12, follow pending MMA and b12 supplementation Needs repeat eosinophils outpatient  Follow management of crohn's per outpatient provider    Discharge Diagnoses:  Principal Problem:   Infection of deep incisional surgical site after procedure, initial encounter   Discharge Condition: stable  Diet recommendation: heart healthy  Filed Weights   09/21/24 2055  Weight: 83.1 kg    History of present illness:   Sandra Wilkerson is Sandra Wilkerson 28 y.o. female with medical history significant for obesity, symptomatic mammary hypertrophy, who presents to the ER due to bilateral breast pain and swelling.  The patient recently had bilateral breast reduction with Dr. Lowery on 08/21/2024.   Presented with post op infections.  Now s/p IV abx and bilateral drain placement.  Discharging on bactrim  for culture with e. Coli.    Hospital Course:  Assessment and Plan:  Sepsis due to Post Operative Infection Rules in with fever, tachycardia, leukocytosis, tachypnea + concern for post op infection CT with bilateral peripherally enhancing fluid collections in the area of prior surgery UA contaminated with squams Blood cultures Ngx2 S/p IR drainage 12/15 - culture with e. Coli sensitive to bactrim  Will discharge with 14 days bactrim  Appreciate plastic surgery consult - now s/p bilateral breast drains.  Stable for discharge from plastics standpoint.    Vancomycin  Infusion Reaction With gram negative rods on culture, will consider discontinuation - will discuss with plastics Discussed with pharmacy, plan on otherwise slowing infusion rate Note  eosinophils - will trend - follow outpatient   Anemia Iron  Def Low iron , %sat, normal ferritin.  Low normal b12, normal folate. Supplement iron .  Follow MMA and supplement b12.   Crohn's Disease Her skyrizi and sulfasalazine currently on hold, she'll need to follow with outpatient docs regarding timing of resumption  Hypokalemia Resolved  Hypophosphatemia resolved   Elevated ALT Mildly Elevated Bilirubin resolved   Pruritus Thought related to vanc - plan for infusion to be slowed   Obesity Body mass index is 37 kg/m.     Procedures: 12/15 Successful aspiration of 145 mL of fluid from the right breast likely representing infected hematoma. Sandra Wilkerson small sample was sent to laboratory for culture and sensitivity.  12/17 IMPRESSION: 1. Successful placement of bilateral breast drains using ultrasound guidance.  Consultations: Plastics IR  Discharge Exam: Vitals:   09/25/24 0006 09/25/24 0509  BP: 125/79 116/79  Pulse: 65 72  Resp:    Temp: 98.1 F (36.7 C) 98.2 F (36.8 C)  SpO2: 98% 97%   No complaints  General: No acute distress. Lungs: unlabored Deferred breast exam as plastics has already seen her today - R drain with serosanguinous output, left drain with clear brown output.  Neurological: Alert and oriented 3. Moves all extremities 4 with equal strength. Cranial nerves II through XII grossly intact. Extremities: No clubbing or cyanosis. No edema.   Discharge Instructions   Discharge Instructions     Call MD for:  difficulty breathing, headache or visual disturbances   Complete by: As directed    Call MD for:  extreme fatigue   Complete by: As directed    Call MD for:  hives   Complete by: As directed    Call MD  for:  persistant dizziness or light-headedness   Complete by: As directed    Call MD for:  persistant nausea and vomiting   Complete by: As directed    Call MD for:  redness, tenderness, or signs of infection (pain, swelling, redness,  odor or green/yellow discharge around incision site)   Complete by: As directed    Call MD for:  severe uncontrolled pain   Complete by: As directed    Call MD for:  temperature >100.4   Complete by: As directed    Discharge instructions   Complete by: As directed    You were seen for Sandra Wilkerson post op infection.  You've improved with antibiotics and have had drains placed.  Follow up with plastic surgery outpatient as scheduled.    Plastic surgery sent in some oxycodone  you can use as needed for uncontrolled pain.  Use tylenol  and ibuprofen  first.  For uncontrolled pain, use oxycodone .  If you have worsening or uncontrolled pain, return to care for Sandra Wilkerson reevaluation.   Return for new, recurrent, or worsening symptoms.  Please ask your PCP to request records from this hospitalization so they know what was done and what the next steps will be.   Discharge wound care:   Complete by: As directed    Per plastic surgery   Increase activity slowly   Complete by: As directed       Allergies as of 09/25/2024       Reactions   Infliximab  Anaphylaxis   REMICADE :  chest pressure, itching, and cough   Other Anaphylaxis   Applies to iron  infusions only. Patient can use oral iron  supplements.   Vancomycin  Itching   Patient became itchy during infusion > infusion slowed down and oral benadryl  given         Medication List     TAKE these medications    acetaminophen  500 MG tablet Commonly known as: TYLENOL  Take 1,000 mg by mouth every 6 (six) hours as needed for mild pain (pain score 1-3) or moderate pain (pain score 4-6).   EPINEPHrine  0.3 mg/0.3 mL Soaj injection Commonly known as: EpiPen  2-Pak Inject 0.3 mg into the muscle as needed for anaphylaxis.   ferrous sulfate  325 (65 FE) MG tablet Take 1 tablet (325 mg total) by mouth every other day. Follow iron  studies and hemoglobin outpatient. Start taking on: September 27, 2024   folic acid 1 MG tablet Commonly known as: FOLVITE Take 1 mg  by mouth daily.   ondansetron  4 MG tablet Commonly known as: Zofran  Take 1 tablet (4 mg total) by mouth every 8 (eight) hours as needed for up to 15 doses for nausea or vomiting.   oxyCODONE  5 MG immediate release tablet Commonly known as: Roxicodone  Take 1 tablet (5 mg total) by mouth every 6 (six) hours as needed for up to 8 doses for severe pain (pain score 7-10).   Skyrizi 360 MG/2.4ML Soct Generic drug: Risankizumab-rzaa Inject 360 mg into the skin.   sulfamethoxazole -trimethoprim  800-160 MG tablet Commonly known as: BACTRIM  DS Take 1 tablet by mouth every 12 (twelve) hours for 14 days. Follow plastic surgery instructions on antibiotics as an outpatient.   sulfaSALAzine 500 MG tablet Commonly known as: AZULFIDINE Take 1,000 mg by mouth 2 (two) times daily.               Discharge Care Instructions  (From admission, onward)           Start     Ordered  09/25/24 0000  Discharge wound care:       Comments: Per plastic surgery   09/25/24 0955           Allergies[1]    The results of significant diagnostics from this hospitalization (including imaging, microbiology, ancillary and laboratory) are listed below for reference.    Significant Diagnostic Studies: IR US  ABSCESS DRAIN PLACEMENT Result Date: 09/24/2024 INDICATION: 28 year old with history of bilateral breast reduction surgeries. Recent ultrasound-guided aspiration of the right breast collection. Patient continues to have drainage from the right breast. Previous CT demonstrated bilateral breast fluid collections. EXAM: 1. Ultrasound-guided placement of drain in right breast fluid collection 2. Ultrasound-guided placement of drain in left breast fluid collection MEDICATIONS: Moderate sedation ANESTHESIA/SEDATION: Moderate (conscious) sedation was employed during this procedure. Lorrena Goranson total of Versed  4 mg and Fentanyl  150 mcg was administered intravenously by the radiology nurse. Total intra-service moderate  Sedation Time: 38 minutes. The patient's level of consciousness and vital signs were monitored continuously by radiology nursing throughout the procedure under my direct supervision. COMPLICATIONS: None immediate. PROCEDURE: Informed written consent was obtained from the patient after Wilmer Berryhill thorough discussion of the procedural risks, benefits and alternatives. All questions were addressed. Maximal Sterile Barrier Technique was utilized including caps, mask, sterile gowns, sterile gloves, sterile drape, hand hygiene and skin antiseptic. Berit Raczkowski timeout was performed prior to the initiation of the procedure. Bilateral breast were evaluated with ultrasound. Complex fluid collection was identified in the inferolateral right breast tissue. Right breast was prepped and draped in sterile fashion. Skin was anesthetized with 1% lidocaine . Small incision was made. Needle was directed into the fluid collection using ultrasound guidance. Thick blood tinged fluid was aspirated. Superstiff Amplatz wire was advanced into the collection under ultrasound guidance. Tract was dilated to accommodate Harry Shuck 10 French multipurpose drain. Approximately 10 mL of thick bloody fluid was aspirated after flushing. Drain was sutured to skin and attached to Tomekia Helton suction bulb. Dressing was placed. Ultrasound demonstrated Gamaliel Charney smaller fluid collection in the inferior and lateral left breast. Left breast was prepped and draped in sterile fashion. Skin was anesthetized with 1% lidocaine . Small incision was made. Using ultrasound guidance, needle was directed into the collection and amber colored fluid was aspirated. Superstiff Amplatz wire was placed with ultrasound guidance. Tract was dilated to accommodate Machel Violante 10 French multipurpose drain. 15 mL of fluid was removed. Drain was attached to Reginia Battie suction bulb and sutured to skin. Dressing was placed. FINDINGS: Heterogeneous complex collection in the inferior right breast tissue. Wire and catheter coiled within the collection  but only small amount of thick bloody fluid was aspirated. Left inferior breast collection is much smaller than the right side but the fluid appeared more simple. 15 mL of fluid was aspirated from this collection. IMPRESSION: 1. Successful placement of bilateral breast drains using ultrasound guidance. Electronically Signed   By: Juliene Balder M.D.   On: 09/24/2024 16:12   IR US  ABSCESS DRAIN PLACEMENT Result Date: 09/24/2024 INDICATION: 28 year old with history of bilateral breast reduction surgeries. Recent ultrasound-guided aspiration of the right breast collection. Patient continues to have drainage from the right breast. Previous CT demonstrated bilateral breast fluid collections. EXAM: 1. Ultrasound-guided placement of drain in right breast fluid collection 2. Ultrasound-guided placement of drain in left breast fluid collection MEDICATIONS: Moderate sedation ANESTHESIA/SEDATION: Moderate (conscious) sedation was employed during this procedure. Sriyan Cutting total of Versed  4 mg and Fentanyl  150 mcg was administered intravenously by the radiology nurse. Total intra-service moderate Sedation Time: 38 minutes. The  patient's level of consciousness and vital signs were monitored continuously by radiology nursing throughout the procedure under my direct supervision. COMPLICATIONS: None immediate. PROCEDURE: Informed written consent was obtained from the patient after Cleon Thoma thorough discussion of the procedural risks, benefits and alternatives. All questions were addressed. Maximal Sterile Barrier Technique was utilized including caps, mask, sterile gowns, sterile gloves, sterile drape, hand hygiene and skin antiseptic. Kendra Grissett timeout was performed prior to the initiation of the procedure. Bilateral breast were evaluated with ultrasound. Complex fluid collection was identified in the inferolateral right breast tissue. Right breast was prepped and draped in sterile fashion. Skin was anesthetized with 1% lidocaine . Small incision was  made. Needle was directed into the fluid collection using ultrasound guidance. Thick blood tinged fluid was aspirated. Superstiff Amplatz wire was advanced into the collection under ultrasound guidance. Tract was dilated to accommodate Anvika Gashi 10 French multipurpose drain. Approximately 10 mL of thick bloody fluid was aspirated after flushing. Drain was sutured to skin and attached to Sonali Wivell suction bulb. Dressing was placed. Ultrasound demonstrated Kimarion Chery smaller fluid collection in the inferior and lateral left breast. Left breast was prepped and draped in sterile fashion. Skin was anesthetized with 1% lidocaine . Small incision was made. Using ultrasound guidance, needle was directed into the collection and amber colored fluid was aspirated. Superstiff Amplatz wire was placed with ultrasound guidance. Tract was dilated to accommodate Brianah Hopson 10 French multipurpose drain. 15 mL of fluid was removed. Drain was attached to Cieanna Stormes suction bulb and sutured to skin. Dressing was placed. FINDINGS: Heterogeneous complex collection in the inferior right breast tissue. Wire and catheter coiled within the collection but only small amount of thick bloody fluid was aspirated. Left inferior breast collection is much smaller than the right side but the fluid appeared more simple. 15 mL of fluid was aspirated from this collection. IMPRESSION: 1. Successful placement of bilateral breast drains using ultrasound guidance. Electronically Signed   By: Juliene Balder M.D.   On: 09/24/2024 16:12   US  Abscess Drain Result Date: 09/22/2024 INDICATION: Postoperative abscess breast EXAM: Ultrasound-guided aspiration MEDICATIONS: The patient is currently admitted to the hospital and receiving intravenous antibiotics. The antibiotics were administered within an appropriate time frame prior to the initiation of the procedure. ANESTHESIA/SEDATION: Moderate (conscious) sedation was employed during this procedure. Forbes Loll total of Versed  2 mg and Fentanyl  100 mcg was  administered intravenously by the radiology nurse. Total intra-service moderate Sedation Time: 17 minutes. The patient's level of consciousness and vital signs were monitored continuously by radiology nursing throughout the procedure under my direct supervision. COMPLICATIONS: None immediate. PROCEDURE: Informed written consent was obtained from the patient after Blakely Gluth thorough discussion of the procedural risks, benefits and alternatives. All questions were addressed. Maximal Sterile Barrier Technique was utilized including caps, mask, sterile gowns, sterile gloves, sterile drape, hand hygiene and skin antiseptic. Kassidy Frankson timeout was performed prior to the initiation of the procedure. In Knoah Nedeau supine position with head of bed elevated 45 degrees, the right breast was evaluated with ultrasound. Eula Jaster large complex fluid collection is identified in the outer aspect the 9 o'clock position of the right breast. Patient's skin was prepped and draped in usual sterile fashion. Local anesthesia was achieved with 1% lidocaine . Odalis Jordan small incision was made o'clock position. Fabrizzio Marcella 10 cm Yueh needle was then advanced under ultrasound guidance into the collection. Aspiration resulted in removal of 145 mL reddish-brown material likely representing an infected hematoma. The fluid collection was almost completely reduced. Sterile dressing applied. Natallie Ravenscroft small sample was  sent to laboratory for culture and sensitivity. IMPRESSION: Successful aspiration of 145 mL of fluid from the right breast likely representing infected hematoma. Jaquita Bessire small sample was sent to laboratory for culture and sensitivity. Electronically Signed   By: Cordella Banner   On: 09/22/2024 16:35   CT Chest W Contrast Result Date: 09/21/2024 EXAM: CT CHEST WITH CONTRAST 09/21/2024 11:06:00 PM TECHNIQUE: CT of the chest was performed with the administration of 75 mL of iohexol  (OMNIPAQUE ) 350 MG/ML injection. Multiplanar reformatted images are provided for review. Automated exposure control,  iterative reconstruction, and/or weight based adjustment of the mA/kV was utilized to reduce the radiation dose to as low as reasonably achievable. COMPARISON: 12/15/2023 CLINICAL HISTORY: Sepsis, recent breast reduction, eval for abscess. FINDINGS: MEDIASTINUM: Heart and pericardium are unremarkable. The central airways are clear. The thoracic aorta is within normal limits. 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 The esophagus is within normal limits. LYMPH NODES: No mediastinal, hilar or axillary lymphadenopathy. LUNGS AND PLEURA: The lungs are well aerated bilaterally. No focal infiltrate or pulmonary edema. No pleural effusion or pneumothorax. No evidence of pulmonary embolism is seen, although the study is not timed for embolus evaluation. SOFT TISSUES/BONES: No acute bony abnormality is noted. There are changes consistent with the given clinical history of bilateral breast reduction. There is Shunte Senseney mildly enhancing fluid collection identified near the inferior aspect of the right breast measuring 7.2 x 5.5 cm. This extends for approximately 11 cm in craniocaudal projection. Navia Lindahl similar but smaller fluid collection is noted on the left measuring 4.5 x 3.2 cm and extending for at least 7 cm in craniocaudal projection. UPPER ABDOMEN: Limited images of the upper abdomen demonstrates no acute abnormality. Visualized upper abdomen is unremarkable. IMPRESSION: 1. Bilateral peripherally enhancing fluid collections in the area of prior surgery, right greater than left. Although these may simply represent postoperative seromas, the possibility of developing abscess deserves consideration. Fluid sampling/drainage is recommended. Electronically signed by: Oneil Devonshire MD 09/21/2024 11:15 PM EST RP Workstation: [id number]    Microbiology: Recent Results (from the past 240 hours)  Culture, blood (Routine x 2)     Status: None (Preliminary result)   Collection Time: 09/21/24   9:00 PM   Specimen: BLOOD LEFT HAND  Result Value Ref Range Status   Specimen Description BLOOD LEFT HAND  Final   Special Requests   Final    BOTTLES DRAWN AEROBIC AND ANAEROBIC Blood Culture results may not be optimal due to an inadequate volume of blood received in culture bottles   Culture   Final    NO GROWTH 4 DAYS Performed at [facility], [address] [place], [place] [address]    Report Status PENDING  Incomplete  Culture, blood (Routine x 2)     Status: None (Preliminary result)   Collection Time: 09/21/24  9:14 PM   Specimen: BLOOD LEFT HAND  Result Value Ref Range Status   Specimen Description BLOOD LEFT HAND  Final   Special Requests   Final    BOTTLES DRAWN AEROBIC AND ANAEROBIC Blood Culture adequate volume   Culture   Final    NO GROWTH 4 DAYS Performed at [facility], [address] [place], [place] [address]    Report Status PENDING  Incomplete  Aerobic/Anaerobic Culture w Gram Stain (surgical/deep wound)     Status: None (Preliminary result)   Collection Time: 09/22/24  4:00 PM   Specimen: Abscess  Result  Value Ref Range Status   Specimen Description ABSCESS  Final   Special Requests RIGHT BREAST  Final   Gram Stain   Final    ABUNDANT WBC PRESENT, PREDOMINANTLY PMN NO ORGANISMS SEEN Performed at [facility], [address], [place], [place] [address]    Culture   Final    MODERATE ESCHERICHIA COLI NO ANAEROBES ISOLATED; CULTURE IN PROGRESS FOR 5 DAYS    Report Status PENDING  Incomplete   Organism ID, Bacteria ESCHERICHIA COLI  Final      Susceptibility   Escherichia coli - MIC*    AMPICILLIN  >=32 RESISTANT Resistant     CEFAZOLIN  (NON-URINE) 4 INTERMEDIATE Intermediate     CEFEPIME  <=0.12 SENSITIVE Sensitive     ERTAPENEM <=0.12 SENSITIVE Sensitive     CEFTRIAXONE  <=0.25 SENSITIVE Sensitive     CIPROFLOXACIN >=4 RESISTANT Resistant     GENTAMICIN >=16 RESISTANT Resistant     MEROPENEM <=0.25 SENSITIVE Sensitive      TRIMETH /SULFA  <=20 SENSITIVE Sensitive     AMPICILLIN /SULBACTAM 16 INTERMEDIATE Intermediate     PIP/TAZO Value in next row Sensitive      <=4 SENSITIVEThis is Maxyne Derocher modified FDA-approved test that has been validated and its performance characteristics determined by the reporting laboratory.  This laboratory is certified under the Clinical Laboratory Improvement Amendments CLIA as qualified to perform high complexity clinical laboratory testing.    * MODERATE ESCHERICHIA COLI     Labs: Basic Metabolic Panel: Recent Labs  Lab 09/21/24 2114 09/22/24 0422 09/23/24 0544 09/24/24 0311 09/25/24 0201  NA 135 138 137 135 135  K 3.1* 3.1* 3.8 3.7 3.9  CL 100 107 106 105 102  CO2 22 24 22 22 25   GLUCOSE 125* 100* 102* 116* 98  BUN 9 6 8 8 10   CREATININE 0.79 0.57 0.69 0.52 0.75  CALCIUM 8.9 7.6* 8.1* 8.7* 8.9  MG  --  1.4* 1.7 1.8 1.7  PHOS  --  2.2* 4.1 2.9 4.2   Liver Function Tests: Recent Labs  Lab 09/21/24 2114 09/22/24 0422 09/23/24 0544 09/24/24 0311 09/25/24 0201  AST 38 22 28 25 23   ALT 62* 44 57* 52* 44  ALKPHOS 111 84 123 156* 144*  BILITOT 1.2 1.3* 0.5 <0.2 <0.2  PROT 7.7 5.6* 6.4* 6.8 7.0  ALBUMIN 3.2* 2.4* 2.4* 3.3* 3.4*   No results for input(s): LIPASE, AMYLASE in the last 168 hours. No results for input(s): AMMONIA  in the last 168 hours. CBC: Recent Labs  Lab 09/21/24 2114 09/22/24 0422 09/23/24 0544 09/24/24 0311 09/25/24 0201  WBC 16.4* 14.6* 10.7* 9.9 9.1  NEUTROABS 12.4* 10.6* 6.5 5.1 4.4  HGB 11.5* 9.3* 10.2* 10.2* 11.4*  HCT 35.7* 29.5* 32.6* 31.6* 35.9*  MCV 81.9 81.9 83.8 82.5 82.2  PLT 323 270 277 305 372   Cardiac Enzymes: No results for input(s): CKTOTAL, CKMB, CKMBINDEX, TROPONINI in the last 168 hours. BNP: BNP (last 3 results) Recent Labs    12/15/23 1023  BNP 18.2    ProBNP (last 3 results) No results for input(s): PROBNP in the last 8760 hours.  CBG: No results for input(s): GLUCAP in the last 168  hours.     Signed:  Meliton Monte MD.  [facility] 09/25/2024, 9:58 AM       [1]  Allergies Allergen Reactions   Infliximab  Anaphylaxis    REMICADE :  chest pressure, itching, and cough   Other Anaphylaxis    Applies to iron  infusions only. Patient can use oral  iron  supplements.   Vancomycin  Itching    Patient became itchy during infusion > infusion slowed down and oral benadryl  given

## 2024-09-25 NOTE — Progress Notes (Signed)
 Discharge education and discharge instructions provided to and discussed with patient. Patient agreeable to discharge. IV removed. Patient's family en route to pick patient up and transport home.

## 2024-09-25 NOTE — Progress Notes (Signed)
 Pain medication s/p hospitalization - discussed this with patient

## 2024-09-25 NOTE — Progress Notes (Cosign Needed Addendum)
 Subjective: Patient is a 28 year old female who underwent bilateral breast reduction with Dr. Lowery on 08/21/2024. Patient presented to [name], ER on 09/21/2024 reporting that she was feeling feverish and complained of bodyaches and chills. Patient was admitted for IV antibiotics and observation.   Patient underwent ultrasound-guided placement of bilateral breast drains with interventional radiology yesterday.  Today, patient is sitting in bed in no acute distress.  She reports that she has been overall doing well.  She reported she had a little bit of discomfort after her drain placement, but this has been improving.  She denies any acute events overnight.  She reports that she has been up and ambulating, and eating and drinking without issue.  Patient states that she is ready to go home.  Objective: Vital signs in last 24 hours: Temp:  [98.1 F (36.7 C)-98.3 F (36.8 C)] 98.2 F (36.8 C) (12/18 0509) Pulse Rate:  [65-93] 72 (12/18 0509) Resp:  [12-21] 16 (12/17 1744) BP: (110-155)/(79-104) 116/79 (12/18 0509) SpO2:  [97 %-100 %] 97 % (12/18 0509) Last BM Date : 09/23/24  Intake/Output from previous day: 12/17 0701 - 12/18 0700 In: 10 [I.V.:10] Out: 23 [Drains:23] Intake/Output this shift: No intake/output data recorded.  General appearance: alert, cooperative, and no distress Resp: Unlabored breathing, no respiratory distress Breasts: Breasts are soft and symmetric.  There is still a little bit of firmness consistent with residual hematoma noted to the inferior lateral breast bilaterally, but this appears to be significantly improved.  There is no overlying erythema.  There is no induration or crepitus noted on exam.  NAC's appear to be healing well. Incisions are overall healing well, there is a small wound to the right lateral inframammary incision.  There is no active drainage from this wound today.  Lab Results:     Latest Ref Rng & Units 09/25/2024    2:01 AM  09/24/2024    3:11 AM 09/23/2024    5:44 AM  CBC  WBC 4.0 - 10.5 K/uL 9.1  9.9  10.7   Hemoglobin 12.0 - 15.0 g/dL 88.5  89.7  89.7   Hematocrit 36.0 - 46.0 % 35.9  31.6  32.6   Platelets 150 - 400 K/uL 372  305  277     BMET Recent Labs    09/24/24 0311 09/25/24 0201  NA 135 135  K 3.7 3.9  CL 105 102  CO2 22 25  GLUCOSE 116* 98  BUN 8 10  CREATININE 0.52 0.75  CALCIUM 8.7* 8.9   PT/INR No results for input(s): LABPROT, INR in the last 72 hours. ABG No results for input(s): PHART, HCO3 in the last 72 hours.  Invalid input(s): PCO2, PO2  Studies/Results: IR US  ABSCESS DRAIN PLACEMENT Result Date: 09/24/2024 INDICATION: 28 year old with history of bilateral breast reduction surgeries. Recent ultrasound-guided aspiration of the right breast collection. Patient continues to have drainage from the right breast. Previous CT demonstrated bilateral breast fluid collections. EXAM: 1. Ultrasound-guided placement of drain in right breast fluid collection 2. Ultrasound-guided placement of drain in left breast fluid collection MEDICATIONS: Moderate sedation ANESTHESIA/SEDATION: Moderate (conscious) sedation was employed during this procedure. A total of Versed  4 mg and Fentanyl  150 mcg was administered intravenously by the radiology nurse. Total intra-service moderate Sedation Time: 38 minutes. The patient's level of consciousness and vital signs were monitored continuously by radiology nursing throughout the procedure under my direct supervision. COMPLICATIONS: None immediate. PROCEDURE: Informed written consent was obtained from the patient after a thorough discussion of  the procedural risks, benefits and alternatives. All questions were addressed. Maximal Sterile Barrier Technique was utilized including caps, mask, sterile gowns, sterile gloves, sterile drape, hand hygiene and skin antiseptic. A timeout was performed prior to the initiation of the procedure. Bilateral breast were  evaluated with ultrasound. Complex fluid collection was identified in the inferolateral right breast tissue. Right breast was prepped and draped in sterile fashion. Skin was anesthetized with 1% lidocaine . Small incision was made. Needle was directed into the fluid collection using ultrasound guidance. Thick blood tinged fluid was aspirated. Superstiff Amplatz wire was advanced into the collection under ultrasound guidance. Tract was dilated to accommodate a 10 French multipurpose drain. Approximately 10 mL of thick bloody fluid was aspirated after flushing. Drain was sutured to skin and attached to a suction bulb. Dressing was placed. Ultrasound demonstrated a smaller fluid collection in the inferior and lateral left breast. Left breast was prepped and draped in sterile fashion. Skin was anesthetized with 1% lidocaine . Small incision was made. Using ultrasound guidance, needle was directed into the collection and amber colored fluid was aspirated. Superstiff Amplatz wire was placed with ultrasound guidance. Tract was dilated to accommodate a 10 French multipurpose drain. 15 mL of fluid was removed. Drain was attached to a suction bulb and sutured to skin. Dressing was placed. FINDINGS: Heterogeneous complex collection in the inferior right breast tissue. Wire and catheter coiled within the collection but only small amount of thick bloody fluid was aspirated. Left inferior breast collection is much smaller than the right side but the fluid appeared more simple. 15 mL of fluid was aspirated from this collection. IMPRESSION: 1. Successful placement of bilateral breast drains using ultrasound guidance. Electronically Signed   By: Juliene Balder M.D.   On: 09/24/2024 16:12   IR US  ABSCESS DRAIN PLACEMENT Result Date: 09/24/2024 INDICATION: 28 year old with history of bilateral breast reduction surgeries. Recent ultrasound-guided aspiration of the right breast collection. Patient continues to have drainage from the right  breast. Previous CT demonstrated bilateral breast fluid collections. EXAM: 1. Ultrasound-guided placement of drain in right breast fluid collection 2. Ultrasound-guided placement of drain in left breast fluid collection MEDICATIONS: Moderate sedation ANESTHESIA/SEDATION: Moderate (conscious) sedation was employed during this procedure. A total of Versed  4 mg and Fentanyl  150 mcg was administered intravenously by the radiology nurse. Total intra-service moderate Sedation Time: 38 minutes. The patient's level of consciousness and vital signs were monitored continuously by radiology nursing throughout the procedure under my direct supervision. COMPLICATIONS: None immediate. PROCEDURE: Informed written consent was obtained from the patient after a thorough discussion of the procedural risks, benefits and alternatives. All questions were addressed. Maximal Sterile Barrier Technique was utilized including caps, mask, sterile gowns, sterile gloves, sterile drape, hand hygiene and skin antiseptic. A timeout was performed prior to the initiation of the procedure. Bilateral breast were evaluated with ultrasound. Complex fluid collection was identified in the inferolateral right breast tissue. Right breast was prepped and draped in sterile fashion. Skin was anesthetized with 1% lidocaine . Small incision was made. Needle was directed into the fluid collection using ultrasound guidance. Thick blood tinged fluid was aspirated. Superstiff Amplatz wire was advanced into the collection under ultrasound guidance. Tract was dilated to accommodate a 10 French multipurpose drain. Approximately 10 mL of thick bloody fluid was aspirated after flushing. Drain was sutured to skin and attached to a suction bulb. Dressing was placed. Ultrasound demonstrated a smaller fluid collection in the inferior and lateral left breast. Left breast was prepped and draped  in sterile fashion. Skin was anesthetized with 1% lidocaine . Small incision was made.  Using ultrasound guidance, needle was directed into the collection and amber colored fluid was aspirated. Superstiff Amplatz wire was placed with ultrasound guidance. Tract was dilated to accommodate a 10 French multipurpose drain. 15 mL of fluid was removed. Drain was attached to a suction bulb and sutured to skin. Dressing was placed. FINDINGS: Heterogeneous complex collection in the inferior right breast tissue. Wire and catheter coiled within the collection but only small amount of thick bloody fluid was aspirated. Left inferior breast collection is much smaller than the right side but the fluid appeared more simple. 15 mL of fluid was aspirated from this collection. IMPRESSION: 1. Successful placement of bilateral breast drains using ultrasound guidance. Electronically Signed   By: Juliene Balder M.D.   On: 09/24/2024 16:12    Anti-infectives: Anti-infectives (From admission, onward)    Start     Dose/Rate Route Frequency Ordered Stop   09/24/24 2200  vancomycin  (VANCOREADY) IVPB 1250 mg/250 mL  Status:  Discontinued        1,250 mg 125 mL/hr over 120 Minutes Intravenous Every 24 hours 09/24/24 0858 09/24/24 1138   09/24/24 1230  sulfamethoxazole -trimethoprim  (BACTRIM  DS) 800-160 MG per tablet 1 tablet        1 tablet Oral Every 12 hours 09/24/24 1138     09/22/24 2200  vancomycin  (VANCOREADY) IVPB 1250 mg/250 mL  Status:  Discontinued        1,250 mg 166.7 mL/hr over 90 Minutes Intravenous Every 24 hours 09/22/24 0030 09/24/24 0858   09/22/24 1400  cefTRIAXone  (ROCEPHIN ) 2 g in sodium chloride  0.9 % 100 mL IVPB  Status:  Discontinued        2 g 200 mL/hr over 30 Minutes Intravenous Every 24 hours 09/22/24 0705 09/24/24 1138   09/22/24 0045  vancomycin  (VANCOREADY) IVPB 1500 mg/300 mL        1,500 mg 150 mL/hr over 120 Minutes Intravenous Once 09/22/24 0030 09/22/24 0237   09/22/24 0030  ceFEPIme  (MAXIPIME ) 2 g in sodium chloride  0.9 % 100 mL IVPB  Status:  Discontinued        2 g 200 mL/hr  over 30 Minutes Intravenous  Once 09/22/24 0020 09/22/24 0025   09/22/24 0030  ceFEPIme  (MAXIPIME ) 2 g in sodium chloride  0.9 % 100 mL IVPB  Status:  Discontinued        2 g 200 mL/hr over 30 Minutes Intravenous Every 8 hours 09/22/24 0025 09/22/24 0705   09/22/24 0000  vancomycin  (VANCOREADY) IVPB 1500 mg/300 mL  Status:  Discontinued        1,500 mg 150 mL/hr over 120 Minutes Intravenous Every 12 hours 09/21/24 2345 09/22/24 0030   09/21/24 2330  vancomycin  (VANCOCIN ) IVPB 1000 mg/200 mL premix  Status:  Discontinued        1,000 mg 200 mL/hr over 60 Minutes Intravenous  Once 09/21/24 2321 09/21/24 2323   09/21/24 2330  Vancomycin  (VANCOCIN ) 1,500 mg in sodium chloride  0.9 % 500 mL IVPB  Status:  Discontinued        1,500 mg 250 mL/hr over 120 Minutes Intravenous  Once 09/21/24 2323 09/21/24 2345       Assessment/Plan: Plan: -Patient overall doing well after bilateral breast drain placement.  Her WBC continues to trend downwards.  -Recommend that patient continue with Bactrim  for at least 1 week. -Discussed with the patient to continue with compression at all times and avoid strenuous activities.  Discussed  with her to continue daily Xeroform changes to her NAC's bilaterally and to continue with Vaseline to her incisions into the wound to the right lateral inframammary incision.  Patient expressed understanding. -Discussed with patient to not get the drain sites wet. She expressed understanding -Discussed with the patient that she may take Tylenol  and ibuprofen  for her pain.  Will send in some oxycodone  for her to take as needed as well. - Patient may be discharged from a plastic surgery standpoint. - Medical management and discharge planning per primary team.   LOS: 3 days    Estefana FORBES Peck, PA-C 09/25/2024

## 2024-09-26 ENCOUNTER — Encounter: Admitting: Student

## 2024-09-26 LAB — CULTURE, BLOOD (ROUTINE X 2)
Culture: NO GROWTH
Culture: NO GROWTH
Special Requests: ADEQUATE

## 2024-09-27 LAB — AEROBIC/ANAEROBIC CULTURE W GRAM STAIN (SURGICAL/DEEP WOUND)

## 2024-09-29 ENCOUNTER — Ambulatory Visit: Admitting: Student

## 2024-09-29 DIAGNOSIS — Z9889 Other specified postprocedural states: Secondary | ICD-10-CM

## 2024-09-29 NOTE — Progress Notes (Signed)
 Patient is a 28 year old female who recently had bilateral breast reduction with amputation technique with Dr. Lowery on 08/21/2024.  She was recently hospitalized for postoperative infection.  Patient had bilateral drains placed on 09/24/2024.  Patient presents to the clinic today for follow-up.     Today, patient presents with her mother at bedside.  Patient reports that since she has been discharged from the hospital, she is overall has been doing really well.  She states that she has been taking the Bactrim .  She does report that she had 1 incidence of itchiness.  She denies any sensation of her throat closing, difficulty breathing or itching of her throat.  She states that she has taken Bactrim  several times since this incident and has not had any issues.  She denies any issues with the Bactrim  prior to this incidence. She denies any fevers or chills.  She denies any other issues or concerns at this time.  She reports that the drains have been putting out less than 10 cc/day.  Chaperone present on exam.  On exam, patient is sitting upright in no acute distress.  Breasts are overall soft and symmetric.  There is no overlying erythema to either breast.  They seem to have softened up significantly.  NAC's appear to be healing well.  Incisions are intact and healing well.  Drains are in place bilaterally with minimal serosanguineous drainage in each of the bulbs.  We discussed the possibility of drain removal today.  Given recent hospitalization and history of seromas, patient wanted to keep drains in a little bit longer.  Discussed with the patient that if drains continue to have low output, she can have them removed later this week or early next week.  Patient expressed understanding.  Discussed cultures and sensitivities with the patient.  I discussed with her that it appears the only oral antibiotic that is sensitive to the E. coli that her culture grew out is the Bactrim .  We did discuss  discontinuing the Bactrim  and monitoring versus continuing.  Patient states that she would like to continue on the Bactrim .  I discussed with her that if she has itchiness again she should discontinue the Bactrim , I discussed with her that if she develops any sensation of her throat closing, difficulty breathing or any other symptoms she needs to go to the emergency room.  Patient expressed understanding.  Discussed with the patient to continue with compression at all times and avoid vigorous activities.  I discussed with her she may apply Vaseline to her incisions throughout daily.  Patient expressed understanding.  Patient to follow back up next week.  Instructed her to call if she has any questions or concerns about anything.

## 2024-10-01 ENCOUNTER — Encounter: Admitting: Physician Assistant

## 2024-10-06 ENCOUNTER — Ambulatory Visit: Admitting: Student

## 2024-10-06 VITALS — BP 121/83 | HR 73

## 2024-10-06 DIAGNOSIS — Z9889 Other specified postprocedural states: Secondary | ICD-10-CM

## 2024-10-06 NOTE — Progress Notes (Signed)
 Patient is a 28 year old female who recently had bilateral breast reduction with amputation technique with Dr. Lowery on 08/21/2024.  She was recently hospitalized for postoperative infection.  Patient had bilateral drains placed on 09/24/2024.  Patient presents to the clinic today for follow-up.   Patient was last seen in the clinic on 09/29/2024.  At this visit, patient was overall doing well.  On exam, breasts are overall soft and symmetric.  NAC's were healing well.  Incisions were intact and healing.  Drains were in place.  Drains were left in place and patient was to continue on Bactrim .  Today, patient presents with her mother at bedside.  She reports that she has been doing well.  She states that she has been taking the Bactrim  without any further issue.  She denies any fevers or chills.  She does report her drains have been putting out about 7.5 cc per 24 hours since her appointment last week.  She does reports she felt like she felt some drainage underneath her right breast.  She otherwise denies any issues or concerns.  Chaperone present on exam.  On exam, patient is sitting upright in no acute distress.  Breasts are soft and symmetric.  There is no overlying erythema, no obvious fluid collection on exam.  Breasts appear to continue to soften up.  NAC's appear to be healing well.  Incisions are well-healed with no wounds noted.  No active drainage noted on exam.  The skin near the inframammary incision of the right breast does appear to be irritated.  Drains are in place and functioning bilaterally with minimal serous drainage in each of the bulbs.  Drains were removed without any difficulty.  Patient tolerated well.  Discussed with the patient that her drain sites may drain over the next few days.  Discussed with her to place gauze and tape over the drain sites daily, or as needed if saturated.  Discussed with her she may apply Vaseline to the drain sites starting on Wednesday or Thursday.   Patient expressed understanding.  Recommended that patient keep her skin clean and dry.  Discussed that she may clean her skin with Vashe as well.  Discussed with her she may continue with Vaseline to her NAC's and to her incisions.  Patient expressed understanding.  Discussed with the patient the importance of continuing with compression at all times.  Patient to follow back up next week.  I instructed her to call in the meantime if she has any questions or concerns about anything.

## 2024-10-15 ENCOUNTER — Ambulatory Visit: Admitting: Student

## 2024-10-15 VITALS — BP 124/85 | HR 74

## 2024-10-15 DIAGNOSIS — Z9889 Other specified postprocedural states: Secondary | ICD-10-CM

## 2024-10-15 MED ORDER — NYSTATIN 100000 UNIT/GM EX CREA: 1.0000 | TOPICAL_CREAM | Freq: Two times a day (BID) | CUTANEOUS | 0 refills | Status: AC

## 2024-10-15 NOTE — Progress Notes (Signed)
 Patient is a 29 year old female who recently had bilateral breast reduction with amputation technique with Dr. Lowery on 08/21/2024.  She was recently hospitalized for postoperative infection.  Patient had bilateral drains placed on 09/24/2024.  Patient presents to the clinic today for follow-up.    Patient was last seen in the clinic on 10/06/2024.  At this visit, patient was doing well.  Her drains had only been putting out 7.5 cc per 24-hour period.  On exam, breasts were soft and symmetric.  NAC's were healing well.  Incisions were well-healed and no wounds were noted.  Drains were removed without difficulty.  Recommended that patient keep her skin clean and dry and discussed that she can clean her skin with Vashe as well.  Recommended Vaseline to her any season throughout her incisions.  She expressed understanding.  Today, patient presents with her mother at bedside.  She reports that she is doing well.  She states that the area of irritation/redness is still persisting to her skin underneath the right inframammary incision.  She denies this on the left side.  She states that her breasts still feel little bit full.  She denies any drainage from either of her breast.  She does not report any increased pain.  She does not report any fevers or chills.  Chaperone present on exam.  On exam, patient sitting upright in no acute distress.  Breasts are overall soft and fairly symmetric.  Left breast is slightly larger than the right.  There is no overlying erythema.  There is no obvious fluid collection palpated on exam.  NAC's appear to be healing well.  Pigment does appear to be returning to the NAC's.  Incisions appear to be healing well.  There still is irritation noted to the skin underneath the inframammary incision of the right breast.  There are otherwise no signs of infection on exam.  Discussed with the patient that she may have a little bit of intertrigo going on to the skin just beneath her  right inframammary incision.  Recommended that she apply nystatin  cream to this area.  Discussed with her to keep the area clean and dry.  She expressed understanding.  Discussed with the patient to continue with compression at all times and avoid strenuous activities.  Recommended that she continue to apply Vaseline to her any season to the incisions throughout.  Patient expressed understanding.  Patient to follow back up in about 2 to 3 weeks.  I instructed her to closely monitor her breast.  I discussed with her that if they begin to feel more fall or if she develops any other concerning symptoms or has any questions she should call us .  She expressed understanding.  Pictures were obtained of the patient and placed in the chart with the patient's or guardian's permission.

## 2024-10-28 ENCOUNTER — Encounter: Payer: Self-pay | Admitting: Plastic Surgery

## 2024-10-28 ENCOUNTER — Ambulatory Visit: Admitting: Plastic Surgery

## 2024-10-28 VITALS — BP 130/83 | HR 86

## 2024-10-28 DIAGNOSIS — Z9889 Other specified postprocedural states: Secondary | ICD-10-CM

## 2024-10-28 DIAGNOSIS — N62 Hypertrophy of breast: Secondary | ICD-10-CM

## 2024-10-28 NOTE — Progress Notes (Signed)
" ° °  Subjective:    Patient ID: Sandra Wilkerson, female    DOB: Mar 03, 1996, 29 y.o.   MRN: [id number]  The patient is a 29 year old female here for follow-up after undergoing bilateral breast reduction.  She had over 1300 g removed from both breast.  She is still a little on the large side but she is very pleased and understands how much was taken off.  She had a little bit of loss of pigment on the nipple areolas.  She is pleased with her progress.  She would like to give it some more time and wait to see if the pigment returns      Review of Systems  Constitutional: Negative.   Eyes: Negative.   Respiratory: Negative.    Cardiovascular: Negative.   Gastrointestinal: Negative.   Endocrine: Negative.   Genitourinary: Negative.   Musculoskeletal: Negative.        Objective:   Physical Exam Constitutional:      Appearance: Normal appearance.  HENT:     Head: Atraumatic.  Cardiovascular:     Rate and Rhythm: Normal rate.     Pulses: Normal pulses.  Pulmonary:     Effort: Pulmonary effort is normal.  Skin:    General: Skin is warm.     Capillary Refill: Capillary refill takes less than 2 seconds.  Neurological:     Mental Status: She is alert.  Psychiatric:        Mood and Affect: Mood normal.        Behavior: Behavior normal.        Thought Content: Thought content normal.        Assessment & Plan:     ICD-10-CM   1. Symptomatic mammary hypertrophy  N62       Pictures were obtained of the patient and placed in the chart with the patient's or guardian's permission. Continue light massage and sports bra and we will plan to see her back in the next few months.  She may need a little bit of tattooing to get the pigment back in the areola and that something we could do for her.  "

## 2024-11-03 ENCOUNTER — Ambulatory Visit: Admitting: Cardiology

## 2024-11-10 ENCOUNTER — Ambulatory Visit: Admitting: Cardiology

## 2024-11-20 ENCOUNTER — Ambulatory Visit: Admitting: Cardiology

## 2025-02-27 ENCOUNTER — Ambulatory Visit: Admitting: Plastic Surgery
